# Patient Record
Sex: Female | Born: 1960 | Race: White
Health system: Southern US, Academic
[De-identification: ages and names within clinical notes are randomized; demographics above are authoritative.]

## PROBLEM LIST (undated history)

## (undated) ENCOUNTER — Emergency Department (HOSPITAL_COMMUNITY): Admission: EM

## (undated) DIAGNOSIS — J432 Centrilobular emphysema: Secondary | ICD-10-CM

## (undated) DIAGNOSIS — J45909 Unspecified asthma, uncomplicated: Secondary | ICD-10-CM

## (undated) DIAGNOSIS — E119 Type 2 diabetes mellitus without complications: Secondary | ICD-10-CM

## (undated) DIAGNOSIS — R0602 Shortness of breath: Secondary | ICD-10-CM

## (undated) DIAGNOSIS — E039 Hypothyroidism, unspecified: Secondary | ICD-10-CM

## (undated) DIAGNOSIS — D069 Carcinoma in situ of cervix, unspecified: Secondary | ICD-10-CM

## (undated) DIAGNOSIS — I10 Essential (primary) hypertension: Secondary | ICD-10-CM

## (undated) DIAGNOSIS — J449 Chronic obstructive pulmonary disease, unspecified: Secondary | ICD-10-CM

## (undated) DIAGNOSIS — G4733 Obstructive sleep apnea (adult) (pediatric): Secondary | ICD-10-CM

## (undated) DIAGNOSIS — J9611 Chronic respiratory failure with hypoxia: Secondary | ICD-10-CM

## (undated) DIAGNOSIS — IMO0001 Reserved for inherently not codable concepts without codable children: Secondary | ICD-10-CM

## (undated) DIAGNOSIS — G709 Myoneural disorder, unspecified: Secondary | ICD-10-CM

## (undated) DIAGNOSIS — Z972 Presence of dental prosthetic device (complete) (partial): Secondary | ICD-10-CM

## (undated) DIAGNOSIS — F419 Anxiety disorder, unspecified: Secondary | ICD-10-CM

## (undated) DIAGNOSIS — K219 Gastro-esophageal reflux disease without esophagitis: Secondary | ICD-10-CM

## (undated) DIAGNOSIS — G4730 Sleep apnea, unspecified: Secondary | ICD-10-CM

## (undated) HISTORY — PX: NECK SURGERY: SHX720

## (undated) HISTORY — PX: HX PELVIC LAPAROSCOPY: SHX162

## (undated) HISTORY — DX: Hypothyroidism, unspecified: E03.9

## (undated) HISTORY — DX: Carcinoma in situ of cervix, unspecified: D06.9

## (undated) HISTORY — PX: HX TONSILLECTOMY: SHX27

## (undated) HISTORY — DX: Centrilobular emphysema: J43.2

## (undated) HISTORY — DX: Essential (primary) hypertension: I10

## (undated) HISTORY — DX: Unspecified asthma, uncomplicated: J45.909

## (undated) HISTORY — PX: HX HYSTERECTOMY: SHX81

## (undated) HISTORY — DX: Chronic respiratory failure with hypoxia: J96.11

## (undated) HISTORY — DX: Type 2 diabetes mellitus without complications: E11.9

## (undated) HISTORY — DX: Type 2 diabetes mellitus without complications (CMS HCC): E11.9

## (undated) HISTORY — DX: Shortness of breath: R06.02

## (undated) HISTORY — PX: TUBOPLASTY / TUBOTUBAL ANASTOMOSIS: SUR1392

## (undated) HISTORY — DX: Obstructive sleep apnea (adult) (pediatric): G47.33

## (undated) HISTORY — PX: ULNAR NERVE REPAIR: SHX2594

## (undated) HISTORY — PX: HX TUBAL LIGATION: SHX77

## (undated) HISTORY — PX: LEG SURGERY: SHX1003

## (undated) HISTORY — PX: TUBAL LIGATION: SHX77

## (undated) HISTORY — PX: TONSILLECTOMY: SUR1361

## (undated) HISTORY — DX: Anxiety disorder, unspecified: F41.9

## (undated) HISTORY — PX: OTHER SURGICAL HISTORY: SHX169

---

## 2000-10-26 ENCOUNTER — Other Ambulatory Visit: Admission: RE | Admit: 2000-10-26 | Discharge: 2000-10-26 | Payer: Self-pay | Admitting: Family Medicine

## 2011-06-01 DIAGNOSIS — Q828 Other specified congenital malformations of skin: Secondary | ICD-10-CM | POA: Insufficient documentation

## 2011-06-08 DIAGNOSIS — N812 Incomplete uterovaginal prolapse: Secondary | ICD-10-CM | POA: Insufficient documentation

## 2011-06-11 DIAGNOSIS — R32 Unspecified urinary incontinence: Secondary | ICD-10-CM | POA: Insufficient documentation

## 2011-08-20 DIAGNOSIS — G5620 Lesion of ulnar nerve, unspecified upper limb: Secondary | ICD-10-CM | POA: Insufficient documentation

## 2013-08-13 DIAGNOSIS — Z87891 Personal history of nicotine dependence: Secondary | ICD-10-CM | POA: Insufficient documentation

## 2013-08-13 DIAGNOSIS — M19079 Primary osteoarthritis, unspecified ankle and foot: Secondary | ICD-10-CM | POA: Insufficient documentation

## 2013-08-24 ENCOUNTER — Ambulatory Visit: Payer: Self-pay | Admitting: Orthopaedic Surgery

## 2013-09-11 ENCOUNTER — Ambulatory Visit: Payer: Self-pay | Admitting: Orthopaedic Surgery

## 2013-11-26 ENCOUNTER — Ambulatory Visit: Payer: Self-pay | Admitting: Family Medicine

## 2014-01-21 DIAGNOSIS — M86679 Other chronic osteomyelitis, unspecified ankle and foot: Secondary | ICD-10-CM | POA: Insufficient documentation

## 2014-04-02 ENCOUNTER — Emergency Department: Payer: Self-pay | Admitting: Emergency Medicine

## 2014-04-02 LAB — BASIC METABOLIC PANEL
Anion Gap: 5 — ABNORMAL LOW (ref 7–16)
BUN: 14 mg/dL (ref 7–18)
CHLORIDE: 107 mmol/L (ref 98–107)
CO2: 32 mmol/L (ref 21–32)
Calcium, Total: 8.7 mg/dL (ref 8.5–10.1)
Creatinine: 0.93 mg/dL (ref 0.60–1.30)
EGFR (African American): >60
GLUCOSE: 118 mg/dL — AB (ref 65–99)
OSMOLALITY: 288 (ref 275–301)
POTASSIUM: 3.8 mmol/L (ref 3.5–5.1)
Sodium: 144 mmol/L (ref 136–145)

## 2014-04-02 LAB — PRO B NATRIURETIC PEPTIDE: B-TYPE NATIURETIC PEPTID: 67 pg/mL (ref 0–125)

## 2014-04-02 LAB — CBC
HCT: 36.9 % (ref 35.0–47.0)
HGB: 12 g/dL (ref 12.0–16.0)
MCH: 28.1 pg (ref 26.0–34.0)
MCHC: 32.6 g/dL (ref 32.0–36.0)
MCV: 87 fL (ref 80–100)
Platelet: 154 10*3/uL (ref 150–440)
RBC: 4.27 10*6/uL (ref 3.80–5.20)
RDW: 15.8 % — ABNORMAL HIGH (ref 11.5–14.5)
WBC: 8.9 10*3/uL (ref 3.6–11.0)

## 2014-04-02 LAB — TROPONIN I: Troponin-I: <0.02 ng/mL

## 2014-04-22 DIAGNOSIS — J029 Acute pharyngitis, unspecified: Secondary | ICD-10-CM | POA: Insufficient documentation

## 2014-04-22 DIAGNOSIS — R600 Localized edema: Secondary | ICD-10-CM | POA: Insufficient documentation

## 2014-04-22 DIAGNOSIS — Z8669 Personal history of other diseases of the nervous system and sense organs: Secondary | ICD-10-CM | POA: Insufficient documentation

## 2014-04-22 DIAGNOSIS — Z01818 Encounter for other preprocedural examination: Secondary | ICD-10-CM | POA: Insufficient documentation

## 2014-04-22 DIAGNOSIS — E669 Obesity, unspecified: Secondary | ICD-10-CM | POA: Insufficient documentation

## 2014-04-22 DIAGNOSIS — J4530 Mild persistent asthma, uncomplicated: Secondary | ICD-10-CM | POA: Insufficient documentation

## 2014-04-22 DIAGNOSIS — Z87891 Personal history of nicotine dependence: Secondary | ICD-10-CM | POA: Insufficient documentation

## 2014-04-23 DIAGNOSIS — Z967 Presence of other bone and tendon implants: Secondary | ICD-10-CM | POA: Insufficient documentation

## 2014-04-23 DIAGNOSIS — M86669 Other chronic osteomyelitis, unspecified tibia and fibula: Secondary | ICD-10-CM | POA: Insufficient documentation

## 2014-04-30 DIAGNOSIS — Z452 Encounter for adjustment and management of vascular access device: Secondary | ICD-10-CM | POA: Insufficient documentation

## 2014-05-23 DIAGNOSIS — R4182 Altered mental status, unspecified: Secondary | ICD-10-CM | POA: Insufficient documentation

## 2014-05-23 DIAGNOSIS — N179 Acute kidney failure, unspecified: Secondary | ICD-10-CM | POA: Insufficient documentation

## 2014-07-09 DIAGNOSIS — M25512 Pain in left shoulder: Secondary | ICD-10-CM | POA: Insufficient documentation

## 2014-08-05 DIAGNOSIS — M7541 Impingement syndrome of right shoulder: Secondary | ICD-10-CM | POA: Insufficient documentation

## 2014-08-05 DIAGNOSIS — M7542 Impingement syndrome of left shoulder: Secondary | ICD-10-CM

## 2014-08-09 ENCOUNTER — Ambulatory Visit: Payer: Self-pay

## 2014-08-16 DIAGNOSIS — G5622 Lesion of ulnar nerve, left upper limb: Secondary | ICD-10-CM | POA: Insufficient documentation

## 2014-08-19 DIAGNOSIS — M19279 Secondary osteoarthritis, unspecified ankle and foot: Secondary | ICD-10-CM | POA: Insufficient documentation

## 2014-09-30 ENCOUNTER — Ambulatory Visit: Payer: Self-pay

## 2014-11-11 DIAGNOSIS — R339 Retention of urine, unspecified: Secondary | ICD-10-CM | POA: Insufficient documentation

## 2014-11-11 DIAGNOSIS — N816 Rectocele: Secondary | ICD-10-CM | POA: Insufficient documentation

## 2014-11-11 DIAGNOSIS — N3946 Mixed incontinence: Secondary | ICD-10-CM | POA: Insufficient documentation

## 2014-11-26 ENCOUNTER — Ambulatory Visit: Payer: Medicaid Other | Attending: Specialist

## 2014-11-26 DIAGNOSIS — G4733 Obstructive sleep apnea (adult) (pediatric): Secondary | ICD-10-CM | POA: Insufficient documentation

## 2014-11-26 DIAGNOSIS — G4761 Periodic limb movement disorder: Secondary | ICD-10-CM | POA: Insufficient documentation

## 2014-11-26 DIAGNOSIS — E669 Obesity, unspecified: Secondary | ICD-10-CM | POA: Insufficient documentation

## 2014-11-26 DIAGNOSIS — R0683 Snoring: Secondary | ICD-10-CM | POA: Diagnosis present

## 2014-11-29 ENCOUNTER — Other Ambulatory Visit: Payer: Self-pay | Admitting: Family Medicine

## 2014-11-29 DIAGNOSIS — R748 Abnormal levels of other serum enzymes: Secondary | ICD-10-CM

## 2014-12-05 ENCOUNTER — Ambulatory Visit
Admission: RE | Admit: 2014-12-05 | Discharge: 2014-12-05 | Disposition: A | Discharge status: Home / Self Care | Payer: Medicaid Other | Source: Ambulatory Visit | Attending: Family Medicine | Admitting: Family Medicine

## 2014-12-05 DIAGNOSIS — K760 Fatty (change of) liver, not elsewhere classified: Secondary | ICD-10-CM | POA: Insufficient documentation

## 2014-12-05 DIAGNOSIS — R748 Abnormal levels of other serum enzymes: Secondary | ICD-10-CM | POA: Diagnosis present

## 2014-12-18 ENCOUNTER — Telehealth: Payer: Self-pay | Admitting: Family Medicine

## 2014-12-18 NOTE — Telephone Encounter (Addendum)
Pt called stating that she had an U/S done over a week ago and that she hadn't heard anything back about them. Pt was infomed that the nurse was in with a pt, but would be in touch when they had a moment. Pt stated that she really needed someone to call her back asap; she wanted to know the results before heading out to the beach.

## 2014-12-18 NOTE — Telephone Encounter (Deleted)
See below:  when they had a moment. Pt stated that she really needed someone to call her back asap; she wanted to know the results before heading out to the beach.

## 2014-12-19 NOTE — Telephone Encounter (Signed)
Left message for her to call back after lunch. U/S shoes Fatty Liver and treatment for this is diet and wt loss. Will discuss this and only this when patient calls. Anything else will need to be discussed at follow up appt.per Dr. Luan Pulling.

## 2014-12-20 NOTE — Telephone Encounter (Signed)
No answer on home phone.

## 2014-12-23 NOTE — Telephone Encounter (Signed)
Pt called for [place] results. Reported no one had called her since Jamie's initial call. Informed pt of the telephone encounter documentation of attempts to call. Reviewed [place] results. Pt has fatty liver disease. Non-life threatening. We will continue to monitor. Recommend diet and weight loss to help reduce abdominal obesity. Recommend water aerobics due to pt poor walking due to orthopedic condition.

## 2014-12-23 NOTE — Telephone Encounter (Signed)
Patient called back upset and I gave this call to Amy Krebs.

## 2014-12-30 ENCOUNTER — Ambulatory Visit: Payer: Medicaid Other | Admitting: Family Medicine

## 2015-01-14 ENCOUNTER — Other Ambulatory Visit: Payer: Self-pay | Admitting: Family Medicine

## 2015-01-14 DIAGNOSIS — M545 Low back pain, unspecified: Secondary | ICD-10-CM

## 2015-01-14 DIAGNOSIS — G8929 Other chronic pain: Secondary | ICD-10-CM

## 2015-01-14 MED ORDER — BACLOFEN 10 MG PO TABS: 10.0000 mg | ORAL_TABLET | Freq: Two times a day (BID) | ORAL | Status: DC

## 2015-01-25 ENCOUNTER — Other Ambulatory Visit: Payer: Self-pay | Admitting: Family Medicine

## 2015-02-06 ENCOUNTER — Telehealth: Payer: Self-pay | Admitting: Family Medicine

## 2015-02-06 NOTE — Telephone Encounter (Signed)
Pt needs to get an appt for colonoscopy.  Please call

## 2015-02-07 ENCOUNTER — Other Ambulatory Visit: Payer: Self-pay

## 2015-02-07 DIAGNOSIS — Z1211 Encounter for screening for malignant neoplasm of colon: Secondary | ICD-10-CM

## 2015-02-07 NOTE — Telephone Encounter (Signed)
Sent to Dr. Allen Norris as she has no preference. [facility]

## 2015-02-07 NOTE — Telephone Encounter (Signed)
Is it ok to order this or does she need to be seen? I left a message asking if there is a certain GI Doc she would like and also told her that Dr. Luan Pulling is out of town and I will ask next week if I can order this now or at next appt. [facility]

## 2015-02-07 NOTE — Telephone Encounter (Signed)
Sure. We can order a colonscopy for her. We just need to know who she wants to see.

## 2015-02-10 ENCOUNTER — Other Ambulatory Visit: Payer: Self-pay

## 2015-02-10 MED ORDER — ALBUTEROL SULFATE HFA 108 (90 BASE) MCG/ACT IN AERS: 2.0000 | INHALATION_SPRAY | Freq: Four times a day (QID) | RESPIRATORY_TRACT | Status: DC | PRN

## 2015-02-10 NOTE — Telephone Encounter (Signed)
Ok-jh 

## 2015-02-17 ENCOUNTER — Telehealth: Payer: Self-pay | Admitting: Gastroenterology

## 2015-02-17 ENCOUNTER — Other Ambulatory Visit: Payer: Self-pay

## 2015-02-17 NOTE — Telephone Encounter (Signed)
Gastroenterology Pre-Procedure Review  Request Date: 02-21-2015 Requesting Physician: Dr. Luan Pulling  PATIENT REVIEW QUESTIONS: The patient responded to the following health history questions as indicated:    1. Are you having any GI issues? no 2. Do you have a personal history of Polyps? yes ( ) 3. Do you have a family history of Colon Cancer or Polyps? yes (Polyps Mom and dad) 4. Diabetes Mellitus? no 5. Joint replacements in the past 12 months?no 6. Major health problems in the past 3 months?no 7. Any artificial heart valves, MVP, or defibrillator?no    MEDICATIONS & ALLERGIES:    Patient reports the following regarding taking any anticoagulation/antiplatelet therapy:   Plavix, Coumadin, Eliquis, Xarelto, Lovenox, Pradaxa, Brilinta, or Effient? no Aspirin? no  Patient confirms/reports the following medications:  Current Outpatient Prescriptions  Medication Sig Dispense Refill   albuterol (PROAIR HFA) 108 (90 BASE) MCG/ACT inhaler Inhale 2 puffs into the lungs every 6 (six) hours as needed for wheezing or shortness of breath. 1 Inhaler 11   baclofen (LIORESAL) 10 MG tablet Take 1 tablet (10 mg total) by mouth 2 (two) times daily. Take 1 tablet by mouth twice daily and 2 at bedtime. 120 each 0   citalopram (CELEXA) 40 MG tablet take 1 tablet by mouth once daily 30 tablet 2   gabapentin (NEURONTIN) 400 MG capsule Take by mouth.     levothyroxine (SYNTHROID, LEVOTHROID) 175 MCG tablet Take by mouth.     loratadine (CLARITIN) 10 MG tablet Take by mouth.     losartan (COZAAR) 50 MG tablet Take by mouth.     tiotropium (SPIRIVA HANDIHALER) 18 MCG inhalation capsule Place into inhaler and inhale.     triamcinolone cream (KENALOG) 0.1 % TRIAMCINOLONE ACETONIDE, 0.1% (External Cream)  1 (one) application application twice a day for 0 days  Quantity: 30;  Refills: 3   Ordered :25-Jun-2014  Theresia Majors CMA;  Started 25-Jun-2014 Active     No current facility-administered  medications for this visit.    Patient confirms/reports the following allergies:  Allergies  Allergen Reactions   Clindamycin Hives   2,4-D Dimethylamine (Amisol) Other (See Comments)    No orders of the defined types were placed in this encounter.    AUTHORIZATION INFORMATION Primary Insurance: 1D#: Group #:  Secondary Insurance: 1D#: Group #:  SCHEDULE INFORMATION: Date: 02-21-2015 Time: Location:[facility]

## 2015-02-20 NOTE — Discharge Instructions (Signed)

## 2015-02-21 ENCOUNTER — Ambulatory Visit: Payer: Medicaid Other | Admitting: Anesthesiology

## 2015-02-21 ENCOUNTER — Ambulatory Visit
Admission: RE | Admit: 2015-02-21 | Discharge: 2015-02-21 | Disposition: A | Discharge status: Home / Self Care | Payer: Medicaid Other | Source: Ambulatory Visit | Attending: Gastroenterology | Admitting: Gastroenterology

## 2015-02-21 ENCOUNTER — Other Ambulatory Visit: Payer: Self-pay | Admitting: Gastroenterology

## 2015-02-21 ENCOUNTER — Encounter
Admission: RE | Disposition: A | Discharge status: Home / Self Care | Payer: Self-pay | Source: Ambulatory Visit | Attending: Gastroenterology

## 2015-02-21 DIAGNOSIS — Z860100 Personal history of colon polyps, unspecified: Secondary | ICD-10-CM | POA: Insufficient documentation

## 2015-02-21 DIAGNOSIS — Z888 Allergy status to other drugs, medicaments and biological substances status: Secondary | ICD-10-CM | POA: Insufficient documentation

## 2015-02-21 DIAGNOSIS — G4730 Sleep apnea, unspecified: Secondary | ICD-10-CM | POA: Diagnosis not present

## 2015-02-21 DIAGNOSIS — E039 Hypothyroidism, unspecified: Secondary | ICD-10-CM | POA: Diagnosis not present

## 2015-02-21 DIAGNOSIS — K640 First degree hemorrhoids: Secondary | ICD-10-CM | POA: Insufficient documentation

## 2015-02-21 DIAGNOSIS — Z791 Long term (current) use of non-steroidal anti-inflammatories (NSAID): Secondary | ICD-10-CM | POA: Diagnosis not present

## 2015-02-21 DIAGNOSIS — Z79899 Other long term (current) drug therapy: Secondary | ICD-10-CM | POA: Insufficient documentation

## 2015-02-21 DIAGNOSIS — K219 Gastro-esophageal reflux disease without esophagitis: Secondary | ICD-10-CM | POA: Insufficient documentation

## 2015-02-21 DIAGNOSIS — K621 Rectal polyp: Secondary | ICD-10-CM | POA: Diagnosis not present

## 2015-02-21 DIAGNOSIS — Z881 Allergy status to other antibiotic agents status: Secondary | ICD-10-CM | POA: Diagnosis not present

## 2015-02-21 DIAGNOSIS — I10 Essential (primary) hypertension: Secondary | ICD-10-CM | POA: Insufficient documentation

## 2015-02-21 DIAGNOSIS — D125 Benign neoplasm of sigmoid colon: Secondary | ICD-10-CM | POA: Insufficient documentation

## 2015-02-21 DIAGNOSIS — J45909 Unspecified asthma, uncomplicated: Secondary | ICD-10-CM | POA: Diagnosis not present

## 2015-02-21 DIAGNOSIS — K635 Polyp of colon: Secondary | ICD-10-CM | POA: Diagnosis not present

## 2015-02-21 DIAGNOSIS — Z87891 Personal history of nicotine dependence: Secondary | ICD-10-CM | POA: Insufficient documentation

## 2015-02-21 HISTORY — DX: Hypothyroidism, unspecified: E03.9

## 2015-02-21 HISTORY — DX: Myoneural disorder, unspecified: G70.9

## 2015-02-21 HISTORY — PX: POLYPECTOMY: SHX5525

## 2015-02-21 HISTORY — DX: Unspecified asthma, uncomplicated: J45.909

## 2015-02-21 HISTORY — DX: Gastro-esophageal reflux disease without esophagitis: K21.9

## 2015-02-21 HISTORY — PX: COLONOSCOPY WITH PROPOFOL: SHX5780

## 2015-02-21 HISTORY — DX: Reserved for inherently not codable concepts without codable children: IMO0001

## 2015-02-21 HISTORY — DX: Essential (primary) hypertension: I10

## 2015-02-21 HISTORY — DX: Sleep apnea, unspecified: G47.30

## 2015-02-21 SURGERY — COLONOSCOPY WITH PROPOFOL
Anesthesia: Monitor Anesthesia Care | Wound class: Contaminated

## 2015-02-21 MED ORDER — PROPOFOL 10 MG/ML IV BOLUS
INTRAVENOUS | Status: DC | PRN
  Administered 2015-02-21 (×5): 20 mg via INTRAVENOUS

## 2015-02-21 MED ORDER — LIDOCAINE HCL (CARDIAC) 20 MG/ML IV SOLN
INTRAVENOUS | Status: DC | PRN
  Administered 2015-02-21: 30 mg via INTRAVENOUS

## 2015-02-21 MED ORDER — SIMETHICONE 40 MG/0.6ML PO SUSP
ORAL | Status: DC | PRN
  Administered 2015-02-21: 10:00:00

## 2015-02-21 MED ORDER — ONDANSETRON HCL 4 MG/2ML IJ SOLN: 4.0000 mg | Freq: Once | INTRAMUSCULAR | Status: DC | PRN

## 2015-02-21 MED ORDER — OXYCODONE HCL 5 MG PO TABS: 5.0000 mg | ORAL_TABLET | Freq: Once | ORAL | Status: DC | PRN

## 2015-02-21 MED ORDER — LACTATED RINGERS IV SOLN
INTRAVENOUS | Status: DC
  Administered 2015-02-21 (×2): via INTRAVENOUS

## 2015-02-21 MED ORDER — OXYCODONE HCL 5 MG/5ML PO SOLN: 5.0000 mg | Freq: Once | ORAL | Status: DC | PRN

## 2015-02-21 SURGICAL SUPPLY — 28 items
CANISTER SUCT 1200ML W/VALVE (MISCELLANEOUS) ×3 IMPLANT
FCP ESCP3.2XJMB 240X2.8X (MISCELLANEOUS)
FORCEPS BIOP RAD 4 LRG CAP 4 (CUTTING FORCEPS) IMPLANT
FORCEPS BIOP RJ4 240 W/NDL (MISCELLANEOUS)
FORCEPS ESCP3.2XJMB 240X2.8X (MISCELLANEOUS) IMPLANT
GOWN CVR UNV OPN BCK APRN NK (MISCELLANEOUS) ×4 IMPLANT
GOWN ISOL THUMB LOOP REG UNIV (MISCELLANEOUS) ×6
HEMOCLIP INSTINCT (CLIP) IMPLANT
INJECTOR VARIJECT VIN23 (MISCELLANEOUS) IMPLANT
KIT CO2 TUBING (TUBING) IMPLANT
KIT DEFENDO VALVE AND CONN (KITS) IMPLANT
KIT ENDO PROCEDURE OLY (KITS) ×3 IMPLANT
LIGATOR MULTIBAND 6SHOOTER MBL (MISCELLANEOUS) IMPLANT
MARKER SPOT ENDO TATTOO 5ML (MISCELLANEOUS) IMPLANT
PAD GROUND ADULT SPLIT (MISCELLANEOUS) IMPLANT
SNARE SHORT THROW 13M SML OVAL (MISCELLANEOUS) ×1 IMPLANT
SNARE SHORT THROW 30M LRG OVAL (MISCELLANEOUS) IMPLANT
SPOT EX ENDOSCOPIC TATTOO (MISCELLANEOUS)
SUCTION POLY TRAP 4CHAMBER (MISCELLANEOUS) IMPLANT
TRAP SUCTION POLY (MISCELLANEOUS) ×1 IMPLANT
TUBING CONN 6MMX3.1M (TUBING)
TUBING SUCTION CONN 0.25 STRL (TUBING) IMPLANT
UNDERPAD 30X60 958B10 (PK) (MISCELLANEOUS) IMPLANT
VALVE BIOPSY ENDO (VALVE) IMPLANT
VARIJECT INJECTOR VIN23 (MISCELLANEOUS)
WATER AUXILLARY (MISCELLANEOUS) IMPLANT
WATER STERILE IRR 250ML POUR (IV SOLUTION) ×3 IMPLANT
WATER STERILE IRR 500ML POUR (IV SOLUTION) IMPLANT

## 2015-02-21 NOTE — Op Note (Addendum)
[facility] Gastroenterology Patient Name: Erin Potter Procedure Date: 02/21/2015 10:15 AM MRN: [id number] Account #: [id number] Date of Birth: March 07, 1961 Admit Type: Outpatient Age: 54 Room: [facility] OR ROOM 01 Gender: Female Note Status: Supervisor Override Procedure:         Colonoscopy Indications:       High risk colon cancer surveillance: Personal history of                     colonic polyps Providers:         [name], MD Medicines:         Propofol per Anesthesia Complications:     No immediate complications. Procedure:         Pre-Anesthesia Assessment:                    - Prior to the procedure, a History and Physical was                     performed, and patient medications and allergies were                     reviewed. The patient's tolerance of previous anesthesia                     was also reviewed. The risks and benefits of the procedure                     and the sedation options and risks were discussed with the                     patient. All questions were answered, and informed consent                     was obtained. Prior Anticoagulants: The patient has taken                     no previous anticoagulant or antiplatelet agents. ASA                     Grade Assessment: II - A patient with mild systemic                     disease. After reviewing the risks and benefits, the                     patient was deemed in satisfactory condition to undergo                     the procedure.                    After obtaining informed consent, the colonoscope was                     passed under direct vision. Throughout the procedure, the                     patient's blood pressure, pulse, and oxygen saturations                     were monitored continuously. The Olympus CF H180AL                     colonoscope (S#: [id number]) was introduced through the anus  and advanced to the the cecum, identified by appendiceal                      orifice and ileocecal valve. The colonoscopy was performed                     without difficulty. The patient tolerated the procedure                     well. The quality of the bowel preparation was excellent. Findings:      The perianal and digital rectal examinations were normal.      A 7 mm polyp was found in the sigmoid colon. The polyp was sessile. The       polyp was removed with a cold snare. Resection and retrieval were       complete.      A 3 mm polyp was found in the rectum. The polyp was sessile. The polyp       was removed with a cold snare. Resection and retrieval were complete.      Non-bleeding internal hemorrhoids were found during retroflexion. The       hemorrhoids were Grade I (internal hemorrhoids that do not prolapse). Impression:        - One 7 mm polyp in the sigmoid colon. Resected and                     retrieved.                    - One 3 mm polyp in the rectum. Resected and retrieved.                    - Non-bleeding internal hemorrhoids. Recommendation:    - Await pathology results.                    - Repeat colonoscopy in 3 years for surveillance. Procedure Code(s): --- Professional ---                    [id number], Colonoscopy, flexible; with removal of tumor(s),                     polyp(s), or other lesion(s) by snare technique Diagnosis Code(s): --- Professional ---                    Z86.010, Personal history of colonic polyps                    D12.5, Benign neoplasm of sigmoid colon                    K62.1, Rectal polyp CPT copyright 2014 American Medical Association. All rights reserved. The codes documented in this report are preliminary and upon coder review may  be revised to meet current compliance requirements. [name], MD 02/21/2015 10:33:49 AM This report has been signed electronically. Number of Addenda: 0 Note Initiated On: 02/21/2015 10:15 AM Scope Withdrawal Time: 0 hours 9 minutes 6 seconds  Total  Procedure Duration: 0 hours 12 minutes 38 seconds       [facility]

## 2015-02-21 NOTE — Transfer of Care (Signed)
Immediate Anesthesia Transfer of Care Note  Patient: Erin Potter  Procedure(s) Performed: Procedure(s): COLONOSCOPY WITH PROPOFOL (N/A) POLYPECTOMY  Patient Location: PACU  Anesthesia Type: MAC  Level of Consciousness: awake, alert  and patient cooperative  Airway and Oxygen Therapy: Patient Spontanous Breathing and Patient connected to supplemental oxygen  Post-op Assessment: Post-op Vital signs reviewed, Patient's Cardiovascular Status Stable, Respiratory Function Stable, Patent Airway and No signs of Nausea or vomiting  Post-op Vital Signs: Reviewed and stable  Complications: No apparent anesthesia complications

## 2015-02-21 NOTE — Anesthesia Preprocedure Evaluation (Signed)
Anesthesia Evaluation  Patient identified by MRN, date of birth, ID band Patient awake    Reviewed: Allergy & Precautions, H&P , NPO status   Airway Mallampati: II  TM Distance: >3 FB Neck ROM: full    Dental no notable dental hx.    Pulmonary asthma , sleep apnea and Continuous Positive Airway Pressure Ventilation , former smoker,    Pulmonary exam normal       Cardiovascular hypertension, Normal cardiovascular exam    Neuro/Psych    GI/Hepatic Neg liver ROS, GERD-  Medicated,  Endo/Other  Hypothyroidism   Renal/GU      Musculoskeletal   Abdominal   Peds  Hematology negative hematology ROS (+)   Anesthesia Other Findings   Reproductive/Obstetrics                             Anesthesia Physical Anesthesia Plan  ASA: II  Anesthesia Plan: MAC   Post-op Pain Management:    Induction:   Airway Management Planned:   Additional Equipment:   Intra-op Plan:   Post-operative Plan:   Informed Consent: I have reviewed the patients History and Physical, chart, labs and discussed the procedure including the risks, benefits and alternatives for the proposed anesthesia with the patient or authorized representative who has indicated his/her understanding and acceptance.     Plan Discussed with: CRNA  Anesthesia Plan Comments:         Anesthesia Quick Evaluation

## 2015-02-21 NOTE — Anesthesia Postprocedure Evaluation (Signed)
  Anesthesia Post-op Note  Patient: Erin Potter  Procedure(s) Performed: Procedure(s): COLONOSCOPY WITH PROPOFOL (N/A) POLYPECTOMY  Anesthesia type:MAC  Patient location: PACU  Post pain: Pain level controlled  Post assessment: Post-op Vital signs reviewed, Patient's Cardiovascular Status Stable, Respiratory Function Stable, Patent Airway and No signs of Nausea or vomiting  Post vital signs: Reviewed and stable  Last Vitals:  Filed Vitals:   02/21/15 1036  BP: 142/59  Pulse: 101  Temp: 36.2 C  Resp: 20    Level of consciousness: awake, alert  and patient cooperative  Complications: No apparent anesthesia complications

## 2015-02-21 NOTE — H&P (Signed)
[facility]  [address], [place], [place] [address] Phone: [phone] Fax : [phone]  Primary Care Physician:  Dicky Doe, MD Primary Gastroenterologist:  Dr. Allen Norris  Pre-Procedure History & Physical: HPI:  Erin Potter is a 54 y.o. female is here for an colonoscopy.   Past Medical History  Diagnosis Date  . Hypertension   . Shortness of breath dyspnea   . Neuromuscular disorder     LEFT ARM WEAKNESS-ULNAR NERVE ISSUE  . Hypothyroidism   . Sleep apnea     NO CPAP-ONLY FINISHED HALF TEST  . GERD (gastroesophageal reflux disease)   . Asthma     Past Surgical History  Procedure Laterality Date  . Tonsillectomy    . Tubal ligation    . Neck surgery      EXPLORATORY  . Tubalplasty      LAPAROSCOPY  . Leg surgery Right     Prior to Admission medications   Medication Sig Start Date End Date Taking? Authorizing Provider  albuterol (PROAIR HFA) 108 (90 BASE) MCG/ACT inhaler Inhale 2 puffs into the lungs every 6 (six) hours as needed for wheezing or shortness of breath. 02/10/15  Yes Arlis Porta., MD  amitriptyline (ELAVIL) 25 MG tablet Take 12.5 mg by mouth at bedtime.   Yes Historical Provider, MD  baclofen (LIORESAL) 10 MG tablet Take 1 tablet (10 mg total) by mouth 2 (two) times daily. Take 1 tablet by mouth twice daily and 2 at bedtime. 01/14/15  Yes Amy Overton Mam, NP  benzonatate (TESSALON) 100 MG capsule Take by mouth as needed for cough.   Yes Historical Provider, MD  budesonide-formoterol (SYMBICORT) 160-4.5 MCG/ACT inhaler Inhale 2 puffs into the lungs daily.   Yes Historical Provider, MD  citalopram (CELEXA) 40 MG tablet take 1 tablet by mouth once daily 01/27/15  Yes Arlis Porta., MD  cyanocobalamin 100 MCG tablet Take 100 mcg by mouth daily.   Yes Historical Provider, MD  estrogens, conjugated, (PREMARIN) 0.625 MG tablet Take 0.625 mg by mouth 2 (two) times a week. Take daily for 21 days then do not take for 7  days.   Yes Historical Provider, MD  gabapentin (NEURONTIN) 400 MG capsule Take 400 mg by mouth 3 (three) times daily.  11/28/14  Yes Historical Provider, MD  ipratropium-albuterol (DUONEB) 0.5-2.5 (3) MG/3ML SOLN Take 3 mLs by nebulization every 2 (two) hours as needed.   Yes Historical Provider, MD  levothyroxine (SYNTHROID, LEVOTHROID) 175 MCG tablet Take 175 mcg by mouth. AM 11/28/14  Yes Historical Provider, MD  losartan (COZAAR) 50 MG tablet Take 25 mg by mouth. AM 11/28/14  Yes Historical Provider, MD  meloxicam (MOBIC) 7.5 MG tablet Take 7.5 mg by mouth daily.   Yes Historical Provider, MD  Probiotic Product (PROBIOTIC ADVANCED PO) Take by mouth.   Yes Historical Provider, MD  tiotropium (SPIRIVA HANDIHALER) 18 MCG inhalation capsule Place into inhaler and inhale. AS NEEDED 11/28/14  Yes Historical Provider, MD  triamcinolone cream (KENALOG) 0.1 % TRIAMCINOLONE ACETONIDE, 0.1% (External Cream)  1 (one) application application twice a day for 0 days  Quantity: 30;  Refills: 3   Ordered :25-Jun-2014  Theresia Majors CMA;  Started 25-Jun-2014 Active 06/25/14  Yes Historical Provider, MD    Allergies as of 02/17/2015 - never reviewed  Allergen Reaction Noted  . Clindamycin Hives 12/30/2014  . 2,4-d dimethylamine (amisol) Other (See Comments) 12/30/2014    History reviewed. No pertinent family history.  Social History  Social History  . Marital Status: Single    Spouse Name: N/A  . Number of Children: N/A  . Years of Education: N/A   Occupational History  . Not on file.   Social History Main Topics  . Smoking status: Former Smoker -- 1.50 packs/day for 35 years    Types: Cigarettes    Quit date: 02/24/2014  . Smokeless tobacco: Not on file  . Alcohol Use: No  . Drug Use: No  . Sexual Activity: Not on file   Other Topics Concern  . Not on file   Social History Narrative  . No narrative on file    Review of Systems: See HPI, otherwise negative ROS  Physical Exam: BP  134/74 mmHg  Pulse 93  Temp(Src) 98.1 F (36.7 C) (Temporal)  Resp 16  Ht 5\' 4"  (1.626 m)  Wt 212 lb (96.163 kg)  BMI 36.37 kg/m2  SpO2 96% General:   Alert,  pleasant and cooperative in NAD Head:  Normocephalic and atraumatic. Neck:  Supple; no masses or thyromegaly. Lungs:  Clear throughout to auscultation.    Heart:  Regular rate and rhythm. Abdomen:  Soft, nontender and nondistended. Normal bowel sounds, without guarding, and without rebound.   Neurologic:  Alert and  oriented x4;  grossly normal neurologically.  Impression/Plan: Erin Potter is here for an colonoscopy to be performed for history of colon polyps and family history of polyps.  Risks, benefits, limitations, and alternatives regarding  colonoscopy have been reviewed with the patient.  Questions have been answered.  All parties agreeable.   Ollen Bowl, MD  02/21/2015, 9:28 AM

## 2015-02-24 ENCOUNTER — Encounter: Payer: Self-pay | Admitting: Gastroenterology

## 2015-02-24 ENCOUNTER — Telehealth: Payer: Self-pay

## 2015-02-24 NOTE — Telephone Encounter (Signed)
Patient has called complaining that she has had yeast in mouth for several months and it will not go away. Patient is on inhalers. She is requesting Nystatin be sent to [facility] in [place]

## 2015-02-24 NOTE — Telephone Encounter (Signed)
If this has never been documented before, I would feel better about having an appt to confirm oral thrush. It could be other causes. Please let her know I would be happy to see to treat it. Thank you! AK

## 2015-02-25 NOTE — Telephone Encounter (Signed)
Please call patient and have her be seen by Amy. We have no documentation of Thrush or Yeast so she will need to be seen. [facility]

## 2015-03-03 ENCOUNTER — Encounter: Payer: Self-pay | Admitting: Gastroenterology

## 2015-03-07 ENCOUNTER — Other Ambulatory Visit: Payer: Self-pay | Admitting: Family Medicine

## 2015-03-11 ENCOUNTER — Other Ambulatory Visit: Payer: Self-pay | Admitting: Family Medicine

## 2015-03-11 ENCOUNTER — Telehealth: Payer: Self-pay

## 2015-03-11 MED ORDER — BACLOFEN 10 MG PO TABS: 10.0000 mg | ORAL_TABLET | Freq: Three times a day (TID) | ORAL | Status: DC

## 2015-03-11 NOTE — Telephone Encounter (Signed)
I will work on refill later today.-jh

## 2015-03-11 NOTE — Telephone Encounter (Signed)
Pharmacy called regarding baclofen and got denied by Amy and pt argues with pharmacy since Dr. Luan Pulling is the provider that's why it denied and refill under Dr. Luan Pulling and pharmacy needs our suggestion and I mentioned that weather it's Amy or Dr. Luan Pulling denied it's the same thing from our office please suggest ? Erin Potter

## 2015-03-13 ENCOUNTER — Telehealth: Payer: Self-pay | Admitting: Family Medicine

## 2015-03-13 NOTE — Telephone Encounter (Signed)
R/T call and ask what was wrong with her rx. Pt says her original rx was 1 tab twice daily and 2 at bedtime. According to [employer] patient is correct. Please advise.

## 2015-03-13 NOTE — Telephone Encounter (Signed)
Pt called states that med was called incorrect. Pt call back  # is  [phone] ( Baclofen)

## 2015-03-14 ENCOUNTER — Other Ambulatory Visit: Payer: Self-pay | Admitting: *Deleted

## 2015-03-14 MED ORDER — BACLOFEN 10 MG PO TABS: ORAL_TABLET | ORAL | Status: DC

## 2015-03-14 NOTE — Telephone Encounter (Signed)
Pt notified RX has been adjusted.

## 2015-03-14 NOTE — Telephone Encounter (Signed)
May change Baclofen to 10 mg., 1 twice a day and 2 at bedtime.  #120/3 refills.  Contact pharmacy with these changes-jh

## 2015-03-25 ENCOUNTER — Other Ambulatory Visit: Payer: Self-pay | Admitting: Family Medicine

## 2015-04-09 ENCOUNTER — Other Ambulatory Visit: Payer: Self-pay | Admitting: Family Medicine

## 2015-04-09 MED ORDER — MELOXICAM 15 MG PO TABS: 15.0000 mg | ORAL_TABLET | Freq: Every day | ORAL | Status: DC

## 2015-04-09 NOTE — Telephone Encounter (Signed)
Patient requested from pharmacy 04/04/15.

## 2015-04-15 ENCOUNTER — Other Ambulatory Visit: Payer: Self-pay | Admitting: Family Medicine

## 2015-04-15 MED ORDER — BUDESONIDE-FORMOTEROL FUMARATE 160-4.5 MCG/ACT IN AERO: 2.0000 | INHALATION_SPRAY | Freq: Two times a day (BID) | RESPIRATORY_TRACT | Status: DC

## 2015-04-29 ENCOUNTER — Ambulatory Visit: Payer: Medicaid Other | Admitting: Family Medicine

## 2015-05-01 ENCOUNTER — Other Ambulatory Visit: Payer: Self-pay | Admitting: Family Medicine

## 2015-05-02 MED ORDER — CITALOPRAM HYDROBROMIDE 40 MG PO TABS: 40.0000 mg | ORAL_TABLET | Freq: Every day | ORAL | Status: DC

## 2015-05-28 ENCOUNTER — Encounter: Payer: Self-pay | Admitting: Family Medicine

## 2015-05-28 ENCOUNTER — Ambulatory Visit (INDEPENDENT_AMBULATORY_CARE_PROVIDER_SITE_OTHER): Payer: Medicaid Other | Admitting: Family Medicine

## 2015-05-28 VITALS — BP 110/60 | HR 104 | Temp 99.7°F | Resp 16 | Ht 64.2 in | Wt 228.0 lb

## 2015-05-28 DIAGNOSIS — L309 Dermatitis, unspecified: Secondary | ICD-10-CM | POA: Insufficient documentation

## 2015-05-28 DIAGNOSIS — R5382 Chronic fatigue, unspecified: Secondary | ICD-10-CM | POA: Insufficient documentation

## 2015-05-28 DIAGNOSIS — J432 Centrilobular emphysema: Secondary | ICD-10-CM | POA: Insufficient documentation

## 2015-05-28 DIAGNOSIS — G629 Polyneuropathy, unspecified: Secondary | ICD-10-CM | POA: Insufficient documentation

## 2015-05-28 DIAGNOSIS — J40 Bronchitis, not specified as acute or chronic: Secondary | ICD-10-CM | POA: Diagnosis not present

## 2015-05-28 DIAGNOSIS — F32A Depression, unspecified: Secondary | ICD-10-CM

## 2015-05-28 DIAGNOSIS — F329 Major depressive disorder, single episode, unspecified: Secondary | ICD-10-CM

## 2015-05-28 DIAGNOSIS — K219 Gastro-esophageal reflux disease without esophagitis: Secondary | ICD-10-CM | POA: Diagnosis not present

## 2015-05-28 DIAGNOSIS — M5442 Lumbago with sciatica, left side: Secondary | ICD-10-CM

## 2015-05-28 DIAGNOSIS — J431 Panlobular emphysema: Secondary | ICD-10-CM

## 2015-05-28 DIAGNOSIS — E034 Atrophy of thyroid (acquired): Secondary | ICD-10-CM

## 2015-05-28 DIAGNOSIS — J449 Chronic obstructive pulmonary disease, unspecified: Secondary | ICD-10-CM

## 2015-05-28 DIAGNOSIS — M791 Myalgia, unspecified site: Secondary | ICD-10-CM

## 2015-05-28 DIAGNOSIS — R748 Abnormal levels of other serum enzymes: Secondary | ICD-10-CM

## 2015-05-28 DIAGNOSIS — E038 Other specified hypothyroidism: Secondary | ICD-10-CM | POA: Diagnosis not present

## 2015-05-28 DIAGNOSIS — L299 Pruritus, unspecified: Secondary | ICD-10-CM | POA: Diagnosis not present

## 2015-05-28 DIAGNOSIS — E039 Hypothyroidism, unspecified: Secondary | ICD-10-CM | POA: Insufficient documentation

## 2015-05-28 DIAGNOSIS — Z23 Encounter for immunization: Secondary | ICD-10-CM | POA: Diagnosis not present

## 2015-05-28 DIAGNOSIS — I10 Essential (primary) hypertension: Secondary | ICD-10-CM

## 2015-05-28 DIAGNOSIS — M869 Osteomyelitis, unspecified: Secondary | ICD-10-CM | POA: Insufficient documentation

## 2015-05-28 MED ORDER — CITALOPRAM HYDROBROMIDE 40 MG PO TABS: 40.0000 mg | ORAL_TABLET | Freq: Every day | ORAL | Status: DC

## 2015-05-28 MED ORDER — HYDROCHLOROTHIAZIDE 25 MG PO TABS: 25.0000 mg | ORAL_TABLET | Freq: Every day | ORAL | Status: DC

## 2015-05-28 NOTE — Progress Notes (Signed)
Name: Erin Potter   MRN: [id number]    DOB: 03/27/1961   Date:05/28/2015       Progress Note  Subjective  Chief Complaint  Chief Complaint  Patient presents with  . Hypertension    needs refill HCTZ  . Depression    needs refill Citalopram  . Back Pain    HPI  Here for refills of some of her meds.  To f/u HBP, depression, arthritis, chronic back pain.  C/o sores on tongue on and off.  Wants to se an oral Psychologist, sport and exercise.  She c/o RUQ abd pain.  Foul-smelling burps.  Fats not appear to worsen.  Has multiple c/o that she wants to see multiple specialists (oral surgeon, gastroenterologist, neurosurgeon for back pain , etc). No problem-specific assessment & plan notes found for this encounter.   Past Medical History  Diagnosis Date  . Hypertension   . Shortness of breath dyspnea   . Neuromuscular disorder ([place])     LEFT ARM WEAKNESS-ULNAR NERVE ISSUE  . Hypothyroidism   . Sleep apnea     NO CPAP-ONLY FINISHED HALF TEST  . GERD (gastroesophageal reflux disease)   . Asthma     Past Surgical History  Procedure Laterality Date  . Tonsillectomy    . Tubal ligation    . Neck surgery      EXPLORATORY  . Tubalplasty      LAPAROSCOPY  . Leg surgery Right   . Colonoscopy with propofol N/A 02/21/2015    Procedure: COLONOSCOPY WITH PROPOFOL;  Surgeon: [name], MD;  Location: [facility];  Service: Endoscopy;  Laterality: N/A;  . Polypectomy  02/21/2015    Procedure: POLYPECTOMY;  Surgeon: [name], MD;  Location: [facility];  Service: Endoscopy;;    History reviewed. No pertinent family history.  Social History   Social History  . Marital Status: Single    Spouse Name: N/A  . Number of Children: N/A  . Years of Education: N/A   Occupational History  . Not on file.   Social History Main Topics  . Smoking status: Former Smoker -- 1.50 packs/day for 35 years    Types: Cigarettes    Quit date: 02/24/2014  . Smokeless tobacco: Never Used  .  Alcohol Use: No  . Drug Use: No  . Sexual Activity: Not on file   Other Topics Concern  . Not on file   Social History Narrative     Current outpatient prescriptions:  .  albuterol (PROAIR HFA) 108 (90 BASE) MCG/ACT inhaler, Inhale 2 puffs into the lungs every 6 (six) hours as needed for wheezing or shortness of breath., Disp: 1 Inhaler, Rfl: 11 .  amitriptyline (ELAVIL) 25 MG tablet, Take 12.5 mg by mouth at bedtime., Disp: , Rfl:  .  baclofen (LIORESAL) 10 MG tablet, Take 2 tabs twice daily and 2 additional tabs at bedtime., Disp: 120 each, Rfl: 3 .  benzonatate (TESSALON) 100 MG capsule, Take by mouth as needed for cough., Disp: , Rfl:  .  budesonide-formoterol (SYMBICORT) 160-4.5 MCG/ACT inhaler, Inhale 2 puffs into the lungs 2 (two) times daily., Disp: 1 Inhaler, Rfl: 12 .  citalopram (CELEXA) 40 MG tablet, Take 1 tablet (40 mg total) by mouth daily. NO MORE REFILLS APPT NEEDED., Disp: 30 tablet, Rfl: 12 .  cyanocobalamin 100 MCG tablet, Take 100 mcg by mouth daily., Disp: , Rfl:  .  esomeprazole (NEXIUM) 40 MG capsule, Take 40 mg by mouth 2 (two) times daily., Disp: , Rfl:  .  estrogens, conjugated, (PREMARIN) 0.625 MG tablet, Take 0.625 mg by mouth 2 (two) times a week. Take daily for 21 days then do not take for 7 days., Disp: , Rfl:  .  gabapentin (NEURONTIN) 400 MG capsule, Take 400 mg by mouth 3 (three) times daily. , Disp: , Rfl:  .  hydrochlorothiazide (HYDRODIURIL) 25 MG tablet, Take 1 tablet (25 mg total) by mouth daily., Disp: 30 tablet, Rfl: 12 .  ipratropium-albuterol (DUONEB) 0.5-2.5 (3) MG/3ML SOLN, Take 3 mLs by nebulization every 2 (two) hours as needed., Disp: , Rfl:  .  levothyroxine (SYNTHROID, LEVOTHROID) 175 MCG tablet, Take 175 mcg by mouth. AM, Disp: , Rfl:  .  meloxicam (MOBIC) 15 MG tablet, Take 1 tablet (15 mg total) by mouth daily., Disp: 30 tablet, Rfl: 0 .  Probiotic Product (PROBIOTIC ADVANCED PO), Take by mouth., Disp: , Rfl:  .  tiotropium (SPIRIVA  HANDIHALER) 18 MCG inhalation capsule, Place into inhaler and inhale. AS NEEDED, Disp: , Rfl:  .  triamcinolone cream (KENALOG) 0.1 %, TRIAMCINOLONE ACETONIDE, 0.1% (External Cream)  1 (one) application application twice a day for 0 days  Quantity: 30;  Refills: 3   Ordered :25-Jun-2014  Theresia Majors CMA;  Started 25-Jun-2014 Active, Disp: , Rfl:   Allergies  Allergen Reactions  . Clindamycin Hives  . 2,4-D Dimethylamine (Amisol) Other (See Comments)  . Clindamycin/Lincomycin Hives  . Nyquil Multi-Symptom [Pseudoeph-Doxylamine-Dm-Apap] Hives     Review of Systems  Constitutional: Negative for fever, chills, weight loss and malaise/fatigue.  HENT: Negative for hearing loss.   Eyes: Negative for blurred vision and double vision.  Respiratory: Positive for cough (occ. dry cough), shortness of breath and wheezing. Negative for sputum production.   Cardiovascular: Positive for leg swelling. Negative for chest pain and palpitations.  Gastrointestinal: Negative for heartburn, nausea, vomiting, abdominal pain and blood in stool.  Genitourinary: Negative for dysuria, urgency and frequency.  Musculoskeletal: Negative for myalgias and joint pain.  Skin: Negative for rash.  Neurological: Negative for dizziness, tremors, weakness and headaches.  Psychiatric/Behavioral: Positive for depression. The patient is nervous/anxious and has insomnia.       Objective  Filed Vitals:   05/28/15 1442 05/28/15 1534  BP: 115/67 110/60  Pulse: 105 104  Temp: 99.7 F (37.6 C)   TempSrc: Oral   Resp: 16   Height: 5' 4.2" (1.631 m)   Weight: 228 lb (103.42 kg)     Physical Exam  Constitutional: She is oriented to person, place, and time and well-developed, well-nourished, and in no distress. No distress.  HENT:  Head: Normocephalic and atraumatic.  No lesions of mouth or tongue seen  Eyes: Conjunctivae and EOM are normal. Pupils are equal, round, and reactive to light. No scleral icterus.  Neck:  Normal range of motion. Neck supple. Carotid bruit is present (faint bilateral carotid bruits). No thyromegaly present.  Cardiovascular: Normal rate, regular rhythm and intact distal pulses.  Exam reveals no gallop and no friction rub.   Murmur heard.  Systolic murmur is present with a grade of 1/6  Throughout  Pulmonary/Chest: Effort normal and breath sounds normal. No respiratory distress. She has no wheezes. She has no rales.  Abdominal: Soft. Bowel sounds are normal. She exhibits no distension and no mass. There is no tenderness.  Musculoskeletal: She exhibits no edema.  Patient reports pain in L lower back radiating to L thigh and leg.  Lymphadenopathy:    She has no cervical adenopathy.  Neurological: She is alert and oriented  to person, place, and time.  Vitals reviewed.      No results found for this or any previous visit (from the past 2160 hour(s)).   Assessment & Plan  Problem List Items Addressed This Visit      Cardiovascular and Mediastinum   HBP (high blood pressure)   Relevant Medications   hydrochlorothiazide (HYDRODIURIL) 25 MG tablet   Other Relevant Orders   Lipid Profile   Comprehensive Metabolic Panel (CMET)     Respiratory   Bronchitis   COPD (chronic obstructive pulmonary disease) (HCC)     Digestive   GERD (gastroesophageal reflux disease)   Relevant Medications   esomeprazole (NEXIUM) 40 MG capsule   Other Relevant Orders   CBC with Differential   Comprehensive Metabolic Panel (CMET)     Endocrine   Hypothyroidism   Relevant Orders   TSH     Nervous and Auditory   Neuropathy (HCC)     Musculoskeletal and Integument   Eczema     Other   Muscle pain - Primary   Pruritus   Low back pain   Relevant Orders   Ambulatory referral to Physical Therapy   Depression   Relevant Medications   citalopram (CELEXA) 40 MG tablet   Chronic fatigue    Other Visit Diagnoses    Need for influenza vaccination        Relevant Orders    Flu  Vaccine QUAD 36+ mos PF IM (Fluarix & Fluzone Quad PF)       Meds ordered this encounter  Medications  . DISCONTD: hydrochlorothiazide (HYDRODIURIL) 25 MG tablet    Sig: Take 25 mg by mouth daily.    Refill:  0  . esomeprazole (NEXIUM) 40 MG capsule    Sig: Take 40 mg by mouth 2 (two) times daily.  . hydrochlorothiazide (HYDRODIURIL) 25 MG tablet    Sig: Take 1 tablet (25 mg total) by mouth daily.    Dispense:  30 tablet    Refill:  12  . citalopram (CELEXA) 40 MG tablet    Sig: Take 1 tablet (40 mg total) by mouth daily. NO MORE REFILLS APPT NEEDED.    Dispense:  30 tablet    Refill:  12    1. Muscle pain   2. Pruritus   3. Eczema   4. Neuropathy ([place])   5. Bronchitis   6. Essential hypertension  - Lipid Profile - Comprehensive Metabolic Panel (CMET) - hydrochlorothiazide (HYDRODIURIL) 25 MG tablet; Take 1 tablet (25 mg total) by mouth daily.  Dispense: 30 tablet; Refill: 12  7. Left-sided low back pain with left-sided sciatica  - Ambulatory referral to Physical Therapy  8. Panlobular emphysema ([place])   9. Hypothyroidism due to acquired atrophy of thyroid  - TSH  10. Gastroesophageal reflux disease without esophagitis  - CBC with Differential - Comprehensive Metabolic Panel (CMET)  11. Depression  - citalopram (CELEXA) 40 MG tablet; Take 1 tablet (40 mg total) by mouth daily. NO MORE REFILLS APPT NEEDED.  Dispense: 30 tablet; Refill: 12  12. Chronic fatigue   13. Need for influenza vaccination  - Flu Vaccine QUAD 36+ mos PF IM (Fluarix & Fluzone Quad PF)

## 2015-05-31 LAB — COMPREHENSIVE METABOLIC PANEL
A/G RATIO: 1.4 (ref 1.1–2.5)
ALT: 66 IU/L — ABNORMAL HIGH (ref 0–32)
AST: 73 IU/L — AB (ref 0–40)
Albumin: 4.1 g/dL (ref 3.5–5.5)
Alkaline Phosphatase: 98 IU/L (ref 39–117)
BUN / CREAT RATIO: 24 — AB (ref 9–23)
BUN: 18 mg/dL (ref 6–24)
Bilirubin Total: 0.4 mg/dL (ref 0.0–1.2)
CO2: 24 mmol/L (ref 18–29)
Calcium: 9.4 mg/dL (ref 8.7–10.2)
Chloride: 102 mmol/L (ref 97–106)
Creatinine, Ser: 0.75 mg/dL (ref 0.57–1.00)
GFR calc Af Amer: 105 mL/min/{1.73_m2} (ref >59)
GFR, EST NON AFRICAN AMERICAN: 91 mL/min/{1.73_m2} (ref >59)
GLOBULIN, TOTAL: 3 g/dL (ref 1.5–4.5)
Glucose: 97 mg/dL (ref 65–99)
Potassium: 4.7 mmol/L (ref 3.5–5.2)
Sodium: 144 mmol/L (ref 136–144)
Total Protein: 7.1 g/dL (ref 6.0–8.5)

## 2015-05-31 LAB — CBC WITH DIFFERENTIAL/PLATELET
BASOS: 0 %
Basophils Absolute: 0 10*3/uL (ref 0.0–0.2)
EOS (ABSOLUTE): 0.3 10*3/uL (ref 0.0–0.4)
EOS: 5 %
Hematocrit: 34.9 % (ref 34.0–46.6)
Hemoglobin: 11.3 g/dL (ref 11.1–15.9)
IMMATURE GRANULOCYTES: 0 %
Immature Grans (Abs): 0 10*3/uL (ref 0.0–0.1)
LYMPHS ABS: 2.1 10*3/uL (ref 0.7–3.1)
Lymphs: 31 %
MCH: 27 pg (ref 26.6–33.0)
MCHC: 32.4 g/dL (ref 31.5–35.7)
MCV: 83 fL (ref 79–97)
Monocytes Absolute: 0.4 10*3/uL (ref 0.1–0.9)
Monocytes: 6 %
NEUTROS PCT: 58 %
Neutrophils Absolute: 3.9 10*3/uL (ref 1.4–7.0)
Platelets: 173 10*3/uL (ref 150–379)
RBC: 4.19 x10E6/uL (ref 3.77–5.28)
RDW: 16.4 % — ABNORMAL HIGH (ref 12.3–15.4)
WBC: 6.7 10*3/uL (ref 3.4–10.8)

## 2015-05-31 LAB — LIPID PANEL
Chol/HDL Ratio: 3.9 ratio units (ref 0.0–4.4)
Cholesterol, Total: 169 mg/dL (ref 100–199)
HDL: 43 mg/dL (ref >39)
LDL CALC: 106 mg/dL — AB (ref 0–99)
Triglycerides: 102 mg/dL (ref 0–149)
VLDL Cholesterol Cal: 20 mg/dL (ref 5–40)

## 2015-05-31 LAB — TSH: TSH: 0.642 u[IU]/mL (ref 0.450–4.500)

## 2015-06-04 LAB — SPECIMEN STATUS REPORT

## 2015-06-04 LAB — HEPATITIS C ANTIBODY: Hep C Virus Ab: <0.1 s/co ratio (ref 0.0–0.9)

## 2015-06-04 NOTE — Addendum Note (Signed)
Addended by: Devona Konig on: 06/04/2015 09:04 AM   Modules accepted: Orders

## 2015-06-10 ENCOUNTER — Other Ambulatory Visit: Payer: Self-pay | Admitting: Family Medicine

## 2015-06-11 ENCOUNTER — Telehealth: Payer: Self-pay | Admitting: Family Medicine

## 2015-06-11 ENCOUNTER — Ambulatory Visit: Payer: Medicaid Other

## 2015-06-11 ENCOUNTER — Other Ambulatory Visit: Payer: Self-pay | Admitting: Family Medicine

## 2015-06-11 MED ORDER — ESOMEPRAZOLE MAGNESIUM 40 MG PO CPDR: 40.0000 mg | DELAYED_RELEASE_CAPSULE | Freq: Two times a day (BID) | ORAL | Status: DC

## 2015-06-11 NOTE — Telephone Encounter (Signed)
I have refilled.-jh 

## 2015-06-11 NOTE — Telephone Encounter (Signed)
Pt called requesting a refill on nexium 40 mg

## 2015-06-18 ENCOUNTER — Other Ambulatory Visit: Payer: Self-pay | Admitting: Family Medicine

## 2015-06-18 ENCOUNTER — Ambulatory Visit
Admission: RE | Admit: 2015-06-18 | Discharge: 2015-06-18 | Disposition: A | Discharge status: Home / Self Care | Payer: Medicaid Other | Source: Ambulatory Visit | Attending: Family Medicine | Admitting: Family Medicine

## 2015-06-18 ENCOUNTER — Ambulatory Visit: Payer: Medicaid Other | Attending: Family Medicine | Admitting: Physical Therapy

## 2015-06-18 DIAGNOSIS — R938 Abnormal findings on diagnostic imaging of other specified body structures: Secondary | ICD-10-CM | POA: Diagnosis not present

## 2015-06-18 DIAGNOSIS — R748 Abnormal levels of other serum enzymes: Secondary | ICD-10-CM

## 2015-06-18 DIAGNOSIS — R161 Splenomegaly, not elsewhere classified: Secondary | ICD-10-CM

## 2015-06-18 DIAGNOSIS — R262 Difficulty in walking, not elsewhere classified: Secondary | ICD-10-CM | POA: Insufficient documentation

## 2015-06-18 DIAGNOSIS — R531 Weakness: Secondary | ICD-10-CM

## 2015-06-18 DIAGNOSIS — M5441 Lumbago with sciatica, right side: Secondary | ICD-10-CM | POA: Diagnosis not present

## 2015-06-18 DIAGNOSIS — G8929 Other chronic pain: Secondary | ICD-10-CM | POA: Diagnosis present

## 2015-06-18 DIAGNOSIS — R1084 Generalized abdominal pain: Secondary | ICD-10-CM

## 2015-06-18 NOTE — Patient Instructions (Signed)
All exercises provided were adapted from hep2go.com. Patient was provided a written handout with pictures as described. Any additional cues were manually entered in to handout and copied in to this document.   For the pool  HIP EXTENSION - STANDING  While standing, move your leg back as shown.  Use your arms for support if needed for balance and safety.    Hip Abduction  Standing tall, lift one leg out to the side then return.   For home  PRONE ON ELBOWS - POE  Lying face down, slowly press up and prop yourself up on your elbows.    Clamshell  .Lie on your side with knees bent, as pictured. .Ensure that  head, shoulders, hips, and feet are in alignment, as if you are lying against an imaginary wall .Lift knee of top leg toward the ceiling, keeping feet together .Ensure that your hips do not roll backwards - avoid rolling back into the "wall" Goal is to hold one minute with a green or blue band.  Work up to your goal using the Five Minute rule which means you will do a total of five minutes worth of holds on each leg (e.g. ten 30 second holds; 15 20 seconds; five one minute holds and so forth).     Supine bridge  Lie down on your back and bend your knees so that your feet are flat on the table. Press your heels into the ground to lift your hips off of the table. You should stabilize through your core. Return to the starting position controlling your speed.    **Have only your heels on, your toes need to be off**

## 2015-06-18 NOTE — Therapy (Signed)
[place] MAIN [facility] SERVICES [address] [place], [place], [address] Phone: [phone]   Fax:  [phone]  Physical Therapy Evaluation  Patient Details  Name: Erin Potter MRN: [id number] Date of Birth: Oct 13, 1960 No Data Recorded  Encounter Date: 06/18/2015      PT End of Session - 06/18/15 1608    Visit Number 1   Number of Visits 1   Authorization Type Medicaid Adult - 1   PT Start Time [id number]   PT Stop Time 1500   PT Time Calculation (min) 55 min   Activity Tolerance Patient tolerated treatment well   Behavior During Therapy [facility] for tasks assessed/performed      Past Medical History  Diagnosis Date  . Hypertension   . Shortness of breath dyspnea   . Neuromuscular disorder ([place])     LEFT ARM WEAKNESS-ULNAR NERVE ISSUE  . Hypothyroidism   . Sleep apnea     NO CPAP-ONLY FINISHED HALF TEST  . GERD (gastroesophageal reflux disease)   . Asthma     Past Surgical History  Procedure Laterality Date  . Tonsillectomy    . Tubal ligation    . Neck surgery      EXPLORATORY  . Tubalplasty      LAPAROSCOPY  . Leg surgery Right   . Colonoscopy with propofol N/A 02/21/2015    Procedure: COLONOSCOPY WITH PROPOFOL;  Surgeon: [name], MD;  Location: [place];  Service: Endoscopy;  Laterality: N/A;  . Polypectomy  02/21/2015    Procedure: POLYPECTOMY;  Surgeon: [name], MD;  Location: [facility];  Service: Endoscopy;;    There were no vitals filed for this visit.  Visit Diagnosis:  Chronic bilateral low back pain with right-sided sciatica - Plan: PT plan of care cert/re-cert  Generalized weakness - Plan: PT plan of care cert/re-cert  Difficulty walking - Plan: PT plan of care cert/re-cert      Subjective Assessment - 06/18/15 1610    Subjective Patient reports she fell off a ladder 5 years ago breaking "300" bones in her foot. Since that time she reports lower back pain bilaterally at roughly  her PSIS.She reports it has gotten infinitely worse over that time to the point it is now disabling and will leave her on the floor. She describes this as a burning sensation with ambulation, which is relieved with rest/sitting.    Limitations Lifting;Standing;Walking   How long can you stand comfortably? 10 minutes   How long can you walk comfortably? Roughly 5-10 minutes, though this is highly variable.    Diagnostic tests Denies any MRI or X-ray.    Patient Stated Goals To decrease her pain levels.    Currently in Pain? No/denies            [facility] PT Assessment - 06/18/15 1706    Assessment   Medical Diagnosis --  R sided sciatica   Precautions   Precautions None   Restrictions   Weight Bearing Restrictions No   Balance Screen   Has the patient fallen in the past 6 months Yes   How many times? --  At least once   Has the patient had a decrease in activity level because of a fear of falling?  Yes   [place] residence   Cognition   Overall Cognitive Status Within Functional Limits for tasks assessed   Strength   Overall Strength Comments --  No strength deficits noted on  MMT   FABER test   findings Negative   Straight Leg Raise   Findings Negative   Criag's Test    Findings Negative   Ely's Test   Findings Negative   Hip Scouring   Findings Negative   Bed Mobility   Supine to Sit 7: Independent   Sit to Supine 7: Independent       Ambulation x 200' which began to increase symptoms mildly.  Trunk flexion/extension no increase in symptoms with either  Grade I mobilizations to lumbar spine, no change in symptoms.  Grade III mobilizations to lumbar rotation with no change in symptoms.  Supine bridging x 10 repetitions, cued initially for raising her toes as she was experiencing symptoms in her lumbar spine.  Prone on elbows - no change in symptoms x 30" with cuing to let her belly sag.                     PT  Education - 06/18/15 1610    Education provided Yes   Education Details Provided HEP for both land based and pool based therapy. Provided therapeutic neuroscience education for patient on rationale for chronic pain and how exercise can assist in managing.    Person(s) Educated Patient   Methods Explanation;Demonstration;Handout;Tactile cues   Comprehension Returned demonstration;Verbalized understanding          PT Short Term Goals - 06/18/15 1608    PT SHORT TERM GOAL #1   Title Patient will be independent with a home exercise program to increase her hip strength and decrease her lower back pain symptoms.    Time 4   Period Weeks   Status New                  Plan - 06/18/15 1712    Clinical Impression Statement Patient demonstrates mild increase in pain with increased ambulation, nother other clinical tests reproduced her pain aside from bridging initially. Once cued to bias gluteal activitiy, patient reported decrease in symptoms. Patients with chronic low back pain often activate lumbar extensors rather than hip extensors due to weakness/poor motor control and this seems a contributing factor in her symptoms. PT was unable to reproduce down the leg symptoms during this session. PT would reocmmend strengthening activities and increased ambulation to decrease her symptoms.    Pt will benefit from skilled therapeutic intervention in order to improve on the following deficits Abnormal gait;Difficulty walking;Increased muscle spasms;Pain;Decreased activity tolerance;Decreased endurance;Decreased mobility;Decreased strength   Rehab Potential Fair   Clinical Impairments Affecting Rehab Potential Catastrophizing behavior, avoidance, deconditioning.    PT Frequency 1x / week   PT Treatment/Interventions Electrical Stimulation;Therapeutic exercise;Therapeutic activities;Patient/family education;Aquatic Therapy   PT Home Exercise Plan See patient instructions    Consulted and Agree  with Plan of Care Patient         Problem List Patient Active Problem List   Diagnosis Date Noted  . Muscle pain 05/28/2015  . Pruritus 05/28/2015  . Osteomyelitis ([place]) 05/28/2015  . Eczema 05/28/2015  . Neuropathy ([place]) 05/28/2015  . Bronchitis 05/28/2015  . HBP (high blood pressure) 05/28/2015  . Low back pain 05/28/2015  . COPD (chronic obstructive pulmonary disease) ([place]) 05/28/2015  . Hypothyroidism 05/28/2015  . GERD (gastroesophageal reflux disease) 05/28/2015  . Depression 05/28/2015  . Chronic fatigue 05/28/2015  . Hx of colonic polyps   . Benign neoplasm of sigmoid colon   . Rectal polyp    Kerman Passey, PT, DPT  06/18/2015, 5:18 PM  [place] MAIN [facility] SERVICES [address] [place], [place], [address] Phone: [phone]   Fax:  [phone]  Name: Erin Potter MRN: [id number] Date of Birth: 1960/07/21

## 2015-06-19 ENCOUNTER — Telehealth: Payer: Self-pay | Admitting: *Deleted

## 2015-06-19 NOTE — Telephone Encounter (Signed)
appt 06/25/2015 outpatient imaging @ 10:15 patient to arrive 10 am. NPO 4 hours prior to procedure.

## 2015-06-20 ENCOUNTER — Other Ambulatory Visit: Payer: Self-pay | Admitting: Family Medicine

## 2015-06-20 DIAGNOSIS — R1084 Generalized abdominal pain: Secondary | ICD-10-CM

## 2015-06-20 DIAGNOSIS — R748 Abnormal levels of other serum enzymes: Secondary | ICD-10-CM

## 2015-06-20 DIAGNOSIS — R161 Splenomegaly, not elsewhere classified: Secondary | ICD-10-CM

## 2015-06-25 ENCOUNTER — Ambulatory Visit: Admission: RE | Admit: 2015-06-25 | Payer: Medicaid Other | Source: Ambulatory Visit

## 2015-06-26 ENCOUNTER — Other Ambulatory Visit: Payer: Self-pay | Admitting: Family Medicine

## 2015-06-26 ENCOUNTER — Other Ambulatory Visit: Payer: Self-pay | Admitting: *Deleted

## 2015-06-26 ENCOUNTER — Telehealth: Payer: Self-pay | Admitting: Family Medicine

## 2015-06-26 DIAGNOSIS — R1084 Generalized abdominal pain: Secondary | ICD-10-CM

## 2015-06-26 DIAGNOSIS — K8020 Calculus of gallbladder without cholecystitis without obstruction: Secondary | ICD-10-CM

## 2015-06-26 NOTE — Telephone Encounter (Signed)
Erin Potter for pre cert at [facility] said pt needs a pre cert for abd [place].  She had one on the 6th  Her call back number is [phone]

## 2015-06-26 NOTE — Telephone Encounter (Signed)
Pt calling for medication refills on HCTZ and Celexa. These were ordered last office visit. Patient notified.

## 2015-06-27 ENCOUNTER — Ambulatory Visit
Admission: RE | Admit: 2015-06-27 | Discharge: 2015-06-27 | Disposition: A | Discharge status: Home / Self Care | Payer: Medicaid Other | Source: Ambulatory Visit | Attending: Family Medicine | Admitting: Family Medicine

## 2015-06-27 DIAGNOSIS — R1084 Generalized abdominal pain: Secondary | ICD-10-CM | POA: Insufficient documentation

## 2015-06-27 DIAGNOSIS — K8020 Calculus of gallbladder without cholecystitis without obstruction: Secondary | ICD-10-CM | POA: Diagnosis not present

## 2015-06-27 NOTE — Telephone Encounter (Signed)
done

## 2015-07-02 ENCOUNTER — Telehealth: Payer: Self-pay | Admitting: Family Medicine

## 2015-07-02 NOTE — Telephone Encounter (Signed)
Pt said the place that did the first part of the sleep study faxed a request for an updated order on October 18th but they did not receive the order.  They need that before they can do the second part of the study.

## 2015-07-03 NOTE — Telephone Encounter (Signed)
Called patient to discuss sleep study order. She says it has already been taken care of. Dr. Raul Del ordered the sleep study initially. Patient has already received an appt for Hematology at [facility].

## 2015-07-11 ENCOUNTER — Telehealth: Payer: Self-pay | Admitting: Family Medicine

## 2015-07-11 NOTE — Telephone Encounter (Signed)
Pt has questions about stopping losartin.  Please call [phone]

## 2015-07-15 NOTE — Telephone Encounter (Signed)
Patient has been advised that she needs an office visit to recheck bp. She will call back later.[place]

## 2015-07-15 NOTE — Telephone Encounter (Signed)
I would not recommend taking Losartan just because of HA reduction.  She would need an office appt. For recheck of BP before I would consider starting it back if BP was elevated.  That appt. Does not have to be emergent.-jh

## 2015-07-15 NOTE — Telephone Encounter (Signed)
Patient says Dr. Luan Pulling d/c her Losartan b/c bp was running low. She says she has quit smoking and had a really bad headache for 2-3 days. She started back taking Losartan and headache went away. She would like to advised on what she should do?

## 2015-07-23 ENCOUNTER — Telehealth: Payer: Self-pay | Admitting: Family Medicine

## 2015-07-23 ENCOUNTER — Other Ambulatory Visit: Payer: Self-pay | Admitting: *Deleted

## 2015-07-23 MED ORDER — BACLOFEN 10 MG PO TABS: ORAL_TABLET | ORAL | Status: DC

## 2015-07-23 NOTE — Telephone Encounter (Signed)
Pt went to pharmacy to pick up baclofen and it wasn't enough pills to last her all month taking 2 tablets 3 times daily.  She only got 120 tablets and she needs 180.

## 2015-07-23 NOTE — Telephone Encounter (Signed)
Pt said she went to appt with hematologist and they did lab work but she was told she needed an appt with liver specialist.

## 2015-07-23 NOTE — Telephone Encounter (Signed)
According to rx patient takes 6 tabs per day. Rx has been adjusted and resubmitted.[place]

## 2015-07-24 NOTE — Telephone Encounter (Signed)
Referral will be enter to Hepatology @ [facility].

## 2015-07-25 ENCOUNTER — Ambulatory Visit: Payer: Medicaid Other | Attending: Specialist

## 2015-07-25 DIAGNOSIS — G4733 Obstructive sleep apnea (adult) (pediatric): Secondary | ICD-10-CM | POA: Diagnosis not present

## 2015-07-25 DIAGNOSIS — G4761 Periodic limb movement disorder: Secondary | ICD-10-CM | POA: Insufficient documentation

## 2015-08-05 ENCOUNTER — Other Ambulatory Visit: Payer: Self-pay | Admitting: Family Medicine

## 2015-08-05 DIAGNOSIS — R1084 Generalized abdominal pain: Secondary | ICD-10-CM

## 2015-08-05 DIAGNOSIS — R9389 Abnormal findings on diagnostic imaging of other specified body structures: Secondary | ICD-10-CM

## 2015-08-12 ENCOUNTER — Telehealth: Payer: Self-pay | Admitting: Family Medicine

## 2015-08-12 NOTE — Telephone Encounter (Signed)
Pt. Called requesting  Tolterodine--- Tartrate 1mg  called in  For her  kidneys  Pt call back # is  [phone]

## 2015-08-12 NOTE — Telephone Encounter (Signed)
She would have to be seen to consider this.  Might even be a good idea to send her to urology [facility] if she wishes) to consider this med with all of her other medical problems.-jh

## 2015-08-13 NOTE — Telephone Encounter (Signed)
Spoke with patient and offered appt for tomorrow 08/14/15 with Dr. Luan Pulling.  Patient stated she will have to call back to make appt.

## 2015-08-28 ENCOUNTER — Telehealth: Payer: Self-pay | Admitting: *Deleted

## 2015-08-28 NOTE — Telephone Encounter (Signed)
Called [facility] pharmacy spoke with Larkin Ina to d/c Losartan. Per 07/11/15 phone note this was d/c'ed.

## 2015-09-19 ENCOUNTER — Telehealth: Payer: Self-pay | Admitting: Family Medicine

## 2015-09-19 NOTE — Telephone Encounter (Signed)
R/t call to patient. She says she has taken 6 BC powder and meds for arthritis. She says her head hurts so bad she feels dizzy. I told patient Dr. Luan Pulling out of the office and she would need appt before bp medication added back. She will call back to schedule.

## 2015-09-19 NOTE — Telephone Encounter (Signed)
Pt's BP is 150/101 and she has been having migraines.  Dr. Luan Pulling had stopped her BP meds.  Please call [phone]

## 2015-09-22 NOTE — Telephone Encounter (Signed)
Agree.  Need to see her.-jh

## 2015-10-13 ENCOUNTER — Other Ambulatory Visit: Payer: Self-pay | Admitting: Family Medicine

## 2015-10-13 MED ORDER — GABAPENTIN 400 MG PO CAPS: 400.0000 mg | ORAL_CAPSULE | Freq: Three times a day (TID) | ORAL | Status: DC

## 2015-10-13 MED ORDER — ESOMEPRAZOLE MAGNESIUM 40 MG PO CPDR: 40.0000 mg | DELAYED_RELEASE_CAPSULE | Freq: Two times a day (BID) | ORAL | Status: DC

## 2015-10-22 ENCOUNTER — Other Ambulatory Visit: Payer: Self-pay | Admitting: Family Medicine

## 2015-10-23 MED ORDER — BENZONATATE 100 MG PO CAPS: 100.0000 mg | ORAL_CAPSULE | Freq: Three times a day (TID) | ORAL | Status: DC | PRN

## 2015-10-28 ENCOUNTER — Other Ambulatory Visit: Payer: Self-pay | Admitting: Family Medicine

## 2015-10-28 MED ORDER — ACCU-CHEK FASTCLIX LANCETS MISC: 1.0000 | Freq: Every day | Status: DC

## 2015-11-06 ENCOUNTER — Other Ambulatory Visit: Payer: Self-pay

## 2015-11-06 ENCOUNTER — Ambulatory Visit: Payer: Medicaid Other | Admitting: Family Medicine

## 2015-11-06 ENCOUNTER — Encounter: Payer: Self-pay | Admitting: *Deleted

## 2016-01-07 ENCOUNTER — Ambulatory Visit (INDEPENDENT_AMBULATORY_CARE_PROVIDER_SITE_OTHER): Payer: Medicaid Other | Admitting: Family Medicine

## 2016-01-07 ENCOUNTER — Encounter: Payer: Self-pay | Admitting: Family Medicine

## 2016-01-07 VITALS — BP 130/70 | HR 98 | Temp 98.0°F | Resp 16 | Ht 64.4 in | Wt 223.0 lb

## 2016-01-07 DIAGNOSIS — G629 Polyneuropathy, unspecified: Secondary | ICD-10-CM

## 2016-01-07 DIAGNOSIS — M5440 Lumbago with sciatica, unspecified side: Secondary | ICD-10-CM | POA: Diagnosis not present

## 2016-01-07 DIAGNOSIS — J431 Panlobular emphysema: Secondary | ICD-10-CM | POA: Diagnosis not present

## 2016-01-07 DIAGNOSIS — K219 Gastro-esophageal reflux disease without esophagitis: Secondary | ICD-10-CM

## 2016-01-07 DIAGNOSIS — E034 Atrophy of thyroid (acquired): Secondary | ICD-10-CM

## 2016-01-07 DIAGNOSIS — R739 Hyperglycemia, unspecified: Secondary | ICD-10-CM

## 2016-01-07 DIAGNOSIS — E038 Other specified hypothyroidism: Secondary | ICD-10-CM

## 2016-01-07 DIAGNOSIS — R5382 Chronic fatigue, unspecified: Secondary | ICD-10-CM | POA: Diagnosis not present

## 2016-01-07 DIAGNOSIS — I10 Essential (primary) hypertension: Secondary | ICD-10-CM

## 2016-01-07 DIAGNOSIS — R7309 Other abnormal glucose: Secondary | ICD-10-CM | POA: Diagnosis not present

## 2016-01-07 DIAGNOSIS — F329 Major depressive disorder, single episode, unspecified: Secondary | ICD-10-CM

## 2016-01-07 DIAGNOSIS — F32A Depression, unspecified: Secondary | ICD-10-CM

## 2016-01-07 LAB — COMPLETE METABOLIC PANEL WITH GFR
ALBUMIN: 4.1 g/dL (ref 3.6–5.1)
ALK PHOS: 101 U/L (ref 33–130)
ALT: 36 U/L — AB (ref 6–29)
AST: 45 U/L — AB (ref 10–35)
BUN: 11 mg/dL (ref 7–25)
CALCIUM: 9.9 mg/dL (ref 8.6–10.4)
CHLORIDE: 100 mmol/L (ref 98–110)
CO2: 26 mmol/L (ref 20–31)
CREATININE: 0.77 mg/dL (ref 0.50–1.05)
GFR, Est African American: >89 mL/min (ref >=60)
GFR, Est Non African American: 88 mL/min (ref >=60)
GLUCOSE: 132 mg/dL — AB (ref 65–99)
Potassium: 4.6 mmol/L (ref 3.5–5.3)
Sodium: 140 mmol/L (ref 135–146)
Total Bilirubin: 0.6 mg/dL (ref 0.2–1.2)
Total Protein: 7.5 g/dL (ref 6.1–8.1)

## 2016-01-07 LAB — LIPID PANEL
CHOLESTEROL: 159 mg/dL (ref 125–200)
HDL: 42 mg/dL — ABNORMAL LOW (ref >=46)
LDL Cholesterol: 87 mg/dL (ref <130)
Total CHOL/HDL Ratio: 3.8 Ratio (ref <=5.0)
Triglycerides: 152 mg/dL — ABNORMAL HIGH (ref <150)
VLDL: 30 mg/dL (ref <30)

## 2016-01-07 LAB — CBC WITH DIFFERENTIAL/PLATELET
Basophils Absolute: 0 cells/uL (ref 0–200)
Basophils Relative: 0 %
Eosinophils Absolute: 375 cells/uL (ref 15–500)
Eosinophils Relative: 5 %
HEMATOCRIT: 36.4 % (ref 35.0–45.0)
HEMOGLOBIN: 11.9 g/dL (ref 11.7–15.5)
LYMPHS ABS: 2100 {cells}/uL (ref 850–3900)
Lymphocytes Relative: 28 %
MCH: 26.4 pg — ABNORMAL LOW (ref 27.0–33.0)
MCHC: 32.7 g/dL (ref 32.0–36.0)
MCV: 80.7 fL (ref 80.0–100.0)
MONO ABS: 600 {cells}/uL (ref 200–950)
MPV: 11.3 fL (ref 7.5–12.5)
Monocytes Relative: 8 %
NEUTROS ABS: 4425 {cells}/uL (ref 1500–7800)
Neutrophils Relative %: 59 %
Platelets: 162 10*3/uL (ref 140–400)
RBC: 4.51 MIL/uL (ref 3.80–5.10)
RDW: 16.9 % — ABNORMAL HIGH (ref 11.0–15.0)
WBC: 7.5 10*3/uL (ref 3.8–10.8)

## 2016-01-07 LAB — TSH: TSH: 2.89 m[IU]/L

## 2016-01-07 MED ORDER — MELOXICAM 15 MG PO TABS: 15.0000 mg | ORAL_TABLET | Freq: Every day | ORAL | Status: DC

## 2016-01-07 MED ORDER — LEVOTHYROXINE SODIUM 175 MCG PO TABS: 175.0000 ug | ORAL_TABLET | Freq: Every day | ORAL | Status: DC

## 2016-01-07 MED ORDER — TIOTROPIUM BROMIDE MONOHYDRATE 18 MCG IN CAPS: 18.0000 ug | ORAL_CAPSULE | Freq: Every morning | RESPIRATORY_TRACT | Status: DC

## 2016-01-07 MED ORDER — CITALOPRAM HYDROBROMIDE 40 MG PO TABS: 40.0000 mg | ORAL_TABLET | Freq: Every day | ORAL | Status: DC

## 2016-01-07 MED ORDER — FUROSEMIDE 20 MG PO TABS: 20.0000 mg | ORAL_TABLET | Freq: Every day | ORAL | Status: DC

## 2016-01-07 MED ORDER — BUDESONIDE-FORMOTEROL FUMARATE 160-4.5 MCG/ACT IN AERO: 2.0000 | INHALATION_SPRAY | Freq: Two times a day (BID) | RESPIRATORY_TRACT | Status: DC

## 2016-01-07 MED ORDER — ALBUTEROL SULFATE HFA 108 (90 BASE) MCG/ACT IN AERS: 2.0000 | INHALATION_SPRAY | Freq: Four times a day (QID) | RESPIRATORY_TRACT | Status: DC | PRN

## 2016-01-07 MED ORDER — BACLOFEN 10 MG PO TABS: ORAL_TABLET | ORAL | Status: DC

## 2016-01-07 MED ORDER — IPRATROPIUM-ALBUTEROL 0.5-2.5 (3) MG/3ML IN SOLN: 3.0000 mL | RESPIRATORY_TRACT | Status: DC | PRN

## 2016-01-07 MED ORDER — ESOMEPRAZOLE MAGNESIUM 40 MG PO CPDR: 40.0000 mg | DELAYED_RELEASE_CAPSULE | Freq: Two times a day (BID) | ORAL | Status: DC

## 2016-01-07 MED ORDER — GABAPENTIN 400 MG PO CAPS: 400.0000 mg | ORAL_CAPSULE | Freq: Three times a day (TID) | ORAL | Status: DC

## 2016-01-07 NOTE — Progress Notes (Signed)
Name: Erin Potter   MRN: [id number]    DOB: Oct 10, 1960   Date:01/07/2016       Progress Note  Subjective  Chief Complaint  Chief Complaint  Patient presents with  . Hypertension  . Back Pain    HPI Here for f/u of her multiple medical problems.  She has had elevated blood sugars to >300 on and off for past 2-3 months.  Shed is not on any med for DM.  She is taking her BP meds.  She has stopped smoking x almost 2 years.   She has back pain.  She also c/o leg swelling/  No problem-specific assessment & plan notes found for this encounter.   Past Medical History  Diagnosis Date  . Hypertension   . Shortness of breath dyspnea   . Neuromuscular disorder ([place])     LEFT ARM WEAKNESS-ULNAR NERVE ISSUE  . Hypothyroidism   . Sleep apnea     NO CPAP-ONLY FINISHED HALF TEST  . GERD (gastroesophageal reflux disease)   . Asthma     Past Surgical History  Procedure Laterality Date  . Tonsillectomy    . Tubal ligation    . Neck surgery      EXPLORATORY  . Tubalplasty      LAPAROSCOPY  . Leg surgery Right   . Colonoscopy with propofol N/A 02/21/2015    Procedure: COLONOSCOPY WITH PROPOFOL;  Surgeon: [name], MD;  Location: [place];  Service: Endoscopy;  Laterality: N/A;  . Polypectomy  02/21/2015    Procedure: POLYPECTOMY;  Surgeon: [name], MD;  Location: [place];  Service: Endoscopy;;    History reviewed. No pertinent family history.  Social History   Social History  . Marital Status: Single    Spouse Name: N/A  . Number of Children: N/A  . Years of Education: N/A   Occupational History  . Not on file.   Social History Main Topics  . Smoking status: Former Smoker -- 1.50 packs/day for 35 years    Types: Cigarettes    Quit date: 02/24/2014  . Smokeless tobacco: Never Used  . Alcohol Use: No  . Drug Use: No  . Sexual Activity: Not on file   Other Topics Concern  . Not on file   Social History Narrative     Current  outpatient prescriptions:  .  ACCU-CHEK FASTCLIX LANCETS MISC, 1 each by Does not apply route daily., Disp: 100 each, Rfl: 0 .  albuterol (PROAIR HFA) 108 (90 Base) MCG/ACT inhaler, Inhale 2 puffs into the lungs every 6 (six) hours as needed for wheezing or shortness of breath., Disp: 1 Inhaler, Rfl: 11 .  amitriptyline (ELAVIL) 25 MG tablet, Take 12.5 mg by mouth at bedtime., Disp: , Rfl:  .  baclofen (LIORESAL) 10 MG tablet, Take 1 tabs twice daily and 2 additional tabs at bedtime., Disp: 180 each, Rfl: 6 .  budesonide-formoterol (SYMBICORT) 160-4.5 MCG/ACT inhaler, Inhale 2 puffs into the lungs 2 (two) times daily., Disp: 1 Inhaler, Rfl: 12 .  citalopram (CELEXA) 40 MG tablet, Take 1 tablet (40 mg total) by mouth daily. NO MORE REFILLS APPT NEEDED., Disp: 30 tablet, Rfl: 12 .  cyanocobalamin 100 MCG tablet, Take 100 mcg by mouth daily., Disp: , Rfl:  .  esomeprazole (NEXIUM) 40 MG capsule, Take 1 capsule (40 mg total) by mouth 2 (two) times daily. Patient needs appointment in office to continue to get this medication, Disp: 60 capsule, Rfl: 12 .  estrogens, conjugated, (PREMARIN)  0.625 MG tablet, Take 0.625 mg by mouth 2 (two) times a week. Take daily for 21 days then do not take for 7 days., Disp: , Rfl:  .  gabapentin (NEURONTIN) 400 MG capsule, Take 1 capsule (400 mg total) by mouth 3 (three) times daily. Patient needs appointment at office to continue to get this medication., Disp: 90 capsule, Rfl: 12 .  ipratropium-albuterol (DUONEB) 0.5-2.5 (3) MG/3ML SOLN, Take 3 mLs by nebulization every 2 (two) hours as needed., Disp: 360 mL, Rfl: 12 .  levothyroxine (SYNTHROID, LEVOTHROID) 175 MCG tablet, Take 1 tablet (175 mcg total) by mouth daily., Disp: 30 tablet, Rfl: 12 .  meloxicam (MOBIC) 15 MG tablet, Take 1 tablet (15 mg total) by mouth daily., Disp: 30 tablet, Rfl: 6 .  Multiple Vitamin (MULTIVITAMIN) tablet, Take 1 tablet by mouth daily., Disp: , Rfl:  .  Probiotic Product (PRO-BIOTIC BLEND  PO), Take 1 capsule by mouth daily., Disp: , Rfl:  .  Probiotic Product (PROBIOTIC ADVANCED PO), Take by mouth., Disp: , Rfl:  .  tiotropium (SPIRIVA HANDIHALER) 18 MCG inhalation capsule, Place 1 capsule (18 mcg total) into inhaler and inhale every morning. AS NEEDED, Disp: 30 capsule, Rfl: 12 .  triamcinolone cream (KENALOG) 0.1 %, TRIAMCINOLONE ACETONIDE, 0.1% (External Cream)  1 (one) application application twice a day for 0 days  Quantity: 30;  Refills: 3   Ordered :25-Jun-2014  Theresia Majors CMA;  Started 25-Jun-2014 Active, Disp: , Rfl:  .  furosemide (LASIX) 20 MG tablet, Take 1 tablet (20 mg total) by mouth daily., Disp: 30 tablet, Rfl: 6  Allergies  Allergen Reactions  . Clindamycin Hives  . 2,4-D Dimethylamine (Amisol) Other (See Comments)  . Clindamycin/Lincomycin Hives  . Nyquil Multi-Symptom [Pseudoeph-Doxylamine-Dm-Apap] Hives     Review of Systems  Constitutional: Negative for fever, chills, weight loss and malaise/fatigue.  HENT: Negative for hearing loss.   Eyes: Negative for blurred vision and double vision.  Respiratory: Negative for cough, shortness of breath and wheezing.   Cardiovascular: Positive for leg swelling. Negative for chest pain and palpitations.  Gastrointestinal: Negative for heartburn, abdominal pain and blood in stool.  Genitourinary: Negative for dysuria, urgency and frequency.  Musculoskeletal: Positive for back pain.  Skin: Negative for rash.  Neurological: Negative for dizziness, tremors, weakness and headaches.      Objective  Filed Vitals:   01/07/16 1040 01/07/16 1121  BP: 138/95 130/70  Pulse: 98   Temp: 98 F (36.7 C)   TempSrc: Oral   Resp: 16   Height: 5' 4.4" (1.636 m)   Weight: 223 lb (101.152 kg)     Physical Exam  Constitutional: She is oriented to person, place, and time and well-developed, well-nourished, and in no distress. No distress.  HENT:  Head: Normocephalic and atraumatic.  Eyes: Conjunctivae and EOM are  normal. Pupils are equal, round, and reactive to light. No scleral icterus.  Neck: Normal range of motion. Neck supple. Carotid bruit is present (faint bilateral bruits). No thyromegaly present.  Cardiovascular: Normal rate, regular rhythm and normal heart sounds.  Exam reveals no gallop and no friction rub.   No murmur heard. Pulmonary/Chest: Effort normal and breath sounds normal. No respiratory distress. She has no wheezes. She has no rales.  Abdominal: Soft. She exhibits no distension and no mass. There is tenderness (Tender over upper L abd and to L ant lower rib cage.  Superficial pain).  Musculoskeletal: She exhibits edema (Traace bilateral pedal edema).  Lymphadenopathy:    She  has no cervical adenopathy.  Neurological: She is alert and oriented to person, place, and time.  Vitals reviewed.      No results found for this or any previous visit (from the past 2160 hour(s)).   Assessment & Plan  Problem List Items Addressed This Visit      Cardiovascular and Mediastinum   HBP (high blood pressure) - Primary   Relevant Medications   furosemide (LASIX) 20 MG tablet   Other Relevant Orders   COMPLETE METABOLIC PANEL WITH GFR   Lipid Profile     Respiratory   COPD (chronic obstructive pulmonary disease) (HCC)   Relevant Medications   albuterol (PROAIR HFA) 108 (90 Base) MCG/ACT inhaler   budesonide-formoterol (SYMBICORT) 160-4.5 MCG/ACT inhaler   ipratropium-albuterol (DUONEB) 0.5-2.5 (3) MG/3ML SOLN   tiotropium (SPIRIVA HANDIHALER) 18 MCG inhalation capsule     Digestive   GERD (gastroesophageal reflux disease)   Relevant Medications   Probiotic Product (PRO-BIOTIC BLEND PO)   esomeprazole (NEXIUM) 40 MG capsule   Other Relevant Orders   CBC with Differential     Endocrine   Hypothyroidism   Relevant Medications   levothyroxine (SYNTHROID, LEVOTHROID) 175 MCG tablet   Other Relevant Orders   TSH     Nervous and Auditory   Neuropathy (HCC)     Other   Low  back pain   Relevant Medications   baclofen (LIORESAL) 10 MG tablet   gabapentin (NEURONTIN) 400 MG capsule   meloxicam (MOBIC) 15 MG tablet   Depression   Relevant Medications   citalopram (CELEXA) 40 MG tablet   Chronic fatigue    Other Visit Diagnoses    Elevated blood sugar        Relevant Orders    HgB A1c       Meds ordered this encounter  Medications  . Multiple Vitamin (MULTIVITAMIN) tablet    Sig: Take 1 tablet by mouth daily.  . Probiotic Product (PRO-BIOTIC BLEND PO)    Sig: Take 1 capsule by mouth daily.  Marland Kitchen albuterol (PROAIR HFA) 108 (90 Base) MCG/ACT inhaler    Sig: Inhale 2 puffs into the lungs every 6 (six) hours as needed for wheezing or shortness of breath.    Dispense:  1 Inhaler    Refill:  11  . baclofen (LIORESAL) 10 MG tablet    Sig: Take 1 tabs twice daily and 2 additional tabs at bedtime.    Dispense:  180 each    Refill:  6  . budesonide-formoterol (SYMBICORT) 160-4.5 MCG/ACT inhaler    Sig: Inhale 2 puffs into the lungs 2 (two) times daily.    Dispense:  1 Inhaler    Refill:  12  . citalopram (CELEXA) 40 MG tablet    Sig: Take 1 tablet (40 mg total) by mouth daily. NO MORE REFILLS APPT NEEDED.    Dispense:  30 tablet    Refill:  12  . esomeprazole (NEXIUM) 40 MG capsule    Sig: Take 1 capsule (40 mg total) by mouth 2 (two) times daily. Patient needs appointment in office to continue to get this medication    Dispense:  60 capsule    Refill:  12  . gabapentin (NEURONTIN) 400 MG capsule    Sig: Take 1 capsule (400 mg total) by mouth 3 (three) times daily. Patient needs appointment at office to continue to get this medication.    Dispense:  90 capsule    Refill:  12  . ipratropium-albuterol (DUONEB) 0.5-2.5 (3) MG/3ML  SOLN    Sig: Take 3 mLs by nebulization every 2 (two) hours as needed.    Dispense:  360 mL    Refill:  12  . levothyroxine (SYNTHROID, LEVOTHROID) 175 MCG tablet    Sig: Take 1 tablet (175 mcg total) by mouth daily.     Dispense:  30 tablet    Refill:  12  . meloxicam (MOBIC) 15 MG tablet    Sig: Take 1 tablet (15 mg total) by mouth daily.    Dispense:  30 tablet    Refill:  6  . tiotropium (SPIRIVA HANDIHALER) 18 MCG inhalation capsule    Sig: Place 1 capsule (18 mcg total) into inhaler and inhale every morning. AS NEEDED    Dispense:  30 capsule    Refill:  12  . furosemide (LASIX) 20 MG tablet    Sig: Take 1 tablet (20 mg total) by mouth daily.    Dispense:  30 tablet    Refill:  6   1. Essential hypertension  - COMPLETE METABOLIC PANEL WITH GFR - Lipid Profile - furosemide (LASIX) 20 MG tablet; Take 1 tablet (20 mg total) by mouth daily.  Dispense: 30 tablet; Refill: 6  2. Panlobular emphysema (HCC)  - albuterol (PROAIR HFA) 108 (90 Base) MCG/ACT inhaler; Inhale 2 puffs into the lungs every 6 (six) hours as needed for wheezing or shortness of breath.  Dispense: 1 Inhaler; Refill: 11 - budesonide-formoterol (SYMBICORT) 160-4.5 MCG/ACT inhaler; Inhale 2 puffs into the lungs 2 (two) times daily.  Dispense: 1 Inhaler; Refill: 12 - ipratropium-albuterol (DUONEB) 0.5-2.5 (3) MG/3ML SOLN; Take 3 mLs by nebulization every 2 (two) hours as needed.  Dispense: 360 mL; Refill: 12 - tiotropium (SPIRIVA HANDIHALER) 18 MCG inhalation capsule; Place 1 capsule (18 mcg total) into inhaler and inhale every morning. AS NEEDED  Dispense: 30 capsule; Refill: 12  3. Gastroesophageal reflux disease without esophagitis  - CBC with Differential - esomeprazole (NEXIUM) 40 MG capsule; Take 1 capsule (40 mg total) by mouth 2 (two) times daily. Patient needs appointment in office to continue to get this medication  Dispense: 60 capsule; Refill: 12  4. Hypothyroidism due to acquired atrophy of thyroid  - TSH - levothyroxine (SYNTHROID, LEVOTHROID) 175 MCG tablet; Take 1 tablet (175 mcg total) by mouth daily.  Dispense: 30 tablet; Refill: 12  5. Neuropathy ([place])   6. Chronic fatigue   7. Depression  - citalopram  (CELEXA) 40 MG tablet; Take 1 tablet (40 mg total) by mouth daily. NO MORE REFILLS APPT NEEDED.  Dispense: 30 tablet; Refill: 12  8. Midline low back pain with sciatica, sciatica laterality unspecified  - baclofen (LIORESAL) 10 MG tablet; Take 1 tabs twice daily and 2 additional tabs at bedtime.  Dispense: 180 each; Refill: 6 - gabapentin (NEURONTIN) 400 MG capsule; Take 1 capsule (400 mg total) by mouth 3 (three) times daily. Patient needs appointment at office to continue to get this medication.  Dispense: 90 capsule; Refill: 12 - meloxicam (MOBIC) 15 MG tablet; Take 1 tablet (15 mg total) by mouth daily.  Dispense: 30 tablet; Refill: 6  9. Elevated blood sugar  - HgB A1c

## 2016-01-08 LAB — HEMOGLOBIN A1C
HEMOGLOBIN A1C: 7.4 % — AB (ref <5.7)
MEAN PLASMA GLUCOSE: 166 mg/dL

## 2016-01-08 MED ORDER — METFORMIN HCL 500 MG PO TABS: 500.0000 mg | ORAL_TABLET | Freq: Two times a day (BID) | ORAL | Status: DC

## 2016-01-08 NOTE — Addendum Note (Signed)
Addended by: Devona Konig on: 01/08/2016 10:43 AM   Modules accepted: Orders

## 2016-01-15 ENCOUNTER — Other Ambulatory Visit: Payer: Self-pay | Admitting: Family Medicine

## 2016-01-15 DIAGNOSIS — K219 Gastro-esophageal reflux disease without esophagitis: Secondary | ICD-10-CM

## 2016-01-15 NOTE — Telephone Encounter (Signed)
Called [facility] pharmacy to verify refills request for Nexium, Levothyroxine and Gabapentin received.[place]

## 2016-01-19 ENCOUNTER — Telehealth: Payer: Self-pay | Admitting: Family Medicine

## 2016-01-19 NOTE — Telephone Encounter (Signed)
Pt. Called states that the lasix  20mg  that she is taking she think it's causing her  Legs to swell. Pt call back # is [phone]

## 2016-01-19 NOTE — Telephone Encounter (Signed)
Lasix cannot cause leg swelling.  I will be glad to see her about this.  She will have to make an appointment.  Not urgent.-jh

## 2016-01-20 NOTE — Telephone Encounter (Signed)
Take 2 Lasix  Tabs every other day, alternating with 1 tablet every other day.-jh

## 2016-01-20 NOTE — Telephone Encounter (Signed)
Patient aware.Erin Potter 

## 2016-01-20 NOTE — Telephone Encounter (Signed)
Patient says she did not report Lasix causing her legs to swell. She says 20 mg is not decreasing the amount of swelling in her legs. Please advise.

## 2016-02-09 ENCOUNTER — Encounter: Payer: Self-pay | Admitting: Family Medicine

## 2016-02-09 ENCOUNTER — Ambulatory Visit (INDEPENDENT_AMBULATORY_CARE_PROVIDER_SITE_OTHER): Payer: Medicaid Other | Admitting: Family Medicine

## 2016-02-09 VITALS — BP 147/72 | HR 85 | Temp 98.8°F | Resp 16 | Ht 64.4 in | Wt 219.0 lb

## 2016-02-09 DIAGNOSIS — G5690 Unspecified mononeuropathy of unspecified upper limb: Secondary | ICD-10-CM | POA: Insufficient documentation

## 2016-02-09 DIAGNOSIS — Z9189 Other specified personal risk factors, not elsewhere classified: Secondary | ICD-10-CM | POA: Insufficient documentation

## 2016-02-09 DIAGNOSIS — R0600 Dyspnea, unspecified: Secondary | ICD-10-CM | POA: Insufficient documentation

## 2016-02-09 DIAGNOSIS — G2581 Restless legs syndrome: Secondary | ICD-10-CM | POA: Insufficient documentation

## 2016-02-09 DIAGNOSIS — L509 Urticaria, unspecified: Secondary | ICD-10-CM | POA: Insufficient documentation

## 2016-02-09 DIAGNOSIS — R0602 Shortness of breath: Secondary | ICD-10-CM | POA: Insufficient documentation

## 2016-02-09 DIAGNOSIS — L239 Allergic contact dermatitis, unspecified cause: Secondary | ICD-10-CM | POA: Insufficient documentation

## 2016-02-09 DIAGNOSIS — K219 Gastro-esophageal reflux disease without esophagitis: Secondary | ICD-10-CM | POA: Insufficient documentation

## 2016-02-09 DIAGNOSIS — J302 Other seasonal allergic rhinitis: Secondary | ICD-10-CM | POA: Insufficient documentation

## 2016-02-09 DIAGNOSIS — L600 Ingrowing nail: Secondary | ICD-10-CM | POA: Insufficient documentation

## 2016-02-09 DIAGNOSIS — R252 Cramp and spasm: Secondary | ICD-10-CM | POA: Insufficient documentation

## 2016-02-09 DIAGNOSIS — F17200 Nicotine dependence, unspecified, uncomplicated: Secondary | ICD-10-CM | POA: Insufficient documentation

## 2016-02-09 DIAGNOSIS — R748 Abnormal levels of other serum enzymes: Secondary | ICD-10-CM | POA: Insufficient documentation

## 2016-02-09 DIAGNOSIS — R350 Frequency of micturition: Secondary | ICD-10-CM | POA: Insufficient documentation

## 2016-02-09 DIAGNOSIS — R599 Enlarged lymph nodes, unspecified: Secondary | ICD-10-CM | POA: Insufficient documentation

## 2016-02-09 DIAGNOSIS — I10 Essential (primary) hypertension: Secondary | ICD-10-CM | POA: Diagnosis not present

## 2016-02-09 DIAGNOSIS — E1100 Type 2 diabetes mellitus with hyperosmolarity without nonketotic hyperglycemic-hyperosmolar coma (NKHHC): Secondary | ICD-10-CM

## 2016-02-09 DIAGNOSIS — R609 Edema, unspecified: Secondary | ICD-10-CM | POA: Diagnosis not present

## 2016-02-09 DIAGNOSIS — R591 Generalized enlarged lymph nodes: Secondary | ICD-10-CM | POA: Insufficient documentation

## 2016-02-09 DIAGNOSIS — E1142 Type 2 diabetes mellitus with diabetic polyneuropathy: Secondary | ICD-10-CM | POA: Insufficient documentation

## 2016-02-09 DIAGNOSIS — K1379 Other lesions of oral mucosa: Secondary | ICD-10-CM | POA: Insufficient documentation

## 2016-02-09 DIAGNOSIS — L918 Other hypertrophic disorders of the skin: Secondary | ICD-10-CM | POA: Insufficient documentation

## 2016-02-09 DIAGNOSIS — J4541 Moderate persistent asthma with (acute) exacerbation: Secondary | ICD-10-CM | POA: Insufficient documentation

## 2016-02-09 DIAGNOSIS — Z720 Tobacco use: Secondary | ICD-10-CM | POA: Insufficient documentation

## 2016-02-09 LAB — BASIC METABOLIC PANEL WITH GFR
BUN: 17 mg/dL (ref 7–25)
CO2: 27 mmol/L (ref 20–31)
Calcium: 9.5 mg/dL (ref 8.6–10.4)
Chloride: 105 mmol/L (ref 98–110)
Creat: 0.71 mg/dL (ref 0.50–1.05)
GLUCOSE: 91 mg/dL (ref 65–99)
POTASSIUM: 4.6 mmol/L (ref 3.5–5.3)
SODIUM: 142 mmol/L (ref 135–146)

## 2016-02-09 MED ORDER — FUROSEMIDE 20 MG PO TABS: ORAL_TABLET | ORAL | 6 refills | Status: DC

## 2016-02-09 NOTE — Progress Notes (Signed)
Name: Erin Potter   MRN: [id number]    DOB: 05/22/1961   Date:02/09/2016       Progress Note  Subjective  Chief Complaint  Chief Complaint  Patient presents with  . Hypertension  . Diabetes  . Edema    HPI Here for f/u of DM, pedal edema and HBP.  Her BSs range 99-200.  She only eats 1 meal a day.  Her pedal edema is improved with higher dose of Lasix (40 mg qod, alternating with 20 mg qod.).  No problem-specific Assessment & Plan notes found for this encounter.   Past Medical History:  Diagnosis Date  . Asthma   . GERD (gastroesophageal reflux disease)   . Hypertension   . Hypothyroidism   . Neuromuscular disorder ([place])    LEFT ARM WEAKNESS-ULNAR NERVE ISSUE  . Shortness of breath dyspnea   . Sleep apnea    NO CPAP-ONLY FINISHED HALF TEST    Past Surgical History:  Procedure Laterality Date  . COLONOSCOPY WITH PROPOFOL N/A 02/21/2015   Procedure: COLONOSCOPY WITH PROPOFOL;  Surgeon: [name], MD;  Location: [facility];  Service: Endoscopy;  Laterality: N/A;  . LEG SURGERY Right   . NECK SURGERY     EXPLORATORY  . POLYPECTOMY  02/21/2015   Procedure: POLYPECTOMY;  Surgeon: [name], MD;  Location: [facility];  Service: Endoscopy;;  . TONSILLECTOMY    . TUBAL LIGATION    . TUBALPLASTY     LAPAROSCOPY    History reviewed. No pertinent family history.  Social History   Social History  . Marital status: Single    Spouse name: N/A  . Number of children: N/A  . Years of education: N/A   Occupational History  . Not on file.   Social History Main Topics  . Smoking status: Former Smoker    Packs/day: 1.50    Years: 35.00    Types: Cigarettes    Quit date: 02/24/2014  . Smokeless tobacco: Never Used  . Alcohol use No  . Drug use: No  . Sexual activity: Not on file   Other Topics Concern  . Not on file   Social History Narrative  . No narrative on file     Current Outpatient Prescriptions:  .  ACCU-CHEK FASTCLIX  LANCETS MISC, 1 each by Does not apply route daily., Disp: 100 each, Rfl: 0 .  albuterol (PROAIR HFA) 108 (90 Base) MCG/ACT inhaler, Inhale 2 puffs into the lungs every 6 (six) hours as needed for wheezing or shortness of breath., Disp: 1 Inhaler, Rfl: 11 .  amitriptyline (ELAVIL) 25 MG tablet, Take 12.5 mg by mouth at bedtime., Disp: , Rfl:  .  baclofen (LIORESAL) 10 MG tablet, Take 1 tabs twice daily and 2 additional tabs at bedtime., Disp: 180 each, Rfl: 6 .  budesonide-formoterol (SYMBICORT) 160-4.5 MCG/ACT inhaler, Inhale 2 puffs into the lungs 2 (two) times daily., Disp: 1 Inhaler, Rfl: 12 .  citalopram (CELEXA) 40 MG tablet, Take 1 tablet (40 mg total) by mouth daily. NO MORE REFILLS APPT NEEDED., Disp: 30 tablet, Rfl: 12 .  conjugated estrogens (PREMARIN) vaginal cream, Place 0.625 mg vaginally once a week., Disp: , Rfl:  .  cyanocobalamin 100 MCG tablet, Take 100 mcg by mouth daily., Disp: , Rfl:  .  diclofenac sodium (VOLTAREN) 1 % GEL, Apply 1 application topically as needed., Disp: , Rfl:  .  esomeprazole (NEXIUM) 40 MG capsule, Take 1 capsule (40 mg total) by mouth 2 (two) times  daily. Patient needs appointment in office to continue to get this medication, Disp: 60 capsule, Rfl: 12 .  fluoruracil (CARAC) 0.5 % cream, Apply 0.5 mg topically daily as needed., Disp: , Rfl: 0 .  furosemide (LASIX) 20 MG tablet, Alternate taking 1, 20 mg Lasix with 2, 20 mg tabs, every other day, Disp: 45 tablet, Rfl: 6 .  gabapentin (NEURONTIN) 400 MG capsule, Take 1 capsule (400 mg total) by mouth 3 (three) times daily. Patient needs appointment at office to continue to get this medication. (Patient taking differently: Take 400 mg by mouth 3 (three) times daily. ), Disp: 90 capsule, Rfl: 12 .  ipratropium-albuterol (DUONEB) 0.5-2.5 (3) MG/3ML SOLN, Take 3 mLs by nebulization every 2 (two) hours as needed., Disp: 360 mL, Rfl: 12 .  levothyroxine (SYNTHROID, LEVOTHROID) 175 MCG tablet, Take 1 tablet (175 mcg  total) by mouth daily., Disp: 30 tablet, Rfl: 12 .  meloxicam (MOBIC) 15 MG tablet, Take 1 tablet (15 mg total) by mouth daily., Disp: 30 tablet, Rfl: 6 .  metFORMIN (GLUCOPHAGE) 500 MG tablet, Take 1 tablet (500 mg total) by mouth 2 (two) times daily with a meal., Disp: 180 tablet, Rfl: 3 .  Multiple Vitamin (MULTIVITAMIN) tablet, Take 1 tablet by mouth daily., Disp: , Rfl:  .  Probiotic Product (PRO-BIOTIC BLEND PO), Take 1 capsule by mouth daily., Disp: , Rfl:  .  tiotropium (SPIRIVA HANDIHALER) 18 MCG inhalation capsule, Place 1 capsule (18 mcg total) into inhaler and inhale every morning. AS NEEDED, Disp: 30 capsule, Rfl: 12 .  fluticasone (FLONASE) 50 MCG/ACT nasal spray, Place 2 sprays into both nostrils 3 times/day as needed-between meals & bedtime. , Disp: , Rfl:   Allergies  Allergen Reactions  . Clindamycin Hives  . 2,4-D Dimethylamine (Amisol) Other (See Comments)  . Clindamycin/Lincomycin Hives  . Nyquil Multi-Symptom [Pseudoeph-Doxylamine-Dm-Apap] Hives     Review of Systems  Constitutional: Negative for chills, fever, malaise/fatigue and weight loss.  HENT: Negative for hearing loss.   Eyes: Negative for blurred vision and double vision.  Respiratory: Negative for cough, shortness of breath and wheezing.   Cardiovascular: Negative for chest pain, palpitations and leg swelling.  Gastrointestinal: Negative for abdominal pain, blood in stool and heartburn.  Genitourinary: Negative for dysuria, frequency and urgency.  Musculoskeletal: Negative for back pain and myalgias.  Skin: Negative for rash.  Neurological: Negative for dizziness, tremors, weakness and headaches.      Objective  Vitals:   02/09/16 1010  BP: (!) 147/72  Pulse: 85  Resp: 16  Temp: 98.8 F (37.1 C)  TempSrc: Oral  Weight: 219 lb (99.3 kg)  Height: 5' 4.4" (1.636 m)    Physical Exam  Constitutional: She is oriented to person, place, and time and well-developed, well-nourished, and in no  distress. No distress.  HENT:  Head: Normocephalic and atraumatic.  Eyes: Conjunctivae and EOM are normal. Pupils are equal, round, and reactive to light. No scleral icterus.  Neck: Normal range of motion. Neck supple. Carotid bruit is not present. No thyromegaly present.  Cardiovascular: Normal rate, regular rhythm and normal heart sounds.  Exam reveals no gallop and no friction rub.   No murmur heard. Pulmonary/Chest: Effort normal and breath sounds normal. No respiratory distress. She has no wheezes. She has no rales.  Abdominal: Soft. Bowel sounds are normal. She exhibits no distension and no mass. There is no tenderness.  Musculoskeletal: She exhibits edema (trace bilateral pedal edema.).  Lymphadenopathy:    She has no cervical  adenopathy.  Neurological: She is alert and oriented to person, place, and time.  Vitals reviewed.      Recent Results (from the past 2160 hour(s))  CBC with Differential     Status: Abnormal   Collection Time: 01/07/16 11:59 AM  Result Value Ref Range   WBC 7.5 3.8 - 10.8 K/uL   RBC 4.51 3.80 - 5.10 MIL/uL   Hemoglobin 11.9 11.7 - 15.5 g/dL   HCT 36.4 35.0 - 45.0 %   MCV 80.7 80.0 - 100.0 fL   MCH 26.4 (L) 27.0 - 33.0 pg   MCHC 32.7 32.0 - 36.0 g/dL   RDW 16.9 (H) 11.0 - 15.0 %   Platelets 162 140 - 400 K/uL   MPV 11.3 7.5 - 12.5 fL   Neutro Abs 4,425 1,500 - 7,800 cells/uL   Lymphs Abs 2,100 850 - 3,900 cells/uL   Monocytes Absolute 600 200 - 950 cells/uL   Eosinophils Absolute 375 15 - 500 cells/uL   Basophils Absolute 0 0 - 200 cells/uL   Neutrophils Relative % 59 %   Lymphocytes Relative 28 %   Monocytes Relative 8 %   Eosinophils Relative 5 %   Basophils Relative 0 %   Smear Review Criteria for review not met     Comment: ** Please note change in unit of measure and reference range(s). **  COMPLETE METABOLIC PANEL WITH GFR     Status: Abnormal   Collection Time: 01/07/16 11:59 AM  Result Value Ref Range   Sodium 140 135 - 146 mmol/L    Potassium 4.6 3.5 - 5.3 mmol/L   Chloride 100 98 - 110 mmol/L   CO2 26 20 - 31 mmol/L   Glucose, Bld 132 (H) 65 - 99 mg/dL   BUN 11 7 - 25 mg/dL   Creat 0.77 0.50 - 1.05 mg/dL    Comment:   For patients > or = 55 years of age: The upper reference limit for Creatinine is approximately 13% higher for people identified as African-American.      Total Bilirubin 0.6 0.2 - 1.2 mg/dL   Alkaline Phosphatase 101 33 - 130 U/L   AST 45 (H) 10 - 35 U/L   ALT 36 (H) 6 - 29 U/L   Total Protein 7.5 6.1 - 8.1 g/dL   Albumin 4.1 3.6 - 5.1 g/dL   Calcium 9.9 8.6 - 10.4 mg/dL   GFR, Est African American >89 >=60 mL/min   GFR, Est Non African American 88 >=60 mL/min  Lipid Profile     Status: Abnormal   Collection Time: 01/07/16 11:59 AM  Result Value Ref Range   Cholesterol 159 125 - 200 mg/dL   Triglycerides 152 (H) <150 mg/dL   HDL 42 (L) >=46 mg/dL   Total CHOL/HDL Ratio 3.8 <=5.0 Ratio   VLDL 30 <30 mg/dL   LDL Cholesterol 87 <130 mg/dL    Comment:   Total Cholesterol/HDL Ratio:CHD Risk                        Coronary Heart Disease Risk Table                                        Men       Women          1/2 Average Risk  3.4        3.3              Average Risk              5.0        4.4           2X Average Risk              9.6        7.1           3X Average Risk             23.4       11.0 Use the calculated Patient Ratio above and the CHD Risk table  to determine the patient's CHD Risk.   HgB A1c     Status: Abnormal   Collection Time: 01/07/16 11:59 AM  Result Value Ref Range   Hgb A1c MFr Bld 7.4 (H) <5.7 %    Comment:   For someone without known diabetes, a hemoglobin A1c value of 6.5% or greater indicates that they may have diabetes and this should be confirmed with a follow-up test.   For someone with known diabetes, a value <7% indicates that their diabetes is well controlled and a value greater than or equal to 7% indicates suboptimal control. A1c  targets should be individualized based on duration of diabetes, age, comorbid conditions, and other considerations.   Currently, no consensus exists for use of hemoglobin A1c for diagnosis of diabetes for children.      Mean Plasma Glucose 166 mg/dL  TSH     Status: None   Collection Time: 01/07/16 11:59 AM  Result Value Ref Range   TSH 2.89 mIU/L    Comment:   Reference Range   > or = 20 Years  0.40-4.50   Pregnancy Range First trimester  0.26-2.66 Second trimester 0.55-2.73 Third trimester  0.43-2.91        Assessment & Plan  Problem List Items Addressed This Visit      Cardiovascular and Mediastinum   HBP (high blood pressure)   Relevant Medications   furosemide (LASIX) 20 MG tablet     Endocrine   Diabetes mellitus, type 2 (HCC) - Primary     Other   Edema   Relevant Medications   furosemide (LASIX) 20 MG tablet   Other Relevant Orders   BASIC METABOLIC PANEL WITH GFR    Other Visit Diagnoses   None.     Meds ordered this encounter  Medications  . diclofenac sodium (VOLTAREN) 1 % GEL    Sig: Apply 1 application topically as needed.  . fluticasone (FLONASE) 50 MCG/ACT nasal spray    Sig: Place 2 sprays into both nostrils 3 times/day as needed-between meals & bedtime.   . conjugated estrogens (PREMARIN) vaginal cream    Sig: Place 0.625 mg vaginally once a week.  Marland Kitchen DISCONTD: hydrochlorothiazide (HYDRODIURIL) 25 MG tablet    Sig: Take 25 mg by mouth daily.    Refill:  1  . fluoruracil (CARAC) 0.5 % cream    Sig: Apply 0.5 mg topically daily as needed.    Refill:  0  . furosemide (LASIX) 20 MG tablet    Sig: Alternate taking 1, 20 mg Lasix with 2, 20 mg tabs, every other day    Dispense:  45 tablet    Refill:  6   1. Essential hypertension Cont meds. 2. Type 2 diabetes mellitus with hyperosmolarity without coma, without long-term  current use of insulin (HCC) Cont Metformin 3. Edema, unspecified type  - furosemide (LASIX) 20 MG tablet;  Alternate taking 1, 20 mg Lasix with 2, 20 mg tabs, every other day  Dispense: 45 tablet; Refill: 6 - BASIC METABOLIC PANEL WITH GFR

## 2016-02-25 ENCOUNTER — Telehealth: Payer: Self-pay | Admitting: Family Medicine

## 2016-02-25 NOTE — Telephone Encounter (Signed)
Please put an order in for screening mammo.

## 2016-02-26 ENCOUNTER — Other Ambulatory Visit: Payer: Self-pay | Admitting: Family Medicine

## 2016-02-26 DIAGNOSIS — Z1239 Encounter for other screening for malignant neoplasm of breast: Secondary | ICD-10-CM

## 2016-02-26 NOTE — Telephone Encounter (Signed)
Patient aware that she may call and schedule mammogram.

## 2016-02-26 NOTE — Telephone Encounter (Signed)
OK to order screening mammogram.-jh

## 2016-04-08 ENCOUNTER — Ambulatory Visit: Payer: Medicaid Other

## 2016-04-13 ENCOUNTER — Other Ambulatory Visit: Payer: Self-pay | Admitting: Family Medicine

## 2016-04-27 LAB — HM DIABETES EYE EXAM

## 2016-04-28 ENCOUNTER — Ambulatory Visit
Admission: RE | Admit: 2016-04-28 | Discharge: 2016-04-28 | Disposition: A | Discharge status: Home / Self Care | Payer: Medicaid Other | Source: Ambulatory Visit | Attending: Family Medicine | Admitting: Family Medicine

## 2016-04-28 DIAGNOSIS — Z1231 Encounter for screening mammogram for malignant neoplasm of breast: Secondary | ICD-10-CM | POA: Diagnosis not present

## 2016-04-28 DIAGNOSIS — Z1239 Encounter for other screening for malignant neoplasm of breast: Secondary | ICD-10-CM

## 2016-04-28 DIAGNOSIS — R928 Other abnormal and inconclusive findings on diagnostic imaging of breast: Secondary | ICD-10-CM | POA: Insufficient documentation

## 2016-05-13 ENCOUNTER — Ambulatory Visit: Payer: Medicaid Other | Admitting: Family Medicine

## 2016-05-17 ENCOUNTER — Ambulatory Visit
Admission: RE | Admit: 2016-05-17 | Discharge: 2016-05-17 | Disposition: A | Discharge status: Home / Self Care | Payer: Self-pay | Source: Ambulatory Visit | Attending: *Deleted | Admitting: *Deleted

## 2016-05-17 ENCOUNTER — Other Ambulatory Visit: Payer: Self-pay | Admitting: *Deleted

## 2016-05-17 DIAGNOSIS — Z9289 Personal history of other medical treatment: Secondary | ICD-10-CM

## 2016-05-21 ENCOUNTER — Other Ambulatory Visit: Payer: Self-pay | Admitting: Family Medicine

## 2016-05-21 DIAGNOSIS — N6310 Unspecified lump in the right breast, unspecified quadrant: Secondary | ICD-10-CM

## 2016-05-21 DIAGNOSIS — N6320 Unspecified lump in the left breast, unspecified quadrant: Secondary | ICD-10-CM

## 2016-05-26 ENCOUNTER — Other Ambulatory Visit: Payer: Medicaid Other

## 2016-05-26 ENCOUNTER — Ambulatory Visit: Payer: Medicaid Other

## 2016-05-27 ENCOUNTER — Ambulatory Visit: Payer: Medicaid Other | Admitting: Family Medicine

## 2016-06-14 ENCOUNTER — Ambulatory Visit
Admission: RE | Admit: 2016-06-14 | Discharge: 2016-06-14 | Disposition: A | Discharge status: Home / Self Care | Payer: Medicaid Other | Source: Ambulatory Visit | Attending: Family Medicine | Admitting: Family Medicine

## 2016-06-14 DIAGNOSIS — N6342 Unspecified lump in left breast, subareolar: Secondary | ICD-10-CM | POA: Insufficient documentation

## 2016-06-14 DIAGNOSIS — N6311 Unspecified lump in the right breast, upper outer quadrant: Secondary | ICD-10-CM | POA: Insufficient documentation

## 2016-06-14 DIAGNOSIS — N6320 Unspecified lump in the left breast, unspecified quadrant: Secondary | ICD-10-CM

## 2016-06-14 DIAGNOSIS — N6310 Unspecified lump in the right breast, unspecified quadrant: Secondary | ICD-10-CM

## 2016-06-15 ENCOUNTER — Ambulatory Visit (INDEPENDENT_AMBULATORY_CARE_PROVIDER_SITE_OTHER): Payer: Medicaid Other | Admitting: Family Medicine

## 2016-06-15 ENCOUNTER — Encounter: Payer: Self-pay | Admitting: Family Medicine

## 2016-06-15 VITALS — BP 160/80 | HR 87 | Temp 97.4°F | Resp 16 | Ht 64.4 in | Wt 228.0 lb

## 2016-06-15 DIAGNOSIS — E1100 Type 2 diabetes mellitus with hyperosmolarity without nonketotic hyperglycemic-hyperosmolar coma (NKHHC): Secondary | ICD-10-CM

## 2016-06-15 DIAGNOSIS — E669 Obesity, unspecified: Secondary | ICD-10-CM

## 2016-06-15 DIAGNOSIS — G8929 Other chronic pain: Secondary | ICD-10-CM

## 2016-06-15 DIAGNOSIS — F418 Other specified anxiety disorders: Secondary | ICD-10-CM | POA: Diagnosis not present

## 2016-06-15 DIAGNOSIS — I10 Essential (primary) hypertension: Secondary | ICD-10-CM | POA: Diagnosis not present

## 2016-06-15 DIAGNOSIS — M5440 Lumbago with sciatica, unspecified side: Secondary | ICD-10-CM

## 2016-06-15 DIAGNOSIS — E034 Atrophy of thyroid (acquired): Secondary | ICD-10-CM | POA: Diagnosis not present

## 2016-06-15 DIAGNOSIS — J431 Panlobular emphysema: Secondary | ICD-10-CM

## 2016-06-15 MED ORDER — LOSARTAN POTASSIUM 25 MG PO TABS: 25.0000 mg | ORAL_TABLET | Freq: Every day | ORAL | 12 refills | Status: DC

## 2016-06-15 MED ORDER — AMITRIPTYLINE HCL 25 MG PO TABS: 12.5000 mg | ORAL_TABLET | Freq: Every day | ORAL | 6 refills | Status: DC

## 2016-06-15 NOTE — Patient Instructions (Signed)
Recommend that she contact Dr. Bethanne Ginger office for appt re: her edema and SOB.  Will refer to Ortho of her choice for her chronic back pain when she gets [place] the name of provider.

## 2016-06-15 NOTE — Progress Notes (Signed)
Name: Erin Potter   MRN: [id number]    DOB: 1960-09-18   Date:06/15/2016       Progress Note  Subjective  Chief Complaint  Chief Complaint  Patient presents with  . Diabetes  . Hypertension    HPI Here for f/u of HBP, DM, CHF.  She says that she does not know why she is gaining weight.  C/o SOB and pedal edema.  She wants to see someone about chronic back pain.  No sciatica.  But she wants to see someone about a second opinion.  She didn't like opinion of last evalujator.  No problem-specific Assessment & Plan notes found for this encounter.   Past Medical History:  Diagnosis Date  . Asthma   . GERD (gastroesophageal reflux disease)   . Hypertension   . Hypothyroidism   . Neuromuscular disorder ([place])    LEFT ARM WEAKNESS-ULNAR NERVE ISSUE  . Shortness of breath dyspnea   . Sleep apnea    NO CPAP-ONLY FINISHED HALF TEST    Past Surgical History:  Procedure Laterality Date  . COLONOSCOPY WITH PROPOFOL N/A 02/21/2015   Procedure: COLONOSCOPY WITH PROPOFOL;  Surgeon: [name], MD;  Location: [facility];  Service: Endoscopy;  Laterality: N/A;  . LEG SURGERY Right   . NECK SURGERY     EXPLORATORY  . POLYPECTOMY  02/21/2015   Procedure: POLYPECTOMY;  Surgeon: [name], MD;  Location: [facility];  Service: Endoscopy;;  . TONSILLECTOMY    . TUBAL LIGATION    . TUBALPLASTY     LAPAROSCOPY    Family History  Problem Relation Age of Onset  . Breast cancer Neg Hx     Social History   Social History  . Marital status: Single    Spouse name: N/A  . Number of children: N/A  . Years of education: N/A   Occupational History  . Not on file.   Social History Main Topics  . Smoking status: Former Smoker    Packs/day: 1.50    Years: 35.00    Types: Cigarettes    Quit date: 02/24/2014  . Smokeless tobacco: Never Used  . Alcohol use No  . Drug use: No  . Sexual activity: Not on file   Other Topics Concern  . Not on file   Social  History Narrative  . No narrative on file     Current Outpatient Prescriptions:  .  ACCU-CHEK FASTCLIX LANCETS MISC, use to TEST once daily, Disp: 100 each, Rfl: 12 .  ACCU-CHEK SMARTVIEW test strip, TEST 4 TIMES DAILY BEFORE MEALS AND BEFORE BEDTIME. NEEDS FOLLOW UP APPOINTMENT, Disp: 100 each, Rfl: 12 .  albuterol (PROAIR HFA) 108 (90 Base) MCG/ACT inhaler, Inhale 2 puffs into the lungs every 6 (six) hours as needed for wheezing or shortness of breath., Disp: 1 Inhaler, Rfl: 11 .  amitriptyline (ELAVIL) 25 MG tablet, Take 0.5 tablets (12.5 mg total) by mouth at bedtime., Disp: 15 tablet, Rfl: 6 .  baclofen (LIORESAL) 10 MG tablet, Take 1 tabs twice daily and 2 additional tabs at bedtime., Disp: 180 each, Rfl: 6 .  budesonide-formoterol (SYMBICORT) 160-4.5 MCG/ACT inhaler, Inhale 2 puffs into the lungs 2 (two) times daily., Disp: 1 Inhaler, Rfl: 12 .  citalopram (CELEXA) 40 MG tablet, Take 1 tablet (40 mg total) by mouth daily. NO MORE REFILLS APPT NEEDED., Disp: 30 tablet, Rfl: 12 .  cyanocobalamin 100 MCG tablet, Take 100 mcg by mouth daily., Disp: , Rfl:  .  diclofenac sodium (  VOLTAREN) 1 % GEL, Apply 1 application topically as needed., Disp: , Rfl:  .  esomeprazole (NEXIUM) 40 MG capsule, Take 1 capsule (40 mg total) by mouth 2 (two) times daily. Patient needs appointment in office to continue to get this medication, Disp: 60 capsule, Rfl: 12 .  fluoruracil (CARAC) 0.5 % cream, Apply 0.5 mg topically daily as needed., Disp: , Rfl: 0 .  furosemide (LASIX) 20 MG tablet, Alternate taking 1, 20 mg Lasix with 2, 20 mg tabs, every other day, Disp: 45 tablet, Rfl: 6 .  gabapentin (NEURONTIN) 400 MG capsule, Take 1 capsule (400 mg total) by mouth 3 (three) times daily. Patient needs appointment at office to continue to get this medication. (Patient taking differently: Take 400 mg by mouth 3 (three) times daily. ), Disp: 90 capsule, Rfl: 12 .  ipratropium-albuterol (DUONEB) 0.5-2.5 (3) MG/3ML SOLN,  Take 3 mLs by nebulization every 2 (two) hours as needed., Disp: 360 mL, Rfl: 12 .  levothyroxine (SYNTHROID, LEVOTHROID) 175 MCG tablet, Take 1 tablet (175 mcg total) by mouth daily., Disp: 30 tablet, Rfl: 12 .  meloxicam (MOBIC) 15 MG tablet, Take 1 tablet (15 mg total) by mouth daily., Disp: 30 tablet, Rfl: 6 .  metFORMIN (GLUCOPHAGE) 500 MG tablet, Take 1 tablet (500 mg total) by mouth 2 (two) times daily with a meal., Disp: 180 tablet, Rfl: 3 .  Multiple Vitamin (MULTIVITAMIN) tablet, Take 1 tablet by mouth daily., Disp: , Rfl:  .  Probiotic Product (PRO-BIOTIC BLEND PO), Take 1 capsule by mouth daily., Disp: , Rfl:  .  tiotropium (SPIRIVA HANDIHALER) 18 MCG inhalation capsule, Place 1 capsule (18 mcg total) into inhaler and inhale every morning. AS NEEDED, Disp: 30 capsule, Rfl: 12 .  losartan (COZAAR) 25 MG tablet, Take 1 tablet (25 mg total) by mouth daily., Disp: 30 tablet, Rfl: 12  Allergies  Allergen Reactions  . Clindamycin Hives  . 2,4-D Dimethylamine (Amisol) Other (See Comments)  . Clindamycin/Lincomycin Hives  . Nyquil Multi-Symptom [Pseudoeph-Doxylamine-Dm-Apap] Hives     Review of Systems  Constitutional: Positive for malaise/fatigue. Negative for chills, fever and weight loss.  HENT: Negative for hearing loss and tinnitus.   Eyes: Negative for blurred vision and double vision.  Respiratory: Positive for shortness of breath (with exercise.). Negative for cough and wheezing.   Cardiovascular: Positive for leg swelling. Negative for palpitations.  Gastrointestinal: Negative for abdominal pain, blood in stool and nausea.  Genitourinary: Negative for dysuria, frequency and urgency.  Musculoskeletal: Positive for back pain.  Skin: Negative for rash.  Neurological: Negative for dizziness, tingling, tremors, weakness and headaches.  Psychiatric/Behavioral: The patient has insomnia.       Objective  Vitals:   06/15/16 1035 06/15/16 1121  BP: (!) 162/81 (!) 160/80   Pulse: 87   Resp: 16   Temp: 97.4 F (36.3 C)   TempSrc: Oral   Weight: 228 lb (103.4 kg)   Height: 5' 4.4" (1.636 m)     Physical Exam  Constitutional: She is oriented to person, place, and time and well-developed, well-nourished, and in no distress. No distress.  HENT:  Head: Normocephalic and atraumatic.  Eyes: Conjunctivae and EOM are normal. Pupils are equal, round, and reactive to light. No scleral icterus.  Neck: Normal range of motion. Neck supple. Carotid bruit is not present. No thyromegaly present.  Cardiovascular: Normal rate, regular rhythm and normal heart sounds.  Exam reveals no gallop and no friction rub.   No murmur heard. Pulmonary/Chest: Effort normal and  breath sounds normal. No respiratory distress. She has no wheezes. She has no rales.  Abdominal: Soft. Bowel sounds are normal. She exhibits no distension, no abdominal bruit and no mass. There is no tenderness.  obese  Musculoskeletal: She exhibits edema (trace bilateral pedal edema.).  Pain in L lower back with ROM of back.  No palpable abnormality.  Lymphadenopathy:    She has no cervical adenopathy.  Neurological: She is alert and oriented to person, place, and time.  Vitals reviewed.      Recent Results (from the past 2160 hour(s))  HM DIABETES EYE EXAM     Status: None   Collection Time: 04/27/16 10:30 AM  Result Value Ref Range   HM Diabetic Eye Exam No Retinopathy No Retinopathy     Assessment & Plan  Problem List Items Addressed This Visit      Cardiovascular and Mediastinum   HBP (high blood pressure) - Primary   Relevant Medications   losartan (COZAAR) 25 MG tablet   Other Relevant Orders   COMPLETE METABOLIC PANEL WITH GFR     Respiratory   COPD (chronic obstructive pulmonary disease) (HCC)     Endocrine   Hypothyroidism   Relevant Orders   TSH   Diabetes mellitus, type 2 (HCC)   Relevant Medications   losartan (COZAAR) 25 MG tablet   Other Relevant Orders   Lipid Profile    HgB A1c     Other   Low back pain   Relevant Orders   CBC with Differential   Depression with anxiety   Relevant Medications   amitriptyline (ELAVIL) 25 MG tablet   Obesity (BMI 35.0-39.9 without comorbidity)      Meds ordered this encounter  Medications  . losartan (COZAAR) 25 MG tablet    Sig: Take 1 tablet (25 mg total) by mouth daily.    Dispense:  30 tablet    Refill:  12  . amitriptyline (ELAVIL) 25 MG tablet    Sig: Take 0.5 tablets (12.5 mg total) by mouth at bedtime.    Dispense:  15 tablet    Refill:  6   1. Essential hypertension Add Losartan 25 mg/d  2. Panlobular emphysema ([place])   3. Hypothyroidism due to acquired atrophy of thyroid   4. Type 2 diabetes mellitus with hyperosmolarity without coma, without long-term current use of insulin (HCC)   5. Obesity (BMI 35.0-39.9 without comorbidity)   6. Depression with anxiety Refill Amlodipine 25 mg., 1/2 tablet at bedtime for sleep.  7. Chronic midline low back pain with sciatica, sciatica laterality unspecified  Continue all current meds at current doses

## 2016-06-16 ENCOUNTER — Other Ambulatory Visit: Payer: Self-pay | Admitting: Family Medicine

## 2016-06-16 ENCOUNTER — Other Ambulatory Visit: Payer: Medicaid Other

## 2016-06-16 DIAGNOSIS — J40 Bronchitis, not specified as acute or chronic: Secondary | ICD-10-CM | POA: Insufficient documentation

## 2016-06-16 DIAGNOSIS — R069 Unspecified abnormalities of breathing: Secondary | ICD-10-CM | POA: Insufficient documentation

## 2016-06-16 DIAGNOSIS — R609 Edema, unspecified: Secondary | ICD-10-CM | POA: Insufficient documentation

## 2016-06-16 DIAGNOSIS — L299 Pruritus, unspecified: Secondary | ICD-10-CM | POA: Insufficient documentation

## 2016-06-16 DIAGNOSIS — K621 Rectal polyp: Secondary | ICD-10-CM | POA: Insufficient documentation

## 2016-06-16 DIAGNOSIS — M129 Arthropathy, unspecified: Secondary | ICD-10-CM | POA: Insufficient documentation

## 2016-06-16 DIAGNOSIS — Z86010 Personal history of colonic polyps: Secondary | ICD-10-CM | POA: Insufficient documentation

## 2016-06-16 DIAGNOSIS — L909 Atrophic disorder of skin, unspecified: Secondary | ICD-10-CM | POA: Insufficient documentation

## 2016-06-16 DIAGNOSIS — R059 Cough, unspecified: Secondary | ICD-10-CM | POA: Insufficient documentation

## 2016-06-16 DIAGNOSIS — M6080 Other myositis, unspecified site: Secondary | ICD-10-CM | POA: Insufficient documentation

## 2016-06-16 DIAGNOSIS — L919 Hypertrophic disorder of the skin, unspecified: Secondary | ICD-10-CM | POA: Insufficient documentation

## 2016-06-16 DIAGNOSIS — F411 Generalized anxiety disorder: Secondary | ICD-10-CM | POA: Insufficient documentation

## 2016-06-16 DIAGNOSIS — J019 Acute sinusitis, unspecified: Secondary | ICD-10-CM | POA: Insufficient documentation

## 2016-06-16 DIAGNOSIS — M6089 Other myositis, multiple sites: Secondary | ICD-10-CM | POA: Insufficient documentation

## 2016-06-16 DIAGNOSIS — G8929 Other chronic pain: Secondary | ICD-10-CM

## 2016-06-16 DIAGNOSIS — M549 Dorsalgia, unspecified: Principal | ICD-10-CM

## 2016-06-17 ENCOUNTER — Other Ambulatory Visit: Payer: Medicaid Other

## 2016-06-17 ENCOUNTER — Telehealth: Payer: Self-pay | Admitting: Family Medicine

## 2016-06-17 ENCOUNTER — Telehealth: Payer: Self-pay | Admitting: *Deleted

## 2016-06-17 LAB — CBC WITH DIFFERENTIAL/PLATELET
Basophils Absolute: 0 cells/uL (ref 0–200)
Basophils Relative: 0 %
EOS PCT: 5 %
Eosinophils Absolute: 360 cells/uL (ref 15–500)
HCT: 34 % — ABNORMAL LOW (ref 35.0–45.0)
HEMOGLOBIN: 10.6 g/dL — AB (ref 11.7–15.5)
LYMPHS ABS: 1944 {cells}/uL (ref 850–3900)
Lymphocytes Relative: 27 %
MCH: 24.7 pg — ABNORMAL LOW (ref 27.0–33.0)
MCHC: 31.2 g/dL — AB (ref 32.0–36.0)
MCV: 79.1 fL — ABNORMAL LOW (ref 80.0–100.0)
MPV: 11.2 fL (ref 7.5–12.5)
Monocytes Absolute: 576 cells/uL (ref 200–950)
Monocytes Relative: 8 %
NEUTROS ABS: 4320 {cells}/uL (ref 1500–7800)
NEUTROS PCT: 60 %
PLATELETS: 160 10*3/uL (ref 140–400)
RBC: 4.3 MIL/uL (ref 3.80–5.10)
RDW: 17.3 % — ABNORMAL HIGH (ref 11.0–15.0)
WBC: 7.2 10*3/uL (ref 3.8–10.8)

## 2016-06-17 LAB — TSH: TSH: 2.58 m[IU]/L

## 2016-06-17 LAB — COMPLETE METABOLIC PANEL WITHOUT GFR
ALT: 34 U/L — ABNORMAL HIGH (ref 6–29)
AST: 48 U/L — ABNORMAL HIGH (ref 10–35)
Albumin: 4.2 g/dL (ref 3.6–5.1)
Alkaline Phosphatase: 101 U/L (ref 33–130)
BUN: 18 mg/dL (ref 7–25)
CO2: 26 mmol/L (ref 20–31)
Calcium: 9.2 mg/dL (ref 8.6–10.4)
Chloride: 106 mmol/L (ref 98–110)
Creat: 0.79 mg/dL (ref 0.50–1.05)
GFR, Est African American: >89 mL/min
GFR, Est Non African American: 85 mL/min
Glucose, Bld: 122 mg/dL — ABNORMAL HIGH (ref 65–99)
Potassium: 4.5 mmol/L (ref 3.5–5.3)
Sodium: 144 mmol/L (ref 135–146)
Total Bilirubin: 0.6 mg/dL (ref 0.2–1.2)
Total Protein: 7.6 g/dL (ref 6.1–8.1)

## 2016-06-17 LAB — LIPID PANEL
CHOLESTEROL: 163 mg/dL (ref <200)
HDL: 43 mg/dL — ABNORMAL LOW (ref >50)
LDL Cholesterol: 94 mg/dL (ref <100)
TRIGLYCERIDES: 129 mg/dL (ref <150)
Total CHOL/HDL Ratio: 3.8 Ratio (ref <5.0)
VLDL: 26 mg/dL (ref <30)

## 2016-06-17 NOTE — Telephone Encounter (Signed)
  Dr. Ubaldo Glassing is doing cardio work-up stress test and he adjusted Lasix to bid. He did a chest xray.   [facility] in [place], [place] Not accepting new patient outside [place] area. Patient will call back with new ortho info.

## 2016-06-17 NOTE — Telephone Encounter (Signed)
Left detailed message for Erin Potter. Dr.Hawkins saw patient on 12/5 and recommended she f/u with her cardiologist re: sob and edema.

## 2016-06-17 NOTE — Telephone Encounter (Signed)
Erin Potter, at [place] with Dr. Ubaldo Glassing, asked if Dr. Luan Pulling was aware of pt's edema and shortness of breath.  Pt had appt with Dr. Ubaldo Glassing yesterday.  Her call back number is [phone]

## 2016-06-18 LAB — HEMOGLOBIN A1C
Hgb A1c MFr Bld: 6.3 % — ABNORMAL HIGH (ref <5.7)
Mean Plasma Glucose: 134 mg/dL

## 2016-06-21 ENCOUNTER — Other Ambulatory Visit: Payer: Self-pay | Admitting: *Deleted

## 2016-06-21 ENCOUNTER — Encounter: Payer: Self-pay | Admitting: *Deleted

## 2016-06-21 ENCOUNTER — Other Ambulatory Visit: Payer: Self-pay | Admitting: Family Medicine

## 2016-06-21 DIAGNOSIS — M549 Dorsalgia, unspecified: Principal | ICD-10-CM

## 2016-06-21 DIAGNOSIS — G8929 Other chronic pain: Secondary | ICD-10-CM

## 2016-06-21 DIAGNOSIS — E611 Iron deficiency: Secondary | ICD-10-CM

## 2016-06-21 NOTE — Telephone Encounter (Signed)
Ok-jh 

## 2016-06-29 DIAGNOSIS — R0789 Other chest pain: Secondary | ICD-10-CM | POA: Insufficient documentation

## 2016-07-15 ENCOUNTER — Other Ambulatory Visit: Payer: Self-pay | Admitting: Family Medicine

## 2016-07-19 ENCOUNTER — Telehealth: Payer: Self-pay | Admitting: *Deleted

## 2016-07-19 ENCOUNTER — Other Ambulatory Visit: Payer: Self-pay | Admitting: Family Medicine

## 2016-07-19 DIAGNOSIS — M5440 Lumbago with sciatica, unspecified side: Secondary | ICD-10-CM

## 2016-07-19 NOTE — Telephone Encounter (Signed)
Patient wants to know if you can send in something for the Flu. Her mother was diagnosed with the flu last week. She started feeling bad on Thursday night sore throat, fever, chills, bodyache.

## 2016-07-19 NOTE — Telephone Encounter (Signed)
Patient aware.[place] 

## 2016-07-19 NOTE — Telephone Encounter (Signed)
She probably should be checked at Urgent Care to be sure that she doesn't have Strep.  All theswe sx could be flu or strep or Mono, or other viral illness.-jh

## 2016-08-16 ENCOUNTER — Ambulatory Visit: Payer: Medicaid Other | Admitting: Family Medicine

## 2016-08-24 ENCOUNTER — Other Ambulatory Visit: Payer: Self-pay | Admitting: Family Medicine

## 2016-08-24 DIAGNOSIS — R05 Cough: Secondary | ICD-10-CM

## 2016-08-24 DIAGNOSIS — R059 Cough, unspecified: Secondary | ICD-10-CM

## 2016-08-24 MED ORDER — BENZONATATE 100 MG PO CAPS: 100.0000 mg | ORAL_CAPSULE | Freq: Two times a day (BID) | ORAL | 0 refills | Status: DC | PRN

## 2016-09-20 ENCOUNTER — Ambulatory Visit: Payer: Medicaid Other | Admitting: Family Medicine

## 2016-10-01 ENCOUNTER — Encounter: Payer: Self-pay | Admitting: Family Medicine

## 2016-10-01 ENCOUNTER — Ambulatory Visit (INDEPENDENT_AMBULATORY_CARE_PROVIDER_SITE_OTHER): Payer: Medicaid Other | Admitting: Family Medicine

## 2016-10-01 VITALS — BP 128/76 | HR 80 | Temp 98.7°F | Resp 16 | Ht 64.4 in | Wt 225.6 lb

## 2016-10-01 DIAGNOSIS — Z8349 Family history of other endocrine, nutritional and metabolic diseases: Secondary | ICD-10-CM | POA: Insufficient documentation

## 2016-10-01 DIAGNOSIS — R21 Rash and other nonspecific skin eruption: Secondary | ICD-10-CM | POA: Insufficient documentation

## 2016-10-01 DIAGNOSIS — R7989 Other specified abnormal findings of blood chemistry: Secondary | ICD-10-CM | POA: Insufficient documentation

## 2016-10-01 DIAGNOSIS — F411 Generalized anxiety disorder: Secondary | ICD-10-CM | POA: Insufficient documentation

## 2016-10-01 DIAGNOSIS — F331 Major depressive disorder, recurrent, moderate: Secondary | ICD-10-CM | POA: Insufficient documentation

## 2016-10-01 DIAGNOSIS — R5383 Other fatigue: Secondary | ICD-10-CM | POA: Insufficient documentation

## 2016-10-01 DIAGNOSIS — R945 Abnormal results of liver function studies: Secondary | ICD-10-CM

## 2016-10-01 DIAGNOSIS — N951 Menopausal and female climacteric states: Secondary | ICD-10-CM

## 2016-10-01 DIAGNOSIS — F3341 Major depressive disorder, recurrent, in partial remission: Secondary | ICD-10-CM | POA: Insufficient documentation

## 2016-10-01 DIAGNOSIS — L219 Seborrheic dermatitis, unspecified: Secondary | ICD-10-CM | POA: Diagnosis not present

## 2016-10-01 DIAGNOSIS — I10 Essential (primary) hypertension: Secondary | ICD-10-CM

## 2016-10-01 DIAGNOSIS — E1100 Type 2 diabetes mellitus with hyperosmolarity without nonketotic hyperglycemic-hyperosmolar coma (NKHHC): Secondary | ICD-10-CM

## 2016-10-01 DIAGNOSIS — F339 Major depressive disorder, recurrent, unspecified: Secondary | ICD-10-CM

## 2016-10-01 MED ORDER — VENLAFAXINE HCL ER 37.5 MG PO CP24: ORAL_CAPSULE | ORAL | 2 refills | Status: DC

## 2016-10-01 NOTE — Patient Instructions (Signed)
Thank you for coming in to clinic today.  1.   Labs to [facility]  Taper down Celexa, cut in half 20mg  for 1 week, then start new med Venlafaxine 37.5mg  capsule daily with food for up to 2 weeks, if not helping or not enough then can increase to 2 pills for 75mg  daily, let me know if need new rx or refill  2. BP improved on re-check, keep checking at home  3. Go ahead and try to schedule Dermatology apt - if need new referral then let us know  4. GYN will call you for apt  [facility] [address], [place], [place] [address] Hours: [place]: [phone]  ------------- OTHER OPTION if needed [facility]   Address: [address] [place], [place], [place], [place] [address] Hours: 8AM-5PM Phone: [phone]  -----------------------------  Please schedule a follow-up appointment with Dr. Parks Ranger in 4-6 weeks for follow-up Anxiety/Depression GAD7/PHQ9  If you have any other questions or concerns, please feel free to call the clinic or send a message through [place]. You may also schedule an earlier appointment if necessary.  Erin Putnam, DO [place]

## 2016-10-01 NOTE — Progress Notes (Signed)
Subjective:    Patient ID: Erin Potter, female    DOB: [birth year], 56 y.o.   MRN: [id number]  Erin Potter is a 56 y.o. female presenting on 10/01/2016 for Hypertension (obtw pt had fall this past sunday head hurts )   HPI   CHRONIC HTN, Well controlled Reports checks BP at home 130-140/70-80s. States said her BP is usually elevated here in office. Current Meds - Lasix 39m (alternating every other day 1 to 2 pills) per Cardiology. Also was on Losartan int he past by prior PCP, but she did not do well on this and she self discontinued it. Lifestyle - less activity now, attributes to Right ankle prior fracture Denies CP, dyspnea, HA, edema, dizziness / lightheadedness  CHRONIC DM, Type 2: Reports no concerns, wants serum A1c, not POC CBGs: Not checking CBG regularly Meds: Metformin 5081mtwice daily with food Reports good compliance. Tolerating well w/o side-effects No longer on ARB - previously on Losartan Lifestyle: Recent poor lifestyle, not adhering to diet / exercise Denies hypoglycemia, polyuria, visual changes, numbness or tingling.  Elevated LFTs / OBESITY BMI >38 - Prior history of mild abnormal elevated LFTs AST/ALT for past 2-3 years on chart review, thought to be related to fatty liver, states she is taking Milk Thistle for this. - No history of alcohol intake - Admits significant weight gain following accident >1 year ago fell off roof while putting in metal roof, had significant injuries, was bed ridden for months, then limited ambulation, prior R ankle surgical repair still problems  Postmenopausal Syndrome / Vitamin B12, history of deficiency - Expresses concern today for chronic post-menopausal symptoms with hot flashes, significant worsening depression and anxiety, has some panic attacks overnight with waking up. States all started after late 40s with hormonal changes and then worsening over past 1-2 years. In past had been on HRT. Now requesting  re-start Prempro, not followed by GYN currently - Additionally, reports concerns with possible low Vitamin B12, does not recall last time lab was checked, she has family members with low B12, and requests that this be checked today. - Her sister is an RNTherapist, sportsand gives her medical advice often - Thyroid studies have been normal in past - Admits hair loss and thinning hair - Denies any postmenopausal bleeding  Major Depression, Chronic / Anxiety - Reports chronic history of mood disorder with depression and anxiety, onset with menopausal hormonal changes in late 40s, significant worsening over past 2-3 years now. She has been on variety of medications in past including Paxil, now Celexa, does not think it is working, not having side effects. - Admits frequent crying spells, spontaneous without specific trigger - Difficult situation at home with stressors, husband is dx with manic depressive, schizobipolar, on Cymbalta, she is asking about this med. She does have positive mood about her cats. - Currently staying with friend in [place] Gabapentin is helping neuropathy and Amitriptyline seems to be helping sleep  Facial Rash / Scalp Peeling - She is interested to return to Dermatology for this problem, [place]  [facility] 10/01/2016 06/15/2016 02/09/2016  Decreased Interest 3 0 2  Down, Depressed, Hopeless 3 0 2  PHQ - 2 Score 6 0 4  Altered sleeping 3 - 3  Tired, decreased energy 3 - 3  Change in appetite 2 - 3  Feeling bad or failure about yourself  1 - 0  Trouble concentrating 0 - 0  Moving slowly or fidgety/restless 0 - 0  Suicidal thoughts 0 - 0  PHQ-9 Score 15 - 13  Difficult doing work/chores - - Somewhat difficult   GAD 7 : Generalized Anxiety Score 10/01/2016  Nervous, Anxious, on Edge 3  Control/stop worrying 3  Worry too much - different things 2  Trouble relaxing 0  Restless 0  Easily  annoyed or irritable 3  Afraid - awful might happen 2  Total GAD 7 Score 13  Anxiety Difficulty Not difficult at all     Social History  Substance Use Topics  . Smoking status: Former Smoker    Packs/day: 1.50    Years: 35.00    Types: Cigarettes    Quit date: 02/24/2014  . Smokeless tobacco: Never Used  . Alcohol use No    Review of Systems Per HPI unless specifically indicated above     Objective:    BP 128/76 (BP Location: Left Arm, Cuff Size: Normal)   Pulse 80   Temp 98.7 F (37.1 C) (Oral)   Resp 16   Ht 5' 4.4" (1.636 m)   Wt 225 lb 9.6 oz (102.3 kg)   BMI 38.24 kg/m   Wt Readings from Last 3 Encounters:  10/01/16 225 lb 9.6 oz (102.3 kg)  06/15/16 228 lb (103.4 kg)  02/09/16 219 lb (99.3 kg)    Physical Exam  Constitutional: She is oriented to person, place, and time. She appears well-developed and well-nourished. No distress.  HENT:  Mouth/Throat: Oropharynx is clear and moist.  Eyes: Conjunctivae are normal.  Cardiovascular: Normal rate, regular rhythm, normal heart sounds and intact distal pulses.   Pulmonary/Chest: Effort normal.  Neurological: She is alert and oriented to person, place, and time.  Skin: Skin is warm and dry. Rash (facial erythematous rash consistent with contact dermatitis vs prior healing spots, without focal lesions, some evidence of scratching at skin) noted. She is not diaphoretic.  Scalp with some areas of thickened flaking skin, consistent with seborrheic dermatitis  Psychiatric: Her behavior is normal.  Well groomed, good eye contact, normal speech, thoughts are normal but often gets self worked up with changing to anxious thoughts and had one crying spell during visit  Nursing note and vitals reviewed.   I have personally reviewed the following lab results from 10/01/16.  Results for orders placed or performed in visit on 10/01/16  Hemoglobin A1c  Result Value Ref Range   Hgb A1c MFr Bld 7.5 (H) 4.8 - 5.6 %   Est. average  glucose Bld gHb Est-mCnc 169 mg/dL  Vitamin B12  Result Value Ref Range   Vitamin B-12 >2000 (H) 232 - 1245 pg/mL  Comprehensive metabolic panel  Result Value Ref Range   Glucose 86 65 - 99 mg/dL   BUN 10 6 - 24 mg/dL   Creatinine, Ser 0.72 0.57 - 1.00 mg/dL   GFR calc non Af Amer 95 >59 mL/min/1.73   GFR calc Af Amer 109 >59 mL/min/1.73   BUN/Creatinine Ratio 14 9 - 23   Sodium 142 134 - 144 mmol/L   Potassium 4.3 3.5 - 5.2 mmol/L   Chloride 101 96 - 106 mmol/L   CO2 26 18 - 29 mmol/L   Calcium 9.3 8.7 - 10.2 mg/dL   Total Protein 7.4 6.0 - 8.5 g/dL   Albumin 4.3 3.5 - 5.5 g/dL   Globulin, Total 3.1 1.5 - 4.5 g/dL   Albumin/Globulin Ratio 1.4 1.2 - 2.2   Bilirubin Total  0.4 0.0 - 1.2 mg/dL   Alkaline Phosphatase 121 (H) 39 - 117 IU/L   AST 64 (H) 0 - 40 IU/L   ALT 51 (H) 0 - 32 IU/L      Assessment & Plan:   Problem List Items Addressed This Visit    Seborrheic dermatitis of scalp    Clinically consistent on exam, also with reddish generalized appearance to face some evidence of scratching - Return to Dermatology, follow-up as needed      Postmenopausal syndrome    Suspected significant etiology for her mood disorder and vasomotor symptoms with hot flashes contributing to anxiety, gradual onset late 87s now worsening over past 2-3 years - Previously on HRT, and requesting Prempro today - Discussion that I do not routinely rx HRT in postmenopausal females, given her complex other medical conditions, especially with poorly controlled anxiety/depression, worsening DM control, do not plan to pursue this - I will place referral to GYN and she can discuss this or other options further with them      Relevant Medications   venlafaxine XR (EFFEXOR XR) 37.5 MG 24 hr capsule   Other Relevant Orders   Ambulatory referral to Obstetrics / Gynecology   Major depression, recurrent ([place])    Consistent with worsening MDD, labile emotions with crying spells, physically with decreased  energy among other symptoms, likely multifactorial with anxiety, sedentary lifestyle now -GAD7: 13, not difficult / PHQ9: 15 - Failed: Paxil, Celexa - No prior dx / Psych / counseling  Plan: 1. Discussion on new diagnosis anxiety, management, complications 2. Switch Celexa to Venlafaxine - goal for better effect for depressive and anxiety symptoms, additional benefit of reduced postmenopausal vasomotor hot flash symptoms 3. Taper Celexa half of 77m tab for dose 266mdaily for 1 week, then switich to Venlafaxine 37.68m59maily for 2 weeks, if tolerated well 2-4 weeks can increase to 2 capsules for dose 768m76mily - counseled on same side effects as prior SSRI 4. Advised recommend therapy / counseling in future 5. Follow-up 4-6 weeks anxiety, med adjust, GAD7/PHQ9      Relevant Medications   venlafaxine XR (EFFEXOR XR) 37.5 MG 24 hr capsule   GAD (generalized anxiety disorder)    Consistent with persistent GAD now with gradual worsening, physically with decreased energy among other symptoms, likely multifactorial with depression, inactivity -GAD7: 13, not difficult / PHQ9: 15 - Failed: Paxil, Celexa - No prior dx / Psych / counseling  Plan: 1. Discussion on new diagnosis anxiety, management, complications 2. Switch Celexa to Venlafaxine - goal for better effect for depressive and anxiety symptoms, additional benefit of reduced postmenopausal vasomotor hot flash symptoms 3. Taper Celexa half of 40mg30m for dose 20mg 368my for 1 week, then switich to Venlafaxine 37.68mg da53m for 2 weeks, if tolerated well 2-4 weeks can increase to 2 capsules for dose 768mg da24m- counseled on same side effects as prior SSRI 4. Advised recommend therapy / counseling in future 5. Follow-up 4-6 weeks anxiety, med adjust, GAD7/PHQ9      Relevant Medications   venlafaxine XR (EFFEXOR XR) 37.5 MG 24 hr capsule   Family history of vitamin B12 deficiency    Elevated B12, unlikely cause of decreased energy and  other symptoms related to this. - However she could have some skin changes if toxic levels of B12      Relevant Orders   Vitamin B12 (Completed)   Facial rash    May be related to elevated B12 >2000 Follow-up with  Dermatology      Essential hypertension    Initial elevated BP, improved on re-check Remains off Losartan Only on Lasix now alternating dosing Follow outside BPs      Elevated LFTs    Persistent elevated LFTs mildly AST/ALT over past 2-3 years, attributed to fatty liver disease with obesity, given no history of alcohol or other clear etiology - Check CMET today, reviewed results still elevated, also alk phos mild elevated. Consider next step with Abdominal US RUQ, will discuss in future, otherwise encourage improved lifestyle diet / exercise      Relevant Orders   Comprehensive metabolic panel (Completed)   Diabetes mellitus, type 2 (HCC) - Primary    Elevated A1c 6 to 7.5, attributed to poor lifestyle habits, limited exercise now with R ankle - Will notify patient of result and recommend may increase Metformin gradually up to 1031m BID as tolerated, consider other options in future - Counseling on lifestyle, improve DM diet - Follow-up 3 months A1c      Relevant Orders   Hemoglobin A1c (Completed)   Comprehensive metabolic panel (Completed)   Decreased energy    Likely multifactorial with depression/anxiety, weight, deconditioning due to prior injury, also history of IDA - Check Vitamin B12 today by request, recently had TSH normal - Update* result B12 is >2000, not deficient      Relevant Orders   Vitamin B12 (Completed)      Meds ordered this encounter  Medications  .       .           . venlafaxine XR (EFFEXOR XR) 37.5 MG 24 hr capsule    Sig: Once off Celexa, start taking 1 capsule daily with food for up to 2 weeks, if needed can increase to 2 capsules at one time for dose 769m   Dispense:  30 capsule    Refill:  2      Follow up  plan: Return in about 4 weeks (around 10/29/2016) for Anxiety, Depression GAD/PHQ.  AlNobie PutnamDOMc[facility]hattaned[facility] 10/02/2016, 11:09 AM

## 2016-10-02 LAB — COMPREHENSIVE METABOLIC PANEL
ALT: 51 IU/L — ABNORMAL HIGH (ref 0–32)
AST: 64 IU/L — ABNORMAL HIGH (ref 0–40)
Albumin/Globulin Ratio: 1.4 (ref 1.2–2.2)
Albumin: 4.3 g/dL (ref 3.5–5.5)
Alkaline Phosphatase: 121 IU/L — ABNORMAL HIGH (ref 39–117)
BUN / CREAT RATIO: 14 (ref 9–23)
BUN: 10 mg/dL (ref 6–24)
Bilirubin Total: 0.4 mg/dL (ref 0.0–1.2)
CALCIUM: 9.3 mg/dL (ref 8.7–10.2)
CO2: 26 mmol/L (ref 18–29)
CREATININE: 0.72 mg/dL (ref 0.57–1.00)
Chloride: 101 mmol/L (ref 96–106)
GFR calc non Af Amer: 95 mL/min/{1.73_m2} (ref >59)
GFR, EST AFRICAN AMERICAN: 109 mL/min/{1.73_m2} (ref >59)
GLUCOSE: 86 mg/dL (ref 65–99)
Globulin, Total: 3.1 g/dL (ref 1.5–4.5)
Potassium: 4.3 mmol/L (ref 3.5–5.2)
Sodium: 142 mmol/L (ref 134–144)
TOTAL PROTEIN: 7.4 g/dL (ref 6.0–8.5)

## 2016-10-02 LAB — HEMOGLOBIN A1C
Est. average glucose Bld gHb Est-mCnc: 169 mg/dL
Hgb A1c MFr Bld: 7.5 % — ABNORMAL HIGH (ref 4.8–5.6)

## 2016-10-02 LAB — VITAMIN B12: Vitamin B-12: >2000 pg/mL — ABNORMAL HIGH (ref 232–1245)

## 2016-10-02 NOTE — Assessment & Plan Note (Signed)
Consistent with persistent GAD now with gradual worsening, physically with decreased energy among other symptoms, likely multifactorial with depression, inactivity -GAD7: 13, not difficult / PHQ9: 15 - Failed: Paxil, Celexa - No prior dx / Psych / counseling  Plan: 1. Discussion on new diagnosis anxiety, management, complications 2. Switch Celexa to Venlafaxine - goal for better effect for depressive and anxiety symptoms, additional benefit of reduced postmenopausal vasomotor hot flash symptoms 3. Taper Celexa half of 40mg  tab for dose 20mg  daily for 1 week, then switich to Venlafaxine 37.5mg  daily for 2 weeks, if tolerated well 2-4 weeks can increase to 2 capsules for dose 75mg  daily - counseled on same side effects as prior SSRI 4. Advised recommend therapy / counseling in future 5. Follow-up 4-6 weeks anxiety, med adjust, GAD7/PHQ9

## 2016-10-02 NOTE — Assessment & Plan Note (Addendum)
Elevated B12, unlikely cause of decreased energy and other symptoms related to this. - However she could have some skin changes if toxic levels of B12

## 2016-10-02 NOTE — Assessment & Plan Note (Signed)
Suspected significant etiology for her mood disorder and vasomotor symptoms with hot flashes contributing to anxiety, gradual onset late 32s now worsening over past 2-3 years - Previously on HRT, and requesting Prempro today - Discussion that I do not routinely rx HRT in postmenopausal females, given her complex other medical conditions, especially with poorly controlled anxiety/depression, worsening DM control, do not plan to pursue this - I will place referral to GYN and she can discuss this or other options further with them

## 2016-10-02 NOTE — Assessment & Plan Note (Signed)
Elevated A1c 6 to 7.5, attributed to poor lifestyle habits, limited exercise now with R ankle - Will notify patient of result and recommend may increase Metformin gradually up to 1000mg  BID as tolerated, consider other options in future - Counseling on lifestyle, improve DM diet - Follow-up 3 months A1c

## 2016-10-02 NOTE — Assessment & Plan Note (Signed)
Consistent with worsening MDD, labile emotions with crying spells, physically with decreased energy among other symptoms, likely multifactorial with anxiety, sedentary lifestyle now -GAD7: 13, not difficult / PHQ9: 15 - Failed: Paxil, Celexa - No prior dx / Psych / counseling  Plan: 1. Discussion on new diagnosis anxiety, management, complications 2. Switch Celexa to Venlafaxine - goal for better effect for depressive and anxiety symptoms, additional benefit of reduced postmenopausal vasomotor hot flash symptoms 3. Taper Celexa half of 40mg  tab for dose 20mg  daily for 1 week, then switich to Venlafaxine 37.5mg  daily for 2 weeks, if tolerated well 2-4 weeks can increase to 2 capsules for dose 75mg  daily - counseled on same side effects as prior SSRI 4. Advised recommend therapy / counseling in future 5. Follow-up 4-6 weeks anxiety, med adjust, GAD7/PHQ9

## 2016-10-02 NOTE — Assessment & Plan Note (Signed)
Likely multifactorial with depression/anxiety, weight, deconditioning due to prior injury, also history of IDA - Check Vitamin B12 today by request, recently had TSH normal - Update* result B12 is >2000, not deficient

## 2016-10-02 NOTE — Assessment & Plan Note (Signed)
May be related to elevated B12 >2000 Follow-up with Dermatology

## 2016-10-02 NOTE — Assessment & Plan Note (Signed)
Initial elevated BP, improved on re-check Remains off Losartan Only on Lasix now alternating dosing Follow outside BPs

## 2016-10-02 NOTE — Assessment & Plan Note (Signed)
Persistent elevated LFTs mildly AST/ALT over past 2-3 years, attributed to fatty liver disease with obesity, given no history of alcohol or other clear etiology - Check CMET today, reviewed results still elevated, also alk phos mild elevated. Consider next step with Abdominal US RUQ, will discuss in future, otherwise encourage improved lifestyle diet / exercise

## 2016-10-02 NOTE — Assessment & Plan Note (Signed)
Clinically consistent on exam, also with reddish generalized appearance to face some evidence of scratching - Return to Dermatology, follow-up as needed

## 2016-10-05 ENCOUNTER — Other Ambulatory Visit: Payer: Self-pay | Admitting: Family Medicine

## 2016-10-05 DIAGNOSIS — R238 Other skin changes: Secondary | ICD-10-CM

## 2016-10-05 DIAGNOSIS — L709 Acne, unspecified: Secondary | ICD-10-CM

## 2016-10-05 NOTE — Progress Notes (Signed)
amb  

## 2016-10-07 ENCOUNTER — Telehealth: Payer: Self-pay | Admitting: *Deleted

## 2016-10-07 ENCOUNTER — Other Ambulatory Visit: Payer: Self-pay | Admitting: Family Medicine

## 2016-10-07 DIAGNOSIS — R945 Abnormal results of liver function studies: Principal | ICD-10-CM

## 2016-10-07 DIAGNOSIS — E669 Obesity, unspecified: Secondary | ICD-10-CM

## 2016-10-07 DIAGNOSIS — R748 Abnormal levels of other serum enzymes: Secondary | ICD-10-CM

## 2016-10-07 DIAGNOSIS — K760 Fatty (change of) liver, not elsewhere classified: Secondary | ICD-10-CM

## 2016-10-07 DIAGNOSIS — R7989 Other specified abnormal findings of blood chemistry: Secondary | ICD-10-CM

## 2016-10-07 NOTE — Telephone Encounter (Signed)
See result note regarding recent elevated LFTs, with chronic history of LFT abnormality.  Signed order for Abdominal US. Once scheduled patient to be notified, will follow-up as planned afterwards to discuss results.  Erin Potter, [facility] 10/07/2016, 2:10 PM

## 2016-10-07 NOTE — Telephone Encounter (Signed)
Patient would like to proceed with liver ultrasound now.

## 2016-10-11 ENCOUNTER — Telehealth: Payer: Self-pay | Admitting: *Deleted

## 2016-10-11 DIAGNOSIS — M150 Primary generalized (osteo)arthritis: Secondary | ICD-10-CM

## 2016-10-11 DIAGNOSIS — M79605 Pain in left leg: Principal | ICD-10-CM

## 2016-10-11 DIAGNOSIS — G8929 Other chronic pain: Secondary | ICD-10-CM

## 2016-10-11 DIAGNOSIS — M159 Polyosteoarthritis, unspecified: Secondary | ICD-10-CM

## 2016-10-11 NOTE — Telephone Encounter (Signed)
Patient would like a referral to see Dr. Gardiner Fanti at [facility] for left leg pain. f- [phone]

## 2016-10-12 ENCOUNTER — Other Ambulatory Visit: Payer: Self-pay | Admitting: Family Medicine

## 2016-10-12 ENCOUNTER — Telehealth: Payer: Self-pay | Admitting: *Deleted

## 2016-10-12 DIAGNOSIS — M159 Polyosteoarthritis, unspecified: Secondary | ICD-10-CM | POA: Insufficient documentation

## 2016-10-12 DIAGNOSIS — M79605 Pain in left leg: Principal | ICD-10-CM

## 2016-10-12 DIAGNOSIS — G8929 Other chronic pain: Secondary | ICD-10-CM | POA: Insufficient documentation

## 2016-10-12 DIAGNOSIS — R748 Abnormal levels of other serum enzymes: Secondary | ICD-10-CM

## 2016-10-12 NOTE — Addendum Note (Signed)
Addended by: [name] on: 10/12/2016 11:57 AM   Modules accepted: Orders

## 2016-10-12 NOTE — Telephone Encounter (Signed)
U/S scheduled 4-/9/18 [facility] 9:15 NPO after midnight. Referral has been submitted to [facility].

## 2016-10-12 NOTE — Progress Notes (Signed)
Order had to be changed Medicaid has denied u/s complete.

## 2016-10-12 NOTE — Telephone Encounter (Signed)
Patient would like to know if you could change Metformin to something else. She is worried about her kidneys.

## 2016-10-12 NOTE — Telephone Encounter (Signed)
Updated US imaging to US Abdomen Limited RUQ, [id number] by request of radiology.  Already scheduled for 10/18/16.  Nobie Putnam, [facility] 10/12/2016, 11:57 AM

## 2016-10-12 NOTE — Telephone Encounter (Signed)
No changes at this time. Last visit 10/01/16 we discussed her diabetes, with now worsening control A1c increased >7 and I had actually advised her to gradually increase Metformin from 500mg  BID up to 1000mg  BID if tolerated. Her kidney function has been normal. She does not need to be worried about kidneys from metformin, more importantly to control her blood sugar to prevent kidney complication from diabetes.  In future based on progress of A1c we can discuss alternative options, but there are limited appropriate changes for her diabetes at this time.  Nobie Putnam, [facility] 10/12/2016, 6:22 PM

## 2016-10-12 NOTE — Telephone Encounter (Signed)
Last saw patient recently, did not focus on this complaint, but she does have chronic osteoarthritis and back pain, with lower extremity edema.  Referral to follow-up with already established [facility] Dr Gardiner Fanti for chronic Left lower extremity pain, known osteoarthritis multiple sites, in setting of chronic low back pain.   Nobie Putnam, [facility] 10/12/2016, 12:13 PM

## 2016-10-13 NOTE — Telephone Encounter (Signed)
Patient advised.

## 2016-10-18 ENCOUNTER — Ambulatory Visit: Payer: Medicaid Other

## 2016-10-18 ENCOUNTER — Ambulatory Visit
Admission: RE | Admit: 2016-10-18 | Discharge: 2016-10-18 | Disposition: A | Discharge status: Home / Self Care | Payer: Medicaid Other | Source: Ambulatory Visit | Attending: Family Medicine | Admitting: Family Medicine

## 2016-10-18 DIAGNOSIS — E669 Obesity, unspecified: Secondary | ICD-10-CM | POA: Diagnosis not present

## 2016-10-18 DIAGNOSIS — R7989 Other specified abnormal findings of blood chemistry: Secondary | ICD-10-CM | POA: Insufficient documentation

## 2016-10-18 DIAGNOSIS — K8020 Calculus of gallbladder without cholecystitis without obstruction: Secondary | ICD-10-CM | POA: Diagnosis not present

## 2016-10-18 DIAGNOSIS — R945 Abnormal results of liver function studies: Secondary | ICD-10-CM

## 2016-10-19 ENCOUNTER — Ambulatory Visit (INDEPENDENT_AMBULATORY_CARE_PROVIDER_SITE_OTHER): Payer: Medicaid Other | Admitting: Obstetrics and Gynecology

## 2016-10-19 ENCOUNTER — Encounter: Payer: Self-pay | Admitting: Obstetrics and Gynecology

## 2016-10-19 VITALS — BP 138/74 | HR 105 | Ht 62.0 in | Wt 215.4 lb

## 2016-10-19 DIAGNOSIS — Z0000 Encounter for general adult medical examination without abnormal findings: Secondary | ICD-10-CM

## 2016-10-19 NOTE — Addendum Note (Signed)
Addended by: Raliegh Ip on: 10/19/2016 12:13 PM   Modules accepted: Orders

## 2016-10-19 NOTE — Progress Notes (Signed)
HPI:      [name] is a 56 y.o. [phone] who LMP was No LMP recorded. Patient is postmenopausal.  Subjective:   She presents today for her annual examination.  She is on multiple medications to her physician's office in [place]. She recently had a mammogram with follow-up compressions done and is recommended for another one 6 months later. She says that they will contact her. She states that she is considering hormones and would like to discuss this because she feels like she is having some hair loss and that estrogen will fix this. She also feels like she is having more anxiety than she has before and is questioning other estrogen will fix this as well.    Hx: The following portions of the patient's history were reviewed and updated as appropriate:              She  has a past medical history of Anxiety; Asthma; GERD (gastroesophageal reflux disease); Hypertension; Hypothyroidism; Neuromuscular disorder ([place]); Shortness of breath dyspnea; and Sleep apnea. She  does not have any pertinent problems on file. She  has a past surgical history that includes Tonsillectomy; Tubal ligation; Neck surgery; TUBALPLASTY; Leg Surgery (Right); Colonoscopy with propofol (N/A, 02/21/2015); and polypectomy (02/21/2015). Her family history is not on file. She  reports that she quit smoking about 2 years ago. Her smoking use included Cigarettes. She has a 52.50 pack-year smoking history. She has never used smokeless tobacco. She reports that she does not drink alcohol or use drugs. Current Outpatient Prescriptions on File Prior to Visit  Medication Sig Dispense Refill  . ACCU-CHEK FASTCLIX LANCETS MISC TEST once daily 100 each 12  . ACCU-CHEK SMARTVIEW test strip TEST 4 TIMES DAILY BEFORE MEALS AND BEFORE BEDTIME. NEEDS FOLLOW UP APPOINTMENT 100 each 12  . albuterol (PROAIR HFA) 108 (90 Base) MCG/ACT inhaler Inhale 2 puffs into the lungs every 6 (six) hours as needed for wheezing or shortness of  breath. 1 Inhaler 11  . amitriptyline (ELAVIL) 25 MG tablet Take 0.5 tablets (12.5 mg total) by mouth at bedtime. 15 tablet 6  . baclofen (LIORESAL) 10 MG tablet Take 1 tabs twice daily and 2 additional tabs at bedtime. 180 each 6  . benzonatate (TESSALON) 100 MG capsule Take 1 capsule (100 mg total) by mouth 2 (two) times daily as needed for cough. 30 capsule 0  . budesonide-formoterol (SYMBICORT) 160-4.5 MCG/ACT inhaler Inhale 2 puffs into the lungs 2 (two) times daily. 1 Inhaler 12  . chlorhexidine (PERIDEX) 0.12 % solution 1 mL by Mouth Rinse route daily.  0  . diclofenac sodium (VOLTAREN) 1 % GEL Apply 1 application topically as needed.    Marland Kitchen esomeprazole (NEXIUM) 40 MG capsule Take 1 capsule (40 mg total) by mouth 2 (two) times daily. Patient needs appointment in office to continue to get this medication 60 capsule 12  . ferrous sulfate 325 (65 FE) MG tablet Take 1 tablet by mouth daily.    . fluoruracil (CARAC) 0.5 % cream Apply 0.5 mg topically daily as needed.  0  . furosemide (LASIX) 20 MG tablet Alternate taking 1, 20 mg Lasix with 2, 20 mg tabs, every other day 45 tablet 6  . gabapentin (NEURONTIN) 400 MG capsule Take 1 capsule (400 mg total) by mouth 3 (three) times daily. Patient needs appointment at office to continue to get this medication. (Patient taking differently: Take 400 mg by mouth 3 (three) times daily. ) 90 capsule 12  . ipratropium-albuterol (  DUONEB) 0.5-2.5 (3) MG/3ML SOLN Take 3 mLs by nebulization every 2 (two) hours as needed. 360 mL 12  . levothyroxine (SYNTHROID, LEVOTHROID) 175 MCG tablet Take 1 tablet (175 mcg total) by mouth daily. 30 tablet 12  . meloxicam (MOBIC) 15 MG tablet take 1 tablet by mouth once daily 30 tablet 6  . metFORMIN (GLUCOPHAGE) 500 MG tablet Take 1 tablet (500 mg total) by mouth 2 (two) times daily with a meal. 180 tablet 3  . Milk Thistle 500 MG CAPS Take 500 mg by mouth daily.    . Multiple Vitamin (MULTIVITAMIN) tablet Take 1 tablet by mouth  daily.    . Probiotic Product (PRO-BIOTIC BLEND PO) Take 1 capsule by mouth daily.    Marland Kitchen tiotropium (SPIRIVA HANDIHALER) 18 MCG inhalation capsule Place 1 capsule (18 mcg total) into inhaler and inhale every morning. AS NEEDED 30 capsule 12  . venlafaxine XR (EFFEXOR XR) 37.5 MG 24 hr capsule Once off Celexa, start taking 1 capsule daily with food for up to 2 weeks, if needed can increase to 2 capsules at one time for dose 75mg  30 capsule 2  . cyanocobalamin 100 MCG tablet Take 100 mcg by mouth daily.     No current facility-administered medications on file prior to visit.          Review of Systems:  Review of Systems  Constitutional: Denied constitutional symptoms, night sweats, recent illness, fatigue, fever, insomnia and weight loss.  Eyes: Denied eye symptoms, eye pain, photophobia, vision change and visual disturbance.  Ears/Nose/Throat/Neck: Denied ear, nose, throat or neck symptoms, hearing loss, nasal discharge, sinus congestion and sore throat.  Cardiovascular: Denied cardiovascular symptoms, arrhythmia, chest pain/pressure, edema, exercise intolerance, orthopnea and palpitations.  Respiratory: Denied pulmonary symptoms, asthma, pleuritic pain, productive sputum, cough, dyspnea and wheezing.  Gastrointestinal: Denied, gastro-esophageal reflux, melena, nausea and vomiting.  Genitourinary: Patient states she has been diagnosed with cystocele and rectocele.  Musculoskeletal: Denied musculoskeletal symptoms, stiffness, swelling, muscle weakness and myalgia.  Dermatologic: Denied dermatology symptoms, rash and scar.  Neurologic: Denied neurology symptoms, dizziness, headache, neck pain and syncope.  Psychiatric: Denied psychiatric symptoms, anxiety and depression.  Endocrine: Denied endocrine symptoms including hot flashes and night sweats.   Meds:   Current Outpatient Prescriptions on File Prior to Visit  Medication Sig Dispense Refill  . ACCU-CHEK FASTCLIX LANCETS MISC TEST once  daily 100 each 12  . ACCU-CHEK SMARTVIEW test strip TEST 4 TIMES DAILY BEFORE MEALS AND BEFORE BEDTIME. NEEDS FOLLOW UP APPOINTMENT 100 each 12  . albuterol (PROAIR HFA) 108 (90 Base) MCG/ACT inhaler Inhale 2 puffs into the lungs every 6 (six) hours as needed for wheezing or shortness of breath. 1 Inhaler 11  . amitriptyline (ELAVIL) 25 MG tablet Take 0.5 tablets (12.5 mg total) by mouth at bedtime. 15 tablet 6  . baclofen (LIORESAL) 10 MG tablet Take 1 tabs twice daily and 2 additional tabs at bedtime. 180 each 6  . benzonatate (TESSALON) 100 MG capsule Take 1 capsule (100 mg total) by mouth 2 (two) times daily as needed for cough. 30 capsule 0  . budesonide-formoterol (SYMBICORT) 160-4.5 MCG/ACT inhaler Inhale 2 puffs into the lungs 2 (two) times daily. 1 Inhaler 12  . chlorhexidine (PERIDEX) 0.12 % solution 1 mL by Mouth Rinse route daily.  0  . diclofenac sodium (VOLTAREN) 1 % GEL Apply 1 application topically as needed.    Marland Kitchen esomeprazole (NEXIUM) 40 MG capsule Take 1 capsule (40 mg total) by mouth 2 (two) times  daily. Patient needs appointment in office to continue to get this medication 60 capsule 12  . ferrous sulfate 325 (65 FE) MG tablet Take 1 tablet by mouth daily.    . fluoruracil (CARAC) 0.5 % cream Apply 0.5 mg topically daily as needed.  0  . furosemide (LASIX) 20 MG tablet Alternate taking 1, 20 mg Lasix with 2, 20 mg tabs, every other day 45 tablet 6  . gabapentin (NEURONTIN) 400 MG capsule Take 1 capsule (400 mg total) by mouth 3 (three) times daily. Patient needs appointment at office to continue to get this medication. (Patient taking differently: Take 400 mg by mouth 3 (three) times daily. ) 90 capsule 12  . ipratropium-albuterol (DUONEB) 0.5-2.5 (3) MG/3ML SOLN Take 3 mLs by nebulization every 2 (two) hours as needed. 360 mL 12  . levothyroxine (SYNTHROID, LEVOTHROID) 175 MCG tablet Take 1 tablet (175 mcg total) by mouth daily. 30 tablet 12  . meloxicam (MOBIC) 15 MG tablet take  1 tablet by mouth once daily 30 tablet 6  . metFORMIN (GLUCOPHAGE) 500 MG tablet Take 1 tablet (500 mg total) by mouth 2 (two) times daily with a meal. 180 tablet 3  . Milk Thistle 500 MG CAPS Take 500 mg by mouth daily.    . Multiple Vitamin (MULTIVITAMIN) tablet Take 1 tablet by mouth daily.    . Probiotic Product (PRO-BIOTIC BLEND PO) Take 1 capsule by mouth daily.    Marland Kitchen tiotropium (SPIRIVA HANDIHALER) 18 MCG inhalation capsule Place 1 capsule (18 mcg total) into inhaler and inhale every morning. AS NEEDED 30 capsule 12  . venlafaxine XR (EFFEXOR XR) 37.5 MG 24 hr capsule Once off Celexa, start taking 1 capsule daily with food for up to 2 weeks, if needed can increase to 2 capsules at one time for dose 75mg  30 capsule 2  . cyanocobalamin 100 MCG tablet Take 100 mcg by mouth daily.     No current facility-administered medications on file prior to visit.     Objective:     Vitals:   10/19/16 1117  BP: 138/74  Pulse: (!) 105              Physical examination General NAD, Conversant  HEENT Atraumatic; Op clear with mmm.  Normo-cephalic. Pupils reactive. Anicteric sclerae  Thyroid/Neck Smooth without nodularity or enlargement. Normal ROM.  Neck Supple.  Skin No rashes, lesions or ulceration. Normal palpated skin turgor. No nodularity.  Breasts: No masses or discharge.  Symmetric.  No axillary adenopathy.  Lungs: Clear to auscultation.No rales or wheezes. Normal Respiratory effort, no retractions.  Heart: NSR.  No murmurs or rubs appreciated. No periferal edema  Abdomen: Soft.  Non-tender.  No masses.  No HSM. No hernia  Extremities: Moves all appropriately.  Normal ROM for age. No lymphadenopathy.  Neuro: Oriented to PPT.  Normal mood. Normal affect.     Pelvic:   Vulva: Normal appearance.  No lesions.  Vagina: No lesions or abnormalities noted.  Support: Second-degree cystocele second degree rectocele   Urethra No masses tenderness or scarring.  Meatus Normal size without lesions  or prolapse.  Cervix: Normal appearance.  No lesions.  Anus: Normal exam.  No lesions.  Perineum: Normal exam.  No lesions.        Bimanual   Uterus: Normal size.  Non-tender.  Mobile.  AV.  Adnexae: No masses.  Non-tender to palpation.  Cul-de-sac: Negative for abnormality.  Exam limited by patient body habitus    Assessment:  [id number] Patient Active Problem List   Diagnosis Date Noted  . Osteoarthritis of multiple joints 10/12/2016  . Chronic pain of left lower extremity 10/12/2016  . Major depression, recurrent ([place]) 10/01/2016  . GAD (generalized anxiety disorder) 10/01/2016  . Postmenopausal syndrome 10/01/2016  . Family history of vitamin B12 deficiency 10/01/2016  . Decreased energy 10/01/2016  . Facial rash 10/01/2016  . Seborrheic dermatitis of scalp 10/01/2016  . Elevated LFTs 10/01/2016  . Atypical chest pain 06/29/2016  . 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 Pruritic disorder 06/16/2016  . Diabetes mellitus, type 2 ([place]) 02/09/2016  . Allergic contact dermatitis 02/09/2016  . Paroxysmal dyspnea 02/09/2016  . RLS (restless legs syndrome) 02/09/2016  . Allergic rhinitis, seasonal 02/09/2016  . Eczema 05/28/2015  . Neuropathy ([place]) 05/28/2015  . Essential hypertension 05/28/2015  . COPD (chronic obstructive pulmonary disease) ([place]) 05/28/2015  . Hypothyroidism 05/28/2015  . GERD (gastroesophageal reflux disease) 05/28/2015  . Chronic fatigue 05/28/2015  . Hx of colonic polyps   . Benign neoplasm of sigmoid colon   . Incomplete emptying of bladder 11/11/2014  . Mixed incontinence urge and stress 11/11/2014  . Rectocele 11/11/2014  . Secondary localized osteoarthrosis of ankle and foot 08/19/2014  . Ulnar neuropathy of left upper extremity 08/16/2014  . Impingement syndrome of both shoulders 08/05/2014  . Fixation hardware in lower extremity 04/23/2014  . Bilateral edema of lower extremity 04/22/2014  . Mild persistent asthma 04/22/2014  . Obesity (BMI 35.0-39.9  without comorbidity) 04/22/2014  . Former smoker 08/13/2013  . Cubital tunnel syndrome 08/20/2011  . Incontinence of urine 06/11/2011  . Prolapsed, uterovaginal, incomplete 06/08/2011     1. Encounter for annual physical exam   2. Morbid obesity ([place])        Plan:            1.  Basic Screening Recommendations The basic screening recommendations for asymptomatic women were discussed with the patient during her visit.  The age-appropriate recommendations were discussed with her and the rational for the tests reviewed.  When I am informed by the patient that another primary care physician has previously obtained the age-appropriate tests and they are up-to-date, only outstanding tests are ordered and referrals given as necessary.  Abnormal results of tests will be discussed with her when all of her results are completed. She has asked that only her Pap smear be performed and that she will get her mammogram and other lab work through her other physicians office. Pap performed  2.  We have discussed hormone replacement therapy in detail and I don't believe the problem of hair loss and anxiety will be fixed by the use of HRT. Additionally, as she describes being in menopause since the age of 22 I do not believe that the benefits outweigh the risks considering her physical condition, age, and other medications. I have explained this to her in detail.       F/U  Return in about 1 year (around 10/19/2017) for Annual Physical.  Finis Bud, M.D. 10/19/2016 12:04 PM

## 2016-10-21 LAB — PAP IG AND HPV HIGH-RISK
HPV, high-risk: NEGATIVE
PAP Smear Comment: 0

## 2016-10-22 ENCOUNTER — Other Ambulatory Visit: Payer: Self-pay | Admitting: Family Medicine

## 2016-10-22 ENCOUNTER — Telehealth: Payer: Self-pay

## 2016-10-22 DIAGNOSIS — E119 Type 2 diabetes mellitus without complications: Secondary | ICD-10-CM

## 2016-10-22 MED ORDER — ACCU-CHEK NANO SMARTVIEW W/DEVICE KIT: 1.0000 | PACK | 0 refills | Status: DC

## 2016-10-22 NOTE — Telephone Encounter (Signed)
Message sent through mychart- normal test results per provider

## 2016-10-22 NOTE — Telephone Encounter (Signed)
-----   Message from Harlin Heys, MD sent at 10/22/2016 10:23 AM EDT ----- Negative Pap and HPV

## 2016-10-29 ENCOUNTER — Telehealth: Payer: Self-pay

## 2016-10-29 DIAGNOSIS — R928 Other abnormal and inconclusive findings on diagnostic imaging of breast: Secondary | ICD-10-CM

## 2016-10-29 NOTE — Telephone Encounter (Deleted)
Previous entry was made in error.

## 2016-10-29 NOTE — Telephone Encounter (Deleted)
This request has been declined. She was last prescribed only 3 tablets daily. If orthopedics approved a dose increase, they are the ones who need to prescribe that amount and send a Rx to the pharmacy.

## 2016-11-01 NOTE — Telephone Encounter (Signed)
Patient advised as below.  

## 2016-11-01 NOTE — Addendum Note (Signed)
Addended by: [name] on: 11/01/2016 01:05 PM   Modules accepted: Orders

## 2016-11-01 NOTE — Telephone Encounter (Addendum)
Last Mammogram 06/2016 had abnormality, then had diagnostic mammogram and ultrasound bilaterally, which showed - IMPRESSION: Probably benign bilateral breast findings. Per Radiology - RECOMMENDATION: Bilateral diagnostic mammogram and possible ultrasound in 6 months (approx 12/2016).  I have placed orders for patient to get next repeat diagnostic mammogram bilateral and bilateral breast ultrasound now due in about 2 months.  Please notify patient of this timeline. She may schedule these images for June 2018 or approximately 2 months from now.  Nobie Putnam, [facility] 11/01/2016, 1:02 PM

## 2016-11-17 ENCOUNTER — Other Ambulatory Visit: Payer: Self-pay

## 2016-11-17 MED ORDER — BACLOFEN 10 MG PO TABS: ORAL_TABLET | ORAL | 6 refills | Status: DC

## 2016-11-17 NOTE — Telephone Encounter (Signed)
Last ov 10/06/16  Last filled 01/07/16 Pharmacy requesting refill

## 2016-11-18 ENCOUNTER — Telehealth: Payer: Self-pay | Admitting: Family Medicine

## 2016-11-18 NOTE — Telephone Encounter (Signed)
Am currently out of office this afternoon for admin time, will not be able to call patients until back tomorrow. Will try to contact her tomorrow 5/11, if unable to then perhaps [facility] staff may be able to clarify which medication.  Nobie Putnam, [facility] 11/18/2016, 6:33 PM

## 2016-11-18 NOTE — Telephone Encounter (Signed)
Pt was advised to schedule an appt but she asked for a call back from you instead.  She said it was about increasing a medication that was discussed at a prior appt.  Her call back number is [phone]

## 2016-11-19 ENCOUNTER — Encounter: Payer: Self-pay | Admitting: Family Medicine

## 2016-11-19 NOTE — Telephone Encounter (Signed)
Attempted to call patient today 11/19/16 at 0830, did not answer, I left voice message for her to call back, and we can try to call again later.  Nobie Putnam, [facility] 11/19/2016, 8:30 AM

## 2016-11-19 NOTE — Telephone Encounter (Signed)
Called patient, approx 5:30pm, spoke with Erin Potter. Reviewed the following information and advised her that I would send it to her in MyChart message so she could reference the addresses/contact information later. Questions answered. ------------------------------ Copy of mychart message sent today 11/19/16.  Go ahead and increase Venlafaxine as discussed on phone today. Take 2 of the 37.5mg  capsules at ONE time in morning, for dose of 75mg  daily, try this for 4 weeks or so to see if effective. Let me know, I can send new rx for dose 75mg  capsules if this works, or we can increase further as needed. Would probably need to see you back to discuss that in office.   Regarding therapist, here are your options locally. All of these options are SELF referral. You should be able to contact them or go in person and provide your insurance information and schedule an appointment, they will tell you more about cost and coverage.   If none of these options work for you, then we can consider a referral to [facility]) they will likely need a referral from [place] for therapist.   (these first two have Psychiatry and Psychology / therapist available)   Self Referral  [facility]) [place]  [address] [place], [place] [address]  Phone: [phone]   [facility], available walk-in 9am-4pm M-F  [address], [place] [address]  Hours: [place] (M-F, walk in available)  [phone]   -----------------------------------  (These last 2 options are independent, ONLY psychologist therapy)   1. [facility] [place]  [place]   Address: [place], [place], [place] [address]  Hours: Open today  9AM-7PM  Phone: [phone]   2. [place] [place]  Address: [address] [place], [place], [place] [address]  Phone: [phone]

## 2016-12-14 ENCOUNTER — Telehealth: Payer: Self-pay

## 2016-12-14 DIAGNOSIS — F411 Generalized anxiety disorder: Secondary | ICD-10-CM

## 2016-12-14 DIAGNOSIS — F331 Major depressive disorder, recurrent, moderate: Secondary | ICD-10-CM

## 2016-12-14 MED ORDER — CITALOPRAM HYDROBROMIDE 40 MG PO TABS: ORAL_TABLET | ORAL | 0 refills | Status: DC

## 2016-12-14 NOTE — Telephone Encounter (Signed)
Last apt with me was on 10/01/16, for variety of issues, in addition to anxiety and mood, she was asked to return in 4 weeks due to med change specifically to focus on her anxiety med, however she did not follow-up, now 2-3 months later. She has communicated instead via MyChart and Telephone requesting med changes, several dose changes have been advised.  Now she is not tolerating Venlafaxine, and switched. I'm assuming she has resumed the Celexa.  I have advised her that I am recommending she establish with therapist and likely Psychiatrist, given her resources on this, and am waiting to hear back.  Please notify her that she needs to schedule office visit if has concerns with her anxiety/mood medication, and would like to discuss any changes.  I am concerned that she is non-adherent to recommendations and has changed doses on her own without following up in the interval.  Nobie Putnam, DO [facility] 12/14/2016, 1:02 PM

## 2016-12-14 NOTE — Addendum Note (Signed)
Addended by: [name] on: 12/14/2016 06:28 PM   Modules accepted: Orders

## 2016-12-14 NOTE — Telephone Encounter (Signed)
The pt insist on you sending a refill in to her pharmacy for Celexa, because she can't come in right now and her head is spinning. I informed her that she is well overdue for a f/u appt and of your recommendations that she establish care w/ therapist/Psychiatrist. She states she got to much going on right now and she doesn't want to talk about that right now. She demands that you call her, because you always send in a script for her.

## 2016-12-14 NOTE — Telephone Encounter (Signed)
Patient called stating that she has stopped her venafaxine XR due to itching and makes her skin crawl.  She reported that she is going to start back on her old medication.  Please advise

## 2016-12-14 NOTE — Telephone Encounter (Signed)
(  For my own documentation) Reviewed chart again, she was previously on Celexa 40mg  daily and tolerated it well but was not sure if it was helping in the past, which is why we switched on in 09/2016 to Venlafaxine (to also help her hot flashes more).  Given her current side effects on the Venlafaxine we can switch her back to Celexa by her request. However, as discussed with her non-adherence to follow-up appointments, I will only send a 1 month supply of the Celexa 40mg  tablets, and she will need to follow-up for any further refills on this medication. --------------------------------------------------------------------------------------------------------------------------- I was not able to call patient today 12/14/16 due to late time after clinic patients. Could you please notify her of the following information?  1. If she is already off the Venlafaxine completely for more than 3+ days then she does not need to taper off of this any further. However, if she is still taking it once daily she should take it every OTHER day for 1 week then STOP.  2. I have sent new rx Celexa 40mg  tabs to [facility] in [place]. This is a 1 month supply only and I will only plan on refilling this medication if she follows up in office as requested within 4-6 weeks  3. She should start with HALF tablet for dose Celexa 20mg  daily for 1 week, and then if tolerated well, she may increase back up to Celexa 40mg  WHOLE tablet daily.  4. Schedule follow-up to focus ONLY on Anxiety/Mood PHQ/GAD in 4 to 6 weeks to see how she is adjusting back on the Celexa and discuss any other med adjustment. We can discuss Psychiatry and therapy referrals again at that point.  Nobie Putnam, [facility] 12/14/2016, 6:26 PM

## 2016-12-15 NOTE — Telephone Encounter (Signed)
Attempted to contact the pt, no answer, LMOM.

## 2016-12-15 NOTE — Telephone Encounter (Signed)
I spoke w/ pt and she informed me that she stopped the Effexor Monday and picked up the script for the Celexa 40 MG on yesterday and took the whole tablet. I informed her that you wanted her to start off on 20 MG for a week and increase to 40 MG. I also informed the pt that you would like to see her 4 to 6 weeks. The pt didn't have time to schedule an appt and she stated she already taking Celexa before. I stress to the pt that you will not refill her script w/o her following up.

## 2016-12-16 ENCOUNTER — Ambulatory Visit: Payer: Medicaid Other

## 2016-12-16 ENCOUNTER — Other Ambulatory Visit: Payer: Medicaid Other

## 2016-12-21 ENCOUNTER — Other Ambulatory Visit: Payer: Self-pay | Admitting: Family Medicine

## 2016-12-21 DIAGNOSIS — N951 Menopausal and female climacteric states: Secondary | ICD-10-CM

## 2016-12-21 DIAGNOSIS — F411 Generalized anxiety disorder: Secondary | ICD-10-CM

## 2016-12-21 DIAGNOSIS — F331 Major depressive disorder, recurrent, moderate: Secondary | ICD-10-CM

## 2016-12-30 ENCOUNTER — Telehealth: Payer: Self-pay | Admitting: Obstetrics and Gynecology

## 2016-12-30 NOTE — Telephone Encounter (Signed)
Patient would like call with pap results

## 2017-01-07 ENCOUNTER — Ambulatory Visit: Payer: Medicaid Other | Admitting: Family Medicine

## 2017-01-13 ENCOUNTER — Other Ambulatory Visit: Payer: Self-pay | Admitting: Family Medicine

## 2017-01-13 DIAGNOSIS — F411 Generalized anxiety disorder: Secondary | ICD-10-CM

## 2017-01-13 DIAGNOSIS — F331 Major depressive disorder, recurrent, moderate: Secondary | ICD-10-CM

## 2017-01-14 ENCOUNTER — Ambulatory Visit (INDEPENDENT_AMBULATORY_CARE_PROVIDER_SITE_OTHER): Payer: Medicaid Other | Admitting: Family Medicine

## 2017-01-14 ENCOUNTER — Ambulatory Visit: Payer: Medicaid Other | Admitting: Family Medicine

## 2017-01-14 ENCOUNTER — Encounter: Payer: Self-pay | Admitting: Family Medicine

## 2017-01-14 VITALS — BP 138/84 | HR 87 | Temp 99.3°F | Resp 16 | Ht 62.0 in | Wt 228.2 lb

## 2017-01-14 DIAGNOSIS — E1165 Type 2 diabetes mellitus with hyperglycemia: Secondary | ICD-10-CM | POA: Diagnosis not present

## 2017-01-14 DIAGNOSIS — E1142 Type 2 diabetes mellitus with diabetic polyneuropathy: Secondary | ICD-10-CM

## 2017-01-14 DIAGNOSIS — E034 Atrophy of thyroid (acquired): Secondary | ICD-10-CM

## 2017-01-14 DIAGNOSIS — K219 Gastro-esophageal reflux disease without esophagitis: Secondary | ICD-10-CM

## 2017-01-14 DIAGNOSIS — IMO0002 Reserved for concepts with insufficient information to code with codable children: Secondary | ICD-10-CM

## 2017-01-14 DIAGNOSIS — E1140 Type 2 diabetes mellitus with diabetic neuropathy, unspecified: Secondary | ICD-10-CM

## 2017-01-14 DIAGNOSIS — G629 Polyneuropathy, unspecified: Secondary | ICD-10-CM

## 2017-01-14 DIAGNOSIS — R600 Localized edema: Secondary | ICD-10-CM

## 2017-01-14 DIAGNOSIS — F331 Major depressive disorder, recurrent, moderate: Secondary | ICD-10-CM

## 2017-01-14 DIAGNOSIS — F411 Generalized anxiety disorder: Secondary | ICD-10-CM | POA: Diagnosis not present

## 2017-01-14 LAB — POCT GLYCOSYLATED HEMOGLOBIN (HGB A1C): Hemoglobin A1C: 6.7

## 2017-01-14 MED ORDER — METFORMIN HCL 500 MG PO TABS: 500.0000 mg | ORAL_TABLET | Freq: Two times a day (BID) | ORAL | 5 refills | Status: DC

## 2017-01-14 MED ORDER — LEVOTHYROXINE SODIUM 175 MCG PO TABS: 175.0000 ug | ORAL_TABLET | Freq: Every day | ORAL | 3 refills | Status: DC

## 2017-01-14 MED ORDER — CITALOPRAM HYDROBROMIDE 40 MG PO TABS: 40.0000 mg | ORAL_TABLET | Freq: Every day | ORAL | 1 refills | Status: DC

## 2017-01-14 MED ORDER — GABAPENTIN 400 MG PO CAPS: 400.0000 mg | ORAL_CAPSULE | Freq: Three times a day (TID) | ORAL | 3 refills | Status: DC

## 2017-01-14 MED ORDER — ESOMEPRAZOLE MAGNESIUM 40 MG PO CPDR: 40.0000 mg | DELAYED_RELEASE_CAPSULE | Freq: Every day | ORAL | 3 refills | Status: DC

## 2017-01-14 NOTE — Patient Instructions (Addendum)
Thank you for coming to the clinic today.  1.  A1c today, will notify you with results.  Refilled Metformin for now  Trulicity (Dulaglutide) - once weekly - this is very good one, usually one of my top choices as well, two doses, 0.75 (likely we would start) and 1.5 max dose. We can use coupon card here too  Victoza (Liraglutide) - once DAILY - 3 dose changes 0.6, 1.2 and 1.8, side effects nausea, upset stomach higher on this one but it is still very effective medicine ---------------  Referral to Vascular surgery, Dr Leotis Pain - if you don't hear back within 2 weeks call them to check status, they can discuss chronic leg swelling in lower extremity, and discuss imaging.   [facility] [place], [place] [address]  Main: [phone]   2. Refilled Citalopram 40mg  daily - given 3 month supply +1 refill - need to see you back within 6 months  3. Refilled Gabapentin  Refilled Nexium - reduce dose down to 40mg  once daily - instead of twice   For neck in future we can consider Physical Therapy - you have order, you can call them for apt and BRING PAPER  PHYSICAL THERAPY (PT)  [facility] [address] [place], [place] [address] Phone: [phone]  [address] [place], [place] [address] Phone: [phone]  Please schedule a Follow-up Appointment to: Return in about 3 months (around 04/16/2017) for diabetes.  If you have any other questions or concerns, please feel free to call the clinic or send a message through [place]. You may also schedule an earlier appointment if necessary.  Additionally, you may be receiving a survey about your experience at our clinic within a few days to 1 week by e-mail or mail. We value your feedback.  Nobie Putnam, DO [facility]

## 2017-01-14 NOTE — Assessment & Plan Note (Signed)
Controlled hypothyroid on levothyroxine Last TSH normal in 06/2016 Refilled Levothyroxine 115mcg daily Check future TSH in 06/2017

## 2017-01-14 NOTE — Assessment & Plan Note (Signed)
Stable chronic bilateral LE edema R>L due to prior ortho injury, concern possible lymphedema with prior surgery. - Complicated by obesity, limited exercise  Plan: 1. Referral to [facility] further evaluation, patient concern about imaging for LE, no evidence of DVT clinically, reassurance given. Suspect may benefit from more aggressive compression therapy consider alternative management instead of Furosemide - counseling on risk to kidneys with long-term use

## 2017-01-14 NOTE — Assessment & Plan Note (Signed)
Well-controlled DM with A1c 6.7 (improved from 7s) Complications - peripheral neuropathy  Plan:  1. Continue current therapy - Metformin 500mg  BID - consider XR vs titrate down dose in future, vs switch to GLP1 - interested also for wt loss, medicaid likely trulicity vs victoza preferred, will notify office or at next visit consider rx new med 2. Encourage improved lifestyle - low carb, low sugar diet, reduce portion size, continue improving regular exercise 3. Consider future ASA, Statin, ACEi 5. DM Foot exam done today. Advised to schedule DM ophtho exam, send record 6. Follow-up 3 months dedicated DM follow-up A1c

## 2017-01-14 NOTE — Assessment & Plan Note (Signed)
Stable chronic problem, bilateral lower ext, suspected secondary to DM and also prior MSK injury fracture also chronic edema - Refilled Gabapentin, baclofen - continue to take regularly since improved. May continue Amitriptyline

## 2017-01-14 NOTE — Assessment & Plan Note (Signed)
Stable to improved See MDD

## 2017-01-14 NOTE — Progress Notes (Signed)
Subjective:    Patient ID: Erin Potter, female    DOB: [birth year], 56 y.o.   MRN: [id number]  Erin Potter is a 56 y.o. female presenting on 01/14/2017 for Depression  HPI   Major Depression, Chronic / Anxiety - Last visit with me 10/01/16, for same problem among other issues, treated with switch from Celexa 40mg  daily to Effexor 37.5mg  daily with instructions to taper up to x 2 pills for 75mg  daily, see prior notes for background information. - Interval update with frequent interactions with office via MyChart messaging and phone calls regarding her mental health medication and dosing, often requested dose changes and refills despite not following up as advised within initial 4-6 weeks, also self tapered and changed doses. She resumed therapy with prior Celexa in interval, now has been on 40mg  daily for several weeks now - Today patient reports overall mood feels better back on Celexa. Could not tolerate Effexor, states she may have been "allergic" worse on 75mg  daily, unable to describe, said it was not helping hot flashes. Had seen GYN in interval as well, was not offered HRT. - She has not established with psychology or therapist since last visit - Additionally she called pharmacist during visit to confirm her dosing, despite EHR dosing said to be at 40mg  daily  Chronic Neuropathy, Lower Extremity Pain / Chronic LE edema / Chronic Neck Pain - Reports chronic problem, prior history RLE injury, ankle fracture, prior surgery, has chronic swelling and pain, neuropathy thought related to DM or other injury. - Worried about taking gabapentin and baclofen today, due to "safety" and concerns with gabapentin heard from friends and in media - taking Gabapentin 400mg  TID - Taking Baclofen 1 in AM and 2 PM - Also Meloxicam 15mg  PRN. Acetaminophen 650mg  1-3 times PRN, BC powder  PRN headache - She reports additional complaint that she has chronic neck stiffness and pain, previously  followed by chiropractor, but now no longer goes back. 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 Takes Nexium 40mg  BID  - Denies any abdominal pain, nausea, dysphagia, dark stools  PMH Hypothyroidism - request refill levothyroxine, DM2 due for A1c, concern about Metformin dosing.  Depression screen [facility] 2/9 01/14/2017 10/01/2016 06/15/2016  Decreased Interest 1 3 0  Down, Depressed, Hopeless 1 3 0  PHQ - 2 Score 2 6 0  Altered sleeping 2 3 -  Tired, decreased energy 2 3 -  Change in appetite 0 2 -  Feeling bad or failure about yourself  0 1 -  Trouble concentrating 0 0 -  Moving slowly or fidgety/restless 0 0 -  Suicidal thoughts 0 0 -  PHQ-9 Score 6 15 -  Difficult doing work/chores - - -   GAD 7 : Generalized Anxiety Score 10/01/2016  Nervous, Anxious, on Edge 3  Control/stop worrying 3  Worry too much - different things 2  Trouble relaxing 0  Restless 0  Easily annoyed or irritable 3  Afraid - awful might happen 2  Total GAD 7 Score 13  Anxiety Difficulty Not difficult at all    Social History  Substance Use Topics  . Smoking status: Former Smoker    Packs/day: 1.50    Years: 35.00    Types:  Cigarettes    Quit date: 02/24/2014  . Smokeless tobacco: Never Used  . Alcohol use No    Review of Systems Per HPI unless specifically indicated above     Objective:    BP 138/84 (BP Location: Left Arm, Cuff Size: Normal)   Pulse 87   Temp 99.3 F (37.4 C) (Oral)   Resp 16   Ht 5\' 2"  (1.575 m)   Wt 228 lb 3.2 oz (103.5 kg)   BMI 41.74 kg/m   Wt Readings from Last 3 Encounters:  01/14/17 228 lb 3.2 oz (103.5 kg)  10/19/16 215 lb 6 oz (97.7 kg)  10/01/16 225 lb 9.6 oz (102.3 kg)    Physical Exam  Constitutional: She is oriented to person,  place, and time. She appears well-developed and well-nourished. No distress.  Well-appearing, comfortable, cooperative  HENT:  Head: Normocephalic and atraumatic.  Mouth/Throat: Oropharynx is clear and moist.  Eyes: Conjunctivae are normal. Right eye exhibits no discharge. Left eye exhibits no discharge.  Neck: Normal range of motion. Neck supple. No thyromegaly present.  Neck Inspection: normal appearance Palpation: mild tenderness paraspinal muscles posteriorly ROM: limited rotation, mostly intact flex/ext Strength: distal upper ext intact Neurovascular: distal upper ext intact  Cardiovascular: Normal rate, regular rhythm, normal heart sounds and intact distal pulses.   No murmur heard. Pulmonary/Chest: Effort normal and breath sounds normal. No respiratory distress. She has no wheezes. She has no rales.  Musculoskeletal: Normal range of motion. She exhibits edema (Mild trace non pitting edema bilateral lower extremities, R>L).  Right lower extremity - Bulky R ankle joint from prior injury, stable with some stiffness. Non-tender. No erythema  Lymphadenopathy:    She has no cervical adenopathy.  Neurological: She is alert and oriented to person, place, and time.  Skin: Skin is warm and dry. No rash noted. She is not diaphoretic. No erythema.  Psychiatric: She has a normal mood and affect. Her behavior is normal.  Well groomed, good eye contact, normal speech and thoughts. Improved mood, without any crying spells or labile mood. Congruent affect. Appears only mildly anxious.  Nursing note and vitals reviewed.    Diabetic Foot Exam - Simple   Simple Foot Form Diabetic Foot exam was performed with the following findings:  Yes 01/14/2017  2:18 PM  Visual Inspection No deformities, no ulcerations, no other skin breakdown bilaterally:  Yes Sensation Testing Intact to touch and monofilament testing bilaterally:  Yes Pulse Check Posterior Tibialis and Dorsalis pulse intact bilaterally:   Yes Comments R ankle with chronic swelling and bony deformity     Results for orders placed or performed in visit on 01/14/17  POCT glycosylated hemoglobin (Hb A1C)  Result Value Ref Range   Hemoglobin A1C 6.7       Assessment & Plan:   Problem List Items Addressed This Visit    Type 2 diabetes, controlled, with peripheral neuropathy ([place])    Well-controlled DM with A1c 6.7 (improved from 7s) Complications - peripheral neuropathy  Plan:  1. Continue current therapy - Metformin 500mg  BID - consider XR vs titrate down dose in future, vs switch to GLP1 - interested also for wt loss, medicaid likely trulicity vs victoza preferred, will notify office or at next visit consider rx new med 2. Encourage improved lifestyle - low carb, low sugar diet, reduce portion size, continue improving regular exercise 3. Consider future ASA, Statin, ACEi 5. DM Foot exam done today. Advised to schedule DM ophtho exam, send record 6. Follow-up 3 months  dedicated DM follow-up A1c      Relevant Medications   citalopram (CELEXA) 40 MG tablet   gabapentin (NEURONTIN) 400 MG capsule   metFORMIN (GLUCOPHAGE) 500 MG tablet   Neuropathy    Stable chronic problem, bilateral lower ext, suspected secondary to DM and also prior MSK injury fracture also chronic edema - Refilled Gabapentin, baclofen - continue to take regularly since improved. May continue Amitriptyline      Relevant Medications   gabapentin (NEURONTIN) 400 MG capsule   Other Relevant Orders   Ambulatory referral to Vascular Surgery   Major depression, recurrent ([place]) - Primary    Improved currently stable MDD, likely multifactorial with anxiety, sedentary lifestyle PHQ9: 6 (from 15) / GAD not scored. - Failed: Paxil, Effexor (intolerance) - No prior Psych / counseling, declined  Plan: 1. Discussion on complexity of mental health management with home self titration and switching of meds - without appropriate office follow-up as advised  instead of 4-6 weeks, she returned in >3 months 2. Discontinue Venlafaxine / Refilled Celexa 40mg  daily #90 +1 refill total 6 mo supply, needs to follow-up as advised for refills 3. Advised recommend therapy / counseling in future - again declined 4. Follow-up 3 months med adjust, GAD7/PHQ9      Relevant Medications   citalopram (CELEXA) 40 MG tablet   Hypothyroidism    Controlled hypothyroid on levothyroxine Last TSH normal in 06/2016 Refilled Levothyroxine 158mcg daily Check future TSH in 06/2017      Relevant Medications   levothyroxine (SYNTHROID, LEVOTHROID) 175 MCG tablet   GERD (gastroesophageal reflux disease)    Stable, controlled on high dose PPI Nexium 40mg  BID, uncertain why on high dose, has not tried to reduce Concern with NSAID use Refilled Nexium at 40mg  daily, instead of BID Follow-up PRN      Relevant Medications   esomeprazole (NEXIUM) 40 MG capsule   GAD (generalized anxiety disorder)    Stable to improved See MDD      Relevant Medications   citalopram (CELEXA) 40 MG tablet   Bilateral edema of lower extremity    Stable chronic bilateral LE edema R>L due to prior ortho injury, concern possible lymphedema with prior surgery. - Complicated by obesity, limited exercise  Plan: 1. Referral to [facility] further evaluation, patient concern about imaging for LE, no evidence of DVT clinically, reassurance given. Suspect may benefit from more aggressive compression therapy consider alternative management instead of Furosemide - counseling on risk to kidneys with long-term use      Relevant Orders   Ambulatory referral to Vascular Surgery      Meds ordered this encounter  Medications  . ketoconazole (NIZORAL) 2 % shampoo    Refill:  0  . Calcium Carbonate-Vit D-Min (CALCIUM 600+D PLUS MINERALS PO)    Sig: Take by mouth.  . citalopram (CELEXA) 40 MG tablet    Sig: Take 1 tablet (40 mg total) by mouth daily.    Dispense:  90 tablet     Refill:  1  . esomeprazole (NEXIUM) 40 MG capsule    Sig: Take 1 capsule (40 mg total) by mouth daily before breakfast.    Dispense:  90 capsule    Refill:  3  . gabapentin (NEURONTIN) 400 MG capsule    Sig: Take 1 capsule (400 mg total) by mouth 3 (three) times daily.    Dispense:  270 capsule    Refill:  3  . metFORMIN (GLUCOPHAGE) 500 MG tablet  Sig: Take 1 tablet (500 mg total) by mouth 2 (two) times daily with a meal.    Dispense:  60 tablet    Refill:  5  . levothyroxine (SYNTHROID, LEVOTHROID) 175 MCG tablet    Sig: Take 1 tablet (175 mcg total) by mouth daily.    Dispense:  90 tablet    Refill:  3    Follow up plan: Return in about 3 months (around 04/16/2017) for diabetes.  Nobie Putnam, [facility] 01/14/2017, 10:25 PM

## 2017-01-14 NOTE — Assessment & Plan Note (Signed)
Improved currently stable MDD, likely multifactorial with anxiety, sedentary lifestyle PHQ9: 6 (from 15) / GAD not scored. - Failed: Paxil, Effexor (intolerance) - No prior Psych / counseling, declined  Plan: 1. Discussion on complexity of mental health management with home self titration and switching of meds - without appropriate office follow-up as advised instead of 4-6 weeks, she returned in >3 months 2. Discontinue Venlafaxine / Refilled Celexa 40mg  daily #90 +1 refill total 6 mo supply, needs to follow-up as advised for refills 3. Advised recommend therapy / counseling in future - again declined 4. Follow-up 3 months med adjust, GAD7/PHQ9

## 2017-01-14 NOTE — Assessment & Plan Note (Signed)
Stable, controlled on high dose PPI Nexium 40mg  BID, uncertain why on high dose, has not tried to reduce Concern with NSAID use Refilled Nexium at 40mg  daily, instead of BID Follow-up PRN

## 2017-01-18 ENCOUNTER — Other Ambulatory Visit: Payer: Self-pay

## 2017-01-18 DIAGNOSIS — F418 Other specified anxiety disorders: Secondary | ICD-10-CM

## 2017-01-18 DIAGNOSIS — J431 Panlobular emphysema: Secondary | ICD-10-CM

## 2017-01-18 MED ORDER — BUDESONIDE-FORMOTEROL FUMARATE 160-4.5 MCG/ACT IN AERO: 2.0000 | INHALATION_SPRAY | Freq: Two times a day (BID) | RESPIRATORY_TRACT | 12 refills | Status: DC

## 2017-01-18 MED ORDER — AMITRIPTYLINE HCL 25 MG PO TABS: 12.5000 mg | ORAL_TABLET | Freq: Every day | ORAL | 6 refills | Status: DC

## 2017-01-18 NOTE — Telephone Encounter (Signed)
Refills are appropriate from last office visit discussion. Sent refills to [facility] in [place] by patient request from recent visit, not [facility] in [place], [facility] 01/18/2017, 3:26 PM

## 2017-01-18 NOTE — Telephone Encounter (Signed)
We received refill request for these medications. Please advise

## 2017-01-19 ENCOUNTER — Other Ambulatory Visit: Payer: Self-pay | Admitting: Family Medicine

## 2017-01-19 DIAGNOSIS — J431 Panlobular emphysema: Secondary | ICD-10-CM

## 2017-01-20 ENCOUNTER — Other Ambulatory Visit: Payer: Self-pay | Admitting: Family Medicine

## 2017-01-20 DIAGNOSIS — E1142 Type 2 diabetes mellitus with diabetic polyneuropathy: Secondary | ICD-10-CM

## 2017-01-20 MED ORDER — DULAGLUTIDE 0.75 MG/0.5ML ~~LOC~~ SOAJ: 0.7500 mg | SUBCUTANEOUS | 3 refills | Status: DC

## 2017-01-20 NOTE — Telephone Encounter (Signed)
Agree with plan. We discussed at last visit. Sent new rx Trulicity 0.75mg  weekly injection #4 pens +3 refills sent to [facility] in [place].  Follow-up as planned.  Nobie Putnam, [facility] 01/20/2017, 4:51 PM

## 2017-01-20 NOTE — Telephone Encounter (Signed)
Pt. Called requesting  trulicity to replace her metformin

## 2017-01-21 ENCOUNTER — Other Ambulatory Visit: Payer: Medicaid Other

## 2017-01-24 ENCOUNTER — Other Ambulatory Visit: Payer: Self-pay | Admitting: Family Medicine

## 2017-01-24 DIAGNOSIS — M542 Cervicalgia: Secondary | ICD-10-CM | POA: Insufficient documentation

## 2017-01-24 DIAGNOSIS — G8929 Other chronic pain: Secondary | ICD-10-CM | POA: Insufficient documentation

## 2017-01-24 DIAGNOSIS — Z720 Tobacco use: Secondary | ICD-10-CM

## 2017-01-24 MED ORDER — EXENATIDE ER 2 MG ~~LOC~~ PEN: 2.0000 mg | PEN_INJECTOR | SUBCUTANEOUS | 3 refills | Status: DC

## 2017-01-24 NOTE — Telephone Encounter (Signed)
Received notice that rx for Trulicity not covered by Medicaid. Reviewed current medicaid PDL, and listed preferred options are Bydureon (old pen) and Tanezum. Will switch to the Bydureon (original pen) for once weekly dosing and not need to change dose, continue at stable 2mg  injection once weekly.  New rx sent to pharmacy.  Nobie Putnam, [facility] 01/24/2017, 11:51 AM

## 2017-01-24 NOTE — Addendum Note (Signed)
Addended by: [name] on: 01/24/2017 11:53 AM   Modules accepted: Orders

## 2017-01-25 ENCOUNTER — Other Ambulatory Visit: Payer: Self-pay

## 2017-01-25 DIAGNOSIS — M542 Cervicalgia: Principal | ICD-10-CM

## 2017-01-25 DIAGNOSIS — G8929 Other chronic pain: Secondary | ICD-10-CM

## 2017-01-25 DIAGNOSIS — Z720 Tobacco use: Secondary | ICD-10-CM

## 2017-01-25 NOTE — Telephone Encounter (Signed)
Pt advised that metformin is only option since Tanezum will be D/C soon and other is not covered by her insurance and we can send referral for neurosurgeon for her chronic neck pain.

## 2017-01-25 NOTE — Progress Notes (Signed)
Pt advised to take metformin since Tanzeum is D/C by [facility] and pt requested neurosurgeon referral will place the order.

## 2017-01-27 ENCOUNTER — Other Ambulatory Visit: Payer: Self-pay | Admitting: Specialist

## 2017-01-27 DIAGNOSIS — R942 Abnormal results of pulmonary function studies: Secondary | ICD-10-CM

## 2017-01-27 DIAGNOSIS — R0602 Shortness of breath: Secondary | ICD-10-CM

## 2017-01-27 DIAGNOSIS — J439 Emphysema, unspecified: Secondary | ICD-10-CM

## 2017-01-28 ENCOUNTER — Ambulatory Visit
Admission: RE | Admit: 2017-01-28 | Discharge: 2017-01-28 | Disposition: A | Discharge status: Home / Self Care | Payer: Medicaid Other | Source: Ambulatory Visit | Attending: Family Medicine | Admitting: Family Medicine

## 2017-01-28 DIAGNOSIS — N6324 Unspecified lump in the left breast, lower inner quadrant: Secondary | ICD-10-CM | POA: Insufficient documentation

## 2017-01-28 DIAGNOSIS — N641 Fat necrosis of breast: Secondary | ICD-10-CM | POA: Diagnosis not present

## 2017-01-28 DIAGNOSIS — R928 Other abnormal and inconclusive findings on diagnostic imaging of breast: Secondary | ICD-10-CM

## 2017-01-31 ENCOUNTER — Other Ambulatory Visit: Payer: Self-pay | Admitting: Family Medicine

## 2017-01-31 DIAGNOSIS — N6320 Unspecified lump in the left breast, unspecified quadrant: Secondary | ICD-10-CM

## 2017-01-31 DIAGNOSIS — R928 Other abnormal and inconclusive findings on diagnostic imaging of breast: Secondary | ICD-10-CM

## 2017-02-01 ENCOUNTER — Other Ambulatory Visit: Payer: Self-pay | Admitting: Cardiology

## 2017-02-02 ENCOUNTER — Encounter
Admission: RE | Disposition: A | Discharge status: Home / Self Care | Payer: Self-pay | Source: Ambulatory Visit | Attending: Cardiology

## 2017-02-02 SURGERY — LEFT HEART CATH AND CORONARY ANGIOGRAPHY
Anesthesia: Moderate Sedation

## 2017-02-07 ENCOUNTER — Ambulatory Visit
Admission: RE | Admit: 2017-02-07 | Discharge: 2017-02-07 | Disposition: A | Discharge status: Home / Self Care | Payer: Medicaid Other | Source: Ambulatory Visit | Attending: Specialist | Admitting: Specialist

## 2017-02-07 ENCOUNTER — Encounter: Payer: Self-pay | Admitting: *Deleted

## 2017-02-07 ENCOUNTER — Ambulatory Visit
Admission: RE | Admit: 2017-02-07 | Discharge: 2017-02-07 | Disposition: A | Discharge status: Home / Self Care | Payer: Medicaid Other | Source: Ambulatory Visit | Attending: Cardiology | Admitting: Cardiology

## 2017-02-07 ENCOUNTER — Encounter
Admission: RE | Disposition: A | Discharge status: Home / Self Care | Payer: Self-pay | Source: Ambulatory Visit | Attending: Cardiology

## 2017-02-07 DIAGNOSIS — Z7951 Long term (current) use of inhaled steroids: Secondary | ICD-10-CM | POA: Diagnosis not present

## 2017-02-07 DIAGNOSIS — F419 Anxiety disorder, unspecified: Secondary | ICD-10-CM | POA: Diagnosis not present

## 2017-02-07 DIAGNOSIS — Z7982 Long term (current) use of aspirin: Secondary | ICD-10-CM | POA: Diagnosis not present

## 2017-02-07 DIAGNOSIS — Z7984 Long term (current) use of oral hypoglycemic drugs: Secondary | ICD-10-CM | POA: Diagnosis not present

## 2017-02-07 DIAGNOSIS — M1990 Unspecified osteoarthritis, unspecified site: Secondary | ICD-10-CM | POA: Diagnosis not present

## 2017-02-07 DIAGNOSIS — E039 Hypothyroidism, unspecified: Secondary | ICD-10-CM | POA: Diagnosis not present

## 2017-02-07 DIAGNOSIS — R0602 Shortness of breath: Secondary | ICD-10-CM

## 2017-02-07 DIAGNOSIS — K219 Gastro-esophageal reflux disease without esophagitis: Secondary | ICD-10-CM | POA: Diagnosis not present

## 2017-02-07 DIAGNOSIS — R942 Abnormal results of pulmonary function studies: Secondary | ICD-10-CM

## 2017-02-07 DIAGNOSIS — I10 Essential (primary) hypertension: Secondary | ICD-10-CM | POA: Insufficient documentation

## 2017-02-07 DIAGNOSIS — R079 Chest pain, unspecified: Secondary | ICD-10-CM | POA: Insufficient documentation

## 2017-02-07 DIAGNOSIS — J449 Chronic obstructive pulmonary disease, unspecified: Secondary | ICD-10-CM | POA: Insufficient documentation

## 2017-02-07 DIAGNOSIS — J439 Emphysema, unspecified: Secondary | ICD-10-CM

## 2017-02-07 DIAGNOSIS — Z87891 Personal history of nicotine dependence: Secondary | ICD-10-CM | POA: Diagnosis not present

## 2017-02-07 DIAGNOSIS — E119 Type 2 diabetes mellitus without complications: Secondary | ICD-10-CM | POA: Insufficient documentation

## 2017-02-07 HISTORY — PX: LEFT HEART CATH AND CORONARY ANGIOGRAPHY: CATH118249

## 2017-02-07 SURGERY — LEFT HEART CATH AND CORONARY ANGIOGRAPHY
Anesthesia: Moderate Sedation

## 2017-02-07 MED ORDER — MIDAZOLAM HCL 2 MG/2ML IJ SOLN
INTRAMUSCULAR | Status: AC
  Filled 2017-02-07: qty 2

## 2017-02-07 MED ORDER — SODIUM CHLORIDE 0.9% FLUSH
3.0000 mL | INTRAVENOUS | Status: DC | PRN
Start: 2017-02-07 — End: 2017-02-07

## 2017-02-07 MED ORDER — SODIUM CHLORIDE 0.9% FLUSH: 3.0000 mL | Freq: Two times a day (BID) | INTRAVENOUS | Status: DC

## 2017-02-07 MED ORDER — MIDAZOLAM HCL 2 MG/2ML IJ SOLN
INTRAMUSCULAR | Status: DC | PRN
  Administered 2017-02-07: 1 mg via INTRAVENOUS
  Administered 2017-02-07: 0.5 mg via INTRAVENOUS

## 2017-02-07 MED ORDER — IOPAMIDOL (ISOVUE-300) INJECTION 61%
INTRAVENOUS | Status: DC | PRN
  Administered 2017-02-07: 90 mL via INTRA_ARTERIAL

## 2017-02-07 MED ORDER — HEPARIN (PORCINE) IN NACL 2-0.9 UNIT/ML-% IJ SOLN
INTRAMUSCULAR | Status: AC
  Filled 2017-02-07: qty 500

## 2017-02-07 MED ORDER — SODIUM CHLORIDE 0.9 % WEIGHT BASED INFUSION: 1.0000 mL/kg/h | INTRAVENOUS | Status: DC

## 2017-02-07 MED ORDER — SODIUM CHLORIDE 0.9 % IV SOLN: 250.0000 mL | INTRAVENOUS | Status: DC | PRN

## 2017-02-07 MED ORDER — SODIUM CHLORIDE 0.9% FLUSH
3.0000 mL | Freq: Two times a day (BID) | INTRAVENOUS | Status: DC
  Administered 2017-02-07: 3 mL via INTRAVENOUS

## 2017-02-07 MED ORDER — SODIUM CHLORIDE 0.9 % WEIGHT BASED INFUSION
3.0000 mL/kg/h | INTRAVENOUS | Status: DC
  Administered 2017-02-07: 3 mL/kg/h via INTRAVENOUS

## 2017-02-07 MED ORDER — SODIUM CHLORIDE 0.9% FLUSH: 3.0000 mL | INTRAVENOUS | Status: DC | PRN

## 2017-02-07 MED ORDER — ONDANSETRON HCL 4 MG/2ML IJ SOLN: 4.0000 mg | Freq: Four times a day (QID) | INTRAMUSCULAR | Status: DC | PRN

## 2017-02-07 MED ORDER — FENTANYL CITRATE (PF) 100 MCG/2ML IJ SOLN
INTRAMUSCULAR | Status: DC | PRN
  Administered 2017-02-07: 50 ug via INTRAVENOUS
  Administered 2017-02-07: 25 ug via INTRAVENOUS

## 2017-02-07 MED ORDER — SODIUM CHLORIDE 0.9 % IV SOLN
250.0000 mL | INTRAVENOUS | Status: DC | PRN
Start: 2017-02-07 — End: 2017-02-07

## 2017-02-07 MED ORDER — FENTANYL CITRATE (PF) 100 MCG/2ML IJ SOLN
INTRAMUSCULAR | Status: AC
  Filled 2017-02-07: qty 2

## 2017-02-07 MED ORDER — ACETAMINOPHEN 325 MG PO TABS: 650.0000 mg | ORAL_TABLET | ORAL | Status: DC | PRN

## 2017-02-07 MED ORDER — ASPIRIN 81 MG PO CHEW: 81.0000 mg | CHEWABLE_TABLET | ORAL | Status: DC

## 2017-02-07 SURGICAL SUPPLY — 10 items
CATH 5FR JR4 DIAGNOSTIC (CATHETERS) ×1 IMPLANT
CATH INFINITI 5FR ANG PIGTAIL (CATHETERS) ×1 IMPLANT
CATH INFINITI 5FR JL4 (CATHETERS) ×1 IMPLANT
DEVICE CLOSURE MYNXGRIP 5F (Vascular Products) ×1 IMPLANT
GUIDEWIRE 3MM J TIP .035 145 (WIRE) ×1 IMPLANT
KIT MANI 3VAL PERCEP (MISCELLANEOUS) ×2 IMPLANT
NDL PERC 18GX7CM (NEEDLE) IMPLANT
NEEDLE PERC 18GX7CM (NEEDLE) ×2 IMPLANT
PACK CARDIAC CATH (CUSTOM PROCEDURE TRAY) ×2 IMPLANT
SHEATH AVANTI 5FR X 11CM (SHEATH) ×1 IMPLANT

## 2017-02-07 NOTE — Discharge Instructions (Signed)
Moderate Conscious Sedation, Adult °Sedation is the use of medicines to promote relaxation and relieve discomfort and anxiety. Moderate conscious sedation is a type of sedation. Under moderate conscious sedation, you are less alert than normal, but you are still able to respond to instructions, touch, or both. °Moderate conscious sedation is used during short medical and dental procedures. It is milder than deep sedation, which is a type of sedation under which you cannot be easily woken up. It is also milder than general anesthesia, which is the use of medicines to make you unconscious. Moderate conscious sedation allows you to return to your regular activities sooner. °Tell a health care provider about: °· Any allergies you have. °· All medicines you are taking, including vitamins, herbs, eye drops, creams, and over-the-counter medicines. °· Use of steroids (by mouth or creams). °· Any problems you or family members have had with sedatives and anesthetic medicines. °· Any blood disorders you have. °· Any surgeries you have had. °· Any medical conditions you have, such as sleep apnea. °· Whether you are pregnant or may be pregnant. °· Any use of cigarettes, alcohol, marijuana, or street drugs. °What are the risks? °Generally, this is a safe procedure. However, problems may occur, including: °· Getting too much medicine (oversedation). °· Nausea. °· Allergic reaction to medicines. °· Trouble breathing. If this happens, a breathing tube may be used to help with breathing. It will be removed when you are awake and breathing on your own. °· Heart trouble. °· Lung trouble. ° °What happens before the procedure? °Staying hydrated °Follow instructions from your health care provider about hydration, which may include: °· Up to 2 hours before the procedure - you may continue to drink clear liquids, such as water, clear fruit juice, black coffee, and plain tea. ° °Eating and drinking restrictions °Follow instructions from  your health care provider about eating and drinking, which may include: °· 8 hours before the procedure - stop eating heavy meals or foods such as meat, fried foods, or fatty foods. °· 6 hours before the procedure - stop eating light meals or foods, such as toast or cereal. °· 6 hours before the procedure - stop drinking milk or drinks that contain milk. °· 2 hours before the procedure - stop drinking clear liquids. ° °Medicine ° °Ask your health care provider about: °· Changing or stopping your regular medicines. This is especially important if you are taking diabetes medicines or blood thinners. °· Taking medicines such as aspirin and ibuprofen. These medicines can thin your blood. Do not take these medicines before your procedure if your health care provider instructs you not to. ° °Tests and exams °· You will have a physical exam. °· You may have blood tests done to show: °? How well your kidneys and liver are working. °? How well your blood can clot. °General instructions °· Plan to have someone take you home from the hospital or clinic. °· If you will be going home right after the procedure, plan to have someone with you for 24 hours. °What happens during the procedure? °· An IV tube will be inserted into one of your veins. °· Medicine to help you relax (sedative) will be given through the IV tube. °· The medical or dental procedure will be performed. °What happens after the procedure? °· Your blood pressure, heart rate, breathing rate, and blood oxygen level will be monitored often until the medicines you were given have worn off. °· Do not drive for 24 hours. °  This information is not intended to replace advice given to you by your health care provider. Make sure you discuss any questions you have with your health care provider. °Document Released: 03/23/2001 Document Revised: 12/02/2015 Document Reviewed: 10/18/2015 °Elsevier Interactive Patient Education © 2018 [employer] °Angiogram, Care After °This  sheet gives you information about how to care for yourself after your procedure. Your health care provider may also give you more specific instructions. If you have problems or questions, contact your health care provider. °What can I expect after the procedure? °After the procedure, it is common to have bruising and tenderness at the catheter insertion area. °Follow these instructions at home: °Insertion site care °· Follow instructions from your health care provider about how to take care of your insertion site. Make sure you: °? Wash your hands with soap and water before you change your bandage (dressing). If soap and water are not available, use hand sanitizer. °? Change your dressing as told by your health care provider. °? Leave stitches (sutures), skin glue, or adhesive strips in place. These skin closures may need to stay in place for 2 weeks or longer. If adhesive strip edges start to loosen and curl up, you may trim the loose edges. Do not remove adhesive strips completely unless your health care provider tells you to do that. °· Do not take baths, swim, or use a hot tub until your health care provider approves. °· You may shower 24-48 hours after the procedure or as told by your health care provider. °? Gently wash the site with plain soap and water. °? Pat the area dry with a clean towel. °? Do not rub the site. This may cause bleeding. °· Do not apply powder or lotion to the site. Keep the site clean and dry. °· Check your insertion site every day for signs of infection. Check for: °? Redness, swelling, or pain. °? Fluid or blood. °? Warmth. °? Pus or a bad smell. °Activity °· Rest as told by your health care provider, usually for 1-2 days. °· Do not lift anything that is heavier than 10 lbs. (4.5 kg) or as told by your health care provider. °· Do not drive for 24 hours if you were given a medicine to help you relax (sedative). °· Do not drive or use heavy machinery while taking prescription pain  medicine. °General instructions °· Return to your normal activities as told by your health care provider, usually in about a week. Ask your health care provider what activities are safe for you. °· If the catheter site starts bleeding, lie flat and put pressure on the site. If the bleeding does not stop, get help right away. This is a medical emergency. °· Drink enough fluid to keep your urine clear or pale yellow. This helps flush the contrast dye from your body. °· Take over-the-counter and prescription medicines only as told by your health care provider. °· Keep all follow-up visits as told by your health care provider. This is important. °Contact a health care provider if: °· You have a fever or chills. °· You have redness, swelling, or pain around your insertion site. °· You have fluid or blood coming from your insertion site. °· The insertion site feels warm to the touch. °· You have pus or a bad smell coming from your insertion site. °· You have bruising around the insertion site. °· You notice blood collecting in the tissue around the catheter site (hematoma). The hematoma may be painful to   the touch. °Get help right away if: °· You have severe pain at the catheter insertion area. °· The catheter insertion area swells very fast. °· The catheter insertion area is bleeding, and the bleeding does not stop when you hold steady pressure on the area. °· The area near or just beyond the catheter insertion site becomes pale, cool, tingly, or numb. °These symptoms may represent a serious problem that is an emergency. Do not wait to see if the symptoms will go away. Get medical help right away. Call your local emergency services (911 in the [place] Do not drive yourself to the hospital. °Summary °· After the procedure, it is common to have bruising and tenderness at the catheter insertion area. °· After the procedure, it is important to rest and drink plenty of fluids. °· Do not take baths, swim, or use a hot tub until  your health care provider says it is okay to do so. You may shower 24-48 hours after the procedure or as told by your health care provider. °· If the catheter site starts bleeding, lie flat and put pressure on the site. If the bleeding does not stop, get help right away. This is a medical emergency. °This information is not intended to replace advice given to you by your health care provider. Make sure you discuss any questions you have with your health care provider. °Document Released: 01/14/2005 Document Revised: 06/02/2016 Document Reviewed: 06/02/2016 °Elsevier Interactive Patient Education © 2017 Elsevier Inc. ° °

## 2017-02-07 NOTE — OR Nursing (Signed)
Pt instructions gone over with her mom and her. Right groin site is clean without bleeding or hematoma. Pt is discharged to home in wheel chair.

## 2017-02-08 ENCOUNTER — Ambulatory Visit (INDEPENDENT_AMBULATORY_CARE_PROVIDER_SITE_OTHER): Payer: Medicaid Other | Admitting: Vascular Surgery

## 2017-02-08 ENCOUNTER — Telehealth (INDEPENDENT_AMBULATORY_CARE_PROVIDER_SITE_OTHER): Payer: Self-pay | Admitting: Vascular Surgery

## 2017-02-08 ENCOUNTER — Encounter: Payer: Self-pay | Admitting: Cardiology

## 2017-02-08 VITALS — BP 152/74 | HR 94 | Resp 16 | Ht 62.0 in | Wt 225.0 lb

## 2017-02-08 DIAGNOSIS — M7989 Other specified soft tissue disorders: Secondary | ICD-10-CM | POA: Insufficient documentation

## 2017-02-08 DIAGNOSIS — I10 Essential (primary) hypertension: Secondary | ICD-10-CM

## 2017-02-08 DIAGNOSIS — E1142 Type 2 diabetes mellitus with diabetic polyneuropathy: Secondary | ICD-10-CM | POA: Diagnosis not present

## 2017-02-08 NOTE — Assessment & Plan Note (Signed)
blood glucose control important in reducing the progression of atherosclerotic disease. Also, involved in wound healing. On appropriate medications. Certainly lower extremity neuropathy is exacerbating the situation and causing worsening pain as well.

## 2017-02-08 NOTE — Progress Notes (Signed)
Patient ID: Erin Potter, female   DOB: [birth year], 56 y.o.   MRN: [id number]  Chief Complaint  Patient presents with  . New Evaluation    edema    HPI Erin Potter is a 56 y.o. female.  I am asked to see the patient by Dr. Parks Ranger for evaluation of swelling.  The patient reports Steady progression of her lower extremity swelling over several months. This has been, a debilitating problem with severe pain on a daily basis. She reports no antecedent history of DVT or superficial thrombophlebitis to her knowledge. Her right leg is had multiple surgeries for fractures and is the worse of the 2 legs. She denies ulceration with infection. She has tried compression stockings without significant improvement.   Past Medical History:  Diagnosis Date  . Anxiety   . Asthma   . GERD (gastroesophageal reflux disease)   . Hypertension   . Hypothyroidism   . Neuromuscular disorder ([place])    LEFT ARM WEAKNESS-ULNAR NERVE ISSUE  . Shortness of breath dyspnea   . Sleep apnea    NO CPAP-ONLY FINISHED HALF TEST    Past Surgical History:  Procedure Laterality Date  . COLONOSCOPY WITH PROPOFOL N/A 02/21/2015   Procedure: COLONOSCOPY WITH PROPOFOL;  Surgeon: [name], MD;  Location: [facility];  Service: Endoscopy;  Laterality: N/A;  . LEFT HEART CATH AND CORONARY ANGIOGRAPHY N/A 02/07/2017   Procedure: Left Heart Cath and Coronary Angiography;  Surgeon: Isaias Cowman, MD;  Location: [facility];  Service: Cardiovascular;  Laterality: N/A;  . LEG SURGERY Right   . NECK SURGERY     EXPLORATORY  . POLYPECTOMY  02/21/2015   Procedure: POLYPECTOMY;  Surgeon: [name], MD;  Location: [facility];  Service: Endoscopy;;  . TONSILLECTOMY    . TUBAL LIGATION    . TUBALPLASTY     LAPAROSCOPY    Family History  Problem Relation Age of Onset  . Hypertension Mother   . Thyroid disease Mother   . Heart disease Father   . Diabetes Father   .  Stroke Father   . Heart attack Father   . Hypertension Maternal Grandmother   . Hypertension Maternal Grandfather   . Heart attack Maternal Grandfather   . Cancer Paternal Grandmother   . Alzheimer's disease Paternal Grandmother   . Breast cancer Neg Hx     Social History Social History  Substance Use Topics  . Smoking status: Former Smoker    Packs/day: 1.50    Years: 35.00    Types: Cigarettes    Quit date: 02/24/2014  . Smokeless tobacco: Never Used  . Alcohol use No  No IVDU  Allergies  Allergen Reactions  . Clindamycin Hives  . 2,4-D Dimethylamine (Amisol) Other (See Comments)    Unknown to pt  . Nyquil Multi-Symptom [Pseudoeph-Doxylamine-Dm-Apap] Hives    Current Outpatient Prescriptions  Medication Sig Dispense Refill  . ACCU-CHEK FASTCLIX LANCETS MISC TEST once daily 100 each 12  . ACCU-CHEK SMARTVIEW test strip TEST 4 TIMES DAILY BEFORE MEALS AND BEFORE BEDTIME. NEEDS FOLLOW UP APPOINTMENT 100 each 12  . albuterol (PROAIR HFA) 108 (90 Base) MCG/ACT inhaler Inhale 2 puffs into the lungs every 6 (six) hours as needed for wheezing or shortness of breath. 1 Inhaler 11  . amitriptyline (ELAVIL) 25 MG tablet Take 0.5 tablets (12.5 mg total) by mouth at bedtime. 15 tablet 6  . aspirin EC 81 MG tablet Take 81 mg by mouth at bedtime.    Marland Kitchen  Aspirin-Salicylamide-Caffeine (BC HEADACHE PO) Take 2 packets by mouth daily as needed (pain).    . baclofen (LIORESAL) 10 MG tablet Take 1 tabs twice daily and 2 additional tabs at bedtime. 180 each 6  . Blood Glucose Monitoring Suppl (ACCU-CHEK NANO SMARTVIEW) w/Device KIT 1 each by Does not apply route as directed. 1 kit 0  . budesonide-formoterol (SYMBICORT) 160-4.5 MCG/ACT inhaler Inhale 2 puffs into the lungs 2 (two) times daily. 1 Inhaler 12  . Calcium Carbonate-Vit D-Min (CALCIUM 600+D PLUS MINERALS PO) Take 1 tablet by mouth daily.     . chlorhexidine (PERIDEX) 0.12 % solution 5 mLs by Mouth Rinse route daily as needed (mouth  sores).   0  . citalopram (CELEXA) 40 MG tablet Take 1 tablet (40 mg total) by mouth daily. 90 tablet 1  . esomeprazole (NEXIUM) 40 MG capsule Take 1 capsule (40 mg total) by mouth daily before breakfast. 90 capsule 3  . Exenatide ER 2 MG PEN Inject 2 mg into the skin once a week. 4 each 3  . ferrous sulfate 325 (65 FE) MG tablet Take 325 mg by mouth daily.     . fluocinonide (LIDEX) 0.05 % external solution Apply 1 application topically daily as needed for irritation.  0  . fluoruracil (CARAC) 0.5 % cream Apply 0.5 mg topically daily as needed (skin spots).   0  . furosemide (LASIX) 20 MG tablet Alternate taking 1, 20 mg Lasix with 2, 20 mg tabs, every other day 45 tablet 6  . gabapentin (NEURONTIN) 400 MG capsule Take 1 capsule (400 mg total) by mouth 3 (three) times daily. (Patient taking differently: Take 400 mg by mouth 2 (two) times daily. May take an additional 400 mg as needed for pain) 270 capsule 3  . ipratropium-albuterol (DUONEB) 0.5-2.5 (3) MG/3ML SOLN Take 3 mLs by nebulization every 2 (two) hours as needed. (Patient taking differently: Take 3 mLs by nebulization every 2 (two) hours as needed (shortness of breath). ) 360 mL 12  . ketoconazole (NIZORAL) 2 % shampoo Apply 1 application topically 3 (three) times a week.   0  . levothyroxine (SYNTHROID, LEVOTHROID) 175 MCG tablet Take 1 tablet (175 mcg total) by mouth daily. (Patient taking differently: Take 175 mcg by mouth daily before breakfast. ) 90 tablet 3  . meloxicam (MOBIC) 15 MG tablet take 1 tablet by mouth once daily 30 tablet 6  . metFORMIN (GLUCOPHAGE) 500 MG tablet Take 1 tablet (500 mg total) by mouth 2 (two) times daily with a meal. 60 tablet 5  . Milk Thistle 500 MG CAPS Take 500 mg by mouth 2 (two) times daily.     . Multiple Vitamin (MULTIVITAMIN) tablet Take 1 tablet by mouth daily.    . Probiotic Product (PRO-BIOTIC BLEND PO) Take 1 capsule by mouth daily.    Marland Kitchen SPIRIVA HANDIHALER 18 MCG inhalation capsule inhale the  contents of one capsule in the handihaler once daily IN THE MORNING 30 capsule 12  . aspirin-acetaminophen-caffeine (EXCEDRIN MIGRAINE) 250-250-65 MG tablet Take 2 tablets by mouth every 8 (eight) hours as needed for headache.     No current facility-administered medications for this visit.       REVIEW OF SYSTEMS (Negative unless checked)  Constitutional: '[]' Weight loss  '[]' Fever  '[]' Chills Cardiac: '[]' Chest pain   '[]' Chest pressure   '[]' Palpitations   '[]' Shortness of breath when laying flat   '[]' Shortness of breath at rest   '[]' Shortness of breath with exertion. Vascular:  '[x]' Pain in legs with  walking   '[x]' Pain in legs at rest   '[x]' Pain in legs when laying flat   '[]' Claudication   '[]' Pain in feet when walking  '[]' Pain in feet at rest  '[]' Pain in feet when laying flat   '[]' History of DVT   '[]' Phlebitis   '[x]' Swelling in legs   '[]' Varicose veins   '[]' Non-healing ulcers Pulmonary:   '[]' Uses home oxygen   '[]' Productive cough   '[]' Hemoptysis   '[]' Wheeze  '[]' COPD   '[]' Asthma Neurologic:  '[]' Dizziness  '[]' Blackouts   '[]' Seizures   '[]' History of stroke   '[]' History of TIA  '[]' Aphasia   '[]' Temporary blindness   '[]' Dysphagia   '[]' Weakness or numbness in arms   '[]' Weakness or numbness in legs Musculoskeletal:  '[x]' Arthritis   '[x]' Joint swelling   '[]' Joint pain   '[]' Low back pain Hematologic:  '[]' Easy bruising  '[]' Easy bleeding   '[]' Hypercoagulable state   '[]' Anemic  '[]' Hepatitis Gastrointestinal:  '[]' Blood in stool   '[]' Vomiting blood  '[]' Gastroesophageal reflux/heartburn   '[]' Abdominal pain Genitourinary:  '[]' Chronic kidney disease   '[]' Difficult urination  '[]' Frequent urination  '[]' Burning with urination   '[]' Hematuria Skin:  '[]' Rashes   '[]' Ulcers   '[]' Wounds Psychological:  '[]' History of anxiety   '[]'  History of major depression.    Physical Exam BP (!) 152/74   Pulse 94   Resp 16   Ht '5\' 2"'  (1.575 m)   Wt 225 lb (102.1 kg)   BMI 41.15 kg/m  Gen:  WD/WN, NAD Head: [place]/AT, No temporalis wasting.  Ear/Nose/Throat: Hearing grossly intact, nares w/o  erythema or drainage, oropharynx w/o Erythema/Exudate Eyes: Conjunctiva clear, sclera non-icteric  Neck: trachea midline.  No JVD.  Pulmonary:  Good air movement, respirations not labored, no use of accessory muscles Cardiac: RRR, normal S1, S2 Vascular:  Vessel Right Left  Radial Palpable Palpable                          PT Trace Palpable Trace Palpable  DP 1+ Palpable 1+ Palpable   Gastrointestinal: soft, non-tender/non-distended.   Musculoskeletal: M/S 5/5 throughout.  Extremities without ischemic changes.  No deformity or atrophy. 2+ RLE edema, 1+ LLE edema. Stasis changes present bilaterally Neurologic: Sensation grossly intact in extremities.  Symmetrical.  Speech is fluent. Motor exam as listed above. Psychiatric: Judgment intact, Mood & affect appropriate for pt's clinical situation. Dermatologic: No rashes or ulcers noted.  No cellulitis or open wounds.  Radiology Ct Chest Wo Contrast  Result Date: 02/07/2017 CLINICAL DATA:  Shortness of breath. Status post cardiac catheterization today. Ex-smoker. EXAM: CT CHEST WITHOUT CONTRAST TECHNIQUE: Multidetector CT imaging of the chest was performed following the standard protocol without IV contrast. COMPARISON:  Chest radiographs dated 09/30/2014 FINDINGS: Cardiovascular: Small amount of atheromatous arterial calcification, including minimal aortic calcification. Normal sized heart. No pericardial effusion. Mediastinum/Nodes: No enlarged mediastinal or axillary lymph nodes. Thyroid gland, trachea, and esophagus demonstrate no significant findings. Lungs/Pleura: Mild bilateral bullous changes. No lung nodules or airspace consolidation. Minimal linear atelectasis or scarring at the lung bases. Upper Abdomen: Mildly lobulated liver contours with a prominent lateral segment left lobe and caudate lobe. The spleen is also prominent. Neither the liver nor the spleen are included in their entirety. Musculoskeletal: Mild thoracic spine  degenerative changes. IMPRESSION: 1. No acute abnormality. 2. 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 3. 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 This could be better determined with an abdomen and pelvis CT with contrast. 4. Minimal aortic atherosclerosis. Aortic Atherosclerosis (  ICD10-I70.0) and Emphysema (ICD10-J43.9). Electronically Signed   By: Claudie Revering M.D.   On: 02/07/2017 23:07   US Breast Ltd Uni Left Inc Axilla  Result Date: 01/28/2017 CLINICAL DATA:  56 year old female presenting for follow-up of bilateral probably benign masses. EXAM: 2D DIGITAL DIAGNOSTIC BILATERAL MAMMOGRAM WITH CAD AND ADJUNCT TOMO BILATERAL BREAST ULTRASOUND COMPARISON:  Previous exam(s). ACR Breast Density Category b: There are scattered areas of fibroglandular density. FINDINGS: The mass in the upper-outer quadrant of the right breast previously seen on in October of 2017 is no longer seen mammographically. The small mass in the inferior left breast appears mammographically stable. No new suspicious calcifications, masses or areas of distortion are seen in the bilateral breasts. Mammographic images were processed with CAD. Ultrasound of the right breast at 10 o'clock, 6 cm from the nipple demonstrates resolution of the mass previously seen at this location, consistent with resolved fat necrosis. Ultrasound of the left breast at 6 o'clock, 1 cm from the nipple demonstrates a stable oval hypoechoic mass measuring 4 x 2 x 3 mm, previously measuring 5 x 2 x 3 mm. IMPRESSION: 1. Resolution of the mass in the right breast compatible with resolved fat necrosis. 2.  Stable probably benign left breast mass at 6 o'clock. RECOMMENDATION: Though the mass in the left breast at 6 o'clock does have a probably benign appearance, the patient is concerned, and strongly prefers pursuing a biopsy rather than continued follow-up, which is a reasonable request. We will schedule this at her convenience. I have  discussed the findings and recommendations with the patient. Results were also provided in writing at the conclusion of the visit. If applicable, a reminder letter will be sent to the patient regarding the next appointment. BI-RADS CATEGORY  3: Probably benign. Electronically Signed   By: Ammie Ferrier M.D.   On: 01/28/2017 15:47   US Breast Ltd Uni Right Inc Axilla  Result Date: 01/28/2017 CLINICAL DATA:  56 year old female presenting for follow-up of bilateral probably benign masses. EXAM: 2D DIGITAL DIAGNOSTIC BILATERAL MAMMOGRAM WITH CAD AND ADJUNCT TOMO BILATERAL BREAST ULTRASOUND COMPARISON:  Previous exam(s). ACR Breast Density Category b: There are scattered areas of fibroglandular density. FINDINGS: The mass in the upper-outer quadrant of the right breast previously seen on in October of 2017 is no longer seen mammographically. The small mass in the inferior left breast appears mammographically stable. No new suspicious calcifications, masses or areas of distortion are seen in the bilateral breasts. Mammographic images were processed with CAD. Ultrasound of the right breast at 10 o'clock, 6 cm from the nipple demonstrates resolution of the mass previously seen at this location, consistent with resolved fat necrosis. Ultrasound of the left breast at 6 o'clock, 1 cm from the nipple demonstrates a stable oval hypoechoic mass measuring 4 x 2 x 3 mm, previously measuring 5 x 2 x 3 mm. IMPRESSION: 1. Resolution of the mass in the right breast compatible with resolved fat necrosis. 2.  Stable probably benign left breast mass at 6 o'clock. RECOMMENDATION: Though the mass in the left breast at 6 o'clock does have a probably benign appearance, the patient is concerned, and strongly prefers pursuing a biopsy rather than continued follow-up, which is a reasonable request. We will schedule this at her convenience. I have discussed the findings and recommendations with the patient. Results were also provided in  writing at the conclusion of the visit. If applicable, a reminder letter will be sent to the patient regarding the next appointment. BI-RADS CATEGORY  3: Probably benign. Electronically Signed   By: Ammie Ferrier M.D.   On: 01/28/2017 15:47   Mm Diag Breast Tomo Bilateral  Result Date: 01/28/2017 CLINICAL DATA:  56 year old female presenting for follow-up of bilateral probably benign masses. EXAM: 2D DIGITAL DIAGNOSTIC BILATERAL MAMMOGRAM WITH CAD AND ADJUNCT TOMO BILATERAL BREAST ULTRASOUND COMPARISON:  Previous exam(s). ACR Breast Density Category b: There are scattered areas of fibroglandular density. FINDINGS: The mass in the upper-outer quadrant of the right breast previously seen on in October of 2017 is no longer seen mammographically. The small mass in the inferior left breast appears mammographically stable. No new suspicious calcifications, masses or areas of distortion are seen in the bilateral breasts. Mammographic images were processed with CAD. Ultrasound of the right breast at 10 o'clock, 6 cm from the nipple demonstrates resolution of the mass previously seen at this location, consistent with resolved fat necrosis. Ultrasound of the left breast at 6 o'clock, 1 cm from the nipple demonstrates a stable oval hypoechoic mass measuring 4 x 2 x 3 mm, previously measuring 5 x 2 x 3 mm. IMPRESSION: 1. Resolution of the mass in the right breast compatible with resolved fat necrosis. 2.  Stable probably benign left breast mass at 6 o'clock. RECOMMENDATION: Though the mass in the left breast at 6 o'clock does have a probably benign appearance, the patient is concerned, and strongly prefers pursuing a biopsy rather than continued follow-up, which is a reasonable request. We will schedule this at her convenience. I have discussed the findings and recommendations with the patient. Results were also provided in writing at the conclusion of the visit. If applicable, a reminder letter will be sent to the  patient regarding the next appointment. BI-RADS CATEGORY  3: Probably benign. Electronically Signed   By: Ammie Ferrier M.D.   On: 01/28/2017 15:47    Labs Recent Results (from the past 2160 hour(s))  POCT glycosylated hemoglobin (Hb A1C)     Status: Normal   Collection Time: 01/14/17  3:08 PM  Result Value Ref Range   Hemoglobin A1C 6.7     Assessment/Plan:  Essential hypertension blood pressure control important in reducing the progression of atherosclerotic disease. On appropriate oral medications.   Type 2 diabetes, controlled, with peripheral neuropathy (HCC) blood glucose control important in reducing the progression of atherosclerotic disease. Also, involved in wound healing. On appropriate medications. Certainly lower extremity neuropathy is exacerbating the situation and causing worsening pain as well.  Swelling of limb I have had a long discussion with the patient regarding swelling and why it  causes symptoms.  Patient will begin wearing graduated compression stockings class 1 (20-30 mmHg) on a daily basis. The patient already has these, and I have recommended she wear them more frequently. The patient will  beginning wearing the stockings first thing in the morning and removing them in the evening. The patient is instructed specifically not to sleep in the stockings.   In addition, behavioral modification will be initiated.  This will include frequent elevation, use of over the counter pain medications and exercise such as walking.  I have reviewed systemic causes for chronic edema such as liver, kidney and cardiac etiologies.  The patient denies problems with these organ systems.    Consideration for a lymph pump will also be made based upon the effectiveness of conservative therapy.  This would help to improve the edema control and prevent sequela such as ulcers and infections. Given her multiple previous surgeries and issues with her  right leg, I think lymphedema is a  likely diagnosis.  Patient should undergo duplex ultrasound of the venous system to ensure that DVT or reflux is not present.  The patient will follow-up with me after the ultrasound.        Leotis Pain 02/08/2017, 5:00 PM   This note was created with Dragon medical transcription system.  Any errors from dictation are unintentional.

## 2017-02-08 NOTE — Assessment & Plan Note (Signed)
blood pressure control important in reducing the progression of atherosclerotic disease. On appropriate oral medications.  

## 2017-02-08 NOTE — Telephone Encounter (Signed)
Patient called to say she was confused about her appt time. States she received a reminder call that said 4 not 3. I advised her of the correct appt time which is for  3pm. She inquired about rescheduling. I advised her of the no show fee. She asked about who she would talk to about that. I advised her my administrator is not in today. She states she will try to make it.

## 2017-02-08 NOTE — Patient Instructions (Signed)

## 2017-02-08 NOTE — Assessment & Plan Note (Signed)
I have had a long discussion with the patient regarding swelling and why it  causes symptoms.  Patient will begin wearing graduated compression stockings class 1 (20-30 mmHg) on a daily basis. The patient already has these, and I have recommended she wear them more frequently. The patient will  beginning wearing the stockings first thing in the morning and removing them in the evening. The patient is instructed specifically not to sleep in the stockings.   In addition, behavioral modification will be initiated.  This will include frequent elevation, use of over the counter pain medications and exercise such as walking.  I have reviewed systemic causes for chronic edema such as liver, kidney and cardiac etiologies.  The patient denies problems with these organ systems.    Consideration for a lymph pump will also be made based upon the effectiveness of conservative therapy.  This would help to improve the edema control and prevent sequela such as ulcers and infections. Given her multiple previous surgeries and issues with her right leg, I think lymphedema is a likely diagnosis.  Patient should undergo duplex ultrasound of the venous system to ensure that DVT or reflux is not present.  The patient will follow-up with me after the ultrasound.

## 2017-02-09 ENCOUNTER — Ambulatory Visit
Admission: RE | Admit: 2017-02-09 | Discharge: 2017-02-09 | Disposition: A | Discharge status: Home / Self Care | Payer: Medicaid Other | Source: Ambulatory Visit | Attending: Family Medicine | Admitting: Family Medicine

## 2017-02-09 DIAGNOSIS — N6082 Other benign mammary dysplasias of left breast: Secondary | ICD-10-CM | POA: Insufficient documentation

## 2017-02-09 DIAGNOSIS — R928 Other abnormal and inconclusive findings on diagnostic imaging of breast: Secondary | ICD-10-CM

## 2017-02-09 DIAGNOSIS — N6320 Unspecified lump in the left breast, unspecified quadrant: Secondary | ICD-10-CM

## 2017-02-09 DIAGNOSIS — N62 Hypertrophy of breast: Secondary | ICD-10-CM | POA: Insufficient documentation

## 2017-02-09 HISTORY — PX: BREAST BIOPSY: SHX20

## 2017-02-10 LAB — SURGICAL PATHOLOGY

## 2017-02-14 ENCOUNTER — Telehealth: Payer: Self-pay | Admitting: Family Medicine

## 2017-02-14 NOTE — Telephone Encounter (Signed)
Pt asked if she was to follow up with colonoscopy in 2 or 3 years for polyps.  She said it has been 2 years.  Her call back number is [phone]

## 2017-02-14 NOTE — Telephone Encounter (Signed)
The pt was notified that she's not due for another colonoscopy until next year.

## 2017-02-17 ENCOUNTER — Other Ambulatory Visit: Payer: Self-pay

## 2017-02-17 DIAGNOSIS — K219 Gastro-esophageal reflux disease without esophagitis: Secondary | ICD-10-CM

## 2017-02-17 DIAGNOSIS — J431 Panlobular emphysema: Secondary | ICD-10-CM

## 2017-02-17 MED ORDER — ALBUTEROL SULFATE HFA 108 (90 BASE) MCG/ACT IN AERS: 2.0000 | INHALATION_SPRAY | Freq: Four times a day (QID) | RESPIRATORY_TRACT | 11 refills | Status: DC | PRN

## 2017-02-17 MED ORDER — ESOMEPRAZOLE MAGNESIUM 40 MG PO CPDR: 40.0000 mg | DELAYED_RELEASE_CAPSULE | Freq: Every day | ORAL | 3 refills | Status: DC

## 2017-02-24 ENCOUNTER — Other Ambulatory Visit: Payer: Self-pay | Admitting: Family Medicine

## 2017-02-24 DIAGNOSIS — F331 Major depressive disorder, recurrent, moderate: Secondary | ICD-10-CM

## 2017-02-24 DIAGNOSIS — F411 Generalized anxiety disorder: Secondary | ICD-10-CM

## 2017-03-04 ENCOUNTER — Encounter (INDEPENDENT_AMBULATORY_CARE_PROVIDER_SITE_OTHER): Payer: Self-pay | Admitting: Vascular Surgery

## 2017-03-04 ENCOUNTER — Ambulatory Visit (INDEPENDENT_AMBULATORY_CARE_PROVIDER_SITE_OTHER): Payer: Medicaid Other

## 2017-03-04 ENCOUNTER — Ambulatory Visit (INDEPENDENT_AMBULATORY_CARE_PROVIDER_SITE_OTHER): Payer: Medicaid Other | Admitting: Vascular Surgery

## 2017-03-04 ENCOUNTER — Other Ambulatory Visit: Payer: Self-pay | Admitting: Family Medicine

## 2017-03-04 VITALS — BP 149/81 | HR 95 | Resp 16 | Wt 224.8 lb

## 2017-03-04 DIAGNOSIS — I83892 Varicose veins of left lower extremities with other complications: Secondary | ICD-10-CM | POA: Insufficient documentation

## 2017-03-04 DIAGNOSIS — I10 Essential (primary) hypertension: Secondary | ICD-10-CM | POA: Diagnosis not present

## 2017-03-04 DIAGNOSIS — R600 Localized edema: Secondary | ICD-10-CM | POA: Diagnosis not present

## 2017-03-04 DIAGNOSIS — L309 Dermatitis, unspecified: Secondary | ICD-10-CM

## 2017-03-04 DIAGNOSIS — E1142 Type 2 diabetes mellitus with diabetic polyneuropathy: Secondary | ICD-10-CM | POA: Diagnosis not present

## 2017-03-04 DIAGNOSIS — M7989 Other specified soft tissue disorders: Secondary | ICD-10-CM | POA: Diagnosis not present

## 2017-03-04 NOTE — Patient Instructions (Signed)
Endovenous Ablation, Care After Refer to this sheet in the next few weeks. These instructions provide you with information about caring for yourself after your procedure. Your health care provider may also give you more specific instructions. Your treatment has been planned according to current medical practices, but problems sometimes occur. Call your health care provider if you have any problems or questions after your procedure. What can I expect after the procedure? After the procedure, it is common to have:  Bruising.  Tenderness.  Follow these instructions at home:  Incision care  Follow instructions from your health care provider about how to take care of your incision. Make sure you: ? Wash your hands with soap and water before you change your bandage (dressing). If soap and water are not available, use hand sanitizer. ? Change your dressing as told by your health care provider. ? Follow instructions from your health care provider about when you should remove your dressing.  Check your incision area every day for signs of infection. Watch for: ? Redness, swelling, or pain. ? Fluid, blood, or pus.  Keep the bandage (dressing) dry until your health care provider says it can be removed. Activity   Walk regularly as told by your health care provider. This helps with healing and helps to prevent blood clots from forming.  Avoid strenuous exercise as told by your health care provider. General instructions  Take over-the-counter and prescription medicines only as told by your health care provider.  Do not take long car trips or travel by air until your health care provider has approved.  Wear compression stockings as told by your health care provider. These stockings help to prevent blood clots and reduce swelling in your legs.  Keep all follow-up visits as told by your health care provider. This is important. Contact a health care provider if:  You have a fever.  You have  redness, swelling, or pain at the site of your incision.  You have fluid, blood, or pus coming from your incision. Get help right away if:  You notice red streaks coming from the incision.  You develop nausea or vomiting.  You have trouble breathing.  You develop chest pain. This information is not intended to replace advice given to you by your health care provider. Make sure you discuss any questions you have with your health care provider. Document Released: 06/17/2011 Document Revised: 12/04/2015 Document Reviewed: 10/01/2014 Elsevier Interactive Patient Education  [employer].

## 2017-03-04 NOTE — Assessment & Plan Note (Signed)
Her venous duplex today revealed no evidence of deep venous thrombosis or superficial thrombophlebitis in either lower extremity. The right leg did not have any venous reflux. The left great saphenous vein demonstrated reflux. As such, the patient would likely benefit from left great saphenous vein laser ablation. The right leg will need to be treated with compression, elevation, and potentially the addition of a lymphedema pump. Risks and benefits of laser ablation were discussed and she is agreeable to proceed.
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
## 2017-03-16 ENCOUNTER — Other Ambulatory Visit: Payer: Self-pay | Admitting: Student

## 2017-03-16 DIAGNOSIS — M542 Cervicalgia: Secondary | ICD-10-CM

## 2017-03-16 DIAGNOSIS — M79601 Pain in right arm: Secondary | ICD-10-CM

## 2017-03-23 ENCOUNTER — Other Ambulatory Visit: Payer: Self-pay

## 2017-03-23 MED ORDER — MELOXICAM 15 MG PO TABS: 15.0000 mg | ORAL_TABLET | Freq: Every day | ORAL | 0 refills | Status: DC

## 2017-03-25 ENCOUNTER — Ambulatory Visit
Admission: RE | Admit: 2017-03-25 | Discharge: 2017-03-25 | Disposition: A | Discharge status: Home / Self Care | Payer: Medicaid Other | Source: Ambulatory Visit | Attending: Student | Admitting: Student

## 2017-04-05 ENCOUNTER — Ambulatory Visit (INDEPENDENT_AMBULATORY_CARE_PROVIDER_SITE_OTHER): Payer: Medicaid Other | Admitting: Vascular Surgery

## 2017-04-05 ENCOUNTER — Encounter (INDEPENDENT_AMBULATORY_CARE_PROVIDER_SITE_OTHER): Payer: Medicaid Other

## 2017-04-08 ENCOUNTER — Telehealth (INDEPENDENT_AMBULATORY_CARE_PROVIDER_SITE_OTHER): Payer: Self-pay

## 2017-04-08 NOTE — Telephone Encounter (Signed)
Patient called asking how long she should use her lymphedema pump until she get the laser procedure in November. I spoke with KS and she said the patient can use the until the day of the laser procedure and the day of she can ask when she should restart using

## 2017-04-14 ENCOUNTER — Ambulatory Visit
Admission: RE | Admit: 2017-04-14 | Discharge: 2017-04-14 | Disposition: A | Discharge status: Home / Self Care | Payer: Medicaid Other | Source: Ambulatory Visit | Attending: Student | Admitting: Student

## 2017-04-14 DIAGNOSIS — M542 Cervicalgia: Secondary | ICD-10-CM | POA: Diagnosis not present

## 2017-04-14 DIAGNOSIS — M79601 Pain in right arm: Secondary | ICD-10-CM | POA: Diagnosis present

## 2017-04-14 DIAGNOSIS — M7989 Other specified soft tissue disorders: Secondary | ICD-10-CM | POA: Diagnosis not present

## 2017-04-14 DIAGNOSIS — M2548 Effusion, other site: Secondary | ICD-10-CM | POA: Diagnosis not present

## 2017-04-25 ENCOUNTER — Other Ambulatory Visit: Payer: Self-pay | Admitting: Family Medicine

## 2017-05-05 ENCOUNTER — Ambulatory Visit: Payer: Medicaid Other | Admitting: Family Medicine

## 2017-05-10 ENCOUNTER — Other Ambulatory Visit: Payer: Self-pay

## 2017-05-10 MED ORDER — GLUCOSE BLOOD VI STRP: ORAL_STRIP | 12 refills | Status: DC

## 2017-05-16 ENCOUNTER — Ambulatory Visit: Payer: Medicaid Other | Admitting: Family Medicine

## 2017-05-17 ENCOUNTER — Other Ambulatory Visit (INDEPENDENT_AMBULATORY_CARE_PROVIDER_SITE_OTHER): Payer: Self-pay | Admitting: Vascular Surgery

## 2017-05-24 ENCOUNTER — Encounter (INDEPENDENT_AMBULATORY_CARE_PROVIDER_SITE_OTHER): Payer: Medicaid Other

## 2017-05-25 ENCOUNTER — Other Ambulatory Visit: Payer: Self-pay

## 2017-05-25 DIAGNOSIS — F331 Major depressive disorder, recurrent, moderate: Secondary | ICD-10-CM

## 2017-05-25 DIAGNOSIS — F411 Generalized anxiety disorder: Secondary | ICD-10-CM

## 2017-05-25 MED ORDER — MELOXICAM 15 MG PO TABS: 15.0000 mg | ORAL_TABLET | Freq: Every day | ORAL | 0 refills | Status: DC

## 2017-05-25 MED ORDER — CITALOPRAM HYDROBROMIDE 40 MG PO TABS: 40.0000 mg | ORAL_TABLET | Freq: Every day | ORAL | 2 refills | Status: DC

## 2017-05-31 ENCOUNTER — Telehealth (INDEPENDENT_AMBULATORY_CARE_PROVIDER_SITE_OTHER): Payer: Self-pay
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
## 2017-06-22 ENCOUNTER — Other Ambulatory Visit: Payer: Self-pay

## 2017-06-22 MED ORDER — MELOXICAM 15 MG PO TABS: 15.0000 mg | ORAL_TABLET | Freq: Every day | ORAL | 5 refills | Status: DC

## 2017-06-23 DIAGNOSIS — N3946 Mixed incontinence: Secondary | ICD-10-CM | POA: Diagnosis not present

## 2017-06-23 DIAGNOSIS — L718 Other rosacea: Secondary | ICD-10-CM | POA: Diagnosis not present

## 2017-06-23 DIAGNOSIS — N393 Stress incontinence (female) (male): Secondary | ICD-10-CM | POA: Diagnosis not present

## 2017-06-23 DIAGNOSIS — L821 Other seborrheic keratosis: Secondary | ICD-10-CM | POA: Diagnosis not present

## 2017-06-23 DIAGNOSIS — B372 Candidiasis of skin and nail: Secondary | ICD-10-CM | POA: Diagnosis not present

## 2017-06-23 DIAGNOSIS — L02222 Furuncle of back [any part, except buttock]: Secondary | ICD-10-CM | POA: Diagnosis not present

## 2017-06-23 DIAGNOSIS — L918 Other hypertrophic disorders of the skin: Secondary | ICD-10-CM | POA: Diagnosis not present

## 2017-06-24 ENCOUNTER — Telehealth: Payer: Self-pay

## 2017-06-24 ENCOUNTER — Other Ambulatory Visit: Payer: Self-pay

## 2017-06-24 DIAGNOSIS — E119 Type 2 diabetes mellitus without complications: Secondary | ICD-10-CM

## 2017-06-24 MED ORDER — ACCU-CHEK NANO SMARTVIEW W/DEVICE KIT: 1.0000 | PACK | 0 refills | Status: DC
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
## 2017-06-24 NOTE — Telephone Encounter (Signed)
Rx send

## 2017-06-29 ENCOUNTER — Other Ambulatory Visit: Payer: Self-pay

## 2017-06-29 MED ORDER — GLUCOSE BLOOD VI STRP: ORAL_STRIP | 12 refills | Status: DC

## 2017-07-07 ENCOUNTER — Telehealth: Payer: Self-pay | Admitting: Family Medicine

## 2017-07-07 DIAGNOSIS — N6320 Unspecified lump in the left breast, unspecified quadrant: Secondary | ICD-10-CM

## 2017-07-07 NOTE — Telephone Encounter (Signed)
The pt needs a diagnostic mammogram placed.

## 2017-07-07 NOTE — Telephone Encounter (Signed)
Future order for Diagnostic Mammogram placed as she has had prior Left breast mass and biopsy in 02/2017.  Since patient is not in office, please confirm with [facility] that they have received order and will schedule this diagnostic mammogram.  Nobie Putnam, DO [facility] 07/07/2017, 5:57 PM

## 2017-07-07 NOTE — Telephone Encounter (Signed)
Pt needs an order for mammogram put in system.  Her call back number is [phone]

## 2017-07-08 NOTE — Telephone Encounter (Signed)
DC'd prior order for bilateral diagnostic. Changed to unilateral Left diagnostic and added L ultrasound.  Nobie Putnam, DO [facility] 07/08/2017, 12:25 PM

## 2017-07-08 NOTE — Telephone Encounter (Signed)
I contacted [facility] and was informed that we need to change the order. The pt needs a ([id number]) UNI Left 3D Mammogram & ([id number]) US of the Breast.

## 2017-07-08 NOTE — Addendum Note (Signed)
Addended by: [name] on: 07/08/2017 12:25 PM   Modules accepted: Orders

## 2017-07-18 ENCOUNTER — Other Ambulatory Visit: Payer: Self-pay

## 2017-07-18 DIAGNOSIS — E1165 Type 2 diabetes mellitus with hyperglycemia: Principal | ICD-10-CM

## 2017-07-18 DIAGNOSIS — E1140 Type 2 diabetes mellitus with diabetic neuropathy, unspecified: Secondary | ICD-10-CM

## 2017-07-18 DIAGNOSIS — IMO0002 Reserved for concepts with insufficient information to code with codable children: Secondary | ICD-10-CM

## 2017-07-18 MED ORDER — METFORMIN HCL 500 MG PO TABS: 500.0000 mg | ORAL_TABLET | Freq: Two times a day (BID) | ORAL | 5 refills | Status: DC

## 2017-07-22 ENCOUNTER — Other Ambulatory Visit (INDEPENDENT_AMBULATORY_CARE_PROVIDER_SITE_OTHER): Payer: Self-pay | Admitting: Vascular Surgery

## 2017-07-25 ENCOUNTER — Encounter (INDEPENDENT_AMBULATORY_CARE_PROVIDER_SITE_OTHER): Payer: Medicaid Other

## 2017-07-25 ENCOUNTER — Encounter (INDEPENDENT_AMBULATORY_CARE_PROVIDER_SITE_OTHER): Payer: Self-pay

## 2017-07-27 ENCOUNTER — Other Ambulatory Visit: Payer: Self-pay | Admitting: Family Medicine

## 2017-07-27 DIAGNOSIS — E1142 Type 2 diabetes mellitus with diabetic polyneuropathy: Secondary | ICD-10-CM

## 2017-07-28 DIAGNOSIS — L570 Actinic keratosis: Secondary | ICD-10-CM | POA: Diagnosis not present

## 2017-07-28 DIAGNOSIS — D239 Other benign neoplasm of skin, unspecified: Secondary | ICD-10-CM | POA: Diagnosis not present

## 2017-07-28 DIAGNOSIS — L720 Epidermal cyst: Secondary | ICD-10-CM | POA: Diagnosis not present

## 2017-07-28 DIAGNOSIS — L918 Other hypertrophic disorders of the skin: Secondary | ICD-10-CM | POA: Diagnosis not present

## 2017-07-29 ENCOUNTER — Other Ambulatory Visit: Payer: Self-pay | Admitting: Obstetrics and Gynecology

## 2017-07-29 ENCOUNTER — Encounter: Payer: Self-pay | Admitting: Obstetrics and Gynecology

## 2017-07-29 ENCOUNTER — Ambulatory Visit: Payer: Medicaid Other | Admitting: Obstetrics and Gynecology

## 2017-07-29 VITALS — BP 165/67 | HR 105 | Ht 63.5 in | Wt 225.4 lb

## 2017-07-29 DIAGNOSIS — N950 Postmenopausal bleeding: Secondary | ICD-10-CM | POA: Diagnosis not present

## 2017-07-29 DIAGNOSIS — N952 Postmenopausal atrophic vaginitis: Secondary | ICD-10-CM | POA: Diagnosis not present

## 2017-07-29 DIAGNOSIS — N3001 Acute cystitis with hematuria: Secondary | ICD-10-CM

## 2017-07-29 LAB — POCT URINALYSIS DIPSTICK
Bilirubin, UA: NEGATIVE
GLUCOSE UA: 2000
Ketones, UA: NEGATIVE
LEUKOCYTES UA: NEGATIVE
Nitrite, UA: POSITIVE
SPEC GRAV UA: 1.02 (ref 1.010–1.025)
Urobilinogen, UA: 0.2 E.U./dL
pH, UA: 5 (ref 5.0–8.0)

## 2017-07-29 MED ORDER — NITROFURANTOIN MONOHYD MACRO 100 MG PO CAPS: 100.0000 mg | ORAL_CAPSULE | Freq: Two times a day (BID) | ORAL | 1 refills | Status: DC

## 2017-07-29 NOTE — Progress Notes (Signed)
HPI:      [name] is a 57 y.o. [phone] who LMP was No LMP recorded. Patient is postmenopausal.  Subjective:   She presents today stating that she has had 2 days of bleeding.  She reports she has vaginal spotting.  She also describes pelvic pressure symptoms in the midline and difficulty voiding or voiding only small amounts.  States that she has never had a bladder infection. Patient also complains of erythema in the creases of her legs and under her belly with itching.  She has discussed this with a dermatologist and is currently being treated for a yeast infection. She is a diabetic and states that her sugars are not currently well controlled.    Hx: The following portions of the patient's history were reviewed and updated as appropriate:             She  has a past medical history of Anxiety, Asthma, GERD (gastroesophageal reflux disease), Hypertension, Hypothyroidism, Neuromuscular disorder ([place]), Shortness of breath dyspnea, and Sleep apnea. She does not have any pertinent problems on file. She  has a past surgical history that includes Tonsillectomy; Tubal ligation; Neck surgery; TUBALPLASTY; Leg Surgery (Right); Colonoscopy with propofol (N/A, 02/21/2015); polypectomy (02/21/2015); LEFT HEART CATH AND CORONARY ANGIOGRAPHY (N/A, 02/07/2017); and Breast biopsy (Left, 02/09/2017). Her family history includes Alzheimer's disease in her paternal grandmother; Cancer in her paternal grandmother; Diabetes in her father; Heart attack in her father and maternal grandfather; Heart disease in her father; Hypertension in her maternal grandfather, maternal grandmother, and mother; Stroke in her father; Thyroid disease in her mother. She  reports that she quit smoking about 3 years ago. Her smoking use included cigarettes. She has a 52.50 pack-year smoking history. she has never used smokeless tobacco. She reports that she does not drink alcohol or use drugs. She is allergic to clindamycin;  2,4-d dimethylamine (amisol); and nyquil multi-symptom [pseudoeph-doxylamine-dm-apap].       Review of Systems:  Review of Systems  Constitutional: Denied constitutional symptoms, night sweats, recent illness, fatigue, fever, insomnia and weight loss.  Eyes: Denied eye symptoms, eye pain, photophobia, vision change and visual disturbance.  Ears/Nose/Throat/Neck: Denied ear, nose, throat or neck symptoms, hearing loss, nasal discharge, sinus congestion and sore throat.  Cardiovascular: Denied cardiovascular symptoms, arrhythmia, chest pain/pressure, edema, exercise intolerance, orthopnea and palpitations.  Respiratory: Denied pulmonary symptoms, asthma, pleuritic pain, productive sputum, cough, dyspnea and wheezing.  Gastrointestinal: Denied, gastro-esophageal reflux, melena, nausea and vomiting.  Genitourinary: See HPI for additional information.  Musculoskeletal: Denied musculoskeletal symptoms, stiffness, swelling, muscle weakness and myalgia.  Dermatologic: Denied dermatology symptoms, rash and scar.  Neurologic: Denied neurology symptoms, dizziness, headache, neck pain and syncope.  Psychiatric: Denied psychiatric symptoms, anxiety and depression.  Endocrine: Denied endocrine symptoms including hot flashes and night sweats.   Meds:   Current Outpatient Medications on File Prior to Visit  Medication Sig Dispense Refill  . ACCU-CHEK FASTCLIX LANCETS MISC TEST once daily 100 each 12  . albuterol (PROAIR HFA) 108 (90 Base) MCG/ACT inhaler Inhale 2 puffs into the lungs every 6 (six) hours as needed for wheezing or shortness of breath. 1 Inhaler 11  . amitriptyline (ELAVIL) 25 MG tablet Take 0.5 tablets (12.5 mg total) by mouth at bedtime. 15 tablet 6  . aspirin EC 81 MG tablet Take 81 mg by mouth at bedtime.    . Aspirin-Salicylamide-Caffeine (BC HEADACHE PO) Take 2 packets by mouth daily as needed (pain).    . baclofen (LIORESAL) 10 MG  tablet Take 1 tabs twice daily and 2 additional tabs  at bedtime. 180 each 6  . benzonatate (TESSALON) 100 MG capsule Take by mouth as needed for cough.    . Blood Glucose Monitoring Suppl (ACCU-CHEK NANO SMARTVIEW) w/Device KIT 1 each by Does not apply route as directed. 1 kit 0  . budesonide-formoterol (SYMBICORT) 160-4.5 MCG/ACT inhaler Inhale 2 puffs into the lungs 2 (two) times daily. 1 Inhaler 12  . Calcium Carbonate-Vit D-Min (CALCIUM 600+D PLUS MINERALS PO) Take 1 tablet by mouth daily.     . chlorhexidine (PERIDEX) 0.12 % solution 5 mLs by Mouth Rinse route daily as needed (mouth sores).   0  . Cholecalciferol (VITAMIN D3) 1000 units CAPS Take by mouth daily.    . citalopram (CELEXA) 40 MG tablet Take 1 tablet (40 mg total) daily by mouth. 30 tablet 2  . esomeprazole (NEXIUM) 40 MG capsule Take 1 capsule (40 mg total) by mouth daily before breakfast. 90 capsule 3  . ferrous sulfate 325 (65 FE) MG tablet Take 325 mg by mouth daily.     . fluocinonide (LIDEX) 0.05 % external solution Apply 1 application topically daily as needed for irritation.  0  . fluoruracil (CARAC) 0.5 % cream Apply 0.5 mg topically daily as needed (skin spots).   0  . furosemide (LASIX) 20 MG tablet Alternate taking 1, 20 mg Lasix with 2, 20 mg tabs, every other day 45 tablet 6  . gabapentin (NEURONTIN) 400 MG capsule Take 1 capsule (400 mg total) by mouth 3 (three) times daily. (Patient taking differently: Take 400 mg by mouth 2 (two) times daily. May take an additional 400 mg as needed for pain) 270 capsule 3  . glucose blood (ACCU-CHEK SMARTVIEW) test strip TEST 4 TIMES DAILY BEFORE MEALS AND BEFORE BEDTIME. NEEDS FOLLOW UP APPOINTMENT 100 each 12  . glucose blood test strip Use as instructed 100 each 12  . ipratropium-albuterol (DUONEB) 0.5-2.5 (3) MG/3ML SOLN Take 3 mLs by nebulization every 2 (two) hours as needed. (Patient taking differently: Take 3 mLs by nebulization every 2 (two) hours as needed (shortness of breath). ) 360 mL 12  . ketoconazole (NIZORAL) 2 %  shampoo Apply 1 application topically 3 (three) times a week.   0  . levothyroxine (SYNTHROID, LEVOTHROID) 175 MCG tablet Take 1 tablet (175 mcg total) by mouth daily. (Patient taking differently: Take 175 mcg by mouth daily before breakfast. ) 90 tablet 3  . meloxicam (MOBIC) 15 MG tablet Take 1 tablet (15 mg total) by mouth daily. 30 tablet 5  . metFORMIN (GLUCOPHAGE) 500 MG tablet Take 1 tablet (500 mg total) by mouth 2 (two) times daily with a meal. 60 tablet 5  . Milk Thistle 500 MG CAPS Take 500 mg by mouth 2 (two) times daily.     . Multiple Vitamin (MULTIVITAMIN) tablet Take 1 tablet by mouth daily.    . Probiotic Product (PRO-BIOTIC BLEND PO) Take 1 capsule by mouth daily.    Marland Kitchen SPIRIVA HANDIHALER 18 MCG inhalation capsule inhale the contents of one capsule in the handihaler once daily IN THE MORNING 30 capsule 12   No current facility-administered medications on file prior to visit.     Objective:     Vitals:   07/29/17 0855  BP: (!) 165/67  Pulse: (!) 105              Physical examination   Pelvic:   Vulva: Normal appearance.  No lesions.  Vagina: No  lesions or abnormalities noted.  Atrophic vaginitis  Support: Normal pelvic support.  Cystocele and rectocele noted  Urethra No masses tenderness or scarring.  Meatus Normal size without lesions or prolapse.  Cervix: Normal appearance.  No lesions.  Anus: Normal exam.  No lesions.  Perineum: Normal exam.  No lesions.        Bimanual   Uterus: Normal size.  Non-tender.  Mobile.  AV.  Adnexae: No masses.  Non-tender to palpation.  Cul-de-sac: Negative for abnormality.   UA performed -likely consistent with UTI  - hematuria noted  Assessment:    [id number] Patient Active Problem List   Diagnosis Date Noted  . Varicose veins of leg with swelling, left 03/04/2017  . Swelling of limb 02/08/2017  . Chronic neck pain 01/24/2017  . Osteoarthritis of multiple joints 10/12/2016  . Chronic pain of left lower extremity 10/12/2016   . Major depression, recurrent ([place]) 10/01/2016  . GAD (generalized anxiety disorder) 10/01/2016  . Postmenopausal syndrome 10/01/2016  . Family history of vitamin B12 deficiency 10/01/2016  . Decreased energy 10/01/2016  . Facial rash 10/01/2016  . Seborrheic dermatitis of scalp 10/01/2016  . Elevated LFTs 10/01/2016  . Atypical chest pain 06/29/2016  . 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 Pruritic disorder 06/16/2016  . 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 Allergic contact dermatitis 02/09/2016  . Paroxysmal dyspnea 02/09/2016  . RLS (restless legs syndrome) 02/09/2016  . Allergic rhinitis, seasonal 02/09/2016  . Tobacco abuse 02/09/2016  . Eczema 05/28/2015  . Neuropathy 05/28/2015  . Essential hypertension 05/28/2015  . COPD (chronic obstructive pulmonary disease) ([place]) 05/28/2015  . Hypothyroidism 05/28/2015  . GERD (gastroesophageal reflux disease) 05/28/2015  . Chronic fatigue 05/28/2015  . Hx of colonic polyps   . Benign neoplasm of sigmoid colon   . Incomplete emptying of bladder 11/11/2014  . Mixed incontinence urge and stress 11/11/2014  . Rectocele 11/11/2014  . Secondary localized osteoarthrosis of ankle and foot 08/19/2014  . Ulnar neuropathy of left upper extremity 08/16/2014  . Impingement syndrome of both shoulders 08/05/2014  . Fixation hardware in lower extremity 04/23/2014  . Bilateral edema of lower extremity 04/22/2014  . Mild persistent asthma 04/22/2014  . Obesity (BMI 35.0-39.9 without comorbidity) 04/22/2014  . Former smoker 08/13/2013  . Cubital tunnel syndrome 08/20/2011  . Incontinence of urine 06/11/2011  . Prolapsed, uterovaginal, incomplete 06/08/2011     1. Postmenopausal bleeding   2. Vaginal atrophy   3. Acute cystitis with hematuria     Likely her cystitis is causing her pressure symptoms and possibly even her vaginal spotting.  She also has vaginal atrophy which may be the source of her spotting.  Doubt  endometrial issue. Patient correctly diagnosed with cutaneous monilia and is being treated appropriately by her dermatologist. Strongly emphasized diabetic control.   Plan:            1.  We will treat with Macrobid for UTI  2.  Ultrasound for endometrial thickness  3.  Continue treatment as prescribed for cutaneous monilia  4.  Diabetic control of sugars emphasized Orders No orders of the defined types were placed in this encounter.    Meds ordered this encounter  Medications  . nitrofurantoin, macrocrystal-monohydrate, (MACROBID) 100 MG capsule    Sig: Take 1 capsule (100 mg total) by mouth 2 (two) times daily.    Dispense:  14 capsule    Refill:  1      F/U  Return in about 2 weeks (  around 08/12/2017).  Finis Bud, M.D. 07/29/2017 10:03 AM

## 2017-07-29 NOTE — Addendum Note (Signed)
Addended by: Raliegh Ip on: 07/29/2017 10:32 AM   Modules accepted: Orders

## 2017-08-01 LAB — URINE CULTURE

## 2017-08-05 ENCOUNTER — Ambulatory Visit (INDEPENDENT_AMBULATORY_CARE_PROVIDER_SITE_OTHER): Payer: Medicare Other

## 2017-08-05 DIAGNOSIS — N950 Postmenopausal bleeding: Secondary | ICD-10-CM

## 2017-08-12 ENCOUNTER — Encounter: Payer: Self-pay | Admitting: Obstetrics and Gynecology

## 2017-08-12 ENCOUNTER — Ambulatory Visit (INDEPENDENT_AMBULATORY_CARE_PROVIDER_SITE_OTHER): Payer: Medicare Other | Admitting: Obstetrics and Gynecology

## 2017-08-12 VITALS — BP 149/77 | HR 83 | Ht 63.5 in | Wt 224.1 lb

## 2017-08-12 DIAGNOSIS — N950 Postmenopausal bleeding: Secondary | ICD-10-CM | POA: Diagnosis not present

## 2017-08-12 NOTE — Progress Notes (Signed)
HPI:      [name] is a 57 y.o. [phone] who LMP was No LMP recorded. Patient is postmenopausal.  Subjective:   She presents today for follow-up of her ultrasound.  She was experiencing postmenopausal bleeding.  She was subsequently found to have a urinary tract infection and has been taking Macrobid.  She reports all of her symptoms have resolved and she has had no further bleeding.    Hx: The following portions of the patient's history were reviewed and updated as appropriate:             She  has a past medical history of Anxiety, Asthma, GERD (gastroesophageal reflux disease), Hypertension, Hypothyroidism, Neuromuscular disorder ([place]), Shortness of breath dyspnea, and Sleep apnea. She does not have any pertinent problems on file. She  has a past surgical history that includes Tonsillectomy; Tubal ligation; Neck surgery; TUBALPLASTY; Leg Surgery (Right); Colonoscopy with propofol (N/A, 02/21/2015); polypectomy (02/21/2015); LEFT HEART CATH AND CORONARY ANGIOGRAPHY (N/A, 02/07/2017); and Breast biopsy (Left, 02/09/2017). Her family history includes Alzheimer's disease in her paternal grandmother; Cancer in her paternal grandmother; Diabetes in her father; Heart attack in her father and maternal grandfather; Heart disease in her father; Hypertension in her maternal grandfather, maternal grandmother, and mother; Stroke in her father; Thyroid disease in her mother. She  reports that she quit smoking about 3 years ago. Her smoking use included cigarettes. She has a 52.50 pack-year smoking history. she has never used smokeless tobacco. She reports that she does not drink alcohol or use drugs. She is allergic to clindamycin; 2,4-d dimethylamine (amisol); and nyquil multi-symptom [pseudoeph-doxylamine-dm-apap].       Review of Systems:  Review of Systems  Constitutional: Denied constitutional symptoms, night sweats, recent illness, fatigue, fever, insomnia and weight loss.  Eyes: Denied  eye symptoms, eye pain, photophobia, vision change and visual disturbance.  Ears/Nose/Throat/Neck: Denied ear, nose, throat or neck symptoms, hearing loss, nasal discharge, sinus congestion and sore throat.  Cardiovascular: Denied cardiovascular symptoms, arrhythmia, chest pain/pressure, edema, exercise intolerance, orthopnea and palpitations.  Respiratory: Denied pulmonary symptoms, asthma, pleuritic pain, productive sputum, cough, dyspnea and wheezing.  Gastrointestinal: Denied, gastro-esophageal reflux, melena, nausea and vomiting.  Genitourinary: Denied genitourinary symptoms including symptomatic vaginal discharge, pelvic relaxation issues, and urinary complaints.  Musculoskeletal: Denied musculoskeletal symptoms, stiffness, swelling, muscle weakness and myalgia.  Dermatologic: Denied dermatology symptoms, rash and scar.  Neurologic: Denied neurology symptoms, dizziness, headache, neck pain and syncope.  Psychiatric: Denied psychiatric symptoms, anxiety and depression.  Endocrine: Denied endocrine symptoms including hot flashes and night sweats.   Meds:   Current Outpatient Medications on File Prior to Visit  Medication Sig Dispense Refill  . ACCU-CHEK FASTCLIX LANCETS MISC TEST once daily 100 each 12  . albuterol (PROAIR HFA) 108 (90 Base) MCG/ACT inhaler Inhale 2 puffs into the lungs every 6 (six) hours as needed for wheezing or shortness of breath. 1 Inhaler 11  . amitriptyline (ELAVIL) 25 MG tablet Take 0.5 tablets (12.5 mg total) by mouth at bedtime. 15 tablet 6  . aspirin EC 81 MG tablet Take 81 mg by mouth at bedtime.    . Aspirin-Salicylamide-Caffeine (BC HEADACHE PO) Take 2 packets by mouth daily as needed (pain).    . baclofen (LIORESAL) 10 MG tablet Take 1 tabs twice daily and 2 additional tabs at bedtime. 180 each 6  . benzonatate (TESSALON) 100 MG capsule Take by mouth as needed for cough.    . Black Pepper-Turmeric (TURMERIC COMPLEX/BLACK PEPPER) 3-500 MG CAPS  Take by mouth.     . Blood Glucose Monitoring Suppl (ACCU-CHEK NANO SMARTVIEW) w/Device KIT 1 each by Does not apply route as directed. 1 kit 0  . budesonide-formoterol (SYMBICORT) 160-4.5 MCG/ACT inhaler Inhale 2 puffs into the lungs 2 (two) times daily. 1 Inhaler 12  . Calcium Carbonate-Vit D-Min (CALCIUM 600+D PLUS MINERALS PO) Take 1 tablet by mouth daily.     . chlorhexidine (PERIDEX) 0.12 % solution 5 mLs by Mouth Rinse route daily as needed (mouth sores).   0  . citalopram (CELEXA) 40 MG tablet Take 1 tablet (40 mg total) daily by mouth. 30 tablet 2  . esomeprazole (NEXIUM) 40 MG capsule Take 1 capsule (40 mg total) by mouth daily before breakfast. 90 capsule 3  . ferrous sulfate 325 (65 FE) MG tablet Take 325 mg by mouth daily.     . fluocinonide (LIDEX) 0.05 % external solution Apply 1 application topically daily as needed for irritation.  0  . fluoruracil (CARAC) 0.5 % cream Apply 0.5 mg topically daily as needed (skin spots).   0  . furosemide (LASIX) 20 MG tablet Alternate taking 1, 20 mg Lasix with 2, 20 mg tabs, every other day 45 tablet 6  . gabapentin (NEURONTIN) 400 MG capsule Take 1 capsule (400 mg total) by mouth 3 (three) times daily. (Patient taking differently: Take 400 mg by mouth 2 (two) times daily. May take an additional 400 mg as needed for pain) 270 capsule 3  . glucose blood (ACCU-CHEK SMARTVIEW) test strip TEST 4 TIMES DAILY BEFORE MEALS AND BEFORE BEDTIME. NEEDS FOLLOW UP APPOINTMENT 100 each 12  . glucose blood test strip Use as instructed 100 each 12  . ipratropium-albuterol (DUONEB) 0.5-2.5 (3) MG/3ML SOLN Take 3 mLs by nebulization every 2 (two) hours as needed. (Patient taking differently: Take 3 mLs by nebulization every 2 (two) hours as needed (shortness of breath). ) 360 mL 12  . ketoconazole (NIZORAL) 2 % shampoo Apply 1 application topically 3 (three) times a week.   0  . levothyroxine (SYNTHROID, LEVOTHROID) 175 MCG tablet Take 1 tablet (175 mcg total) by mouth daily.  (Patient taking differently: Take 175 mcg by mouth daily before breakfast. ) 90 tablet 3  . meloxicam (MOBIC) 15 MG tablet Take 1 tablet (15 mg total) by mouth daily. 30 tablet 5  . metFORMIN (GLUCOPHAGE) 500 MG tablet Take 1 tablet (500 mg total) by mouth 2 (two) times daily with a meal. 60 tablet 5  . Milk Thistle 500 MG CAPS Take 500 mg by mouth 2 (two) times daily.     . Multiple Vitamin (MULTIVITAMIN) tablet Take 1 tablet by mouth daily.    . Probiotic Product (PRO-BIOTIC BLEND PO) Take 1 capsule by mouth daily.    Marland Kitchen SPIRIVA HANDIHALER 18 MCG inhalation capsule inhale the contents of one capsule in the handihaler once daily IN THE MORNING 30 capsule 12   No current facility-administered medications on file prior to visit.     Objective:     Vitals:   08/12/17 0936  BP: (!) 149/77  Pulse: 83              Ultrasound results reviewed directly with the patient endometrial thickness discussed.  Assessment:    [id number] Patient Active Problem List   Diagnosis Date Noted  . Varicose veins of leg with swelling, left 03/04/2017  . Swelling of limb 02/08/2017  . Chronic neck pain 01/24/2017  . Osteoarthritis of multiple joints 10/12/2016  . Chronic  pain of left lower extremity 10/12/2016  . Major depression, recurrent ([place]) 10/01/2016  . GAD (generalized anxiety disorder) 10/01/2016  . Postmenopausal syndrome 10/01/2016  . Family history of vitamin B12 deficiency 10/01/2016  . Decreased energy 10/01/2016  . Facial rash 10/01/2016  . Seborrheic dermatitis of scalp 10/01/2016  . Elevated LFTs 10/01/2016  . Atypical chest pain 06/29/2016  . 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 Pruritic disorder 06/16/2016  . 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 Allergic contact dermatitis 02/09/2016  . Paroxysmal dyspnea 02/09/2016  . RLS (restless legs syndrome) 02/09/2016  . Allergic rhinitis, seasonal 02/09/2016  . Tobacco abuse 02/09/2016  . Eczema 05/28/2015   . Neuropathy 05/28/2015  . Essential hypertension 05/28/2015  . COPD (chronic obstructive pulmonary disease) ([place]) 05/28/2015  . Hypothyroidism 05/28/2015  . GERD (gastroesophageal reflux disease) 05/28/2015  . Chronic fatigue 05/28/2015  . Hx of colonic polyps   . Benign neoplasm of sigmoid colon   . Incomplete emptying of bladder 11/11/2014  . Mixed incontinence urge and stress 11/11/2014  . Rectocele 11/11/2014  . Secondary localized osteoarthrosis of ankle and foot 08/19/2014  . Ulnar neuropathy of left upper extremity 08/16/2014  . Impingement syndrome of both shoulders 08/05/2014  . Fixation hardware in lower extremity 04/23/2014  . Bilateral edema of lower extremity 04/22/2014  . Mild persistent asthma 04/22/2014  . Obesity (BMI 35.0-39.9 without comorbidity) 04/22/2014  . Former smoker 08/13/2013  . Cubital tunnel syndrome 08/20/2011  . Incontinence of urine 06/11/2011  . Prolapsed, uterovaginal, incomplete 06/08/2011     1. Postmenopausal bleeding     Resolved -likely from bladder/UTI.   Plan:            1.  Discussed in detail with patient.  Patient is reassured. Orders No orders of the defined types were placed in this encounter.   No orders of the defined types were placed in this encounter.     F/U  Return for Annual Physical. I spent 12 minutes with this patient of which greater than 50% was spent discussing postmenopausal bleeding, UTI, ultrasound findings.  Finis Bud, M.D. 08/12/2017 10:06 AM

## 2017-08-16 ENCOUNTER — Ambulatory Visit
Admission: RE | Admit: 2017-08-16 | Discharge: 2017-08-16 | Disposition: A | Discharge status: Home / Self Care | Payer: Medicare Other | Source: Ambulatory Visit | Attending: Family Medicine | Admitting: Family Medicine

## 2017-08-16 DIAGNOSIS — R928 Other abnormal and inconclusive findings on diagnostic imaging of breast: Secondary | ICD-10-CM | POA: Insufficient documentation

## 2017-08-16 DIAGNOSIS — N6320 Unspecified lump in the left breast, unspecified quadrant: Secondary | ICD-10-CM

## 2017-08-16 DIAGNOSIS — Z9889 Other specified postprocedural states: Secondary | ICD-10-CM | POA: Diagnosis not present

## 2017-08-19 ENCOUNTER — Other Ambulatory Visit: Payer: Self-pay

## 2017-08-19 DIAGNOSIS — F411 Generalized anxiety disorder: Secondary | ICD-10-CM

## 2017-08-19 DIAGNOSIS — F331 Major depressive disorder, recurrent, moderate: Secondary | ICD-10-CM

## 2017-08-19 MED ORDER — CITALOPRAM HYDROBROMIDE 40 MG PO TABS: 40.0000 mg | ORAL_TABLET | Freq: Every day | ORAL | 2 refills | Status: DC

## 2017-08-22 DIAGNOSIS — J449 Chronic obstructive pulmonary disease, unspecified: Secondary | ICD-10-CM | POA: Diagnosis not present

## 2017-08-22 DIAGNOSIS — G479 Sleep disorder, unspecified: Secondary | ICD-10-CM | POA: Diagnosis not present

## 2017-08-22 DIAGNOSIS — E669 Obesity, unspecified: Secondary | ICD-10-CM | POA: Diagnosis not present

## 2017-08-22 DIAGNOSIS — Z6841 Body Mass Index (BMI) 40.0 and over, adult: Secondary | ICD-10-CM | POA: Diagnosis not present

## 2017-09-05 ENCOUNTER — Ambulatory Visit: Payer: Medicare Other | Admitting: Family Medicine

## 2017-09-06 ENCOUNTER — Ambulatory Visit (INDEPENDENT_AMBULATORY_CARE_PROVIDER_SITE_OTHER): Payer: Medicare Other | Admitting: Family Medicine

## 2017-09-06 ENCOUNTER — Encounter: Payer: Self-pay | Admitting: Family Medicine

## 2017-09-06 VITALS — BP 140/80 | HR 78 | Temp 98.6°F | Resp 16 | Ht 63.5 in

## 2017-09-06 DIAGNOSIS — E559 Vitamin D deficiency, unspecified: Secondary | ICD-10-CM | POA: Diagnosis not present

## 2017-09-06 DIAGNOSIS — F331 Major depressive disorder, recurrent, moderate: Secondary | ICD-10-CM | POA: Diagnosis not present

## 2017-09-06 DIAGNOSIS — I10 Essential (primary) hypertension: Secondary | ICD-10-CM

## 2017-09-06 DIAGNOSIS — Z8349 Family history of other endocrine, nutritional and metabolic diseases: Secondary | ICD-10-CM

## 2017-09-06 DIAGNOSIS — E669 Obesity, unspecified: Secondary | ICD-10-CM

## 2017-09-06 DIAGNOSIS — R748 Abnormal levels of other serum enzymes: Secondary | ICD-10-CM

## 2017-09-06 DIAGNOSIS — R5382 Chronic fatigue, unspecified: Secondary | ICD-10-CM

## 2017-09-06 DIAGNOSIS — R7989 Other specified abnormal findings of blood chemistry: Secondary | ICD-10-CM

## 2017-09-06 DIAGNOSIS — E785 Hyperlipidemia, unspecified: Secondary | ICD-10-CM

## 2017-09-06 DIAGNOSIS — E1142 Type 2 diabetes mellitus with diabetic polyneuropathy: Secondary | ICD-10-CM | POA: Diagnosis not present

## 2017-09-06 DIAGNOSIS — E034 Atrophy of thyroid (acquired): Secondary | ICD-10-CM

## 2017-09-06 DIAGNOSIS — R945 Abnormal results of liver function studies: Secondary | ICD-10-CM | POA: Diagnosis not present
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
## 2017-09-06 NOTE — Patient Instructions (Addendum)
Thank you for coming to the office today.   1.  Call [employer] to check with benefits to see which Glucometer testing kit is covered and let us know which one to prescribe  2.  Notify us with your plan with which pharmacy to send local meds to here in [place] - we can send 90 day supply here, and refills for you to pick up or have shipped to you in future  I cannot send long-term prescriptions out of state, only short term 30 day if needed  We can send Diflucan to pharmacy to use as needed if for future yeast infection  3. DUE for FASTING BLOOD WORK (no food or drink after midnight before the lab appointment, only water or coffee without cream/sugar on the morning of)   SCHEDULE "Lab Only" visit in the morning at the clinic for lab draw within 1 week  - Make sure Lab Only appointment is at about 1 week before your next appointment, so that results will be available  For Lab Results, once available within 2-3 days of blood draw, you can can log in to MyChart online to view your results and a brief explanation. Also, we can discuss results at next follow-up visit.   Please schedule a Follow-up Appointment to: Return if symptoms worsen or fail to improve.    If you have any other questions or concerns, please feel free to call the office or send a message through [place]. You may also schedule an earlier appointment if necessary.  Additionally, you may be receiving a survey about your experience at our office within a few days to 1 week by e-mail or mail. We value your feedback.  Nobie Putnam, DO [place]

## 2017-09-12 ENCOUNTER — Other Ambulatory Visit: Payer: Self-pay | Admitting: Family Medicine

## 2017-09-12 DIAGNOSIS — E1142 Type 2 diabetes mellitus with diabetic polyneuropathy: Secondary | ICD-10-CM

## 2017-09-12 DIAGNOSIS — E785 Hyperlipidemia, unspecified: Secondary | ICD-10-CM

## 2017-09-12 DIAGNOSIS — F331 Major depressive disorder, recurrent, moderate: Secondary | ICD-10-CM | POA: Diagnosis not present

## 2017-09-12 DIAGNOSIS — R7989 Other specified abnormal findings of blood chemistry: Secondary | ICD-10-CM

## 2017-09-12 DIAGNOSIS — I10 Essential (primary) hypertension: Secondary | ICD-10-CM | POA: Diagnosis not present

## 2017-09-12 DIAGNOSIS — R5382 Chronic fatigue, unspecified: Secondary | ICD-10-CM

## 2017-09-12 DIAGNOSIS — E669 Obesity, unspecified: Secondary | ICD-10-CM

## 2017-09-12 DIAGNOSIS — E034 Atrophy of thyroid (acquired): Secondary | ICD-10-CM | POA: Diagnosis not present

## 2017-09-12 DIAGNOSIS — R945 Abnormal results of liver function studies: Secondary | ICD-10-CM

## 2017-09-12 DIAGNOSIS — E559 Vitamin D deficiency, unspecified: Secondary | ICD-10-CM

## 2017-09-12 DIAGNOSIS — Z8349 Family history of other endocrine, nutritional and metabolic diseases: Secondary | ICD-10-CM

## 2017-09-12 DIAGNOSIS — R748 Abnormal levels of other serum enzymes: Secondary | ICD-10-CM | POA: Diagnosis not present

## 2017-09-13 ENCOUNTER — Other Ambulatory Visit: Payer: Self-pay | Admitting: Family Medicine

## 2017-09-13 DIAGNOSIS — F331 Major depressive disorder, recurrent, moderate: Secondary | ICD-10-CM

## 2017-09-13 DIAGNOSIS — F418 Other specified anxiety disorders: Secondary | ICD-10-CM

## 2017-09-13 DIAGNOSIS — F411 Generalized anxiety disorder: Secondary | ICD-10-CM

## 2017-09-13 DIAGNOSIS — E1165 Type 2 diabetes mellitus with hyperglycemia: Secondary | ICD-10-CM

## 2017-09-13 DIAGNOSIS — E1140 Type 2 diabetes mellitus with diabetic neuropathy, unspecified: Secondary | ICD-10-CM

## 2017-09-13 DIAGNOSIS — E034 Atrophy of thyroid (acquired): Secondary | ICD-10-CM

## 2017-09-13 DIAGNOSIS — J431 Panlobular emphysema: Secondary | ICD-10-CM

## 2017-09-13 DIAGNOSIS — M79605 Pain in left leg: Principal | ICD-10-CM

## 2017-09-13 DIAGNOSIS — K219 Gastro-esophageal reflux disease without esophagitis: Secondary | ICD-10-CM

## 2017-09-13 DIAGNOSIS — G629 Polyneuropathy, unspecified: Secondary | ICD-10-CM

## 2017-09-13 DIAGNOSIS — G8929 Other chronic pain: Secondary | ICD-10-CM

## 2017-09-13 DIAGNOSIS — IMO0002 Reserved for concepts with insufficient information to code with codable children: Secondary | ICD-10-CM

## 2017-09-13 LAB — COMPLETE METABOLIC PANEL WITH GFR
AG RATIO: 1.4 (calc) (ref 1.0–2.5)
ALT: 35 U/L — AB (ref 6–29)
AST: 37 U/L — AB (ref 10–35)
Albumin: 4.2 g/dL (ref 3.6–5.1)
Alkaline phosphatase (APISO): 101 U/L (ref 33–130)
BILIRUBIN TOTAL: 0.7 mg/dL (ref 0.2–1.2)
BUN: 12 mg/dL (ref 7–25)
CALCIUM: 9.6 mg/dL (ref 8.6–10.4)
CHLORIDE: 106 mmol/L (ref 98–110)
CO2: 29 mmol/L (ref 20–32)
Creat: 0.72 mg/dL (ref 0.50–1.05)
GFR, EST AFRICAN AMERICAN: 108 mL/min/{1.73_m2} (ref >=60)
GFR, EST NON AFRICAN AMERICAN: 94 mL/min/{1.73_m2} (ref >=60)
GLOBULIN: 3.1 g/dL (ref 1.9–3.7)
Glucose, Bld: 150 mg/dL — ABNORMAL HIGH (ref 65–99)
POTASSIUM: 4.7 mmol/L (ref 3.5–5.3)
SODIUM: 143 mmol/L (ref 135–146)
Total Protein: 7.3 g/dL (ref 6.1–8.1)

## 2017-09-13 LAB — HEMOGLOBIN A1C
Hgb A1c MFr Bld: 7.4 % of total Hgb — ABNORMAL HIGH (ref <5.7)
Mean Plasma Glucose: 166 (calc)
eAG (mmol/L): 9.2 (calc)

## 2017-09-13 LAB — LIPID PANEL
CHOL/HDL RATIO: 3.8 (calc) (ref <5.0)
CHOLESTEROL: 184 mg/dL (ref <200)
HDL: 48 mg/dL — ABNORMAL LOW (ref >50)
LDL Cholesterol (Calc): 106 mg/dL (calc) — ABNORMAL HIGH
NON-HDL CHOLESTEROL (CALC): 136 mg/dL — AB (ref <130)
Triglycerides: 189 mg/dL — ABNORMAL HIGH (ref <150)

## 2017-09-13 LAB — CBC WITH DIFFERENTIAL/PLATELET
BASOS PCT: 0.7 %
Basophils Absolute: 53 cells/uL (ref 0–200)
EOS ABS: 450 {cells}/uL (ref 15–500)
EOS PCT: 6 %
HCT: 38.6 % (ref 35.0–45.0)
HEMOGLOBIN: 13 g/dL (ref 11.7–15.5)
Lymphs Abs: 1800 cells/uL (ref 850–3900)
MCH: 28.3 pg (ref 27.0–33.0)
MCHC: 33.7 g/dL (ref 32.0–36.0)
MCV: 84.1 fL (ref 80.0–100.0)
MONOS PCT: 6.2 %
MPV: 12.3 fL (ref 7.5–12.5)
NEUTROS ABS: 4733 {cells}/uL (ref 1500–7800)
Neutrophils Relative %: 63.1 %
Platelets: 141 10*3/uL (ref 140–400)
RBC: 4.59 10*6/uL (ref 3.80–5.10)
RDW: 14.6 % (ref 11.0–15.0)
Total Lymphocyte: 24 %
WBC mixed population: 465 cells/uL (ref 200–950)
WBC: 7.5 10*3/uL (ref 3.8–10.8)

## 2017-09-13 LAB — VITAMIN B12: VITAMIN B 12: 1788 pg/mL — AB (ref 200–1100)

## 2017-09-13 LAB — VITAMIN D 25 HYDROXY (VIT D DEFICIENCY, FRACTURES): VIT D 25 HYDROXY: 38 ng/mL (ref 30–100)

## 2017-09-13 LAB — T4, FREE: FREE T4: 1.3 ng/dL (ref 0.8–1.8)

## 2017-09-13 LAB — TSH: TSH: 2.37 mIU/L (ref 0.40–4.50)

## 2017-09-13 NOTE — Telephone Encounter (Signed)
Please call patient to request local [place] pharmacy to send her medicines, I will send 90 day supply with 1 refill for now.  We do not have local pharmacy on chart. She has a Product/process development scientist in [place]. But as I have discussed with her I do not plan to send rx out of state to [facility].  Nobie Putnam, [facility] 09/13/2017, 5:51 PM

## 2017-09-14 MED ORDER — AMITRIPTYLINE HCL 25 MG PO TABS: 12.5000 mg | ORAL_TABLET | Freq: Every day | ORAL | 1 refills | Status: DC

## 2017-09-14 MED ORDER — BACLOFEN 10 MG PO TABS: ORAL_TABLET | ORAL | 1 refills | Status: DC

## 2017-09-14 MED ORDER — ALBUTEROL SULFATE HFA 108 (90 BASE) MCG/ACT IN AERS: 2.0000 | INHALATION_SPRAY | Freq: Four times a day (QID) | RESPIRATORY_TRACT | 5 refills | Status: DC | PRN

## 2017-09-14 MED ORDER — GABAPENTIN 400 MG PO CAPS: 400.0000 mg | ORAL_CAPSULE | Freq: Three times a day (TID) | ORAL | 1 refills | Status: DC

## 2017-09-14 MED ORDER — MELOXICAM 15 MG PO TABS: 15.0000 mg | ORAL_TABLET | Freq: Every day | ORAL | 1 refills | Status: DC

## 2017-09-14 MED ORDER — METFORMIN HCL 500 MG PO TABS: 500.0000 mg | ORAL_TABLET | Freq: Two times a day (BID) | ORAL | 1 refills | Status: DC

## 2017-09-14 MED ORDER — CITALOPRAM HYDROBROMIDE 40 MG PO TABS: 40.0000 mg | ORAL_TABLET | Freq: Every day | ORAL | 1 refills | Status: DC

## 2017-09-14 MED ORDER — TIOTROPIUM BROMIDE MONOHYDRATE 18 MCG IN CAPS: ORAL_CAPSULE | RESPIRATORY_TRACT | 1 refills | Status: DC

## 2017-09-14 MED ORDER — ESOMEPRAZOLE MAGNESIUM 40 MG PO CPDR: 40.0000 mg | DELAYED_RELEASE_CAPSULE | Freq: Every day | ORAL | 1 refills | Status: DC

## 2017-09-14 MED ORDER — LEVOTHYROXINE SODIUM 175 MCG PO TABS: 175.0000 ug | ORAL_TABLET | Freq: Every day | ORAL | 1 refills | Status: DC

## 2017-09-14 MED ORDER — BUDESONIDE-FORMOTEROL FUMARATE 160-4.5 MCG/ACT IN AERO: 2.0000 | INHALATION_SPRAY | Freq: Two times a day (BID) | RESPIRATORY_TRACT | 1 refills | Status: DC

## 2017-09-14 NOTE — Telephone Encounter (Signed)
Patient requests rx sent to [facility]. All rx sent today 09/14/17.  She spoke with [facility] staff regarding her medications and all questions were answered.  Nobie Putnam, DO [facility] 09/14/2017, 4:59 PM

## 2017-09-15 MED ORDER — OMEPRAZOLE 40 MG PO CPDR
40.0000 mg | DELAYED_RELEASE_CAPSULE | Freq: Every day | ORAL | 1 refills | Status: DC
Start: 2017-09-15 — End: 2018-01-18

## 2017-09-15 NOTE — Addendum Note (Signed)
Addended by: [name] on: 09/15/2017 12:28 PM   Modules accepted: Orders

## 2017-11-22 ENCOUNTER — Encounter: Payer: Self-pay | Admitting: Family Medicine

## 2017-11-22 ENCOUNTER — Ambulatory Visit (INDEPENDENT_AMBULATORY_CARE_PROVIDER_SITE_OTHER): Payer: Medicare Other | Admitting: Family Medicine

## 2017-11-22 VITALS — BP 140/80 | HR 86 | Temp 98.6°F | Resp 16 | Ht 63.5 in | Wt 219.0 lb

## 2017-11-22 DIAGNOSIS — G5603 Carpal tunnel syndrome, bilateral upper limbs: Secondary | ICD-10-CM | POA: Insufficient documentation

## 2017-11-22 DIAGNOSIS — H65492 Other chronic nonsuppurative otitis media, left ear: Secondary | ICD-10-CM | POA: Diagnosis not present

## 2017-11-22 DIAGNOSIS — M159 Polyosteoarthritis, unspecified: Secondary | ICD-10-CM

## 2017-11-22 DIAGNOSIS — M79641 Pain in right hand: Secondary | ICD-10-CM

## 2017-11-22 DIAGNOSIS — M79642 Pain in left hand: Secondary | ICD-10-CM | POA: Diagnosis not present

## 2017-11-22 DIAGNOSIS — E1142 Type 2 diabetes mellitus with diabetic polyneuropathy: Secondary | ICD-10-CM

## 2017-11-22 DIAGNOSIS — M150 Primary generalized (osteo)arthritis: Secondary | ICD-10-CM

## 2017-11-22 DIAGNOSIS — G629 Polyneuropathy, unspecified: Secondary | ICD-10-CM | POA: Diagnosis not present

## 2017-11-22 DIAGNOSIS — M5441 Lumbago with sciatica, right side: Secondary | ICD-10-CM

## 2017-11-22 DIAGNOSIS — G8929 Other chronic pain: Secondary | ICD-10-CM | POA: Diagnosis not present

## 2017-11-22 DIAGNOSIS — M1990 Unspecified osteoarthritis, unspecified site: Secondary | ICD-10-CM

## 2017-11-22 DIAGNOSIS — M542 Cervicalgia: Secondary | ICD-10-CM

## 2017-11-22 LAB — POCT UA - MICROALBUMIN: MICROALBUMIN (UR) POC: 50 mg/L

## 2017-11-22 MED ORDER — PREDNISONE 10 MG PO TABS: ORAL_TABLET | ORAL | 0 refills | Status: DC

## 2017-11-22 NOTE — Patient Instructions (Addendum)
Thank you for coming to the office today.  My recommendation is to check labs first and if abnormal we will refer you to Rheumatology [facility])   [facility] [place], [place] [address] Hours: M-Th 8-5pm / F 8-12noon Phone: [phone]  Will order Rheumatology screening labs - Rheumatoid Factor - Anti-CCP - ESR (Sed Rate) - CRP  If abnormal will refer  ------------------  Wait until labs resulted then if normal can return to Marin Olp PA at [facility] for re-evaluation of Neck / C spine and Carpal Tunnel / Wrists  For both the left ear fluid or effusion, and sciatica right low back and hand/neck pain - likely nerve compression, will prescribe Prednisone taper over 6 days  Your provider would like to you have your annual eye exam. Please contact your current eye doctor or here are some good options for you to contact.   [facility]   Address: [address] [place], [place] [address] Phone: [phone]  Website: [email or web address] [address], [place], [place] [address] Phone: [phone] [email or web address]  [facility]  Address: [place], [place], [place] [address] Phone: [phone]   [facility] [address] [place], [place] [address] Phone: [phone]  [facility] Address: [place], [place], [place] [address]  Phone: [phone]   DUE for [place] (no food or drink after midnight before the lab appointment, only water or coffee without cream/sugar on the morning of)  SCHEDULE "Lab Only" visit in the morning at the clinic for lab draw in 1-2 days  For Lab Results, once available within 2-3 days of blood draw, you can can log in to MyChart online to view your results and a brief explanation. Also, we can discuss results at next follow-up visit.   Please schedule a Follow-up Appointment to: Return  in about 6 weeks (around 01/03/2018) for DM A1c, follow-up Neck/Hand Pain [facility] update).  If you have any other questions or concerns, please feel free to call the office or send a message through [place]. You may also schedule an earlier appointment if necessary.  Additionally, you may be receiving a survey about your experience at our office within a few days to 1 week by e-mail or mail. We value your feedback.  Nobie Putnam, DO [place]

## 2017-11-22 NOTE — Progress Notes (Signed)
Subjective:    Patient ID: Erin Potter, female    DOB: 1961-04-23, 57 y.o.   MRN: [id number]  Erin Potter is a 57 y.o. female presenting on 11/22/2017 for Muscle Pain; Ear Fullness; and Carpal Tunnel   HPI  Left Ear, Pressure / Effusion Reports for past 1.5 to 2 months has had problem with worsening left ear fullness, feels like she can "hear herself in her head" with pressure and feels like "fluid" behind ear, she was advised by sister to take Flonase, she tried this already 2 sprays each side of nose for >1 month by her report without any relief. - Not tried other medication. She is not followed by ENT currently Admits some sinus congestion previously but improved - Denies fever or chills, or ear pain or headache  Chronic Pain Syndrome Multiple Joints / Bilateral Hands and Upper Ext / Chronic Neck Pain / H/o Carpal Tunnel - Last visit with me 08/2017 and in past, for same problem, treated with continued current therapy on Gabapentin, NSAID, wrist splints, topical diclofenac gel, and f/u with Ortho/Neurosurgery, see prior notes for background information. - Interval update with now seems persistent worsening, affecting her function worse with neck pain radiating into arms and difficulty lifting R arm at times, shoulder also with bilateral hands/thumb and wrist causing pain and tingling - Today patient reports she is asking for referral to Rheumatologist due to bilateral hand pain, she is concerned about rheumatoid arthritis, she has no prior diagnosis or significant fam history, she has had prior labs done in 2015 with ESR 26 and CRP 16.5 elevated. No other labs available in EHR at this time. - She was followed by [facility] Marin Olp PA (at office of Dr Era Bumpers) has had prior dx of Cervicalgia and multiple level DJD C-spine, has been evaluated with MRI in 04/2017 and recommended ESI series, patient has declined and did not follow back up since the injection  would "not fix the problem" she is frustrated and hopeful of a more long term fix. Her prior injection in thumb did not last long only few weeks, this was done by Orthopedics ([facility] - Dr Gevena Barre) they also gave her the wrist splints, now worn down with less effective - Still taking medicines limited relief - She is requesting 2nd opinion or further testing  Additional complaints - Acute R sided low back pain with radiating pain / sciatica down RLE into gluteal and thigh region, worse with standing up from seated position, for several weeks, no clear trigger, but has known arthritis. Not improved by prior medications.  CHRONIC DM, Type 2: - Previously elevated A1c in 7 range last 09/2017, due for urine microalbumin, will give specimen today, not on ACEi / ARB Denies hypoglycemia, polyuria, visual changes, numbness or tingling.   Depression screen [facility] 2/9 01/14/2017 10/01/2016 06/15/2016  Decreased Interest 1 3 0  Down, Depressed, Hopeless 1 3 0  PHQ - 2 Score 2 6 0  Altered sleeping 2 3 -  Tired, decreased energy 2 3 -  Change in appetite 0 2 -  Feeling bad or failure about yourself  0 1 -  Trouble concentrating 0 0 -  Moving slowly or fidgety/restless 0 0 -  Suicidal thoughts 0 0 -  PHQ-9 Score 6 15 -  Difficult doing work/chores - - -    Social History   Tobacco Use  . Smoking status: Former Smoker    Packs/day: 1.50  Years: 35.00    Pack years: 52.50    Types: Cigarettes    Last attempt to quit: 02/24/2014    Years since quitting: 3.7  . Smokeless tobacco: Never Used  Substance Use Topics  . Alcohol use: No    Alcohol/week: 0.0 oz  . Drug use: No    Review of Systems Per HPI unless specifically indicated above     Objective:    BP 140/80   Pulse 86   Temp 98.6 F (37 C) (Oral)   Resp 16   Ht 5' 3.5" (1.613 m)   Wt 219 lb (99.3 kg)   SpO2 97%   BMI 38.19 kg/m   Wt Readings from Last 3 Encounters:  11/22/17 219 lb (99.3 kg)  08/12/17 224 lb 1.6  oz (101.7 kg)  07/29/17 225 lb 7 oz (102.3 kg)    Physical Exam  Constitutional: She is oriented to person, place, and time. She appears well-developed and well-nourished. No distress.  Currently well appearing, comfortable, cooperative, obese  HENT:  Head: Normocephalic and atraumatic.  Mouth/Throat: Oropharynx is clear and moist.  Frontal / maxillary sinuses non-tender. Nares patent without purulence or edema. Left TM with opaque effusion and some slight erythema, without bulging. Oropharynx clear without erythema, exudates, edema or asymmetry.  Eyes: Conjunctivae are normal. Right eye exhibits no discharge. Left eye exhibits no discharge.  Neck:  Limited rotation of neck due to pain and some muscle spasm hypertonicity.  Cardiovascular: Normal rate, regular rhythm, normal heart sounds and intact distal pulses.  No murmur heard. Pulmonary/Chest: Effort normal and breath sounds normal. No respiratory distress. She has no wheezes. She has no rales.  Musculoskeletal: Normal range of motion. She exhibits no edema.  Bilateral Thumbs / [place] Does not have splint on today Minimal swelling over bilateral L>R thenar eminence, bilateral limited range of motion due to pain and stiffness. Other fingers with some slight bulkiness DIP joints, full active ROM No focal tenderness on exam, no erythema or other deformity hand wrist Grip strength is intact - poor effort on exam d/t pain Distal sensation intact  Right Low Back Large body habitus Non reproducible pain on exam but has region of pain low back/gluteal upper thigh worse on position change provoked by standing up from chair. Not reproduced radicular symptoms on leg extension SLR  Lymphadenopathy:    She has no cervical adenopathy.  Neurological: She is alert and oriented to person, place, and time.  Distal sensation intact to light touch.  Skin: Skin is warm and dry. No rash noted. She is not diaphoretic. No erythema.  Psychiatric: She has a  normal mood and affect. Her behavior is normal.  Well groomed, good eye contact, normal speech and thoughts. Improved mood not labile today. Congruent affect.  Nursing note and vitals reviewed.  Results for orders placed or performed in visit on 11/22/17  POCT UA - Microalbumin  Result Value Ref Range   Microalbumin Ur, POC 50 mg/L      Assessment & Plan:   Problem List Items Addressed This Visit    Carpal tunnel syndrome, bilateral   Relevant Medications   predniSONE (DELTASONE) 10 MG tablet   Chronic neck pain   Relevant Medications   predniSONE (DELTASONE) 10 MG tablet   Neuropathy   Relevant Medications   predniSONE (DELTASONE) 10 MG tablet   Osteoarthritis of multiple joints - Primary   Relevant Medications   predniSONE (DELTASONE) 10 MG tablet   Other Relevant Orders   Sed  Rate (ESR)   C-reactive protein   Cyclic citrul peptide antibody, IgG   Rheumatoid Factor   Type 2 diabetes, controlled, with peripheral neuropathy (HCC)   Relevant Orders   POCT UA - Microalbumin (Completed)    Other Visit Diagnoses    Chronic middle ear effusion, left       Relevant Medications   predniSONE (DELTASONE) 10 MG tablet   Acute right-sided low back pain with right-sided sciatica       Relevant Medications   predniSONE (DELTASONE) 10 MG tablet   Bilateral hand pain       Relevant Orders   Sed Rate (ESR)   C-reactive protein   Cyclic citrul peptide antibody, IgG   Rheumatoid Factor   Arthritis       Relevant Medications   predniSONE (DELTASONE) 10 MG tablet   Other Relevant Orders   Sed Rate (ESR)   C-reactive protein   Cyclic citrul peptide antibody, IgG   Rheumatoid Factor      Chronic progressive multiple joint pain bilateral hands, thumbs, wrist R forearm, R shoulder and neck, also with acute on chronic RLBP w/ sciatica. Known OA/DJD multiple joints, prior MRI of neck confirm multi level DJD / cervicalgia with some foraminal stenosis, also prior document of carpal  tunnel syndrome for hands. Exam most consistent with OA of hands, not entirely consistent with RA based on exam features - Given complexity of her chronic pain and multiple areas also prior work-up / specialists involved, reviewed my recommendation to continue f/u with previous providers for next plan - as she stopped f/u with them and did not pursue their recommendations - I did agree to check labs to rule out possible RA since she has not had recent screening labs, given generalized pain, it is possible to have underlying RA inflammatory component - Otherwise likely combination of OA, carpal tunnel and cervicalgia  Plan Check labs return tomorrow to office for lab only - ESR, CRP, anti-CCP, RF - if any positive or abnormal results will refer to [facility] as requested for 2nd opinion - If labs normal or regardless, advised her to f/u back with [facility] Marin Olp PA for re-discuss options for upper extremity neck and arm pain - suspect she is candidate for ESI as previously offered, may help her hands as well if related, otherwise [place] [facility] may be able to provider options for carpal tunnel and hand pain - Today rx Prednisone taper of 19m down to 123mover 6 days burst/taper - for carpal tunnel/hand pain, LBP-sciatica and also L ear effusion - Limited other medical options from me as her PCP - we can review possibility of dx Fibromyalgia in future if other testing and evaluations ultimately are unsuccessful. But she does appear to have confirm bony degeneration instead as likely cause. - Follow-up as planned 6 weeks for DM A1c visit and review progress of her chronic pain   Additional L ear effusion without obvious sign of AOM or bacterial infection, seems persistent for >1-2 months, failed nasal steroid. Trial on prednisone, if unsuccessful may refer to ENT if need.  Meds ordered this encounter  Medications  . predniSONE (DELTASONE) 10 MG tablet    Sig: Take 6 tabs with breakfast  Day 1, 5 tabs Day 2, 4 tabs Day 3, 3 tabs Day 4, 2 tabs Day 5, 1 tab Day 6.    Dispense:  21 tablet    Refill:  0    Follow up plan: Return in about  6 weeks (around 01/03/2018) for DM A1c, follow-up Neck/Hand Pain [facility] update).  Future labs ordered for lab only 11/23/17 tomorrow for rheumatology screening labs  Nobie Putnam, [facility] 11/22/2017, 5:14 PM

## 2017-11-23 ENCOUNTER — Other Ambulatory Visit: Payer: Medicare Other

## 2017-11-25 DIAGNOSIS — M79641 Pain in right hand: Secondary | ICD-10-CM | POA: Diagnosis not present

## 2017-11-25 DIAGNOSIS — M1990 Unspecified osteoarthritis, unspecified site: Secondary | ICD-10-CM | POA: Diagnosis not present

## 2017-11-25 DIAGNOSIS — M150 Primary generalized (osteo)arthritis: Secondary | ICD-10-CM | POA: Diagnosis not present

## 2017-11-25 DIAGNOSIS — M79642 Pain in left hand: Secondary | ICD-10-CM | POA: Diagnosis not present

## 2017-11-28 LAB — RHEUMATOID FACTOR

## 2017-11-28 LAB — C-REACTIVE PROTEIN: CRP: 26.4 mg/L — AB (ref <8.0)

## 2017-11-28 LAB — CYCLIC CITRUL PEPTIDE ANTIBODY, IGG: Cyclic Citrullin Peptide Ab: <16 UNITS

## 2017-11-28 LAB — SEDIMENTATION RATE: SED RATE: 22 mm/h (ref 0–30)

## 2017-12-09 DIAGNOSIS — M79645 Pain in left finger(s): Secondary | ICD-10-CM | POA: Diagnosis not present

## 2017-12-09 DIAGNOSIS — M79644 Pain in right finger(s): Secondary | ICD-10-CM | POA: Diagnosis not present

## 2017-12-14 ENCOUNTER — Telehealth: Payer: Self-pay | Admitting: Family Medicine

## 2017-12-14 NOTE — Telephone Encounter (Signed)
She was last seen 11/22/17 given 6 day prednisone taper for joint pain and also ear effusion and pressure.  Her labs were reviewed and we decided not to refer her to Rheumatology at this time.  She was advised to follow-up with her Spine Specialist - Marin Olp PA at [facility] for re-evaluation of Neck / C spine and Carpal Tunnel / Wrists if the treatment did not help her.  If her complaint is that ear pressure did not improve, we can consider referral to ENT if she prefers.  Erin Potter, [facility] 12/14/2017, 1:03 PM

## 2017-12-14 NOTE — Telephone Encounter (Signed)
Pt. Called states that prednisone  Did not work for her.  Pt call back # is  [phone]

## 2017-12-15 ENCOUNTER — Other Ambulatory Visit: Payer: Self-pay | Admitting: Family Medicine

## 2017-12-15 DIAGNOSIS — M545 Low back pain, unspecified: Secondary | ICD-10-CM

## 2017-12-15 DIAGNOSIS — M542 Cervicalgia: Principal | ICD-10-CM

## 2017-12-15 DIAGNOSIS — M79605 Pain in left leg: Secondary | ICD-10-CM

## 2017-12-15 DIAGNOSIS — G8929 Other chronic pain: Secondary | ICD-10-CM

## 2017-12-15 MED ORDER — BACLOFEN 10 MG PO TABS: ORAL_TABLET | ORAL | 1 refills | Status: DC

## 2017-12-15 NOTE — Addendum Note (Signed)
Addended by: [name] on: 12/15/2017 01:47 PM   Modules accepted: Orders

## 2017-12-15 NOTE — Telephone Encounter (Signed)
Patient advised.

## 2017-12-19 ENCOUNTER — Other Ambulatory Visit: Payer: Self-pay | Admitting: Family Medicine

## 2017-12-19 DIAGNOSIS — F411 Generalized anxiety disorder: Secondary | ICD-10-CM

## 2017-12-19 DIAGNOSIS — F331 Major depressive disorder, recurrent, moderate: Secondary | ICD-10-CM

## 2018-01-18 ENCOUNTER — Telehealth: Payer: Self-pay | Admitting: Family Medicine

## 2018-01-18 DIAGNOSIS — K219 Gastro-esophageal reflux disease without esophagitis: Secondary | ICD-10-CM

## 2018-01-18 MED ORDER — OMEPRAZOLE 40 MG PO CPDR: 40.0000 mg | DELAYED_RELEASE_CAPSULE | Freq: Every day | ORAL | 1 refills | Status: DC

## 2018-01-18 NOTE — Telephone Encounter (Signed)
Pt called requesting refill on Prilosec  20 BID called into  [facility]. Pt have requested that if any questions email her

## 2018-01-23 ENCOUNTER — Emergency Department (HOSPITAL_COMMUNITY): Payer: Self-pay | Admitting: EMERGENCY MEDICINE

## 2018-01-23 DIAGNOSIS — L03011 Cellulitis of right finger: Secondary | ICD-10-CM | POA: Diagnosis not present

## 2018-01-23 DIAGNOSIS — S61250A Open bite of right index finger without damage to nail, initial encounter: Secondary | ICD-10-CM | POA: Diagnosis not present

## 2018-02-02 ENCOUNTER — Telehealth: Payer: Self-pay

## 2018-02-02 ENCOUNTER — Other Ambulatory Visit: Payer: Self-pay | Admitting: Family Medicine

## 2018-02-02 DIAGNOSIS — E1142 Type 2 diabetes mellitus with diabetic polyneuropathy: Secondary | ICD-10-CM

## 2018-02-02 DIAGNOSIS — K219 Gastro-esophageal reflux disease without esophagitis: Secondary | ICD-10-CM

## 2018-02-02 DIAGNOSIS — E034 Atrophy of thyroid (acquired): Secondary | ICD-10-CM

## 2018-02-02 MED ORDER — GLUCOSE BLOOD VI STRP: ORAL_STRIP | 12 refills | Status: DC

## 2018-02-02 MED ORDER — ACCU-CHEK SOFT TOUCH LANCETS MISC: 12 refills | Status: DC

## 2018-02-02 MED ORDER — OMEPRAZOLE 20 MG PO CPDR: 20.0000 mg | DELAYED_RELEASE_CAPSULE | Freq: Two times a day (BID) | ORAL | 1 refills | Status: DC

## 2018-02-02 NOTE — Telephone Encounter (Signed)
Medications were refilled.  Patient may call Dr Dorothey Baseman office to request appointment for colonoscopy. She should not need a new referral for this, if they say that she does need referral then we can place one.  Nobie Putnam, [facility] 02/02/2018, 2:52 PM

## 2018-02-02 NOTE — Telephone Encounter (Signed)
Patient called needing the following:  Omeprazole 20mg  BID Lancets Test Strips Test 2 a day. [facility]   Also, who did her colonoscopy?  She would like the information to call them to make a follow up appt.    Just FYI,  I gave patient info in her mammogram and she will call to make her appointment for mammogram.

## 2018-02-02 NOTE — Telephone Encounter (Signed)
Dr. [name] had done colonoscopy on 02/21/2015 patient is requesting another one and Rx send for 20 mg BID.

## 2018-02-14 ENCOUNTER — Encounter (INDEPENDENT_AMBULATORY_CARE_PROVIDER_SITE_OTHER): Payer: Self-pay

## 2018-02-23 ENCOUNTER — Other Ambulatory Visit: Payer: Self-pay | Admitting: Family Medicine

## 2018-02-23 DIAGNOSIS — Z1231 Encounter for screening mammogram for malignant neoplasm of breast: Secondary | ICD-10-CM

## 2018-02-27 ENCOUNTER — Other Ambulatory Visit: Payer: Self-pay | Admitting: Family Medicine

## 2018-02-27 DIAGNOSIS — G629 Polyneuropathy, unspecified: Secondary | ICD-10-CM

## 2018-02-28 ENCOUNTER — Telehealth: Payer: Self-pay | Admitting: Gastroenterology

## 2018-02-28 NOTE — Telephone Encounter (Signed)
Pt   Is calling to schedule colonoscopy  she states  Does not need a referrral CB [phone] she would like a call ASAP

## 2018-03-01 ENCOUNTER — Other Ambulatory Visit: Payer: Self-pay

## 2018-03-01 DIAGNOSIS — Z86010 Personal history of colonic polyps: Secondary | ICD-10-CM

## 2018-03-01 NOTE — Telephone Encounter (Signed)
Pt scheduled for a follow up colonoscopy with Dr. Allen Norris at [facility] on 04/04/18.

## 2018-03-07 ENCOUNTER — Encounter: Payer: Self-pay | Admitting: Family Medicine

## 2018-03-07 ENCOUNTER — Ambulatory Visit (INDEPENDENT_AMBULATORY_CARE_PROVIDER_SITE_OTHER): Payer: Medicare Other | Admitting: Family Medicine

## 2018-03-07 VITALS — BP 158/63 | HR 77 | Temp 98.4°F | Resp 16 | Ht 63.5 in | Wt 212.0 lb

## 2018-03-07 DIAGNOSIS — G4733 Obstructive sleep apnea (adult) (pediatric): Secondary | ICD-10-CM | POA: Insufficient documentation

## 2018-03-07 DIAGNOSIS — J439 Emphysema, unspecified: Secondary | ICD-10-CM | POA: Diagnosis not present

## 2018-03-07 DIAGNOSIS — L84 Corns and callosities: Secondary | ICD-10-CM | POA: Diagnosis not present

## 2018-03-07 DIAGNOSIS — E034 Atrophy of thyroid (acquired): Secondary | ICD-10-CM | POA: Diagnosis not present

## 2018-03-07 DIAGNOSIS — I10 Essential (primary) hypertension: Secondary | ICD-10-CM

## 2018-03-07 DIAGNOSIS — E1142 Type 2 diabetes mellitus with diabetic polyneuropathy: Secondary | ICD-10-CM | POA: Diagnosis not present

## 2018-03-07 NOTE — Progress Notes (Signed)
Subjective:    Patient ID: Erin Potter, female    DOB: [birth year], 57 y.o.   MRN: [id number]  Roselind Klus is a 57 y.o. female presenting on 03/07/2018 for Diabetes   HPI   Excessive Sleepiness / History of OSA / Fatigue / Tired Reports chronic problem with excessive sleepiness, describes daytime sleepiness, she had prior sleep study PSG showed OSA in past, she was on CPAP but has been off of this treatment. - She admits to concerns with post menopausal symptoms affecting her, requesting to go on hormone therapy, she would like to discuss with GYN Dr Amalia Hailey WS  Left ear pressure, fluid / Eustachian Tube Dysfunction Reports prior issue in past for while now, does not seem to improve has persistent sinus pressure and headache and feels like she "cant hear herself"  - trial on nose sprays and prednisone in past with limited relief She request contact info for [facility] to schedule.  CHRONIC DM, Type 2: Reports that she is here for A1c check, was asked to come in and she denied POC A1c requested lab draw No immediate concerns regarding sugar. CBGs: does not have cbg report Meds: Metformin 554m BID Reports good compliance. Tolerating well w/o side-effects Currently not on ACEi / ARB - last checked 11/2017 Admits Bilateral Foot Deformity / Callus both feet lateral aspect has some issue with discomfort and thickening skin, she request to go to podiatry Denies hypoglycemia, polyuria, visual changes, numbness or tingling.  Morbid Obesity Weight loss 12 lbs in >6 months Improved diet, recently   COPD - stable without flare up  Hypothyroidism - on levothyroxine 177m daily  HTN - elevated blood pressure readings, not checking regularly at home. Declines change in medication  Health Maintenance:  Due for pneumonia vaccine - pneumovax-23 with history of DM, < age [age]Declines now thinks she had before will check records  Due Flu vaccine, will return when in  stock.  [facility] 03/07/2018 01/14/2017 10/01/2016  Decreased Interest '3 1 3  ' Down, Depressed, Hopeless '3 1 3  ' PHQ - 2 Score '6 2 6  ' Altered sleeping '3 2 3  ' Tired, decreased energy '3 2 3  ' Change in appetite 0 0 2  Feeling bad or failure about yourself  0 0 1  Trouble concentrating 0 0 0  Moving slowly or fidgety/restless 0 0 0  Suicidal thoughts 0 0 0  PHQ-9 Score '12 6 15  ' Difficult doing work/chores Somewhat difficult - -    Social History   Tobacco Use  . Smoking status: Former Smoker    Packs/day: 1.50    Years: 35.00    Pack years: 52.50    Types: Cigarettes    Last attempt to quit: 02/24/2014    Years since quitting: 4.0  . Smokeless tobacco: Former [employer] Topics  . Alcohol use: No    Alcohol/week: 0.0 standard drinks  . Drug use: No    Review of Systems Per HPI unless specifically indicated above     Objective:    BP (!) 158/63   Pulse 77   Temp 98.4 F (36.9 C) (Oral)   Resp 16   Ht 5' 3.5" (1.613 m)   Wt 212 lb (96.2 kg)   SpO2 99%   BMI 36.97 kg/m   Wt Readings from Last 3 Encounters:  03/07/18 212 lb (96.2 kg)  11/22/17 219 lb (99.3 kg)  08/12/17 224 lb 1.6 oz (101.7 kg)  Physical Exam  Constitutional: She is oriented to person, place, and time. She appears well-developed and well-nourished. No distress.  Well-appearing, comfortable, cooperative  HENT:  Head: Normocephalic and atraumatic.  Mouth/Throat: Oropharynx is clear and moist.  Eyes: Conjunctivae are normal. Right eye exhibits no discharge. Left eye exhibits no discharge.  Neck: Normal range of motion. Neck supple. No thyromegaly present.  Cardiovascular: Normal rate, regular rhythm, normal heart sounds and intact distal pulses.  No murmur heard. Pulmonary/Chest: Effort normal and breath sounds normal. No respiratory distress. She has no wheezes. She has no rales.  Musculoskeletal: Normal range of motion. She exhibits no edema.  Left wrist guard in place    Lymphadenopathy:    She has no cervical adenopathy.  Neurological: She is alert and oriented to person, place, and time.  Skin: Skin is warm and dry. No rash noted. She is not diaphoretic. No erythema.  Psychiatric: She has a normal mood and affect. Her behavior is normal.  Well groomed, good eye contact, normal speech and thoughts. Tearful at times discussing her mental health history and life stressors.  Nursing note and vitals reviewed.    Diabetic Foot Exam - Simple   Simple Foot Form Diabetic Foot exam was performed with the following findings:  Yes 03/07/2018  9:45 AM  Visual Inspection See comments:  Yes Sensation Testing Intact to touch and monofilament testing bilaterally:  Yes Pulse Check Posterior Tibialis and Dorsalis pulse intact bilaterally:  Yes Comments Some mild deformity bilateral feet lateral aspect of both with some thickened dry skin callus formation     Results for orders placed or performed in visit on 11/23/17  Rheumatoid Factor  Result Value Ref Range   Rhuematoid fact SerPl-aCnc <13 <24 IU/mL  Cyclic citrul peptide antibody, IgG  Result Value Ref Range   Cyclic Citrullin Peptide Ab <16 UNITS  C-reactive protein  Result Value Ref Range   CRP 26.4 (H) <8.0 mg/L  Sed Rate (ESR)  Result Value Ref Range   Sed Rate 22 0 - 30 mm/h      Assessment & Plan:   Problem List Items Addressed This Visit    COPD (chronic obstructive pulmonary disease) (HCC) Stable, without active flare Follow-up PRN    Essential hypertension Elevated BP, improve on re-check Recommend adjusting medication in future - monitor readings, needs to check BP outside office Check labs    Relevant Orders   CBC with Differential/Platelet   BASIC METABOLIC PANEL WITH GFR   Hypothyroidism Due for labs, TSH Free T4 Continue current levothyroxine f/u based on labs    Relevant Orders   TSH   T4, free   Morbid obesity (HCC) Weight loss in past 6 months Encourage continue  improved lifestyle    Relevant Orders   BASIC METABOLIC PANEL WITH GFR   OSA (obstructive sleep apnea) Likely etiology for persistent daytime sleepiness, known OSA but uncontrolled Related to weight, affecting BP most likely Encourage her to resume CPAP therapy    Relevant Orders   CBC with Differential/Platelet   Type 2 diabetes, controlled, with peripheral neuropathy (HCC) - Primary Overdue for DM follow-up Check A1c lab today, declined POC A1c Continue Metformin Discuss additional therapy options in near future - offer GLP1 Due DM foot today Needs to schedule Ophtho exam overdue Referral to Podiatry [facility] Follow-up    Relevant Orders   Hemoglobin A1c      #Eustachian Tube Dysfunction Advised to schedule w/ ENT if needs new referral may contact [facility] to  schedule  #GYN / Postmenopausal May follow-up with established GYN Dr Amalia Hailey to discuss options with hormone replacement therapy Advised her that I do not routinely prescribe this treatment  No orders of the defined types were placed in this encounter.  Orders Placed This Encounter  Procedures  . Hemoglobin A1c  . CBC with Differential/Platelet  . BASIC METABOLIC PANEL WITH GFR  . TSH  . T4, free  . Ambulatory referral to Podiatry    Referral Priority:   Routine    Referral Type:   Consultation    Referral Reason:   Specialty Services Required    Requested Specialty:   Podiatry    Number of Visits Requested:   1   Follow up plan: Return in about 6 months (around 09/07/2018) for DM A1c, OSA, Sleepiness, Neuropathy.  Nobie Putnam, [facility] 03/07/2018, 6:20 PM

## 2018-03-07 NOTE — Patient Instructions (Addendum)
Thank you for coming to the office today.  I would recommend getting back on track with CPAP machine to see if can help reduce sleepiness.  Lab tests today to check A1c sugar, blood counts for anemia and thyroid.  If interested to pursue hormone therapy with estrogen please call back to GYN and discuss with Dr Amalia Hailey at [facility] referral to ENT for Left ear eustachian tube dysfunction and fullness - may need further treatment  [facility] [address]  [place], [place] [address] Ph: [phone] --------------------------------------------  Referral sent  [place] Address: [address] [place], [place] [address] Hours: Open 8AM-5PM Phone: [phone]  ----------------------------------------------------------  Your provider would like to you have your annual eye exam. Please contact your current eye doctor or here are some good options for you to contact.   [facility]   Address: [address] [place], [place] [address] Phone: [phone]  Website: [email or web address] [address] [place], [place] [address] Phone: [phone] [email or web address]  [facility]  Address: [place], [place], [place] [address] Phone: [phone]   [facility] [address] [place], [place] [address] Phone: [phone]  [facility] Address: [place], [place], [place] [address]  Phone: [phone]   -------------------- You saw the following providers at [place], [place], MD  [place], [place] [address]  [phone]  [phone] ([address]    Marin Olp [place], [place], [place] [address]  [phone]    Please schedule a Follow-up Appointment to: Return in about 6 months (around 09/07/2018) for DM A1c, OSA, Sleepiness, Neuropathy.  If you  have any other questions or concerns, please feel free to call the office or send a message through [place]. You may also schedule an earlier appointment if necessary.  Additionally, you may be receiving a survey about your experience at our office within a few days to 1 week by e-mail or mail. We value your feedback.  Nobie Putnam, DO [place]

## 2018-03-08 LAB — BASIC METABOLIC PANEL WITH GFR
BUN: 14 mg/dL (ref 7–25)
CO2: 25 mmol/L (ref 20–32)
CREATININE: 0.65 mg/dL (ref 0.50–1.05)
Calcium: 9.4 mg/dL (ref 8.6–10.4)
Chloride: 106 mmol/L (ref 98–110)
GFR, EST AFRICAN AMERICAN: 114 mL/min/{1.73_m2} (ref >=60)
GFR, EST NON AFRICAN AMERICAN: 99 mL/min/{1.73_m2} (ref >=60)
Glucose, Bld: 123 mg/dL — ABNORMAL HIGH (ref 65–99)
Potassium: 4.8 mmol/L (ref 3.5–5.3)
Sodium: 141 mmol/L (ref 135–146)

## 2018-03-08 LAB — CBC WITH DIFFERENTIAL/PLATELET
BASOS ABS: 32 {cells}/uL (ref 0–200)
BASOS PCT: 0.5 %
Eosinophils Absolute: 416 cells/uL (ref 15–500)
Eosinophils Relative: 6.5 %
HCT: 37 % (ref 35.0–45.0)
HEMOGLOBIN: 12.2 g/dL (ref 11.7–15.5)
LYMPHS ABS: 1613 {cells}/uL (ref 850–3900)
MCH: 27.4 pg (ref 27.0–33.0)
MCHC: 33 g/dL (ref 32.0–36.0)
MCV: 83.1 fL (ref 80.0–100.0)
MPV: 12.1 fL (ref 7.5–12.5)
Monocytes Relative: 7.5 %
NEUTROS PCT: 60.3 %
Neutro Abs: 3859 cells/uL (ref 1500–7800)
PLATELETS: 126 10*3/uL — AB (ref 140–400)
RBC: 4.45 10*6/uL (ref 3.80–5.10)
RDW: 14.9 % (ref 11.0–15.0)
Total Lymphocyte: 25.2 %
WBC: 6.4 10*3/uL (ref 3.8–10.8)
WBCMIX: 480 {cells}/uL (ref 200–950)

## 2018-03-08 LAB — TSH: TSH: 0.19 mIU/L — ABNORMAL LOW (ref 0.40–4.50)

## 2018-03-08 LAB — HEMOGLOBIN A1C
HEMOGLOBIN A1C: 6.5 %{Hb} — AB (ref <5.7)
Mean Plasma Glucose: 140 (calc)
eAG (mmol/L): 7.7 (calc)

## 2018-03-08 LAB — T4, FREE: FREE T4: 1.2 ng/dL (ref 0.8–1.8)

## 2018-03-28 ENCOUNTER — Other Ambulatory Visit: Payer: Self-pay | Admitting: Nurse Practitioner

## 2018-03-28 DIAGNOSIS — G629 Polyneuropathy, unspecified: Secondary | ICD-10-CM

## 2018-03-31 ENCOUNTER — Other Ambulatory Visit: Payer: Self-pay

## 2018-03-31 MED ORDER — ESOMEPRAZOLE MAGNESIUM 20 MG PO PACK: 20.0000 mg | PACK | Freq: Two times a day (BID) | ORAL | 1 refills | Status: DC

## 2018-04-03 ENCOUNTER — Ambulatory Visit: Payer: Medicaid Other | Admitting: Podiatry

## 2018-04-03 ENCOUNTER — Telehealth: Payer: Self-pay | Admitting: Gastroenterology

## 2018-04-03 NOTE — Telephone Encounter (Signed)
Patient has been contacted to advise that there is a cancellation fee that applies to any procedures not canceled within 48 hours prior to scheduled procedure date.  She was very upset about the fee, and stated that she had things lined up to have her colonoscopy but she did not think it was a good idea for her to have her colonoscopy since she is having problems with her asthma right now.  I discussed this matter with our [place] our office manager and she said to inform patient since she is sick we will not charge her the cancellation fee.  Patient is appreciative and she will call back to reschedule her colonoscopy with Dr. Allen Norris when she is feeling better.  Thanks [employer]

## 2018-04-03 NOTE — Telephone Encounter (Signed)
Sharyn Lull, will you please call pt and make sure she is aware of the late cancellation fee? Thank you!

## 2018-04-03 NOTE — Telephone Encounter (Signed)
Pt has procedure tomorrow with Dr. Allen Norris she is sick and wants to cancel she will call us back when she is better to r/s

## 2018-04-04 ENCOUNTER — Encounter: Admission: RE | Payer: Self-pay | Source: Ambulatory Visit

## 2018-04-04 ENCOUNTER — Ambulatory Visit: Admission: RE | Admit: 2018-04-04 | Payer: Medicare Other | Source: Ambulatory Visit | Admitting: Gastroenterology

## 2018-04-04 SURGERY — COLONOSCOPY WITH PROPOFOL
Anesthesia: General

## 2018-04-05 ENCOUNTER — Telehealth: Payer: Self-pay

## 2018-04-05 ENCOUNTER — Telehealth: Payer: Self-pay | Admitting: Family Medicine

## 2018-04-05 ENCOUNTER — Ambulatory Visit
Admission: RE | Admit: 2018-04-05 | Discharge: 2018-04-05 | Disposition: A | Discharge status: Home / Self Care | Payer: Medicare Other | Source: Ambulatory Visit | Attending: Family Medicine | Admitting: Family Medicine

## 2018-04-05 ENCOUNTER — Inpatient Hospital Stay (HOSPITAL_COMMUNITY): Admit: 2018-04-05 | Discharge: 2018-04-05 | Disposition: A | Discharge status: Home / Self Care | Payer: Self-pay

## 2018-04-05 DIAGNOSIS — K219 Gastro-esophageal reflux disease without esophagitis: Secondary | ICD-10-CM

## 2018-04-05 DIAGNOSIS — Z1231 Encounter for screening mammogram for malignant neoplasm of breast: Secondary | ICD-10-CM | POA: Diagnosis not present

## 2018-04-05 MED ORDER — ESOMEPRAZOLE MAGNESIUM 20 MG PO CPDR: 20.0000 mg | DELAYED_RELEASE_CAPSULE | Freq: Every day | ORAL | 0 refills | Status: DC

## 2018-04-05 MED ORDER — ESOMEPRAZOLE MAGNESIUM 20 MG PO CPDR: 20.0000 mg | DELAYED_RELEASE_CAPSULE | Freq: Two times a day (BID) | ORAL | 0 refills | Status: DC

## 2018-04-05 NOTE — Telephone Encounter (Signed)
Attempted to contact the pt, no answer. VM states the person you are trying to reach cannot take your call right now. LMOM to return my call.

## 2018-04-05 NOTE — Telephone Encounter (Signed)
The called back stating the Nexium prescription was sent over incorrectly. It should be twice daily.

## 2018-04-05 NOTE — Telephone Encounter (Signed)
Pt needs a refill on nexium.  It was filled but it was the wrong thing.  It was not the tablets.  Please call [phone]

## 2018-04-05 NOTE — Telephone Encounter (Signed)
Completed for capsules

## 2018-04-14 ENCOUNTER — Telehealth: Payer: Self-pay | Admitting: Family Medicine

## 2018-04-14 DIAGNOSIS — G629 Polyneuropathy, unspecified: Secondary | ICD-10-CM

## 2018-04-14 MED ORDER — GABAPENTIN 400 MG PO CAPS: 400.0000 mg | ORAL_CAPSULE | Freq: Three times a day (TID) | ORAL | 1 refills | Status: DC

## 2018-04-14 NOTE — Telephone Encounter (Signed)
Pt have requested that you call her at  [phone]

## 2018-04-14 NOTE — Telephone Encounter (Signed)
Refilled gabapentin 90 day supply  Nobie Putnam, DO [facility] 04/14/2018, 5:27 PM

## 2018-04-29 ENCOUNTER — Other Ambulatory Visit: Payer: Self-pay | Admitting: Nurse Practitioner

## 2018-04-29 DIAGNOSIS — K219 Gastro-esophageal reflux disease without esophagitis: Secondary | ICD-10-CM

## 2018-05-02 ENCOUNTER — Other Ambulatory Visit: Payer: Self-pay

## 2018-05-02 DIAGNOSIS — Z86010 Personal history of colonic polyps: Secondary | ICD-10-CM

## 2018-05-02 MED ORDER — NA SULFATE-K SULFATE-MG SULF 17.5-3.13-1.6 GM/177ML PO SOLN: 1.0000 | Freq: Once | ORAL | 0 refills | Status: AC

## 2018-05-03 ENCOUNTER — Encounter: Payer: Self-pay | Admitting: *Deleted

## 2018-05-03 ENCOUNTER — Other Ambulatory Visit: Payer: Self-pay

## 2018-05-04 ENCOUNTER — Telehealth: Payer: Self-pay | Admitting: Family Medicine

## 2018-05-04 ENCOUNTER — Other Ambulatory Visit: Payer: Self-pay | Admitting: Family Medicine

## 2018-05-04 DIAGNOSIS — J431 Panlobular emphysema: Secondary | ICD-10-CM

## 2018-05-04 NOTE — Telephone Encounter (Signed)
PT would like for you to call her on her cell

## 2018-05-05 ENCOUNTER — Ambulatory Visit (INDEPENDENT_AMBULATORY_CARE_PROVIDER_SITE_OTHER): Payer: Medicare Other | Admitting: Obstetrics and Gynecology

## 2018-05-05 ENCOUNTER — Encounter: Payer: Self-pay | Admitting: Obstetrics and Gynecology

## 2018-05-05 VITALS — BP 150/83 | HR 87 | Ht 63.5 in | Wt 214.0 lb

## 2018-05-05 DIAGNOSIS — Z01419 Encounter for gynecological examination (general) (routine) without abnormal findings: Secondary | ICD-10-CM

## 2018-05-05 DIAGNOSIS — Z0000 Encounter for general adult medical examination without abnormal findings: Secondary | ICD-10-CM

## 2018-05-05 MED ORDER — NYSTATIN 100000 UNIT/GM EX POWD: Freq: Every day | CUTANEOUS | Status: AC | PRN

## 2018-05-05 NOTE — Telephone Encounter (Signed)
Patient called requesting urogynecologist name send my chart message.
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
## 2018-05-05 NOTE — Progress Notes (Signed)
Pt here today for annual exam.  Last pap 10/19/16, negative. Pt requests flu vaccine.

## 2018-05-05 NOTE — Addendum Note (Signed)
Addended by: Finis Bud on: 05/05/2018 11:05 AM   Modules accepted: Orders

## 2018-05-06 ENCOUNTER — Other Ambulatory Visit: Payer: Self-pay | Admitting: Nurse Practitioner

## 2018-05-06 DIAGNOSIS — K219 Gastro-esophageal reflux disease without esophagitis: Secondary | ICD-10-CM

## 2018-05-10 ENCOUNTER — Telehealth: Payer: Self-pay

## 2018-05-10 NOTE — Telephone Encounter (Signed)
Patient states she contacted [facility] to reschedule her colonoscopy from tomorrow 10/31 to Monday 11/04.  She rescheduled with [place].  She states she has a stomach bug.  Thanks [employer]

## 2018-05-10 NOTE — Discharge Instructions (Signed)
General Anesthesia, Adult, Care After °These instructions provide you with information about caring for yourself after your procedure. Your health care provider may also give you more specific instructions. Your treatment has been planned according to current medical practices, but problems sometimes occur. Call your health care provider if you have any problems or questions after your procedure. °What can I expect after the procedure? °After the procedure, it is common to have: °· Vomiting. °· A sore throat. °· Mental slowness. ° °It is common to feel: °· Nauseous. °· Cold or shivery. °· Sleepy. °· Tired. °· Sore or achy, even in parts of your body where you did not have surgery. ° °Follow these instructions at home: °For at least 24 hours after the procedure: °· Do not: °? Participate in activities where you could fall or become injured. °? Drive. °? Use heavy machinery. °? Drink alcohol. °? Take sleeping pills or medicines that cause drowsiness. °? Make important decisions or sign legal documents. °? Take care of children on your own. °· Rest. °Eating and drinking °· If you vomit, drink water, juice, or soup when you can drink without vomiting. °· Drink enough fluid to keep your urine clear or pale yellow. °· Make sure you have little or no nausea before eating solid foods. °· Follow the diet recommended by your health care provider. °General instructions °· Have a responsible adult stay with you until you are awake and alert. °· Return to your normal activities as told by your health care provider. Ask your health care provider what activities are safe for you. °· Take over-the-counter and prescription medicines only as told by your health care provider. °· If you smoke, do not smoke without supervision. °· Keep all follow-up visits as told by your health care provider. This is important. °Contact a health care provider if: °· You continue to have nausea or vomiting at home, and medicines are not helpful. °· You  cannot drink fluids or start eating again. °· You cannot urinate after 8-12 hours. °· You develop a skin rash. °· You have fever. °· You have increasing redness at the site of your procedure. °Get help right away if: °· You have difficulty breathing. °· You have chest pain. °· You have unexpected bleeding. °· You feel that you are having a life-threatening or urgent problem. °This information is not intended to replace advice given to you by your health care provider. Make sure you discuss any questions you have with your health care provider. °Document Released: 10/04/2000 Document Revised: 12/01/2015 Document Reviewed: 06/12/2015 °Elsevier Interactive Patient Education © 2018 Elsevier Inc. ° °

## 2018-05-15 ENCOUNTER — Ambulatory Visit
Admission: RE | Disposition: A | Discharge status: Home / Self Care | Payer: Self-pay | Source: Ambulatory Visit | Attending: Gastroenterology

## 2018-05-15 ENCOUNTER — Ambulatory Visit: Payer: Medicare Other | Admitting: Anesthesiology

## 2018-05-15 ENCOUNTER — Other Ambulatory Visit: Payer: Self-pay

## 2018-05-15 ENCOUNTER — Ambulatory Visit
Admission: RE | Admit: 2018-05-15 | Discharge: 2018-05-15 | Disposition: A | Discharge status: Home / Self Care | Payer: Medicare Other | Source: Ambulatory Visit | Attending: Gastroenterology | Admitting: Gastroenterology

## 2018-05-15 ENCOUNTER — Telehealth: Payer: Self-pay | Admitting: Obstetrics and Gynecology

## 2018-05-15 DIAGNOSIS — Z87891 Personal history of nicotine dependence: Secondary | ICD-10-CM | POA: Insufficient documentation

## 2018-05-15 DIAGNOSIS — Z1211 Encounter for screening for malignant neoplasm of colon: Secondary | ICD-10-CM | POA: Diagnosis not present

## 2018-05-15 DIAGNOSIS — D125 Benign neoplasm of sigmoid colon: Secondary | ICD-10-CM

## 2018-05-15 DIAGNOSIS — Z7982 Long term (current) use of aspirin: Secondary | ICD-10-CM | POA: Diagnosis not present

## 2018-05-15 DIAGNOSIS — K641 Second degree hemorrhoids: Secondary | ICD-10-CM | POA: Insufficient documentation

## 2018-05-15 DIAGNOSIS — Z7984 Long term (current) use of oral hypoglycemic drugs: Secondary | ICD-10-CM | POA: Diagnosis not present

## 2018-05-15 DIAGNOSIS — K635 Polyp of colon: Secondary | ICD-10-CM

## 2018-05-15 DIAGNOSIS — D122 Benign neoplasm of ascending colon: Secondary | ICD-10-CM | POA: Diagnosis not present

## 2018-05-15 DIAGNOSIS — Z79899 Other long term (current) drug therapy: Secondary | ICD-10-CM | POA: Diagnosis not present

## 2018-05-15 DIAGNOSIS — Z860100 Personal history of colon polyps, unspecified: Secondary | ICD-10-CM

## 2018-05-15 DIAGNOSIS — E039 Hypothyroidism, unspecified: Secondary | ICD-10-CM | POA: Insufficient documentation

## 2018-05-15 DIAGNOSIS — J45909 Unspecified asthma, uncomplicated: Secondary | ICD-10-CM | POA: Diagnosis not present

## 2018-05-15 DIAGNOSIS — K219 Gastro-esophageal reflux disease without esophagitis: Secondary | ICD-10-CM | POA: Insufficient documentation

## 2018-05-15 DIAGNOSIS — E119 Type 2 diabetes mellitus without complications: Secondary | ICD-10-CM | POA: Diagnosis not present

## 2018-05-15 DIAGNOSIS — F419 Anxiety disorder, unspecified: Secondary | ICD-10-CM | POA: Insufficient documentation

## 2018-05-15 DIAGNOSIS — G4730 Sleep apnea, unspecified: Secondary | ICD-10-CM | POA: Insufficient documentation

## 2018-05-15 DIAGNOSIS — I10 Essential (primary) hypertension: Secondary | ICD-10-CM | POA: Diagnosis not present

## 2018-05-15 HISTORY — DX: Type 2 diabetes mellitus without complications: E11.9

## 2018-05-15 HISTORY — DX: Presence of dental prosthetic device (complete) (partial): Z97.2

## 2018-05-15 HISTORY — PX: POLYPECTOMY: SHX5525

## 2018-05-15 HISTORY — PX: COLONOSCOPY WITH PROPOFOL: SHX5780

## 2018-05-15 LAB — GLUCOSE, CAPILLARY: Glucose-Capillary: 115 mg/dL — ABNORMAL HIGH (ref 70–99)

## 2018-05-15 SURGERY — COLONOSCOPY WITH PROPOFOL
Anesthesia: General | Site: Rectum

## 2018-05-15 MED ORDER — NYSTATIN 100000 UNIT/GM EX POWD: Freq: Two times a day (BID) | CUTANEOUS | 0 refills | Status: AC

## 2018-05-15 MED ORDER — ONDANSETRON HCL 4 MG/2ML IJ SOLN: 4.0000 mg | Freq: Once | INTRAMUSCULAR | Status: DC | PRN

## 2018-05-15 MED ORDER — PROPOFOL 10 MG/ML IV BOLUS
INTRAVENOUS | Status: DC | PRN
  Administered 2018-05-15 (×2): 20 mg via INTRAVENOUS
  Administered 2018-05-15: 120 mg via INTRAVENOUS
  Administered 2018-05-15 (×2): 20 mg via INTRAVENOUS
  Administered 2018-05-15 (×2): 30 mg via INTRAVENOUS
  Administered 2018-05-15 (×2): 20 mg via INTRAVENOUS

## 2018-05-15 MED ORDER — LIDOCAINE HCL (CARDIAC) PF 100 MG/5ML IV SOSY
PREFILLED_SYRINGE | INTRAVENOUS | Status: DC | PRN
  Administered 2018-05-15: 20 mg via INTRAVENOUS

## 2018-05-15 MED ORDER — LACTATED RINGERS IV SOLN
INTRAVENOUS | Status: DC | PRN
  Administered 2018-05-15: 10:00:00 via INTRAVENOUS

## 2018-05-15 MED ORDER — STERILE WATER FOR IRRIGATION IR SOLN
Status: DC | PRN
  Administered 2018-05-15: 10:00:00

## 2018-05-15 MED ORDER — LACTATED RINGERS IV SOLN: INTRAVENOUS | Status: DC

## 2018-05-15 SURGICAL SUPPLY — 8 items
CANISTER SUCT 1200ML W/VALVE (MISCELLANEOUS) ×2 IMPLANT
FORCEPS BIOP RAD 4 LRG CAP 4 (CUTTING FORCEPS) ×1 IMPLANT
GOWN CVR UNV OPN BCK APRN NK (MISCELLANEOUS) ×2 IMPLANT
GOWN ISOL THUMB LOOP REG UNIV (MISCELLANEOUS) ×4
KIT ENDO PROCEDURE OLY (KITS) ×2 IMPLANT
SNARE SHORT THROW 13M SML OVAL (MISCELLANEOUS) ×1 IMPLANT
TRAP ETRAP POLY (MISCELLANEOUS) ×1 IMPLANT
WATER STERILE IRR 250ML POUR (IV SOLUTION) ×2 IMPLANT

## 2018-05-15 NOTE — H&P (Signed)
Erin Lame, MD [facility] [address] [place], [place] [address] Phone:[phone] Fax : [phone]  Primary Care Physician:  Erin Hauser, DO Primary Gastroenterologist:  Dr. Allen Potter  Pre-Procedure History & Physical: HPI:  Erin Potter is a 57 y.o. female is here for an colonoscopy.   Past Medical History:  Diagnosis Date  . Anxiety   . Asthma   . Diabetes mellitus, type 2 ([place])   . GERD (gastroesophageal reflux disease)   . Hypertension   . Hypothyroidism   . Neuromuscular disorder ([place])    LEFT ARM WEAKNESS-ULNAR NERVE ISSUE  . Shortness of breath dyspnea   . Sleep apnea    NO CPAP-ONLY FINISHED HALF TEST  . Wears dentures    full upper, partial lower    Past Surgical History:  Procedure Laterality Date  . BREAST BIOPSY Left 02/09/2017   neg  . COLONOSCOPY WITH PROPOFOL N/A 02/21/2015   Procedure: COLONOSCOPY WITH PROPOFOL;  Surgeon: Erin Lame, MD;  Location: [facility];  Service: Endoscopy;  Laterality: N/A;  . LEFT HEART CATH AND CORONARY ANGIOGRAPHY N/A 02/07/2017   Procedure: Left Heart Cath and Coronary Angiography;  Surgeon: Isaias Cowman, MD;  Location: [facility];  Service: Cardiovascular;  Laterality: N/A;  . LEG SURGERY Right   . NECK SURGERY     EXPLORATORY  . POLYPECTOMY  02/21/2015   Procedure: POLYPECTOMY;  Surgeon: Erin Lame, MD;  Location: [facility];  Service: Endoscopy;;  . TONSILLECTOMY    . TUBAL LIGATION    . TUBALPLASTY     LAPAROSCOPY    Prior to Admission medications   Medication Sig Start Date End Date Taking? Authorizing Provider  albuterol (PROAIR HFA) 108 (90 Base) MCG/ACT inhaler Inhale 2 puffs into the lungs every 6 (six) hours as needed for wheezing or shortness of breath. 09/14/17  Yes Karamalegos, Devonne Doughty, DO  amitriptyline (ELAVIL) 25 MG tablet Take 0.5 tablets (12.5 mg total) by mouth at bedtime. 09/14/17  Yes Karamalegos, Devonne Doughty, DO  aspirin EC 81 MG  tablet Take 81 mg by mouth at bedtime.   Yes [provider]  Aspirin-Salicylamide-Caffeine (BC HEADACHE PO) Take 2 packets by mouth daily as needed (pain).   Yes [provider]  baclofen (LIORESAL) 10 MG tablet Take 1 tablet in morning, 1 tablet in afternoon, and 2 tablets in evening 12/15/17  Yes Karamalegos, Devonne Doughty, DO  Black Pepper-Turmeric (TURMERIC COMPLEX/BLACK PEPPER) 3-500 MG CAPS Take by mouth.   Yes [provider]  Calcium Carbonate-Vit D-Min (CALCIUM 600+D PLUS MINERALS PO) Take 1 tablet by mouth daily.    Yes [provider]  chlorhexidine (PERIDEX) 0.12 % solution 5 mLs by Mouth Rinse route daily as needed (mouth sores).  09/30/16  Yes [provider]  citalopram (CELEXA) 40 MG tablet TAKE 1 TABLET BY MOUTH EVERY DAY 12/19/17  Yes Erin Hauser, DO  Cranberry 500 MG CAPS  11/17/17  Yes [provider]  DIFFERIN 0.1 % cream  08/24/17  Yes [provider]  esomeprazole (NEXIUM) 20 MG capsule Take 1 capsule (20 mg total) by mouth 2 (two) times daily before a meal. 05/08/18  Yes Karamalegos, Devonne Doughty, DO  fluoruracil (CARAC) 0.5 % cream Apply 0.5 mg topically daily as needed (skin spots).  12/05/15  Yes [provider]  furosemide (LASIX) 20 MG tablet Alternate taking 1, 20 mg Lasix with 2, 20 mg tabs, every other day 02/09/16  Yes Arlis Porta.,  MD  gabapentin (NEURONTIN) 400 MG capsule Take 1 capsule (400 mg total) by mouth 3 (three) times daily. 04/14/18  Yes Karamalegos, Devonne Doughty, DO  glucose blood (ACCU-CHEK SMARTVIEW) test strip Check blood sugar up to 3 x daily 02/02/18  Yes Karamalegos, Devonne Doughty, DO  ipratropium-albuterol (DUONEB) 0.5-2.5 (3) MG/3ML SOLN Take 3 mLs by nebulization every 2 (two) hours as needed. Patient taking differently: Take 3 mLs by nebulization every 2 (two) hours as needed (shortness of breath).  01/07/16  Yes Arlis Porta., MD  ketoconazole (NIZORAL) 2 %  shampoo Apply 1 application topically 3 (three) times a week.  12/21/16  Yes [provider]  Lancets (ACCU-CHEK SOFT TOUCH) lancets Check blood sugar up to 3 x daily 02/02/18  Yes Karamalegos, Devonne Doughty, DO  levothyroxine (SYNTHROID, LEVOTHROID) 175 MCG tablet TAKE 1 TABLET (175 MCG TOTAL) BY MOUTH DAILY BEFORE BREAKFAST. 02/02/18  Yes Karamalegos, Devonne Doughty, DO  metFORMIN (GLUCOPHAGE) 500 MG tablet Take 1 tablet (500 mg total) by mouth 2 (two) times daily with a meal. 09/14/17  Yes Karamalegos, Alexander J, DO  Milk Thistle 500 MG CAPS Take 500 mg by mouth 2 (two) times daily.    Yes [provider]  Multiple Vitamin (MULTIVITAMIN) tablet Take 1 tablet by mouth daily.   Yes [provider]  Probiotic Product (PRO-BIOTIC BLEND PO) Take 1 capsule by mouth daily.   Yes [provider]  SYMBICORT 160-4.5 MCG/ACT inhaler TAKE 2 PUFFS BY MOUTH TWICE A DAY 05/04/18  Yes Karamalegos, Devonne Doughty, DO  tiotropium (SPIRIVA HANDIHALER) 18 MCG inhalation capsule inhale the contents of one capsule in the handihaler once daily IN THE MORNING 09/14/17  Yes Karamalegos, Devonne Doughty, DO  ferrous sulfate 325 (65 FE) MG tablet Take 325 mg by mouth daily.     [provider]  fluocinonide (LIDEX) 0.05 % external solution Apply 1 application topically daily as needed for irritation. 01/17/17   [provider]  meloxicam (MOBIC) 15 MG tablet Take 1 tablet (15 mg total) by mouth daily. Patient not taking: Reported on 05/03/2018 09/14/17   Erin Hauser, DO    Allergies as of 05/02/2018 - Review Complete 03/07/2018  Allergen Reaction Noted  . Clindamycin Hives 12/30/2014  . Pseudoeph-doxylamine-dm-apap Hives 11/11/2014  . 2,4-d dimethylamine (amisol) Other (See Comments) 12/30/2014  . Clindamycin hcl  05/28/2011    Family History  Problem Relation Age of Onset  . Hypertension Mother   . Thyroid disease Mother   . Heart disease Father   . Diabetes Father    . Stroke Father   . Heart attack Father   . Hypertension Maternal Grandmother   . Hypertension Maternal Grandfather   . Heart attack Maternal Grandfather   . Cancer Paternal Grandmother   . Alzheimer's disease Paternal Grandmother   . Breast cancer Neg Hx     Social History   Socioeconomic History  . Marital status: Widowed    Spouse name: Not on file  . Number of children: Not on file  . Years of education: Not on file  . Highest education level: Not on file  Occupational History  . Not on file  Social Needs  . Financial resource strain: Not on file  . Food insecurity:    Worry: Not on file    Inability: Not on file  . Transportation needs:    Medical: Not on file    Non-medical: Not on file  Tobacco Use  . Smoking status: Former Smoker  Packs/day: 1.50    Years: 35.00    Pack years: 52.50    Types: Cigarettes    Last attempt to quit: 02/24/2014    Years since quitting: 4.2  . Smokeless tobacco: Former Network engineer and Sexual Activity  . Alcohol use: No    Alcohol/week: 0.0 standard drinks  . Drug use: No  . Sexual activity: Not Currently    Birth control/protection: Post-menopausal  Lifestyle  . Physical activity:    Days per week: Not on file    Minutes per session: Not on file  . Stress: Not on file  Relationships  . Social connections:    Talks on phone: Not on file    Gets together: Not on file    Attends religious service: Not on file    Active member of club or organization: Not on file    Attends meetings of clubs or organizations: Not on file    Relationship status: Not on file  . Intimate partner violence:    Fear of current or ex partner: Not on file    Emotionally abused: Not on file    Physically abused: Not on file    Forced sexual activity: Not on file  Other Topics Concern  . Not on file  Social History Narrative  . Not on file    Review of Systems: See HPI, otherwise negative ROS  Physical Exam: Ht 5' 3.5" (1.613 m)   Wt  96.2 kg   BMI 36.97 kg/m  General:   Alert,  pleasant and cooperative in NAD Head:  Normocephalic and atraumatic. Neck:  Supple; no masses or thyromegaly. Lungs:  Clear throughout to auscultation.    Heart:  Regular rate and rhythm. Abdomen:  Soft, nontender and nondistended. Normal bowel sounds, without guarding, and without rebound.   Neurologic:  Alert and  oriented x4;  grossly normal neurologically.  Impression/Plan: Riot Barrick is here for an colonoscopy to be performed for history of polyps 02/2015  Risks, benefits, limitations, and alternatives regarding  colonoscopy have been reviewed with the patient.  Questions have been answered.  All parties agreeable.   Erin Lame, MD  05/15/2018, 9:11 AM

## 2018-05-15 NOTE — Anesthesia Preprocedure Evaluation (Addendum)
Anesthesia Evaluation  Patient identified by MRN, date of birth, ID band Patient awake    Reviewed: Allergy & Precautions, NPO status , Patient's Chart, lab work & pertinent test results, reviewed documented beta blocker date and time   Airway Mallampati: II  TM Distance: >3 FB Neck ROM: Full    Dental  (+) Upper Dentures, Lower Dentures   Pulmonary shortness of breath, asthma , sleep apnea , COPD, former smoker,    Pulmonary exam normal breath sounds clear to auscultation       Cardiovascular hypertension, Normal cardiovascular exam Rhythm:Regular Rate:Normal     Neuro/Psych Anxiety Depression    GI/Hepatic GERD  ,  Endo/Other  diabetesHypothyroidism   Renal/GU      Musculoskeletal  (+) Arthritis ,   Abdominal (+) + obese,   Peds  Hematology negative hematology ROS (+)   Anesthesia Other Findings   Reproductive/Obstetrics                           Anesthesia Physical Anesthesia Plan  ASA: III  Anesthesia Plan: General   Post-op Pain Management:    Induction: Intravenous  PONV Risk Score and Plan:   Airway Management Planned: Natural Airway  Additional Equipment: None  Intra-op Plan:   Post-operative Plan:   Informed Consent: I have reviewed the patients History and Physical, chart, labs and discussed the procedure including the risks, benefits and alternatives for the proposed anesthesia with the patient or authorized representative who has indicated his/her understanding and acceptance.     Plan Discussed with: CRNA, Anesthesiologist and Surgeon  Anesthesia Plan Comments:         Anesthesia Quick Evaluation

## 2018-05-15 NOTE — Op Note (Signed)
[facility] Patient Name: Erin Potter Procedure Date: 05/15/2018 9:33 AM MRN: [id number] Account #: [id number] Date of Birth: 1960/09/01 Admit Type: Outpatient Age: 57 Room: [facility] OR ROOM 01 Gender: Female Note Status: Finalized Procedure:            Colonoscopy Indications:          High risk colon cancer surveillance: Personal history                        of colonic polyps 02/2015 Providers:            [name] MD, MD Referring MD:         [name] (Referring MD) Medicines:            Propofol per Anesthesia Complications:        No immediate complications. Procedure:            Pre-Anesthesia Assessment:                       - Prior to the procedure, a History and Physical was                        performed, and patient medications and allergies were                        reviewed. The patient's tolerance of previous                        anesthesia was also reviewed. The risks and benefits of                        the procedure and the sedation options and risks were                        discussed with the patient. All questions were                        answered, and informed consent was obtained. Prior                        Anticoagulants: The patient has taken no previous                        anticoagulant or antiplatelet agents. ASA Grade                        Assessment: II - A patient with mild systemic disease.                        After reviewing the risks and benefits, the patient was                        deemed in satisfactory condition to undergo the                        procedure.                       After obtaining informed consent, the colonoscope was  passed under direct vision. Throughout the procedure,                        the patient's blood pressure, pulse, and oxygen                        saturations were monitored continuously. The was          introduced through the anus and advanced to the the                        cecum, identified by appendiceal orifice and ileocecal                        valve. The colonoscopy was performed without                        difficulty. The patient tolerated the procedure well.                        The quality of the bowel preparation was excellent. Findings:      The perianal and digital rectal examinations were normal.      Three sessile polyps were found in the sigmoid colon. The polyps were 2       to 3 mm in size. These polyps were removed with a cold biopsy forceps.       Resection and retrieval were complete.      A 8 mm polyp was found in the ascending colon. The polyp was sessile.       The polyp was removed with a cold snare. Resection and retrieval were       complete.      Non-bleeding internal hemorrhoids were found during retroflexion. The       hemorrhoids were Grade II (internal hemorrhoids that prolapse but reduce       spontaneously). Impression:           - Three 2 to 3 mm polyps in the sigmoid colon, removed                        with a cold biopsy forceps. Resected and retrieved.                       - One 8 mm polyp in the ascending colon, removed with a                        cold snare. Resected and retrieved.                       - Non-bleeding internal hemorrhoids. Recommendation:       - Discharge patient to home.                       - Resume previous diet.                       - Continue present medications.                       - Await pathology results.                       -  Repeat colonoscopy in 5 years for surveillance. Procedure Code(s):    --- Professional ---                       [phone], Colonoscopy, flexible; with removal of tumor(s),                        polyp(s), or other lesion(s) by snare technique                       [id number], 67, Colonoscopy, flexible; with biopsy, single                        or multiple Diagnosis Code(s):     --- Professional ---                       Z86.010, Personal history of colonic polyps                       D12.5, Benign neoplasm of sigmoid colon                       D12.2, Benign neoplasm of ascending colon CPT copyright 2018 American Medical Association. All rights reserved. The codes documented in this report are preliminary and upon coder review may  be revised to meet current compliance requirements. [name] MD, MD 05/15/2018 10:02:48 AM This report has been signed electronically. Number of Addenda: 0 Note Initiated On: 05/15/2018 9:33 AM Scope Withdrawal Time: 0 hours 11 minutes 56 seconds  Total Procedure Duration: 0 hours 15 minutes 22 seconds       [facility]

## 2018-05-15 NOTE — Anesthesia Postprocedure Evaluation (Signed)
Anesthesia Post Note  Patient: Erin Potter  Procedure(s) Performed: COLONOSCOPY WITH BIOPSIES (N/A Rectum) POLYPECTOMY (N/A Rectum)  Patient location during evaluation: PACU Anesthesia Type: General Level of consciousness: awake Pain management: pain level controlled Vital Signs Assessment: post-procedure vital signs reviewed and stable Respiratory status: spontaneous breathing Cardiovascular status: blood pressure returned to baseline Postop Assessment: no headache Anesthetic complications: no    Lavonna Monarch

## 2018-05-15 NOTE — Anesthesia Procedure Notes (Signed)
Date/Time: 05/15/2018 9:41 AM Performed by: Janna Arch, CRNA Pre-anesthesia Checklist: Patient identified, Emergency Drugs available, Suction available, Timeout performed and Patient being monitored Patient Re-evaluated:Patient Re-evaluated prior to induction Oxygen Delivery Method: Nasal cannula Placement Confirmation: positive ETCO2

## 2018-05-15 NOTE — Telephone Encounter (Signed)
The [facility] called about this patient's recent prescription for Nystatin and that it had not been received so the patient could pick it up; they are requesting this script be sent over today if possible, please advise, thanks.

## 2018-05-15 NOTE — Transfer of Care (Signed)
Immediate Anesthesia Transfer of Care Note  Patient: Erin Potter  Procedure(s) Performed: COLONOSCOPY WITH BIOPSIES (N/A Rectum) POLYPECTOMY (N/A Rectum)  Patient Location: PACU  Anesthesia Type: General  Level of Consciousness: awake, alert  and patient cooperative  Airway and Oxygen Therapy: Patient Spontanous Breathing and Patient connected to supplemental oxygen  Post-op Assessment: Post-op Vital signs reviewed, Patient's Cardiovascular Status Stable, Respiratory Function Stable, Patent Airway and No signs of Nausea or vomiting  Post-op Vital Signs: Reviewed and stable  Complications: No apparent anesthesia complications

## 2018-05-16 ENCOUNTER — Telehealth: Payer: Self-pay | Admitting: Family Medicine

## 2018-05-16 ENCOUNTER — Encounter: Payer: Self-pay | Admitting: Gastroenterology

## 2018-05-16 DIAGNOSIS — L219 Seborrheic dermatitis, unspecified: Secondary | ICD-10-CM

## 2018-05-16 NOTE — Telephone Encounter (Signed)
Pt needs a referral to [facility].

## 2018-05-16 NOTE — Telephone Encounter (Signed)
Referral placed to [facility]. LIkely for seborrheic dermatitis of scalp.  Nobie Putnam, DO [facility] 05/16/2018, 5:04 PM

## 2018-05-17 ENCOUNTER — Encounter: Payer: Self-pay | Admitting: Gastroenterology

## 2018-05-17 DIAGNOSIS — J449 Chronic obstructive pulmonary disease, unspecified: Secondary | ICD-10-CM | POA: Diagnosis not present

## 2018-05-17 DIAGNOSIS — I10 Essential (primary) hypertension: Secondary | ICD-10-CM | POA: Diagnosis not present

## 2018-05-17 DIAGNOSIS — H538 Other visual disturbances: Secondary | ICD-10-CM | POA: Diagnosis not present

## 2018-05-19 DIAGNOSIS — M5442 Lumbago with sciatica, left side: Secondary | ICD-10-CM | POA: Diagnosis not present

## 2018-05-19 DIAGNOSIS — G8929 Other chronic pain: Secondary | ICD-10-CM | POA: Diagnosis not present

## 2018-05-29 ENCOUNTER — Encounter: Payer: Self-pay | Admitting: Podiatry

## 2018-05-29 ENCOUNTER — Other Ambulatory Visit: Payer: Self-pay | Admitting: Family Medicine

## 2018-05-29 ENCOUNTER — Ambulatory Visit (INDEPENDENT_AMBULATORY_CARE_PROVIDER_SITE_OTHER): Payer: Medicare Other | Admitting: Podiatry

## 2018-05-29 ENCOUNTER — Encounter

## 2018-05-29 VITALS — BP 148/85 | HR 82

## 2018-05-29 DIAGNOSIS — M79675 Pain in left toe(s): Secondary | ICD-10-CM

## 2018-05-29 DIAGNOSIS — M79674 Pain in right toe(s): Secondary | ICD-10-CM | POA: Diagnosis not present

## 2018-05-29 DIAGNOSIS — E1165 Type 2 diabetes mellitus with hyperglycemia: Principal | ICD-10-CM

## 2018-05-29 DIAGNOSIS — E1140 Type 2 diabetes mellitus with diabetic neuropathy, unspecified: Secondary | ICD-10-CM

## 2018-05-29 DIAGNOSIS — IMO0002 Reserved for concepts with insufficient information to code with codable children: Secondary | ICD-10-CM

## 2018-05-29 DIAGNOSIS — B351 Tinea unguium: Secondary | ICD-10-CM | POA: Diagnosis not present

## 2018-05-29 NOTE — Progress Notes (Signed)
This patient presents the office for an evaluation and treatment of her big toenails both feet.  She says that she believes she has a fungus on her left big toe that she has been trimming and thinning herself.  She also says she believes she has a fungus which is causing marketed incurvation along the inside and outside borders of the right big toe.  She denies any drainage redness or infection at the 2 borders.  This patient has had ankle surgery and has a very thickened prominent right ankle.  Her ankle surgery was performed in December 2011.  She also says that she has posttraumatic stress syndrome.  Patient has been diagnosed as a diabetic with neuropathy.  She presents the office today to discuss treatment of her toenails.  General Appearance  Alert, conversant and in no acute stress.  Vascular  Dorsalis pedis and posterior tibial  pulses are palpable  bilaterally.  Capillary return is within normal limits  bilaterally. Temperature is within normal limits  bilaterally.  Neurologic  Senn-Weinstein monofilament wire test within normal limits  bilaterally. Muscle power within normal limits bilaterally.  Nails Thick disfigured discolored nails with subungual debris  from hallux  toes bilaterally. No evidence of bacterial infection or drainage bilaterally.  There is yellow disfigured discolored nail plate left hallux.  Patient has a thickened nail plate right hallux with marked incurvation noted along the medial and lateral borders.  Orthopedic  No limitations of motion  feet .  No crepitus or effusions noted.  No bony pathology or digital deformities noted.  Skin  normotropic skin with no porokeratosis noted bilaterally.  No signs of infections or ulcers noted.    Onychomycosis  Hallux  B/L.  IE.  Discussed this condition with this patient discussed possible surgery for the correction of the medial and lateral borders of the right hallux nail plate.  Discussed the treatment for fungal infection on  the left hallux.  We discussed laser treatment as well as Lamisil treatment.  We also discussed application of an over-the-counter medication from the office but she declined to take this medication at home.  She is to return to the office as needed for further evaluation and treatment   Gardiner Barefoot DPM

## 2018-05-30 DIAGNOSIS — I6523 Occlusion and stenosis of bilateral carotid arteries: Secondary | ICD-10-CM | POA: Diagnosis not present

## 2018-05-30 DIAGNOSIS — I10 Essential (primary) hypertension: Secondary | ICD-10-CM | POA: Diagnosis not present

## 2018-05-30 DIAGNOSIS — H538 Other visual disturbances: Secondary | ICD-10-CM | POA: Diagnosis not present

## 2018-05-31 ENCOUNTER — Other Ambulatory Visit: Payer: Self-pay | Admitting: Family Medicine

## 2018-05-31 DIAGNOSIS — F418 Other specified anxiety disorders: Secondary | ICD-10-CM

## 2018-06-27 IMAGING — MG US BREAST BX W LOC DEV 1ST LESION IMG BX SPEC US GUIDE*L*
1 series · 8 of 8 positions shown · non-contrast
Comparison: Previous exam(s).

ADDENDUM:
Pathology of the left breast biopsy revealed A. LEFT BREAST, [DATE], 1
CMFN; BIOPSY: CYSTIC APOCRINE METAPLASIA. COLUMNAR CELL CHANGE AND
FOCAL PSEUDO-ANGIOMATOUS STROMAL HYPERPLASIA. This was found to be
concordant with Dr. Vidich impression and notes.

Recommendation: Six month follow-up diagnostic mammogram left
breast.
At the patient's request, results and recommendations were relayed
to the patient by phone by Krissie, Oleak on 02/10/17 at [DATE].
The patient stated she has done well following the biopsy with no
bleeding, bruising, or hematoma. Post biopsy instructions were
reviewed with the patient. All of her questions were answered. She
was encouraged to call the [HOSPITAL] with any further
questions or concerns.
Addendum by Krissie, Oleak on 02/10/17.
CLINICAL DATA: 56-year-old female with indeterminate left breast
mass
EXAM:
ULTRASOUND GUIDED LEFT BREAST CORE NEEDLE BIOPSY

[Series 1: MG view · 0.06mm/px · 8 of 8 slices shown]
[im 1/8]
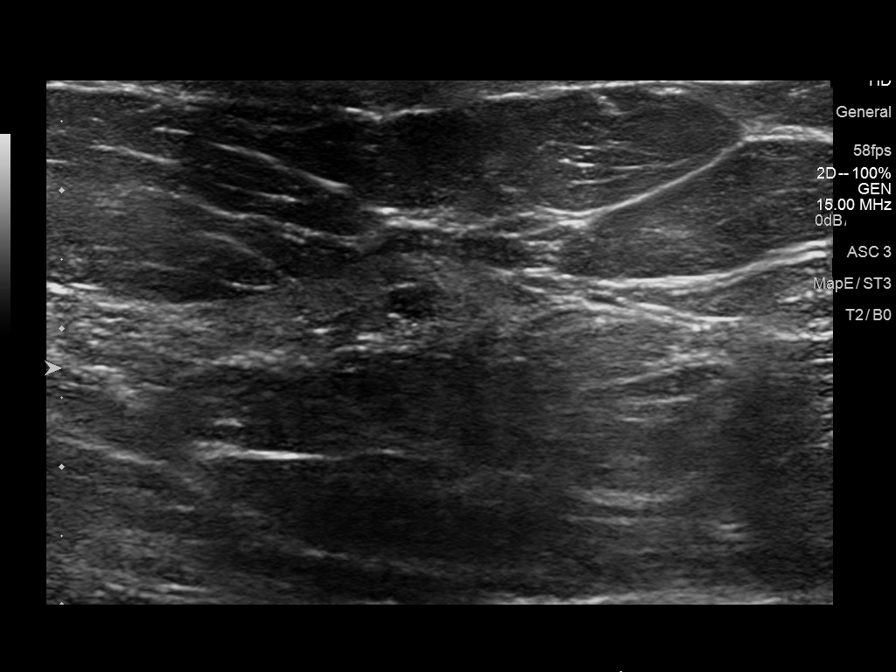
[im 2/8]
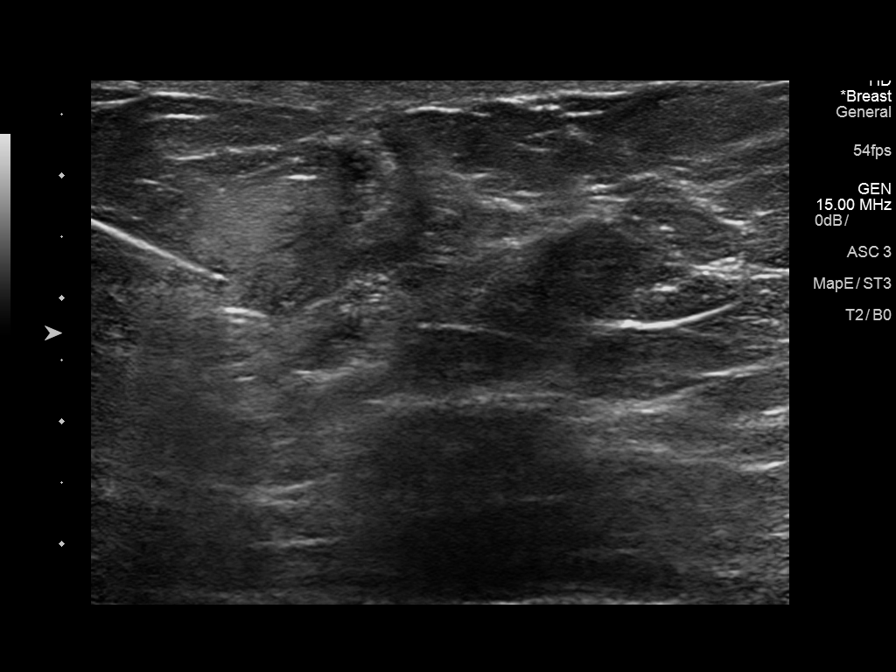
[im 3/8]
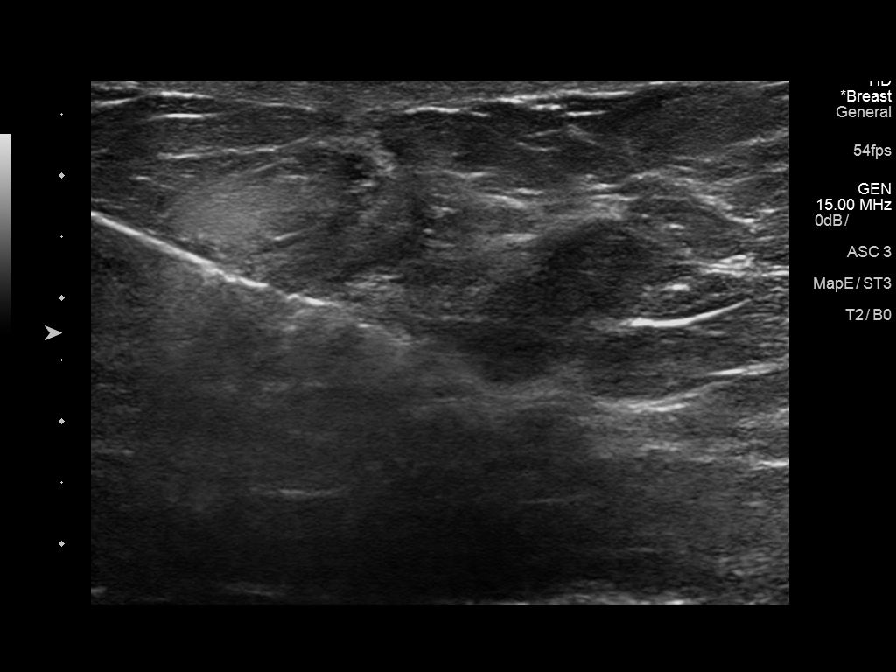
[im 4/8]
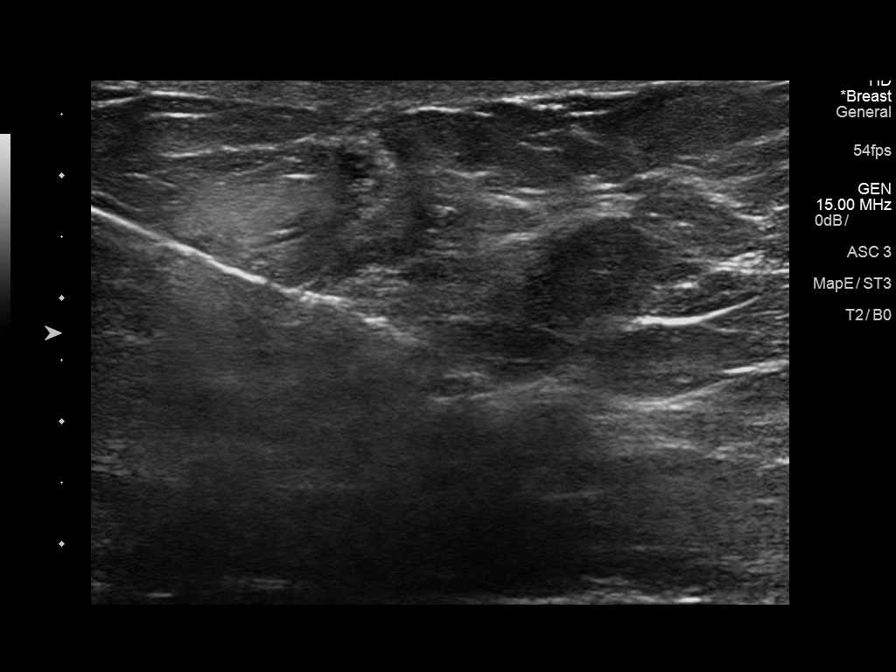
[im 5/8]
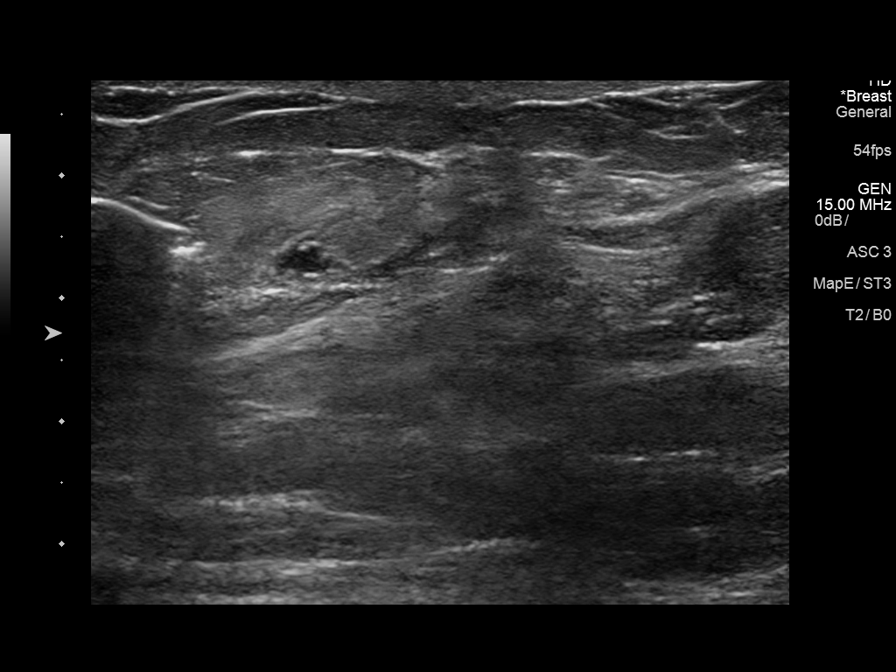
[im 6/8]
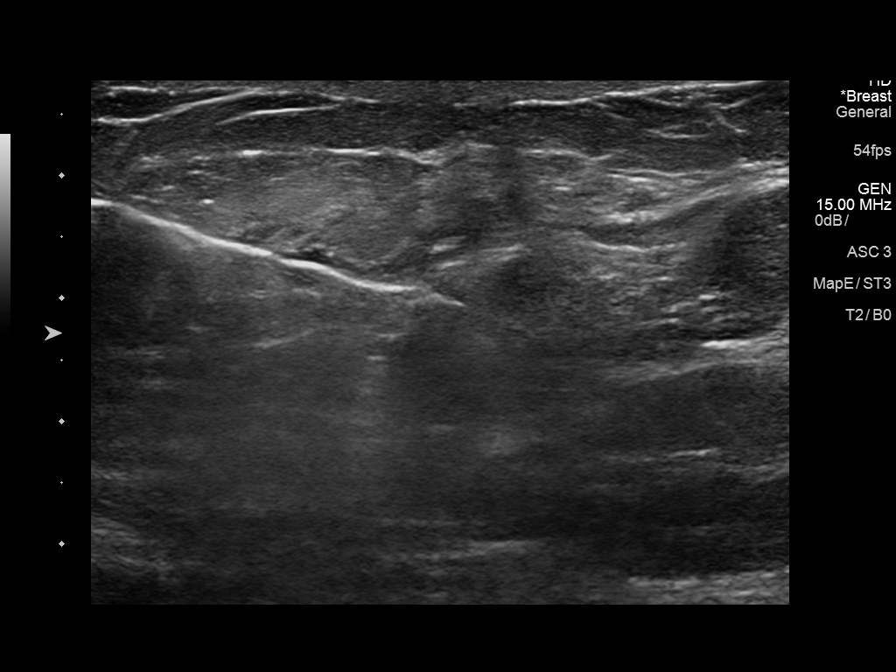
[im 7/8]
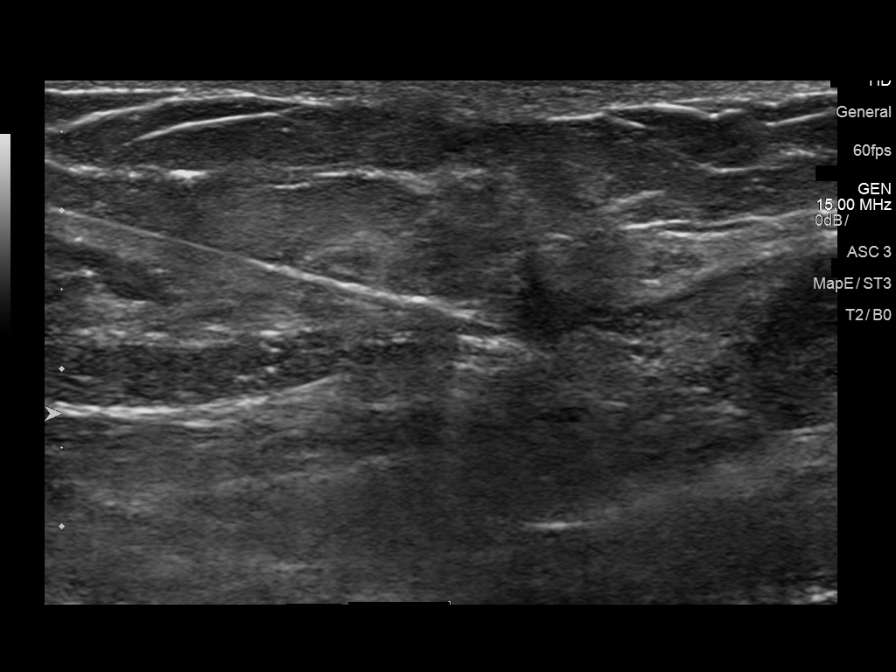
[im 8/8]
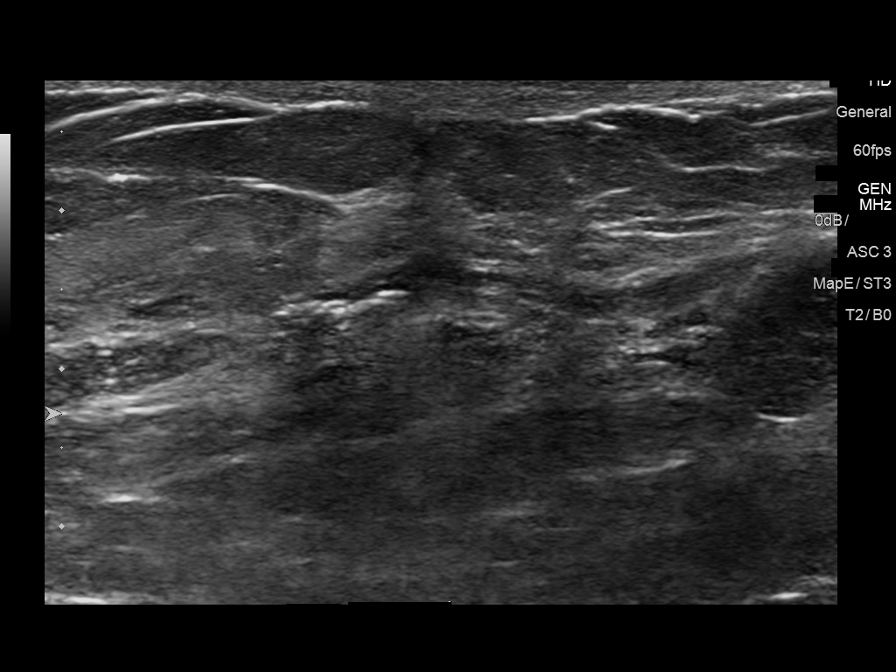

[8 of 8 positions shown; findings below may reference images not displayed]



Lesion quadrant: Lower outer quadrant

Using sterile technique and 1% Lidocaine as local anesthetic, under
direct ultrasound visualization, a 12 gauge Mathur device was
used to perform biopsy of an indeterminate left breast mass at 6
o'clock, 1 cm from the nipple using a medial to lateral approach.
The mass was not well seen after the initial biopsy pass and is
thought to have likely collapsed. At the conclusion of the procedure
a ribbon shaped tissue marker clip was deployed into the biopsy
cavity. Follow up 2 view mammogram was performed and dictated
separately.
IMPRESSION: Ultrasound guided biopsy of an indeterminate left breast mass. No
apparent complications.

## 2018-08-21 ENCOUNTER — Other Ambulatory Visit: Payer: Self-pay

## 2018-08-21 DIAGNOSIS — E1142 Type 2 diabetes mellitus with diabetic polyneuropathy: Secondary | ICD-10-CM

## 2018-08-21 MED ORDER — GLUCOSE BLOOD VI STRP: ORAL_STRIP | 12 refills | Status: DC

## 2018-08-21 MED ORDER — ACCU-CHEK SOFT TOUCH LANCETS MISC: 12 refills | Status: DC

## 2018-08-22 ENCOUNTER — Other Ambulatory Visit: Payer: Self-pay | Admitting: Family Medicine

## 2018-08-22 ENCOUNTER — Other Ambulatory Visit: Payer: Self-pay | Admitting: Obstetrics and Gynecology

## 2018-08-22 DIAGNOSIS — F411 Generalized anxiety disorder: Secondary | ICD-10-CM

## 2018-08-22 DIAGNOSIS — E034 Atrophy of thyroid (acquired): Secondary | ICD-10-CM

## 2018-08-22 DIAGNOSIS — J431 Panlobular emphysema: Secondary | ICD-10-CM

## 2018-08-22 DIAGNOSIS — F331 Major depressive disorder, recurrent, moderate: Secondary | ICD-10-CM

## 2018-09-18 ENCOUNTER — Telehealth: Payer: Self-pay | Admitting: Family Medicine

## 2018-09-18 NOTE — Telephone Encounter (Signed)
Called to schedule Medicare Annual Wellness Visit with the Nurse Health Advisor. Patient states she is out of town and will have to call back.  If patient returns call, please schedule her an AWV-I.  [place] [place] eligible 05/12/2018.  Thank you! For any questions please contact: Janace Hoard at [phone] or Skype [email or web address]

## 2018-11-10 ENCOUNTER — Other Ambulatory Visit: Payer: Self-pay | Admitting: Family Medicine

## 2018-11-10 DIAGNOSIS — J431 Panlobular emphysema: Secondary | ICD-10-CM

## 2018-11-21 ENCOUNTER — Other Ambulatory Visit: Payer: Self-pay | Admitting: Family Medicine

## 2018-11-21 DIAGNOSIS — J431 Panlobular emphysema: Secondary | ICD-10-CM

## 2018-11-21 DIAGNOSIS — M545 Low back pain, unspecified: Secondary | ICD-10-CM

## 2018-11-21 DIAGNOSIS — IMO0002 Reserved for concepts with insufficient information to code with codable children: Secondary | ICD-10-CM

## 2018-11-21 DIAGNOSIS — M79605 Pain in left leg: Secondary | ICD-10-CM

## 2018-11-21 DIAGNOSIS — G629 Polyneuropathy, unspecified: Secondary | ICD-10-CM

## 2018-11-21 DIAGNOSIS — G8929 Other chronic pain: Secondary | ICD-10-CM

## 2018-11-21 DIAGNOSIS — E1140 Type 2 diabetes mellitus with diabetic neuropathy, unspecified: Secondary | ICD-10-CM

## 2018-11-23 ENCOUNTER — Telehealth: Payer: Self-pay | Admitting: Family Medicine

## 2018-11-23 NOTE — Telephone Encounter (Signed)
Called patient regarding team health after hour phone call for refill request. Advised patient that we don't Rx meds for patient who moved from [facility] but as per [facility] she is doing back and forth and right now she is in [place]. Advised her that her refill is refused and if she needs refill then she will need to schedule an appointment. She will call to schedule a virtual appointment.

## 2018-12-06 ENCOUNTER — Other Ambulatory Visit: Payer: Self-pay

## 2018-12-06 ENCOUNTER — Ambulatory Visit: Payer: Medicare Other | Admitting: Family Medicine

## 2018-12-06 ENCOUNTER — Other Ambulatory Visit: Payer: Self-pay | Admitting: Family Medicine

## 2018-12-06 ENCOUNTER — Ambulatory Visit (INDEPENDENT_AMBULATORY_CARE_PROVIDER_SITE_OTHER): Payer: Medicare Other | Admitting: Family Medicine

## 2018-12-06 ENCOUNTER — Encounter: Payer: Self-pay | Admitting: Family Medicine

## 2018-12-06 DIAGNOSIS — E669 Obesity, unspecified: Secondary | ICD-10-CM

## 2018-12-06 DIAGNOSIS — I10 Essential (primary) hypertension: Secondary | ICD-10-CM

## 2018-12-06 DIAGNOSIS — M542 Cervicalgia: Secondary | ICD-10-CM | POA: Diagnosis not present

## 2018-12-06 DIAGNOSIS — F331 Major depressive disorder, recurrent, moderate: Secondary | ICD-10-CM

## 2018-12-06 DIAGNOSIS — K219 Gastro-esophageal reflux disease without esophagitis: Secondary | ICD-10-CM | POA: Diagnosis not present

## 2018-12-06 DIAGNOSIS — B372 Candidiasis of skin and nail: Secondary | ICD-10-CM

## 2018-12-06 DIAGNOSIS — M545 Low back pain, unspecified: Secondary | ICD-10-CM

## 2018-12-06 DIAGNOSIS — F3341 Major depressive disorder, recurrent, in partial remission: Secondary | ICD-10-CM

## 2018-12-06 DIAGNOSIS — L219 Seborrheic dermatitis, unspecified: Secondary | ICD-10-CM | POA: Diagnosis not present

## 2018-12-06 DIAGNOSIS — J432 Centrilobular emphysema: Secondary | ICD-10-CM

## 2018-12-06 DIAGNOSIS — F411 Generalized anxiety disorder: Secondary | ICD-10-CM

## 2018-12-06 DIAGNOSIS — M79605 Pain in left leg: Secondary | ICD-10-CM

## 2018-12-06 DIAGNOSIS — G8929 Other chronic pain: Secondary | ICD-10-CM

## 2018-12-06 DIAGNOSIS — R609 Edema, unspecified: Secondary | ICD-10-CM

## 2018-12-06 DIAGNOSIS — E1140 Type 2 diabetes mellitus with diabetic neuropathy, unspecified: Secondary | ICD-10-CM | POA: Diagnosis not present

## 2018-12-06 DIAGNOSIS — E1142 Type 2 diabetes mellitus with diabetic polyneuropathy: Secondary | ICD-10-CM

## 2018-12-06 DIAGNOSIS — E1165 Type 2 diabetes mellitus with hyperglycemia: Secondary | ICD-10-CM

## 2018-12-06 DIAGNOSIS — G629 Polyneuropathy, unspecified: Secondary | ICD-10-CM

## 2018-12-06 DIAGNOSIS — E034 Atrophy of thyroid (acquired): Secondary | ICD-10-CM

## 2018-12-06 DIAGNOSIS — E785 Hyperlipidemia, unspecified: Secondary | ICD-10-CM

## 2018-12-06 DIAGNOSIS — IMO0002 Reserved for concepts with insufficient information to code with codable children: Secondary | ICD-10-CM

## 2018-12-06 MED ORDER — BACLOFEN 10 MG PO TABS: ORAL_TABLET | ORAL | 1 refills | Status: DC

## 2018-12-06 MED ORDER — BUDESONIDE-FORMOTEROL FUMARATE 160-4.5 MCG/ACT IN AERO: INHALATION_SPRAY | RESPIRATORY_TRACT | 1 refills | Status: DC

## 2018-12-06 MED ORDER — GLUCOSE BLOOD VI STRP: ORAL_STRIP | 3 refills | Status: AC

## 2018-12-06 MED ORDER — ACCU-CHEK GUIDE ME W/DEVICE KIT: 1.0000 | PACK | Freq: Every day | 0 refills | Status: AC

## 2018-12-06 MED ORDER — IPRATROPIUM-ALBUTEROL 0.5-2.5 (3) MG/3ML IN SOLN: 3.0000 mL | RESPIRATORY_TRACT | 1 refills | Status: DC | PRN

## 2018-12-06 MED ORDER — LEVOTHYROXINE SODIUM 175 MCG PO TABS: 175.0000 ug | ORAL_TABLET | Freq: Every day | ORAL | 1 refills | Status: DC

## 2018-12-06 MED ORDER — NYSTATIN 100000 UNIT/GM EX POWD: Freq: Three times a day (TID) | CUTANEOUS | 1 refills | Status: DC | PRN

## 2018-12-06 MED ORDER — MELOXICAM 15 MG PO TABS: 15.0000 mg | ORAL_TABLET | ORAL | 1 refills | Status: DC | PRN

## 2018-12-06 MED ORDER — ESOMEPRAZOLE MAGNESIUM 40 MG PO CPDR: 40.0000 mg | DELAYED_RELEASE_CAPSULE | Freq: Every day | ORAL | 1 refills | Status: DC

## 2018-12-06 MED ORDER — CITALOPRAM HYDROBROMIDE 40 MG PO TABS: 40.0000 mg | ORAL_TABLET | Freq: Every day | ORAL | 1 refills | Status: DC

## 2018-12-06 MED ORDER — METFORMIN HCL 500 MG PO TABS: 500.0000 mg | ORAL_TABLET | Freq: Two times a day (BID) | ORAL | 1 refills | Status: DC

## 2018-12-06 MED ORDER — AMITRIPTYLINE HCL 25 MG PO TABS: 12.5000 mg | ORAL_TABLET | Freq: Every day | ORAL | 1 refills | Status: AC

## 2018-12-06 MED ORDER — GABAPENTIN 400 MG PO CAPS: 400.0000 mg | ORAL_CAPSULE | Freq: Three times a day (TID) | ORAL | 1 refills | Status: DC

## 2018-12-06 MED ORDER — VENTOLIN HFA 108 (90 BASE) MCG/ACT IN AERS: INHALATION_SPRAY | RESPIRATORY_TRACT | 1 refills | Status: DC

## 2018-12-06 MED ORDER — ACCU-CHEK SOFT TOUCH LANCETS MISC: 3 refills | Status: AC

## 2018-12-06 MED ORDER — TIOTROPIUM BROMIDE MONOHYDRATE 18 MCG IN CAPS: 18.0000 ug | ORAL_CAPSULE | Freq: Every day | RESPIRATORY_TRACT | 1 refills | Status: DC

## 2018-12-06 NOTE — Progress Notes (Signed)
Virtual Visit via Telephone The purpose of this virtual visit is to provide medical care while limiting exposure to the novel coronavirus (COVID19) for both patient and office staff.  Consent was obtained for phone visit:  Yes.   Answered questions that patient had about telehealth interaction:  Yes.   I discussed the limitations, risks, security and privacy concerns of performing an evaluation and management service by telephone. I also discussed with the patient that there may be a patient responsible charge related to this service. The patient expressed understanding and agreed to proceed.  Patient Location: Home Provider Location: [facility])  ---------------------------------------------------------------------- Chief Complaint  Patient presents with  . Diabetes    S: Reviewed CMA documentation. I have called patient and gathered additional HPI as follows:  CHRONIC DM, Type 2: Reports no new concerns, due for A1c will return soon CBGs: No cbg readings available today Meds: Metformin 55m BID Reports good compliance. Tolerating well w/o side-effects Currently not on  ACEi / ARB Lifestyle: - Diet (improving DM diet)  - Exercise (limited) Denies hypoglycemia, polyuria, visual changes, numbness or tingling.  RLS, Leg pain Reports chronic problem with episodic worsening leg restlessness and pain and discomfort. Taking NSAID Meloxicam, Gabapentin, TCA with some relief. Needs refills.  Centrilobular Emphysema (COPD) Due for nebulizer solution and maintenance therapy, symbicort, spiriva, ventolin Improved without flare up currently.  Hypothyroidism Due for lab check TSH. Last lab mild low TSH in 2019. Continues on Levothyroxine 1740m daily.  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 - Also history of candidal intertrigo requesting nystatin powder for PRN use  Denies any fevers, chills, sweats, body ache, cough, shortness of breath, sinus pain or pressure, headache, abdominal pain, diarrhea  Past Medical History:  Diagnosis Date  . Anxiety   . Asthma   . Diabetes mellitus, type 2 ([place]  . GERD (gastroesophageal reflux disease)   . Hypertension   . Hypothyroidism   . Neuromuscular disorder ([place]   LEFT ARM WEAKNESS-ULNAR NERVE ISSUE  . Shortness of breath dyspnea   . Sleep apnea    NO CPAP-ONLY FINISHED HALF TEST  . Wears dentures    full upper, partial lower   Social History   Tobacco Use  . Smoking status: Former Smoker    Packs/day: 1.50    Years: 35.00    Pack years: 52.50    Types: Cigarettes    Last attempt to quit: 02/24/2014    Years since quitting: 4.7  . Smokeless tobacco: Former [employer] Topics  . Alcohol use: No    Alcohol/week: 0.0 standard drinks  . Drug use: No    Current Outpatient Medications:  .  amitriptyline (ELAVIL) 25 MG tablet, Take 0.5 tablets (12.5 mg total) by mouth at bedtime., Disp: 45 tablet, Rfl: 1 .  aspirin EC 81 MG tablet, Take 81 mg by mouth at bedtime., Disp: , Rfl:  .  Aspirin-Salicylamide-Caffeine (BC HEADACHE PO), Take 2 packets by mouth daily as needed (pain)., Disp: , Rfl:  .  baclofen (LIORESAL) 10 MG tablet, Take 1 tablet in morning, 1 tablet in afternoon, and 2 tablets in evening, Disp:  360 tablet, Rfl: 1 .  Black Pepper-Turmeric (TURMERIC COMPLEX/BLACK PEPPER) 3-500 MG CAPS, Take by mouth., Disp: , Rfl:  .  budesonide-formoterol (SYMBICORT) 160-4.5 MCG/ACT inhaler, TAKE 2 PUFFS BY MOUTH TWICE A DAY, Disp: 30.6 Inhaler, Rfl: 1 .  Calcium Carbonate-Vit D-Min (CALCIUM 600+D PLUS MINERALS PO),  Take 1 tablet by mouth daily. , Disp: , Rfl:  .  chlorhexidine (PERIDEX) 0.12 % solution, 5 mLs by Mouth Rinse route daily as needed (mouth sores). , Disp: , Rfl: 0 .  citalopram (CELEXA) 40 MG tablet, Take 1 tablet (40 mg total) by mouth daily., Disp: 90 tablet, Rfl: 1 .  Cranberry 450 MG TABS, Take by mouth., Disp: , Rfl:  .  DIFFERIN 0.1 % cream, , Disp: , Rfl: 0 .  esomeprazole (NEXIUM) 40 MG capsule, Take 1 capsule (40 mg total) by mouth daily before breakfast., Disp: 90 capsule, Rfl: 1 .  fluocinonide (LIDEX) 0.05 % external solution, Apply 1 application topically daily as needed for irritation., Disp: , Rfl: 0 .  fluoruracil (CARAC) 0.5 % cream, Apply 0.5 mg topically daily as needed (skin spots). , Disp: , Rfl: 0 .  furosemide (LASIX) 20 MG tablet, Take 1 tablet (20 mg total) by mouth daily as needed. Alternate taking 1, 20 mg Lasix with 2, 20 mg tabs, every other day, Disp: 30 tablet, Rfl: 2 .  gabapentin (NEURONTIN) 400 MG capsule, Take 1 capsule (400 mg total) by mouth 3 (three) times daily., Disp: 270 capsule, Rfl: 1 .  ipratropium-albuterol (DUONEB) 0.5-2.5 (3) MG/3ML SOLN, Take 3 mLs by nebulization every 2 (two) hours as needed (shortness of breath)., Disp: 100 mL, Rfl: 1 .  ketoconazole (NIZORAL) 2 % shampoo, Apply 1 application topically 3 (three) times a week. , Disp: , Rfl: 0 .  levothyroxine (SYNTHROID) 175 MCG tablet, Take 1 tablet (175 mcg total) by mouth daily before breakfast., Disp: 90 tablet, Rfl: 1 .  meloxicam (MOBIC) 15 MG tablet, Take 1 tablet (15 mg total) by mouth as needed., Disp: 90 tablet, Rfl: 1 .  metFORMIN (GLUCOPHAGE) 500 MG tablet, Take 1 tablet (500 mg total) by mouth 2 (two) times daily with a meal., Disp: 180 tablet, Rfl: 1 .  Milk Thistle 500 MG CAPS, Take 500 mg by mouth 2 (two) times daily. , Disp: , Rfl:  .  Multiple Vitamin (MULTIVITAMIN) tablet, Take 1 tablet by mouth daily., Disp: , Rfl:  .  Probiotic Product (PRO-BIOTIC BLEND PO), Take 1 capsule by  mouth daily., Disp: , Rfl:  .  tiotropium (SPIRIVA) 18 MCG inhalation capsule, Place 1 capsule (18 mcg total) into inhaler and inhale daily., Disp: 90 capsule, Rfl: 1 .  TURMERIC PO, Take by mouth., Disp: , Rfl:  .  VENTOLIN HFA 108 (90 Base) MCG/ACT inhaler, INHALE 2 PUFFS BY MOUTH EVERY 6 HOURS AS NEEDED FOR WHEEZING/SHORTNESS OF BREATH, Disp: 18 g, Rfl: 1 .  Blood Glucose Monitoring Suppl (ACCU-CHEK GUIDE ME) w/Device KIT, 1 kit by Does not apply route daily. Check blood sugar up to x 2 daily as instructed, Disp: 1 kit, Rfl: 0 .  ferrous sulfate 325 (65 FE) MG tablet, Take 325 mg by mouth daily. , Disp: , Rfl:  .  glucose blood test strip, Check blood sugar up to 2 x daily as instructed, Disp: 100 each, Rfl: 3 .  Iodine, Kelp, (KELP PO), Take by mouth., Disp: , Rfl:  .  Lancets (ACCU-CHEK SOFT TOUCH) lancets, Check blood sugar up to 2 x daily as instructed,  Disp: 100 each, Rfl: 3 .  nystatin (MYCOSTATIN/NYSTOP) powder, Apply topically 3 (three) times daily as needed., Disp: 60 g, Rfl: 1  Depression screen [facility] 2/9 12/06/2018 03/07/2018 01/14/2017  Decreased Interest _0 Down, Depressed, Hopeless 0 3 1  PHQ - 2 Score _1 Altered sleeping 0 3 2  Tired, decreased energy 0 3 2  Change in appetite 0 0 0  Feeling bad or failure about yourself  0 0 0  Trouble concentrating 0 0 0  Moving slowly or fidgety/restless 0 0 0  Suicidal thoughts 0 0 0  PHQ-9 Score _2 Difficult doing work/chores Not difficult at all Somewhat difficult -    GAD 7 : Generalized Anxiety Score 12/06/2018 10/01/2016  Nervous, Anxious, on Edge 0 3  Control/stop worrying 0 3  Worry too much - different things 1 2  Trouble relaxing 0 0  Restless 0 0  Easily annoyed or irritable 0 3  Afraid - awful might happen 0 2  Total GAD 7 Score 1 13  Anxiety Difficulty Not difficult at all Not difficult at all    -------------------------------------------------------------------------- O: No physical exam performed due to  remote telephone encounter.  Lab results reviewed.  No results found for this or any previous visit (from the past 2160 hour(s)).  -------------------------------------------------------------------------- A&P:  Problem List Items Addressed This Visit    Centrilobular emphysema ([place]) Stable without flare up Re order medications currently    Relevant Medications   budesonide-formoterol (SYMBICORT) 160-4.5 MCG/ACT inhaler   tiotropium (SPIRIVA) 18 MCG inhalation capsule   VENTOLIN HFA 108 (90 Base) MCG/ACT inhaler   ipratropium-albuterol (DUONEB) 0.5-2.5 (3) MG/3ML SOLN   Chronic neck pain   Relevant Medications   amitriptyline (ELAVIL) 25 MG tablet   baclofen (LIORESAL) 10 MG tablet   citalopram (CELEXA) 40 MG tablet   gabapentin (NEURONTIN) 400 MG capsule   meloxicam (MOBIC) 15 MG tablet   Chronic pain of left lower extremity   Relevant Medications   amitriptyline (ELAVIL) 25 MG tablet   baclofen (LIORESAL) 10 MG tablet   citalopram (CELEXA) 40 MG tablet   gabapentin (NEURONTIN) 400 MG capsule   meloxicam (MOBIC) 15 MG tablet   GAD (generalized anxiety disorder)   Relevant Medications   amitriptyline (ELAVIL) 25 MG tablet   citalopram (CELEXA) 40 MG tablet   GERD (gastroesophageal reflux disease)   Relevant Medications   esomeprazole (NEXIUM) 40 MG capsule   Hypothyroidism   Relevant Medications   levothyroxine (SYNTHROID) 175 MCG tablet   Neuropathy   Relevant Medications   amitriptyline (ELAVIL) 25 MG tablet   gabapentin (NEURONTIN) 400 MG capsule   Recurrent major depression in partial remission (HCC) Improved mood fairly stable Some chronic emotional lability On SSRI and TCA currently    Relevant Medications   amitriptyline (ELAVIL) 25 MG tablet   citalopram (CELEXA) 40 MG tablet   Seborrheic dermatitis of scalp   Relevant Orders   Ambulatory referral to Dermatology    Other Visit Diagnoses    Type 2 diabetes, uncontrolled, with neuropathy ([place])    -   Primary  Due to return for A1c in 1 week for further management Encourage following DM diet lifestyle Wt loss Future consider other treatment options    Relevant Medications   metFORMIN (GLUCOPHAGE) 500 MG tablet   Chronic low back pain without sciatica, unspecified back pain laterality       Relevant Medications   baclofen (LIORESAL) 10 MG tablet  meloxicam (MOBIC) 15 MG tablet   Edema, unspecified type       Relevant Medications   furosemide (LASIX) 20 MG tablet - emphasized PRN use   Candidal intertrigo     Several locations due to increased moisture with skin folds, uses PRN nystatin to treat/prevent candidal skin infection   Relevant Medications   nystatin (MYCOSTATIN/NYSTOP) powder     Orders Placed This Encounter  Procedures  . Ambulatory referral to Dermatology    Referral Priority:   Routine    Referral Type:   Consultation    Referral Reason:   Specialty Services Required    Referred to Provider:   Jannet Mantis, MD    Requested Specialty:   Dermatology    Number of Visits Requested:   1    Meds ordered this encounter  Medications  . amitriptyline (ELAVIL) 25 MG tablet    Sig: Take 0.5 tablets (12.5 mg total) by mouth at bedtime.    Dispense:  45 tablet    Refill:  1  . baclofen (LIORESAL) 10 MG tablet    Sig: Take 1 tablet in morning, 1 tablet in afternoon, and 2 tablets in evening    Dispense:  360 tablet    Refill:  1    Updated rx, instructions, 90 day supply  . citalopram (CELEXA) 40 MG tablet    Sig: Take 1 tablet (40 mg total) by mouth daily.    Dispense:  90 tablet    Refill:  1  . esomeprazole (NEXIUM) 40 MG capsule    Sig: Take 1 capsule (40 mg total) by mouth daily before breakfast.    Dispense:  90 capsule    Refill:  1  . gabapentin (NEURONTIN) 400 MG capsule    Sig: Take 1 capsule (400 mg total) by mouth 3 (three) times daily.    Dispense:  270 capsule    Refill:  1  . levothyroxine (SYNTHROID) 175 MCG tablet    Sig: Take 1  tablet (175 mcg total) by mouth daily before breakfast.    Dispense:  90 tablet    Refill:  1    DX Code Needed  . = E03.4  . meloxicam (MOBIC) 15 MG tablet    Sig: Take 1 tablet (15 mg total) by mouth as needed.    Dispense:  90 tablet    Refill:  1  . metFORMIN (GLUCOPHAGE) 500 MG tablet    Sig: Take 1 tablet (500 mg total) by mouth 2 (two) times daily with a meal.    Dispense:  180 tablet    Refill:  1  . budesonide-formoterol (SYMBICORT) 160-4.5 MCG/ACT inhaler    Sig: TAKE 2 PUFFS BY MOUTH TWICE A DAY    Dispense:  30.6 Inhaler    Refill:  1  . tiotropium (SPIRIVA) 18 MCG inhalation capsule    Sig: Place 1 capsule (18 mcg total) into inhaler and inhale daily.    Dispense:  90 capsule    Refill:  1  . VENTOLIN HFA 108 (90 Base) MCG/ACT inhaler    Sig: INHALE 2 PUFFS BY MOUTH EVERY 6 HOURS AS NEEDED FOR WHEEZING/SHORTNESS OF BREATH    Dispense:  18 g    Refill:  1  . ipratropium-albuterol (DUONEB) 0.5-2.5 (3) MG/3ML SOLN    Sig: Take 3 mLs by nebulization every 2 (two) hours as needed (shortness of breath).    Dispense:  100 mL    Refill:  1    Please fill  quantity sufficient for 3 month fill  . nystatin (MYCOSTATIN/NYSTOP) powder    Sig: Apply topically 3 (three) times daily as needed.    Dispense:  60 g    Refill:  1    Follow-up: - Return in 6 months for yearly medicare checkup - Future labs ordered for 12/18/18 - within 1 week  Patient verbalizes understanding with the above medical recommendations including the limitation of remote medical advice.  Specific follow-up and call-back criteria were given for patient to follow-up or seek medical care more urgently if needed.   - Time spent in direct consultation with patient on phone: 17 minutes   Nobie Putnam, [facility] 12/06/2018, 11:56 AM

## 2018-12-06 NOTE — Patient Instructions (Addendum)
All medicines refilled. Blood work next week.  Referral sent to Dermatology  Please schedule a Follow-up Appointment to: Return in about 6 months (around 06/08/2019) for Yearly Medicare checkup.  If you have any other questions or concerns, please feel free to call the office or send a message through [place]. You may also schedule an earlier appointment if necessary.  Additionally, you may be receiving a survey about your experience at our office within a few days to 1 week by e-mail or mail. We value your feedback.  Nobie Putnam, DO [place]

## 2018-12-06 NOTE — Telephone Encounter (Signed)
The pt called back requesting that you send over a order for a Accu-chek Guide Me Glucometer,  lancets, and strip. Her glucometer is no longer working.

## 2018-12-18 ENCOUNTER — Other Ambulatory Visit: Payer: Medicaid Other

## 2019-03-13 ENCOUNTER — Other Ambulatory Visit: Payer: Self-pay | Admitting: Family Medicine

## 2019-03-13 DIAGNOSIS — Z1231 Encounter for screening mammogram for malignant neoplasm of breast: Secondary | ICD-10-CM

## 2019-03-15 ENCOUNTER — Telehealth (INDEPENDENT_AMBULATORY_CARE_PROVIDER_SITE_OTHER): Payer: Self-pay | Admitting: Vascular Surgery

## 2019-03-15 ENCOUNTER — Telehealth: Payer: Self-pay | Admitting: Family Medicine

## 2019-03-15 ENCOUNTER — Telehealth: Payer: Self-pay | Admitting: Gastroenterology

## 2019-03-15 NOTE — Telephone Encounter (Signed)
Pt called requesting  Labs to be added for kidney

## 2019-03-15 NOTE — Telephone Encounter (Signed)
Please contact patient to schedule as advised below per Arna Medici. AS, CMA

## 2019-03-15 NOTE — Telephone Encounter (Signed)
Please advise on the below. AS, CMA

## 2019-03-15 NOTE — Telephone Encounter (Signed)
She has future labs already ordered for 04/11/19 with a lab apt.  We have ordered kidney lab already.  Nobie Putnam, DO [facility] 03/15/2019, 4:20 PM

## 2019-03-15 NOTE — Telephone Encounter (Signed)
Pt would  Like a call from Erin Potter to explain why she is not due for a colonoscopy until 5 years from 2019 procedure since the  Polyp removed was benign but precancerous.  Please call [phone]

## 2019-03-15 NOTE — Telephone Encounter (Signed)
Bring her in with a reflux study. Me or dew

## 2019-03-21 ENCOUNTER — Other Ambulatory Visit: Payer: Self-pay

## 2019-03-21 NOTE — Telephone Encounter (Signed)
LVM for pt to return my call.

## 2019-03-26 NOTE — Telephone Encounter (Signed)
Left vm again for pt to return my call.  

## 2019-04-06 ENCOUNTER — Telehealth: Payer: Self-pay | Admitting: Family Medicine

## 2019-04-06 DIAGNOSIS — E1142 Type 2 diabetes mellitus with diabetic polyneuropathy: Secondary | ICD-10-CM

## 2019-04-06 DIAGNOSIS — R7989 Other specified abnormal findings of blood chemistry: Secondary | ICD-10-CM

## 2019-04-06 DIAGNOSIS — E1169 Type 2 diabetes mellitus with other specified complication: Secondary | ICD-10-CM | POA: Insufficient documentation

## 2019-04-06 DIAGNOSIS — E034 Atrophy of thyroid (acquired): Secondary | ICD-10-CM

## 2019-04-06 DIAGNOSIS — I10 Essential (primary) hypertension: Secondary | ICD-10-CM

## 2019-04-06 DIAGNOSIS — Z780 Asymptomatic menopausal state: Secondary | ICD-10-CM

## 2019-04-06 NOTE — Telephone Encounter (Signed)
She is overdue for Face to Face visit for Diabetes follow-up.  She needs A1c, Foot exam, Eye Exam, Urine microalbumin test.  I have placed orders for all of her labs - including kidney function and A1c for 04/11/19.  She may contact [facility] to schedule her DEXA  Please notify her that she needs to schedule office visit with me for Diabetes follow-up to do these required evaluations within 2 weeks of her lab draw.  Nobie Putnam, [facility] 04/06/2019, 5:27 PM

## 2019-04-06 NOTE — Telephone Encounter (Signed)
Pt needs order to have dexa scan, would also like to get her kidney function checked when she comes in for labs on 04/11/19

## 2019-04-09 ENCOUNTER — Ambulatory Visit
Admission: RE | Admit: 2019-04-09 | Discharge: 2019-04-09 | Disposition: A | Discharge status: Home / Self Care | Payer: Medicare Other | Source: Ambulatory Visit | Attending: Family Medicine | Admitting: Family Medicine

## 2019-04-09 ENCOUNTER — Other Ambulatory Visit: Payer: Self-pay

## 2019-04-09 ENCOUNTER — Inpatient Hospital Stay (HOSPITAL_COMMUNITY): Admit: 2019-04-09 | Discharge: 2019-04-09 | Disposition: A | Discharge status: Home / Self Care | Payer: Self-pay

## 2019-04-09 DIAGNOSIS — J439 Emphysema, unspecified: Secondary | ICD-10-CM | POA: Diagnosis not present

## 2019-04-09 DIAGNOSIS — M85852 Other specified disorders of bone density and structure, left thigh: Secondary | ICD-10-CM | POA: Diagnosis not present

## 2019-04-09 DIAGNOSIS — R0602 Shortness of breath: Secondary | ICD-10-CM | POA: Diagnosis not present

## 2019-04-09 DIAGNOSIS — Z1231 Encounter for screening mammogram for malignant neoplasm of breast: Secondary | ICD-10-CM

## 2019-04-09 DIAGNOSIS — Z13820 Encounter for screening for osteoporosis: Secondary | ICD-10-CM | POA: Diagnosis not present

## 2019-04-09 DIAGNOSIS — Z780 Asymptomatic menopausal state: Secondary | ICD-10-CM | POA: Diagnosis not present

## 2019-04-09 DIAGNOSIS — R0789 Other chest pain: Secondary | ICD-10-CM | POA: Diagnosis not present

## 2019-04-09 NOTE — Telephone Encounter (Addendum)
Left message on Friday she call back today and has mammogram scheduled for today so will schedule bone density appt and when get lab work done will schedule face to face appt with Dr. Raliegh Ip.  Her CBC was not ordered with previous lab I just place an order today for CBC if you don't want CBC you can delete it. Thank you.

## 2019-04-10 ENCOUNTER — Other Ambulatory Visit (INDEPENDENT_AMBULATORY_CARE_PROVIDER_SITE_OTHER): Payer: Self-pay | Admitting: Nurse Practitioner

## 2019-04-10 ENCOUNTER — Other Ambulatory Visit: Payer: Self-pay | Admitting: Specialist

## 2019-04-10 ENCOUNTER — Encounter: Payer: Medicare Other | Admitting: Obstetrics and Gynecology

## 2019-04-10 DIAGNOSIS — M79662 Pain in left lower leg: Secondary | ICD-10-CM

## 2019-04-10 DIAGNOSIS — R0602 Shortness of breath: Secondary | ICD-10-CM

## 2019-04-10 DIAGNOSIS — R0789 Other chest pain: Secondary | ICD-10-CM

## 2019-04-11 ENCOUNTER — Other Ambulatory Visit: Payer: Self-pay

## 2019-04-11 ENCOUNTER — Encounter (INDEPENDENT_AMBULATORY_CARE_PROVIDER_SITE_OTHER): Payer: Self-pay | Admitting: Nurse Practitioner

## 2019-04-11 ENCOUNTER — Other Ambulatory Visit: Payer: Medicare Other

## 2019-04-11 ENCOUNTER — Ambulatory Visit (INDEPENDENT_AMBULATORY_CARE_PROVIDER_SITE_OTHER): Payer: Medicare Other

## 2019-04-11 ENCOUNTER — Ambulatory Visit (INDEPENDENT_AMBULATORY_CARE_PROVIDER_SITE_OTHER): Payer: Medicare Other | Admitting: Nurse Practitioner

## 2019-04-11 VITALS — BP 179/83 | HR 88 | Resp 16 | Ht 63.5 in | Wt 225.0 lb

## 2019-04-11 DIAGNOSIS — R0789 Other chest pain: Secondary | ICD-10-CM | POA: Diagnosis not present

## 2019-04-11 DIAGNOSIS — G8929 Other chronic pain: Secondary | ICD-10-CM | POA: Diagnosis not present

## 2019-04-11 DIAGNOSIS — M79605 Pain in left leg: Secondary | ICD-10-CM

## 2019-04-11 DIAGNOSIS — J449 Chronic obstructive pulmonary disease, unspecified: Secondary | ICD-10-CM | POA: Diagnosis not present

## 2019-04-11 DIAGNOSIS — E1169 Type 2 diabetes mellitus with other specified complication: Secondary | ICD-10-CM | POA: Diagnosis not present

## 2019-04-11 DIAGNOSIS — I10 Essential (primary) hypertension: Secondary | ICD-10-CM

## 2019-04-11 DIAGNOSIS — E785 Hyperlipidemia, unspecified: Secondary | ICD-10-CM | POA: Diagnosis not present

## 2019-04-11 DIAGNOSIS — I83892 Varicose veins of left lower extremities with other complications: Secondary | ICD-10-CM | POA: Diagnosis not present

## 2019-04-11 DIAGNOSIS — E119 Type 2 diabetes mellitus without complications: Secondary | ICD-10-CM | POA: Diagnosis not present

## 2019-04-11 DIAGNOSIS — E034 Atrophy of thyroid (acquired): Secondary | ICD-10-CM | POA: Diagnosis not present

## 2019-04-11 DIAGNOSIS — M79662 Pain in left lower leg: Secondary | ICD-10-CM

## 2019-04-11 DIAGNOSIS — R600 Localized edema: Secondary | ICD-10-CM | POA: Diagnosis not present

## 2019-04-11 DIAGNOSIS — Z6841 Body Mass Index (BMI) 40.0 and over, adult: Secondary | ICD-10-CM | POA: Diagnosis not present

## 2019-04-11 DIAGNOSIS — E1142 Type 2 diabetes mellitus with diabetic polyneuropathy: Secondary | ICD-10-CM | POA: Diagnosis not present

## 2019-04-11 DIAGNOSIS — R945 Abnormal results of liver function studies: Secondary | ICD-10-CM | POA: Diagnosis not present

## 2019-04-12 ENCOUNTER — Other Ambulatory Visit: Payer: Self-pay | Admitting: Otolaryngology

## 2019-04-12 ENCOUNTER — Encounter (INDEPENDENT_AMBULATORY_CARE_PROVIDER_SITE_OTHER): Payer: Self-pay | Admitting: Nurse Practitioner

## 2019-04-12 DIAGNOSIS — M25512 Pain in left shoulder: Secondary | ICD-10-CM | POA: Diagnosis not present

## 2019-04-12 DIAGNOSIS — K1123 Chronic sialoadenitis: Secondary | ICD-10-CM | POA: Diagnosis not present

## 2019-04-12 DIAGNOSIS — M65331 Trigger finger, right middle finger: Secondary | ICD-10-CM | POA: Diagnosis not present

## 2019-04-12 DIAGNOSIS — H6982 Other specified disorders of Eustachian tube, left ear: Secondary | ICD-10-CM | POA: Diagnosis not present

## 2019-04-12 DIAGNOSIS — M7551 Bursitis of right shoulder: Secondary | ICD-10-CM | POA: Diagnosis not present

## 2019-04-12 DIAGNOSIS — D380 Neoplasm of uncertain behavior of larynx: Secondary | ICD-10-CM | POA: Diagnosis not present

## 2019-04-12 DIAGNOSIS — G8929 Other chronic pain: Secondary | ICD-10-CM | POA: Diagnosis not present

## 2019-04-12 DIAGNOSIS — M25511 Pain in right shoulder: Secondary | ICD-10-CM | POA: Diagnosis not present

## 2019-04-12 DIAGNOSIS — M65332 Trigger finger, left middle finger: Secondary | ICD-10-CM | POA: Diagnosis not present

## 2019-04-12 LAB — CBC WITH DIFFERENTIAL/PLATELET
Absolute Monocytes: 409 cells/uL (ref 200–950)
Basophils Absolute: 37 cells/uL (ref 0–200)
Basophils Relative: 0.6 %
Eosinophils Absolute: 341 cells/uL (ref 15–500)
Eosinophils Relative: 5.5 %
HCT: 37.9 % (ref 35.0–45.0)
Hemoglobin: 12 g/dL (ref 11.7–15.5)
Lymphs Abs: 1370 cells/uL (ref 850–3900)
MCH: 26.4 pg — ABNORMAL LOW (ref 27.0–33.0)
MCHC: 31.7 g/dL — ABNORMAL LOW (ref 32.0–36.0)
MCV: 83.5 fL (ref 80.0–100.0)
MPV: 11.6 fL (ref 7.5–12.5)
Monocytes Relative: 6.6 %
Neutro Abs: 4042 cells/uL (ref 1500–7800)
Neutrophils Relative %: 65.2 %
Platelets: 125 10*3/uL — ABNORMAL LOW (ref 140–400)
RBC: 4.54 10*6/uL (ref 3.80–5.10)
RDW: 15.7 % — ABNORMAL HIGH (ref 11.0–15.0)
Total Lymphocyte: 22.1 %
WBC: 6.2 10*3/uL (ref 3.8–10.8)

## 2019-04-12 LAB — COMPLETE METABOLIC PANEL WITH GFR
AG Ratio: 1.3 (calc) (ref 1.0–2.5)
ALT: 21 U/L (ref 6–29)
AST: 28 U/L (ref 10–35)
Albumin: 3.9 g/dL (ref 3.6–5.1)
Alkaline phosphatase (APISO): 95 U/L (ref 37–153)
BUN: 11 mg/dL (ref 7–25)
CO2: 28 mmol/L (ref 20–32)
Calcium: 8.9 mg/dL (ref 8.6–10.4)
Chloride: 106 mmol/L (ref 98–110)
Creat: 0.6 mg/dL (ref 0.50–1.05)
GFR, Est African American: 116 mL/min/{1.73_m2} (ref >=60)
GFR, Est Non African American: 100 mL/min/{1.73_m2} (ref >=60)
Globulin: 2.9 g/dL (calc) (ref 1.9–3.7)
Glucose, Bld: 136 mg/dL — ABNORMAL HIGH (ref 65–99)
Potassium: 4.3 mmol/L (ref 3.5–5.3)
Sodium: 142 mmol/L (ref 135–146)
Total Bilirubin: 0.5 mg/dL (ref 0.2–1.2)
Total Protein: 6.8 g/dL (ref 6.1–8.1)

## 2019-04-12 LAB — LIPID PANEL
Cholesterol: 177 mg/dL (ref <200)
HDL: 45 mg/dL — ABNORMAL LOW (ref >=50)
LDL Cholesterol (Calc): 109 mg/dL (calc) — ABNORMAL HIGH
Non-HDL Cholesterol (Calc): 132 mg/dL (calc) — ABNORMAL HIGH (ref <130)
Total CHOL/HDL Ratio: 3.9 (calc) (ref <5.0)
Triglycerides: 124 mg/dL (ref <150)

## 2019-04-12 LAB — HEMOGLOBIN A1C
Hgb A1c MFr Bld: 6.5 % of total Hgb — ABNORMAL HIGH (ref <5.7)
Mean Plasma Glucose: 140 (calc)
eAG (mmol/L): 7.7 (calc)

## 2019-04-12 LAB — T4, FREE: Free T4: 1.1 ng/dL (ref 0.8–1.8)

## 2019-04-12 LAB — TSH: TSH: 0.41 mIU/L (ref 0.40–4.50)

## 2019-04-12 NOTE — Progress Notes (Signed)
SUBJECTIVE:  Patient ID: Erin Potter, female    DOB: [birth year], 58 y.o.   MRN: [id number] Chief Complaint  Patient presents with  . Follow-up    ultrasound follow up    HPI  Erin Potter is a 58 y.o. female that presents today complaining of pain within her left lower extremity.  The patient states that she has pain that somewhat fluctuates.  She states that sometimes the pain is sharp when walking but that she also continues to have the pain when sitting.  She also states that whenever she touches the leg it is also very sore to the touch.  She states that sometimes at night her leg aches so much that it is hard to get comfortable.  The patient also does endorse having a history of restless leg syndrome.  The patient states that the pain is relieved when she takes gabapentin and baclofen during the evening.  The patient also endorses a history of neuropathy as well.  She denies any fever, chills, nausea, vomiting or diarrhea.  The patient was also seen in our office approximately 2 years ago which she was found to have reflux in her left lower extremity however did not wish to proceed with any intervention at that time.  Since that time the patient has not been wearing medical grade 1 compression stockings.  Today the patient underwent noninvasive studies which revealed no evidence of right common femoral vein obstruction.  The left lower extremity reveals no evidence of DVT, superficial venous thrombosis or chronic venous insufficiency.  However due to body habitus there was some limitations to the study.  Past Medical History:  Diagnosis Date  . Anxiety   . Asthma   . Diabetes mellitus, type 2 ([place])   . GERD (gastroesophageal reflux disease)   . Hypertension   . Hypothyroidism   . Neuromuscular disorder ([place])    LEFT ARM WEAKNESS-ULNAR NERVE ISSUE  . Shortness of breath dyspnea   . Sleep apnea    NO CPAP-ONLY FINISHED HALF TEST  . Wears dentures    full upper,  partial lower    Past Surgical History:  Procedure Laterality Date  . BREAST BIOPSY Left 02/09/2017   neg  . COLONOSCOPY WITH PROPOFOL N/A 02/21/2015   Procedure: COLONOSCOPY WITH PROPOFOL;  Surgeon: [name], MD;  Location: [facility];  Service: Endoscopy;  Laterality: N/A;  . COLONOSCOPY WITH PROPOFOL N/A 05/15/2018   Procedure: COLONOSCOPY WITH BIOPSIES;  Surgeon: [name], MD;  Location: [facility];  Service: Endoscopy;  Laterality: N/A;  . LEFT HEART CATH AND CORONARY ANGIOGRAPHY N/A 02/07/2017   Procedure: Left Heart Cath and Coronary Angiography;  Surgeon: Isaias Cowman, MD;  Location: [facility];  Service: Cardiovascular;  Laterality: N/A;  . LEG SURGERY Right   . NECK SURGERY     EXPLORATORY  . POLYPECTOMY  02/21/2015   Procedure: POLYPECTOMY;  Surgeon: [name], MD;  Location: [facility];  Service: Endoscopy;;  . POLYPECTOMY N/A 05/15/2018   Procedure: POLYPECTOMY;  Surgeon: [name], MD;  Location: [facility];  Service: Endoscopy;  Laterality: N/A;  . TONSILLECTOMY    . TUBAL LIGATION    . TUBALPLASTY     LAPAROSCOPY    Social History   Socioeconomic History  . Marital status: Widowed    Spouse name: Not on file  . Number of children: Not on file  . Years of education: Not on file  . Highest education level: Not  on file  Occupational History  . Not on file  Social Needs  . Financial resource strain: Not on file  . Food insecurity    Worry: Not on file    Inability: Not on file  . Transportation needs    Medical: Not on file    Non-medical: Not on file  Tobacco Use  . Smoking status: Former Smoker    Packs/day: 1.50    Years: 35.00    Pack years: 52.50    Types: Cigarettes    Quit date: 02/24/2014    Years since quitting: 5.1  . Smokeless tobacco: Former Network engineer and Sexual Activity  . Alcohol use: No    Alcohol/week: 0.0 standard drinks  . Drug use: No  . Sexual activity: Not  Currently    Birth control/protection: Post-menopausal  Lifestyle  . Physical activity    Days per week: Not on file    Minutes per session: Not on file  . Stress: Not on file  Relationships  . Social Herbalist on phone: Not on file    Gets together: Not on file    Attends religious service: Not on file    Active member of club or organization: Not on file    Attends meetings of clubs or organizations: Not on file    Relationship status: Not on file  . Intimate partner violence    Fear of current or ex partner: Not on file    Emotionally abused: Not on file    Physically abused: Not on file    Forced sexual activity: Not on file  Other Topics Concern  . Not on file  Social History Narrative  . Not on file    Family History  Problem Relation Age of Onset  . Hypertension Mother   . Thyroid disease Mother   . Heart disease Father   . Diabetes Father   . Stroke Father   . Heart attack Father   . Hypertension Maternal Grandmother   . Hypertension Maternal Grandfather   . Heart attack Maternal Grandfather   . Cancer Paternal Grandmother   . Alzheimer's disease Paternal Grandmother   . Breast cancer Neg Hx     Allergies  Allergen Reactions  . Clindamycin Hives  . Pseudoeph-Doxylamine-Dm-Apap Hives  . 2,4-D Dimethylamine (Amisol) Other (See Comments)    Unknown to pt  . Clindamycin Hcl   . Guaifenesin Other (See Comments)  . Guaifenesin & Derivatives      Review of Systems   Review of Systems: Negative Unless Checked Constitutional: '[]' Weight loss  '[]' Fever  '[]' Chills Cardiac: '[]' Chest pain   '[]'  Atrial Fibrillation  '[]' Palpitations   '[]' Shortness of breath when laying flat   '[]' Shortness of breath with exertion. '[]' Shortness of breath at rest Vascular:  '[]' Pain in legs with walking   '[]' Pain in legs with standing '[]' Pain in legs when laying flat   '[]' Claudication    '[]' Pain in feet when laying flat    '[]' History of DVT   '[]' Phlebitis   '[x]' Swelling in legs   '[x]' Varicose  veins   '[]' Non-healing ulcers Pulmonary:   '[]' Uses home oxygen   '[]' Productive cough   '[]' Hemoptysis   '[]' Wheeze  '[]' COPD   '[]' Asthma Neurologic:  '[]' Dizziness   '[]' Seizures  '[]' Blackouts '[]' History of stroke   '[]' History of TIA  '[]' Aphasia   '[]' Temporary Blindness   '[]' Weakness or numbness in arm   '[x]' Weakness or numbness in leg Musculoskeletal:   '[]' Joint swelling   '[]' Joint pain   '[x]' Low  back pain  '[]'  History of Knee Replacement '[]' Arthritis '[]' back Surgeries  '[]'  Spinal Stenosis    Hematologic:  '[]' Easy bruising  '[]' Easy bleeding   '[]' Hypercoagulable state   '[]' Anemic Gastrointestinal:  '[]' Diarrhea   '[]' Vomiting  '[]' Gastroesophageal reflux/heartburn   '[]' Difficulty swallowing. '[]' Abdominal pain Genitourinary:  '[]' Chronic kidney disease   '[]' Difficult urination  '[]' Anuric   '[]' Blood in urine '[]' Frequent urination  '[]' Burning with urination   '[]' Hematuria Skin:  '[]' Rashes   '[]' Ulcers '[]' Wounds Psychological:  '[]' History of anxiety   '[]'  History of major depression  '[]'  Memory Difficulties      OBJECTIVE:   Physical Exam  BP (!) 179/83 (BP Location: Right Arm)   Pulse 88   Resp 16   Ht 5' 3.5" (1.613 m)   Wt 225 lb (102.1 kg)   BMI 39.23 kg/m   Gen: WD/WN, NAD Head: [place]/AT, No temporalis wasting.  Ear/Nose/Throat: Hearing grossly intact, nares w/o erythema or drainage Eyes: PER, EOMI, sclera nonicteric.  Neck: Supple, no masses.  No JVD.  Pulmonary:  Good air movement, no use of accessory muscles.  Cardiac: RRR Vascular: Large varicosities present extensively greater than 3 mm.  Mild venous stasis changes to the legs bilaterally.  2+ soft pitting edema Vessel Right Left  Radial Palpable Palpable  Dorsalis Pedis Palpable Palpable  Posterior Tibial Palpable Palpable   Gastrointestinal: soft, non-distended. No guarding/no peritoneal signs.  Musculoskeletal: M/S 5/5 throughout.  No deformity or atrophy.  Neurologic: Pain and light touch intact in extremities.  Symmetrical.  Speech is fluent. Motor exam as listed above.  Psychiatric: Judgment intact, Mood & affect appropriate for pt's clinical situation. Dermatologic: No Venous rashes. No Ulcers Noted.  No changes consistent with cellulitis. Lymph : No Cervical lymphadenopathy, no lichenification or skin changes of chronic lymphedema.       ASSESSMENT AND PLAN:  1. Chronic pain of left lower extremity I had a long discussion with the patient and discussed the pathophysiology as well as typical presenting symptoms of vascular disease.  Based upon the patient's timing and description of pain, I do not believe that this is fully related to varicose veins, especially due to the fact that baclofen and gabapentin are able to relieve her pain.  Patient does have some issues with her back but denies any pain at this time.  I have suggested with the patient for her to follow-up with her PCP for further work-up of possible causes of her leg pain.  As far as addressing the prominent varicose veins, see below  2. Essential hypertension Continue antihypertensive medications as already ordered, these medications have been reviewed and there are no changes at this time.   3. Varicose veins of leg with swelling, left Current reflux studies did not reveal any evidence of reflux within her left lower extremity.  The patient does have some varicosities of her left lower extremity.  I discussed conservative management with the patient such as use of compression stockings, elevation as well as NSAIDs for pain.  Also advised her to exercise as much as she is possible.  We will have the patient return in 6 months to see if there may be evidence of increased reflux such as what we saw with the 2018 study - VAS [place] LOWER EXTREMITY VENOUS REFLUX; Future   Current Outpatient Medications on File Prior to Visit  Medication Sig Dispense Refill  . amitriptyline (ELAVIL) 25 MG tablet Take 0.5 tablets (12.5 mg total) by mouth at bedtime. 45 tablet 1  . aspirin EC 81  MG tablet Take 81 mg by  mouth at bedtime.    . Aspirin-Salicylamide-Caffeine (BC HEADACHE PO) Take 2 packets by mouth daily as needed (pain).    . baclofen (LIORESAL) 10 MG tablet Take 1 tablet in morning, 1 tablet in afternoon, and 2 tablets in evening 360 tablet 1  . Black Pepper-Turmeric (TURMERIC COMPLEX/BLACK PEPPER) 3-500 MG CAPS Take by mouth.    . Blood Glucose Monitoring Suppl (ACCU-CHEK GUIDE ME) w/Device KIT 1 kit by Does not apply route daily. Check blood sugar up to x 2 daily as instructed 1 kit 0  . budesonide-formoterol (SYMBICORT) 160-4.5 MCG/ACT inhaler TAKE 2 PUFFS BY MOUTH TWICE A DAY 30.6 Inhaler 1  . Calcium Carbonate-Vit D-Min (CALCIUM 600+D PLUS MINERALS PO) Take 1 tablet by mouth daily.     . chlorhexidine (PERIDEX) 0.12 % solution 5 mLs by Mouth Rinse route daily as needed (mouth sores).   0  . citalopram (CELEXA) 40 MG tablet Take 1 tablet (40 mg total) by mouth daily. 90 tablet 1  . Cranberry 450 MG TABS Take by mouth.    Drusilla Kanner FRUIT PO Take by mouth.    Marland Kitchen DIFFERIN 0.1 % cream   0  . esomeprazole (NEXIUM) 40 MG capsule Take 1 capsule (40 mg total) by mouth daily before breakfast. 90 capsule 1  . ferrous sulfate 325 (65 FE) MG tablet Take 325 mg by mouth daily.     . fluoruracil (CARAC) 0.5 % cream Apply 0.5 mg topically daily as needed (skin spots).   0  . furosemide (LASIX) 20 MG tablet Take 1 tablet (20 mg total) by mouth daily as needed. Alternate taking 1, 20 mg Lasix with 2, 20 mg tabs, every other day 30 tablet 2  . gabapentin (NEURONTIN) 400 MG capsule Take 1 capsule (400 mg total) by mouth 3 (three) times daily. 270 capsule 1  . glucose blood test strip Check blood sugar up to 2 x daily as instructed 100 each 3  . ipratropium-albuterol (DUONEB) 0.5-2.5 (3) MG/3ML SOLN Take 3 mLs by nebulization every 2 (two) hours as needed (shortness of breath). 100 mL 1  . ketoconazole (NIZORAL) 2 % shampoo Apply 1 application topically 3 (three) times a week.   0  . Lancets (ACCU-CHEK SOFT  TOUCH) lancets Check blood sugar up to 2 x daily as instructed 100 each 3  . levothyroxine (SYNTHROID) 175 MCG tablet Take 1 tablet (175 mcg total) by mouth daily before breakfast. 90 tablet 1  . meloxicam (MOBIC) 15 MG tablet Take 1 tablet (15 mg total) by mouth as needed. 90 tablet 1  . metFORMIN (GLUCOPHAGE) 500 MG tablet Take 1 tablet (500 mg total) by mouth 2 (two) times daily with a meal. 180 tablet 1  . Milk Thistle 500 MG CAPS Take 500 mg by mouth 2 (two) times daily.     . Multiple Vitamin (MULTIVITAMIN) tablet Take 1 tablet by mouth daily.    Marland Kitchen nystatin (MYCOSTATIN/NYSTOP) powder Apply topically 3 (three) times daily as needed. 60 g 1  . pravastatin (PRAVACHOL) 10 MG tablet Take by mouth.    . Probiotic Product (PRO-BIOTIC BLEND PO) Take 1 capsule by mouth daily.    Marland Kitchen tiotropium (SPIRIVA) 18 MCG inhalation capsule Place 1 capsule (18 mcg total) into inhaler and inhale daily. 90 capsule 1  . TURMERIC PO Take by mouth.    . VENTOLIN HFA 108 (90 Base) MCG/ACT inhaler INHALE 2 PUFFS BY MOUTH EVERY 6 HOURS AS NEEDED FOR WHEEZING/SHORTNESS  OF BREATH 18 g 1  . fluocinonide (LIDEX) 0.05 % external solution Apply 1 application topically daily as needed for irritation.  0  . Iodine, Kelp, (KELP PO) Take by mouth.     No current facility-administered medications on file prior to visit.     There are no Patient Instructions on file for this visit. No follow-ups on file.   Kris Hartmann, NP  This note was completed with Sales executive.  Any errors are purely unintentional.

## 2019-04-27 ENCOUNTER — Telehealth: Payer: Self-pay | Admitting: Family Medicine

## 2019-04-27 NOTE — Telephone Encounter (Signed)
Pt called requesting  Order (prescription) for a diabetic shoes

## 2019-04-27 NOTE — Telephone Encounter (Signed)
Left message patient needs appointment

## 2019-05-14 ENCOUNTER — Other Ambulatory Visit: Payer: Self-pay

## 2019-05-14 ENCOUNTER — Encounter: Payer: Self-pay | Admitting: Family Medicine

## 2019-05-14 ENCOUNTER — Ambulatory Visit (INDEPENDENT_AMBULATORY_CARE_PROVIDER_SITE_OTHER): Payer: Medicare Other | Admitting: Family Medicine

## 2019-05-14 ENCOUNTER — Ambulatory Visit
Admission: RE | Admit: 2019-05-14 | Discharge: 2019-05-14 | Disposition: A | Discharge status: Home / Self Care | Payer: Medicare Other | Source: Ambulatory Visit | Attending: Specialist | Admitting: Specialist

## 2019-05-14 ENCOUNTER — Other Ambulatory Visit: Payer: Self-pay | Admitting: Family Medicine

## 2019-05-14 VITALS — BP 173/79 | Ht 63.5 in | Wt 228.6 lb

## 2019-05-14 DIAGNOSIS — E1142 Type 2 diabetes mellitus with diabetic polyneuropathy: Secondary | ICD-10-CM

## 2019-05-14 DIAGNOSIS — E1169 Type 2 diabetes mellitus with other specified complication: Secondary | ICD-10-CM

## 2019-05-14 DIAGNOSIS — R0602 Shortness of breath: Secondary | ICD-10-CM | POA: Diagnosis not present

## 2019-05-14 DIAGNOSIS — R0789 Other chest pain: Secondary | ICD-10-CM | POA: Insufficient documentation

## 2019-05-14 DIAGNOSIS — Z23 Encounter for immunization: Secondary | ICD-10-CM | POA: Diagnosis not present

## 2019-05-14 DIAGNOSIS — K1123 Chronic sialoadenitis: Secondary | ICD-10-CM

## 2019-05-14 DIAGNOSIS — F3341 Major depressive disorder, recurrent, in partial remission: Secondary | ICD-10-CM

## 2019-05-14 DIAGNOSIS — J432 Centrilobular emphysema: Secondary | ICD-10-CM

## 2019-05-14 DIAGNOSIS — E785 Hyperlipidemia, unspecified: Secondary | ICD-10-CM | POA: Diagnosis not present

## 2019-05-14 DIAGNOSIS — R221 Localized swelling, mass and lump, neck: Secondary | ICD-10-CM | POA: Diagnosis not present

## 2019-05-14 DIAGNOSIS — B372 Candidiasis of skin and nail: Secondary | ICD-10-CM

## 2019-05-14 LAB — POCT UA - MICROALBUMIN: Microalbumin Ur, POC: NEGATIVE mg/L

## 2019-05-14 MED ORDER — IOHEXOL 300 MG/ML  SOLN
75.0000 mL | Freq: Once | INTRAMUSCULAR | Status: AC | PRN
  Administered 2019-05-14: 75 mL via INTRAVENOUS

## 2019-05-14 MED ORDER — NYSTATIN 100000 UNIT/GM EX CREA: 1.0000 "application " | TOPICAL_CREAM | Freq: Three times a day (TID) | CUTANEOUS | 2 refills | Status: DC

## 2019-05-14 MED ORDER — NYSTATIN 100000 UNIT/GM EX POWD: Freq: Three times a day (TID) | CUTANEOUS | 2 refills | Status: AC | PRN

## 2019-05-14 MED ORDER — NYSTATIN 100000 UNIT/GM EX POWD: Freq: Three times a day (TID) | CUTANEOUS | 2 refills | Status: DC | PRN

## 2019-05-14 MED ORDER — NYSTATIN 100000 UNIT/GM EX CREA: 1.0000 "application " | TOPICAL_CREAM | Freq: Three times a day (TID) | CUTANEOUS | 2 refills | Status: AC

## 2019-05-14 NOTE — Progress Notes (Signed)
Subjective:    Patient ID: Erin Potter, female    DOB: Jul 29, 1960, 58 y.o.   MRN: [id number]  Erin Potter is a 58 y.o. female presenting on 05/14/2019 for Diabetes   HPI   CHRONIC DM, Type 2 with peripheral Neuropathy Morbid Obesity BMI >39 Reports no concerns. A1c has improved. CBGs: Improved Meds: Metformin IR 500mg  BID Reports good compliance. Tolerating well w/o side-effects Currently not on on ACEi / ARB - needs Urine microalbumin today Lifestyle: - Diet (improving diet)  - Exercise (limited exercise) Admits dry skin dermatitis of feet, chronic problem. Admits callus and issues with feet, with some reduced nerve sensation R > L foot, asking about diabetic shoes today. Denies hypoglycemia, polyuria, visual changes, numbness or tingling.  Centrilobular Emphysema On Spiriva Not using albuterol regularly No recent flare ups  Additional questions - Regarding weight management - she is asking about phentermine rx and diet.    Health Maintenance: Due for Flu Shot, will receive today  Offer PNA vaccine, will defer this for now.  Depression screen [facility] 2/9 05/14/2019 12/06/2018 03/07/2018  Decreased Interest 0 1 3  Down, Depressed, Hopeless 1 0 3  PHQ - 2 Score 1 1 6   Altered sleeping 3 0 3  Tired, decreased energy 3 0 3  Change in appetite 0 0 0  Feeling bad or failure about yourself  2 0 0  Trouble concentrating 0 0 0  Moving slowly or fidgety/restless 0 0 0  Suicidal thoughts 0 0 0  PHQ-9 Score 9 1 12   Difficult doing work/chores Somewhat difficult Not difficult at all Somewhat difficult    Social History   Tobacco Use  . Smoking status: Former Smoker    Packs/day: 1.50    Years: 35.00    Pack years: 52.50    Types: Cigarettes    Quit date: 02/24/2014    Years since quitting: 5.2  . Smokeless tobacco: Former Network engineer Use Topics  . Alcohol use: No    Alcohol/week: 0.0 standard drinks  . Drug use: No    Review of Systems Per  HPI unless specifically indicated above     Objective:    BP (!) 173/79 (BP Location: Left Arm, Patient Position: Sitting, Cuff Size: Normal)   Ht 5' 3.5" (1.613 m)   Wt 228 lb 9.6 oz (103.7 kg)   BMI 39.86 kg/m   Wt Readings from Last 3 Encounters:  05/14/19 228 lb 9.6 oz (103.7 kg)  04/11/19 225 lb (102.1 kg)  05/15/18 213 lb (96.6 kg)    Physical Exam Vitals signs and nursing note reviewed.  Constitutional:      General: She is not in acute distress.    Appearance: She is well-developed. She is not diaphoretic.     Comments: Well-appearing, comfortable, cooperative  HENT:     Head: Normocephalic and atraumatic.  Eyes:     General:        Right eye: No discharge.        Left eye: No discharge.     Conjunctiva/sclera: Conjunctivae normal.  Cardiovascular:     Rate and Rhythm: Normal rate.  Pulmonary:     Effort: Pulmonary effort is normal.  Skin:    General: Skin is warm and dry.     Findings: No erythema or rash.  Neurological:     Mental Status: She is alert and oriented to person, place, and time.  Psychiatric:        Behavior: Behavior normal.  Comments: Well groomed, good eye contact, normal speech and thoughts      Diabetic Foot Exam - Simple   Simple Foot Form Diabetic Foot exam was performed with the following findings: Yes 05/14/2019  2:11 PM  Visual Inspection See comments: Yes Sensation Testing Intact to touch and monofilament testing bilaterally: Yes Pulse Check Posterior Tibialis and Dorsalis pulse intact bilaterally: Yes Comments Dry skin bilateral feet generalized, several areas of mild callus, with reduced sensation to monofilament on Right foot compared to Left.      Results for orders placed or performed in visit on 05/14/19  POCT UA - Microalbumin  Result Value Ref Range   Microalbumin Ur, POC negative mg/L   Creatinine, POC     Albumin/Creatinine Ratio, Urine, POC        Assessment & Plan:   Problem List Items Addressed This  Visit    Type 2 diabetes, controlled, with peripheral neuropathy ([place]) - Primary    Well-controlled DM with 123456 6.5 Complications - peripheral neuropathy with callus formation ,hyperlipidemia, GERD, depression, obesity, - increases risk of future cardiovascular complications   Plan:  1. Continue current therapy - Metformin IR 500mg  BID - discuss she can start to taper down in future if able to wean off med and diet control completely 2. Encourage improved lifestyle - low carb, low sugar diet, reduce portion size, continue improving regular exercise 3. Check CBG , bring log to next visit for review 4. Continue ASA, ACEi, Statin Advised to schedule DM ophtho exam, send record  --------------  Completed DM Foot Exam today in office. See exam note.  - Proceed with ordering Diabetic Shoes, completed form today, will request patient to drop off at [employer] - we can fax office note when ready - Patient would benefit from Diabetic Shoes due to neuropathy with callus formation, diabetes control is improving on current regimen, and I am continuing to monitor and manage diabetes.      Relevant Orders   POCT UA - Microalbumin (Completed)   Recurrent major depression in partial remission ([place])   Morbid obesity ([facility])    Abnormal weight gain BMI >39 with morbid obesity due to diagnosis diabetes and hyperlipidemia      Hyperlipidemia associated with type 2 diabetes mellitus ([place])   Centrilobular emphysema ([place])    Controlled without flare up On maintenance therapy       Other Visit Diagnoses    Needs flu shot       Relevant Orders   Flu Vaccine QUAD 36+ mos IM (Completed)      No orders of the defined types were placed in this encounter.   Follow up plan: Return in about 6 months (around 11/11/2019) for DM A1c.   Nobie Putnam, DO [facility] 05/14/2019, 2:03 PM

## 2019-05-14 NOTE — Addendum Note (Signed)
Addended by: [name] on: 05/14/2019 02:28 PM   Modules accepted: Orders

## 2019-05-14 NOTE — Patient Instructions (Addendum)
Thank you for coming to the office today.  Please ask Dermatologist about dry skin and skin cancer screening.  --------------------------------  Diabetic [facility]  [facility] [address] [place],  [address] Open until [place] Phone: [phone] - Diabetic Shoe Fitting / Orders - Print order form off CMS website, bring paper copy    Please schedule a Follow-up Appointment to: Return in about 6 months (around 11/11/2019) for DM A1c.  If you have any other questions or concerns, please feel free to call the office or send a message through [place]. You may also schedule an earlier appointment if necessary.  Additionally, you may be receiving a survey about your experience at our office within a few days to 1 week by e-mail or mail. We value your feedback.  Nobie Putnam, DO [place]

## 2019-05-15 ENCOUNTER — Encounter: Payer: Self-pay | Admitting: Family Medicine

## 2019-05-15 DIAGNOSIS — M7551 Bursitis of right shoulder: Secondary | ICD-10-CM | POA: Diagnosis not present

## 2019-05-15 DIAGNOSIS — M65331 Trigger finger, right middle finger: Secondary | ICD-10-CM | POA: Diagnosis not present

## 2019-05-15 DIAGNOSIS — M180 Bilateral primary osteoarthritis of first carpometacarpal joints: Secondary | ICD-10-CM | POA: Diagnosis not present

## 2019-05-15 DIAGNOSIS — M65332 Trigger finger, left middle finger: Secondary | ICD-10-CM | POA: Diagnosis not present

## 2019-05-15 NOTE — Assessment & Plan Note (Signed)
Controlled without flare up On maintenance therapy

## 2019-05-15 NOTE — Assessment & Plan Note (Signed)
Abnormal weight gain BMI >39 with morbid obesity due to diagnosis diabetes and hyperlipidemia

## 2019-05-15 NOTE — Assessment & Plan Note (Addendum)
Well-controlled DM with 123456 6.5 Complications - peripheral neuropathy with callus formation ,hyperlipidemia, GERD, depression, obesity, - increases risk of future cardiovascular complications   Plan:  1. Continue current therapy - Metformin IR 500mg  BID - discuss she can start to taper down in future if able to wean off med and diet control completely 2. Encourage improved lifestyle - low carb, low sugar diet, reduce portion size, continue improving regular exercise 3. Check CBG , bring log to next visit for review 4. Continue ASA, ACEi, Statin Advised to schedule DM ophtho exam, send record  --------------  Completed DM Foot Exam today in office. See exam note.  - Proceed with ordering Diabetic Shoes, completed form today, will request patient to drop off at [employer] - we can fax office note when ready - Patient would benefit from Diabetic Shoes due to neuropathy with callus formation, diabetes control is improving on current regimen, and I am continuing to monitor and manage diabetes.

## 2019-05-18 ENCOUNTER — Telehealth: Payer: Self-pay

## 2019-05-18 NOTE — Telephone Encounter (Signed)
Nystatin cream normally comes in 30g tube. I ordered 90g tube for her. I am not sure how much pharmacy gave her, she should check with them. That is sufficient quantity to use regularly. Otherwise, she needs to notify pharmacy to send [place] a new request with the preferred amount.  Nobie Putnam, DO [facility] 05/18/2019, 12:32 PM

## 2019-05-18 NOTE — Telephone Encounter (Signed)
Patient called stating that all her meds should have been sent in for 90 days and they were not.  Also, nystatin cream was only called in for 10 days. She states that is not right because she uses it everyday.  Please call patient back.

## 2019-05-18 NOTE — Telephone Encounter (Signed)
Spoke to the pharmacy and patient, Pharmacy error trying to give her refill for 30 days which was past due compare to this one, advised to give her 90 days supply and patient notified.

## 2019-05-18 NOTE — Telephone Encounter (Signed)
Left message

## 2019-06-21 ENCOUNTER — Telehealth: Payer: Self-pay | Admitting: Family Medicine

## 2019-06-21 NOTE — Telephone Encounter (Signed)
I left a message asking the patient to call and schedule AWV-I with Tiffany. VDM (DD) °

## 2019-07-05 ENCOUNTER — Other Ambulatory Visit: Payer: Self-pay | Admitting: Family Medicine

## 2019-07-05 DIAGNOSIS — IMO0002 Reserved for concepts with insufficient information to code with codable children: Secondary | ICD-10-CM

## 2019-07-05 DIAGNOSIS — E1140 Type 2 diabetes mellitus with diabetic neuropathy, unspecified: Secondary | ICD-10-CM

## 2019-07-12 ENCOUNTER — Other Ambulatory Visit: Payer: Self-pay | Admitting: Family Medicine

## 2019-07-12 DIAGNOSIS — M545 Low back pain, unspecified: Secondary | ICD-10-CM

## 2019-07-12 DIAGNOSIS — F3341 Major depressive disorder, recurrent, in partial remission: Secondary | ICD-10-CM

## 2019-07-12 DIAGNOSIS — G8929 Other chronic pain: Secondary | ICD-10-CM

## 2019-07-12 DIAGNOSIS — F411 Generalized anxiety disorder: Secondary | ICD-10-CM

## 2019-07-12 DIAGNOSIS — J432 Centrilobular emphysema: Secondary | ICD-10-CM

## 2019-07-12 DIAGNOSIS — E034 Atrophy of thyroid (acquired): Secondary | ICD-10-CM

## 2019-07-12 DIAGNOSIS — K219 Gastro-esophageal reflux disease without esophagitis: Secondary | ICD-10-CM

## 2019-07-17 ENCOUNTER — Telehealth: Payer: Self-pay | Admitting: Family Medicine

## 2019-07-17 NOTE — Telephone Encounter (Signed)
I spoke with the patient and schedule an appt for Thursday, January 7th. The pt complains of elevated blood pressure reading, headache and pressure in her chest that she associate with her pressure been so high in her arteries. She states that her blood pressure been so high that she is concern for a possible stroke. I instructed the patient that her symptoms sound like she need emergent care. She declined hospital/ urgent care visit. Appt scheduled for Thursday. I recommended if her symptoms worsen to seek emergency care.

## 2019-07-17 NOTE — Telephone Encounter (Signed)
Agree, concerning symptoms reported. She has had issues with non-adherence to follow-up in past to office visit. Agree with her seeking care sooner if worsening and triage advice.  Nobie Putnam, [facility] 07/17/2019, 2:53 PM

## 2019-07-17 NOTE — Telephone Encounter (Signed)
I would need more information on what the patient's problem is right now.  She did have elevated BP at last office visit.  I don't see any BP medications currently except a fluid pill.  If she wants to discuss BP medications and treatment options for elevated BP we would need some Home BP readings and she can schedule virtual visit to discuss new treatment options.  Let me know if there was a different question - but this is best I can gather from review of her chart.  Nobie Putnam, [facility] 07/17/2019, 2:15 PM

## 2019-07-17 NOTE — Telephone Encounter (Signed)
Pt called on yesterday would not schedule appt.for Tues.,Wednesday ( now we only have appt for Thurs. Friday). PT is requesting a call back on what she can do about  her BP.

## 2019-07-19 ENCOUNTER — Encounter: Payer: Self-pay | Admitting: Family Medicine

## 2019-07-19 ENCOUNTER — Other Ambulatory Visit: Payer: Self-pay

## 2019-07-19 ENCOUNTER — Ambulatory Visit (INDEPENDENT_AMBULATORY_CARE_PROVIDER_SITE_OTHER): Payer: Medicare Other | Admitting: Family Medicine

## 2019-07-19 ENCOUNTER — Other Ambulatory Visit: Payer: Self-pay | Admitting: Family Medicine

## 2019-07-19 DIAGNOSIS — I10 Essential (primary) hypertension: Secondary | ICD-10-CM | POA: Diagnosis not present

## 2019-07-19 DIAGNOSIS — J432 Centrilobular emphysema: Secondary | ICD-10-CM

## 2019-07-19 DIAGNOSIS — G629 Polyneuropathy, unspecified: Secondary | ICD-10-CM

## 2019-07-19 MED ORDER — HYDROCHLOROTHIAZIDE 25 MG PO TABS: 25.0000 mg | ORAL_TABLET | Freq: Every day | ORAL | 1 refills | Status: DC

## 2019-07-19 NOTE — Patient Instructions (Addendum)
Start Hydrochlorothiazide 25mg  daily now to control BP Use furosemide ONLY AS NEEDED now If not controlled if BP >160/90 still, contact us as soon as you can by Monday next week or sooner if need we can add Lisinopril on as well.  Future will need repeat a blood panel  Can discuss weight loss surgery in future  If severe headache or chest pain or short of breath, loss vision or pass out, seek care immediately at hospital ED for severe blood pressure can cause serious problems or even stroke.  Please schedule a Follow-up Appointment to: Return in about 1 week (around 07/26/2019), or if symptoms worsen or fail to improve, for blood pressure.  If you have any other questions or concerns, please feel free to call the office or send a message through [place]. You may also schedule an earlier appointment if necessary.  Additionally, you may be receiving a survey about your experience at our office within a few days to 1 week by e-mail or mail. We value your feedback.  Nobie Putnam, DO [place]

## 2019-07-19 NOTE — Progress Notes (Signed)
Virtual Visit via Telephone The purpose of this virtual visit is to provide medical care while limiting exposure to the novel coronavirus (COVID19) for both patient and office staff.  Consent was obtained for phone visit:  Yes.   Answered questions that patient had about telehealth interaction:  Yes.   I discussed the limitations, risks, security and privacy concerns of performing an evaluation and management service by telephone. I also discussed with the patient that there may be a patient responsible charge related to this service. The patient expressed understanding and agreed to proceed.  Patient Location: Home Provider Location: [facility])  ---------------------------------------------------------------------- Chief Complaint  Patient presents with  . Hypertension    elevated bp reading highest 228/116 - lowest reading 185/91 over the past 2 weeks. Intermittent severe headaches, chronic left leg swollen below the knee     S: Reviewed CMA documentation. I have called patient and gathered additional HPI as follows:  CHRONIC HTN: Reports elevated BP lately in past >1 month, see above note with high reading 228/116 and lowest 180s/90s over past 2 weeks. Has side effect headaches can be severe and leg swelling. She no longer takes anti HTN medication - she does take occasional fluid pill furosemide. Current Meds - Furosemide 10m PRN - Previously on Lisinopril, HCTZ, Amlodipine - had some issues with BP med in past Denies CP, dyspnea, HA, edema, dizziness / lightheadedness  Morbid Obesity Additional discussion today, she is interested in future bariatric surgery. Chronic difficulty with abnormal weight.  PMH  Arthritis, joint pain - due for Handicap placard, she will pick up when ready  Denies any high risk travel to areas of current concern for COVID19. Denies any known or suspected exposure to person with or possibly with COVID19.  Denies any fevers,  chills, sweats, body ache, cough, shortness of breath, sinus pain or pressure, headache, abdominal pain, diarrhea  Past Medical History:  Diagnosis Date  . Anxiety   . Asthma   . Diabetes mellitus, type 2 ([place]   . GERD (gastroesophageal reflux disease)   . Hypertension   . Hypothyroidism   . Neuromuscular disorder ([place]    LEFT ARM WEAKNESS-ULNAR NERVE ISSUE  . Shortness of breath dyspnea   . Sleep apnea    NO CPAP-ONLY FINISHED HALF TEST  . Wears dentures    full upper, partial lower   Social History   Tobacco Use  . Smoking status: Former Smoker    Packs/day: 1.50    Years: 35.00    Pack years: 52.50    Types: Cigarettes    Quit date: 02/24/2014    Years since quitting: 5.4  . Smokeless tobacco: Former UNetwork engineerUse Topics  . Alcohol use: No    Alcohol/week: 0.0 standard drinks  . Drug use: No    Current Outpatient Medications:  .  amitriptyline (ELAVIL) 25 MG tablet, Take 0.5 tablets (12.5 mg total) by mouth at bedtime., Disp: 45 tablet, Rfl: 1 .  aspirin EC 81 MG tablet, Take 81 mg by mouth at bedtime., Disp: , Rfl:  .  Aspirin-Salicylamide-Caffeine (BC HEADACHE PO), Take 2 packets by mouth daily as needed (pain)., Disp: , Rfl:  .  baclofen (LIORESAL) 10 MG tablet, TAKE 1 TABLET IN MORNING, 1 TABLET IN AFTERNOON, AND 2 TABLETS IN EVENING, Disp: 360 tablet, Rfl: 1 .  Black Pepper-Turmeric (TURMERIC COMPLEX/BLACK PEPPER) 3-500 MG CAPS, Take by mouth., Disp: , Rfl:  .  Blood Glucose Monitoring Suppl (ACCU-CHEK GUIDE ME) w/Device KIT,  1 kit by Does not apply route daily. Check blood sugar up to x 2 daily as instructed, Disp: 1 kit, Rfl: 0 .  budesonide-formoterol (SYMBICORT) 160-4.5 MCG/ACT inhaler, TAKE 2 PUFFS BY MOUTH TWICE A DAY, Disp: 30.6 Inhaler, Rfl: 1 .  Calcium Carbonate-Vit D-Min (CALCIUM 600+D PLUS MINERALS PO), Take 1 tablet by mouth daily. , Disp: , Rfl:  .  chlorhexidine (PERIDEX) 0.12 % solution, 5 mLs by Mouth Rinse route daily as needed (mouth  sores). , Disp: , Rfl: 0 .  citalopram (CELEXA) 40 MG tablet, TAKE 1 TABLET BY MOUTH EVERY DAY, Disp: 90 tablet, Rfl: 1 .  Cranberry 450 MG TABS, Take by mouth., Disp: , Rfl:  .  CRANBERRY FRUIT PO, Take by mouth., Disp: , Rfl:  .  DIFFERIN 0.1 % cream, , Disp: , Rfl: 0 .  esomeprazole (NEXIUM) 40 MG capsule, TAKE 1 CAPSULE BY MOUTH EVERY DAY BEFORE BREAKFAST, Disp: 90 capsule, Rfl: 1 .  ferrous sulfate 325 (65 FE) MG tablet, Take 325 mg by mouth daily. , Disp: , Rfl:  .  fluocinonide (LIDEX) 0.05 % external solution, Apply 1 application topically daily as needed for irritation., Disp: , Rfl: 0 .  fluoruracil (CARAC) 0.5 % cream, Apply 0.5 mg topically daily as needed (skin spots). , Disp: , Rfl: 0 .  furosemide (LASIX) 20 MG tablet, Take 1 tablet (20 mg total) by mouth daily as needed. Alternate taking 1, 20 mg Lasix with 2, 20 mg tabs, every other day, Disp: 30 tablet, Rfl: 2 .  gabapentin (NEURONTIN) 400 MG capsule, Take 1 capsule (400 mg total) by mouth 3 (three) times daily., Disp: 270 capsule, Rfl: 1 .  glucose blood test strip, Check blood sugar up to 2 x daily as instructed, Disp: 100 each, Rfl: 3 .  hydrochlorothiazide (HYDRODIURIL) 25 MG tablet, Take 1 tablet (25 mg total) by mouth daily., Disp: 90 tablet, Rfl: 1 .  Iodine, Kelp, (KELP PO), Take by mouth., Disp: , Rfl:  .  ipratropium-albuterol (DUONEB) 0.5-2.5 (3) MG/3ML SOLN, Take 3 mLs by nebulization every 2 (two) hours as needed (shortness of breath)., Disp: 100 mL, Rfl: 1 .  ketoconazole (NIZORAL) 2 % shampoo, Apply 1 application topically 3 (three) times a week. , Disp: , Rfl: 0 .  Lancets (ACCU-CHEK SOFT TOUCH) lancets, Check blood sugar up to 2 x daily as instructed, Disp: 100 each, Rfl: 3 .  levothyroxine (SYNTHROID) 175 MCG tablet, TAKE 1 TABLET (175 MCG TOTAL) BY MOUTH DAILY BEFORE BREAKFAST., Disp: 90 tablet, Rfl: 1 .  meloxicam (MOBIC) 15 MG tablet, TAKE 1 TABLET (15 MG TOTAL) BY MOUTH AS NEEDED., Disp: 90 tablet, Rfl: 1 .   metFORMIN (GLUCOPHAGE) 500 MG tablet, TAKE 1 TABLET (500 MG TOTAL) BY MOUTH 2 (TWO) TIMES DAILY WITH A MEAL., Disp: 180 tablet, Rfl: 1 .  Milk Thistle 500 MG CAPS, Take 500 mg by mouth 2 (two) times daily. , Disp: , Rfl:  .  Multiple Vitamin (MULTIVITAMIN) tablet, Take 1 tablet by mouth daily., Disp: , Rfl:  .  nystatin (MYCOSTATIN/NYSTOP) powder, Apply topically 3 (three) times daily as needed., Disp: 180 g, Rfl: 2 .  nystatin cream (MYCOSTATIN), Apply 1 application topically 3 (three) times daily., Disp: 90 g, Rfl: 2 .  pravastatin (PRAVACHOL) 10 MG tablet, Take by mouth., Disp: , Rfl:  .  Probiotic Product (PRO-BIOTIC BLEND PO), Take 1 capsule by mouth daily., Disp: , Rfl:  .  SPIRIVA HANDIHALER 18 MCG inhalation capsule, INHALE 1 CAPSULE  VIA HANDIHALER ONCE DAILY AT THE SAME TIME EVERY DAY, Disp: 30 capsule, Rfl: 1 .  TURMERIC PO, Take by mouth., Disp: , Rfl:  .  VENTOLIN HFA 108 (90 Base) MCG/ACT inhaler, INHALE 2 PUFFS BY MOUTH EVERY 6 HOURS AS NEEDED FOR WHEEZING/SHORTNESS OF BREATH, Disp: 18 g, Rfl: 1  Depression screen [facility] 2/9 05/14/2019 12/06/2018 03/07/2018  Decreased Interest 0 1 3  Down, Depressed, Hopeless 1 0 3  PHQ - 2 Score _0 Altered sleeping 3 0 3  Tired, decreased energy 3 0 3  Change in appetite 0 0 0  Feeling bad or failure about yourself  2 0 0  Trouble concentrating 0 0 0  Moving slowly or fidgety/restless 0 0 0  Suicidal thoughts 0 0 0  PHQ-9 Score _1 Difficult doing work/chores Somewhat difficult Not difficult at all Somewhat difficult    GAD 7 : Generalized Anxiety Score 12/06/2018 10/01/2016  Nervous, Anxious, on Edge 0 3  Control/stop worrying 0 3  Worry too much - different things 1 2  Trouble relaxing 0 0  Restless 0 0  Easily annoyed or irritable 0 3  Afraid - awful might happen 0 2  Total GAD 7 Score 1 13  Anxiety Difficulty Not difficult at all Not difficult at all     -------------------------------------------------------------------------- O: No physical exam performed due to remote telephone encounter.  Lab results reviewed.  Recent Results (from the past 2160 hour(s))  POCT UA - Microalbumin     Status: Normal   Collection Time: 05/14/19  3:01 PM  Result Value Ref Range   Microalbumin Ur, POC negative mg/L   Creatinine, POC     Albumin/Creatinine Ratio, Urine, POC      -------------------------------------------------------------------------- A&P:  Problem List Items Addressed This Visit    Morbid obesity ([place])   Essential hypertension - Primary   Relevant Medications   hydrochlorothiazide (HYDRODIURIL) 25 MG tablet     Significantly elevated reported BP, uncontrolled Associated headache. Uncertain accuracy of home BP readings however says cuff is calibrated. Previous med failures for HTN in past   Plan:  1. START hydrochlorothiazide 65m daily now, consider add back ACEi Lisinopril as well, will defer today and add back within next several days once she can monitor BP 2. Encourage improved lifestyle - low sodium diet, regular exercise 3. Continue monitor BP outside office, bring readings to next visit, if persistently >140/90 or new symptoms notify office sooner  Follow-up as soon as possible or by Monday, if still >160/90 will add Lisinopril 10-259mas well  Strict return criteria given for severe elevated BP headache and other red flag symptoms, when to go to hospital ED if needed.  #Obesity, morbid Failed most conservative therapy diet / exercise Recommend future referral to bariatric surgery when ready to pursue  Meds ordered this encounter  Medications  . hydrochlorothiazide (HYDRODIURIL) 25 MG tablet    Sig: Take 1 tablet (25 mg total) by mouth daily.    Dispense:  90 tablet    Refill:  1     Follow-up: - Return in 1 week for HTN if uncontrolled  Patient verbalizes understanding with the above medical  recommendations including the limitation of remote medical advice.  Specific follow-up and call-back criteria were given for patient to follow-up or seek medical care more urgently if needed.   - Time spent in direct consultation with patient on phone: 14 minutes  AlNobie PutnamDOFiddletown  Medical Group 07/19/2019, 9:42 AM

## 2019-07-26 ENCOUNTER — Telehealth: Payer: Self-pay | Admitting: Family Medicine

## 2019-07-26 NOTE — Chronic Care Management (AMB) (Signed)
°  Chronic Care Management   Outreach Note  07/26/2019 Name: Erin Potter MRN: [id number] DOB: [birth year]  Referred by: [name], DO Reason for referral : Chronic Care Management (initial CCM outreach was unseccessful)   An unsuccessful telephone outreach was attempted today. The patient was referred to the case management team by for assistance with care management and care coordination.   Follow Up Plan: A HIPPA compliant phone message was left for the patient providing contact information and requesting a return call.  The care management team will reach out to the patient again over the next 7 days.  If patient returns call to provider office, please advise to call [place] at [place], [facility]  [place], [place] [address] Direct Dial: [email or web address]  Website: [email or web address]

## 2019-08-03 NOTE — Chronic Care Management (AMB) (Signed)
  Chronic Care Management   Outreach Note  08/03/2019 Name: Erin Potter MRN: [id number] DOB: Mar 17, 1961  Referred by: [name], DO Reason for referral : Chronic Care Management (initial CCM outreach was unseccessful)   A second unsuccessful telephone outreach was attempted today. The patient was referred to the case management team for assistance with care management and care coordination.   Follow Up Plan: A HIPPA compliant phone message was left for the patient providing contact information and requesting a return call.  The care management team will reach out to the patient again over the next 7 days.  If patient returns call to provider office, please advise to call [place]* at [place], [facility]  [place], [place] [address] Direct Dial: [email or web address]  Website: [email or web address]

## 2019-08-07 NOTE — Chronic Care Management (AMB) (Signed)
  Chronic Care Management   Note  08/07/2019 Name: Erin Potter MRN: [id number] DOB: 11-15-1960  Erin Potter is a 59 y.o. year old female who is a primary care patient of [name], DO. I reached out to [name] by phone today in response to a referral sent by Erin Potter's health plan.     Erin Potter was given information about Chronic Care Management services today including:  1. CCM service includes personalized support from designated clinical staff supervised by her physician, including individualized plan of care and coordination with other care providers 2. 24/7 contact phone numbers for assistance for urgent and routine care needs. 3. Service will only be billed when office clinical staff spend 20 minutes or more in a month to coordinate care. 4. Only one practitioner may furnish and bill the service in a calendar month. 5. The patient may stop CCM services at any time (effective at the end of the month) by phone call to the office staff. 6. The patient will be responsible for cost sharing (co-pay) of up to 20% of the service fee (after annual deductible is met).  Patient agreed to services and verbal consent obtained.   Follow up plan: Telephone appointment with care management team member scheduled for:08/08/2019  [place], [place], [place] [address] Direct Dial: [email or web address]  Website: [email or web address]

## 2019-08-08 ENCOUNTER — Ambulatory Visit: Payer: Self-pay | Admitting: Pharmacist

## 2019-08-08 ENCOUNTER — Telehealth: Payer: Medicare Other

## 2019-08-08 NOTE — Chronic Care Management (AMB) (Signed)
  Chronic Care Management   Follow Up Note   08/08/2019 Name: Erin Potter MRN: [id number] DOB: 1961-06-08  Referred by: [name], DO Reason for referral : Chronic Care Management (Initial Patient Outreach Call)   Erin Potter is a 59 y.o. year old female who is a primary care patient of [name], DO. The CCM team was consulted for assistance with chronic disease management and care coordination needs by patient's health plan.    I reached out to Erin Potter by phone today as scheduled by Care Guide.  Was unable to reach patient via telephone today and have left HIPAA compliant voicemail asking patient to return my call.    Plan  The care management team will reach out to the patient again over the next 30 days.   Harlow Asa, PharmD, [place] [employer] [phone]

## 2019-08-14 ENCOUNTER — Ambulatory Visit: Payer: Self-pay | Admitting: Pharmacist

## 2019-08-14 DIAGNOSIS — I10 Essential (primary) hypertension: Secondary | ICD-10-CM

## 2019-08-14 NOTE — Patient Instructions (Signed)
Thank you allowing the Chronic Care Management Team to be a part of your care! It was a pleasure speaking with you today!     CCM (Chronic Care Management) Team    Noreene Larsson RN, MSN, CCM Nurse Care Coordinator  [phone]   Harlow Asa PharmD  Clinical Pharmacist  [phone]   Eula Fried LCSW Clinical Social Worker [phone]  Visit Information  Goals Addressed            This Visit's Progress   . PharmD - Medication Review       Current Barriers:  . Chronic Disease Management support, education, and care coordination needs related to HTN, COPD, and DMII  Pharmacist Clinical Goal(s):  Marland Kitchen Over the next 30 days, patient will work with CM Pharmacist to review medications and address needs related to medications that are identified.  Interventions: . Reschedule appointment to complete medication review  Patient Self Care Activities:  . To call provider office for new concerns or questions  Initial goal documentation        The patient verbalized understanding of instructions provided today and declined a print copy of patient instruction materials.   Telephone follow up appointment with care management team member scheduled for: 2/5 at 1:30 pm  Harlow Asa, PharmD, [place] [phone]

## 2019-08-14 NOTE — Chronic Care Management (AMB) (Addendum)
Chronic Care Management   Follow Up Note   08/14/2019 Name: Legna Mausolf MRN: [id number] DOB: 09-Mar-1961  Referred by: [name], DO Reason for referral : Chronic Care Management (Initial Patient Outreach Call)   Dorethy Tomey is a 59 y.o. year old female who is a primary care patient of [name], DO. The CCM team was consulted for assistance with chronic disease management and care coordination needs by patient's health plan.    Receive voicemail from Ms. Sherlene Shams requesting a call back.  I reached out to [name] by phone today.   Review of patient status, including review of consultants reports, relevant laboratory and other test results, and collaboration with appropriate care team members and the patient's provider was performed as part of comprehensive patient evaluation and provision of chronic care management services.     Outpatient Encounter Medications as of 08/14/2019  Medication Sig  . amitriptyline (ELAVIL) 25 MG tablet Take 0.5 tablets (12.5 mg total) by mouth at bedtime.  Marland Kitchen aspirin EC 81 MG tablet Take 81 mg by mouth at bedtime.  . Aspirin-Salicylamide-Caffeine (BC HEADACHE PO) Take 2 packets by mouth daily as needed (pain).  . baclofen (LIORESAL) 10 MG tablet TAKE 1 TABLET IN MORNING, 1 TABLET IN AFTERNOON, AND 2 TABLETS IN EVENING  . Black Pepper-Turmeric (TURMERIC COMPLEX/BLACK PEPPER) 3-500 MG CAPS Take by mouth.  . Blood Glucose Monitoring Suppl (ACCU-CHEK GUIDE ME) w/Device KIT 1 kit by Does not apply route daily. Check blood sugar up to x 2 daily as instructed  . budesonide-formoterol (SYMBICORT) 160-4.5 MCG/ACT inhaler TAKE 2 PUFFS BY MOUTH TWICE A DAY  . Calcium Carbonate-Vit D-Min (CALCIUM 600+D PLUS MINERALS PO) Take 1 tablet by mouth daily.   . chlorhexidine (PERIDEX) 0.12 % solution 5 mLs by Mouth Rinse route daily as needed (mouth sores).   . citalopram (CELEXA) 40 MG tablet TAKE 1 TABLET BY  MOUTH EVERY DAY  . Cranberry 450 MG TABS Take by mouth.  Drusilla [place]anner FRUIT PO Take by mouth.  Marland Kitchen DIFFERIN 0.1 % cream   . esomeprazole (NEXIUM) 40 MG capsule TAKE 1 CAPSULE BY MOUTH EVERY DAY BEFORE BREAKFAST  . ferrous sulfate 325 (65 FE) MG tablet Take 325 mg by mouth daily.   . fluocinonide (LIDEX) 0.05 % external solution Apply 1 application topically daily as needed for irritation.  . fluoruracil (CARAC) 0.5 % cream Apply 0.5 mg topically daily as needed (skin spots).   . furosemide (LASIX) 20 MG tablet Take 1 tablet (20 mg total) by mouth daily as needed. Alternate taking 1, 20 mg Lasix with 2, 20 mg tabs, every other day  . gabapentin (NEURONTIN) 400 MG capsule TAKE 1 CAPSULE (400 MG TOTAL) BY MOUTH 3 (THREE) TIMES DAILY.  Marland Kitchen glucose blood test strip Check blood sugar up to 2 x daily as instructed  . hydrochlorothiazide (HYDRODIURIL) 25 MG tablet Take 1 tablet (25 mg total) by mouth daily.  Marland Kitchen ipratropium-albuterol (DUONEB) 0.5-2.5 (3) MG/3ML SOLN Take 3 mLs by nebulization every 2 (two) hours as needed (shortness of breath).  Marland Kitchen ketoconazole (NIZORAL) 2 % shampoo Apply 1 application topically 3 (three) times a week.   . Lancets (ACCU-CHEK SOFT TOUCH) lancets Check blood sugar up to 2 x daily as instructed  . levothyroxine (SYNTHROID) 175 MCG tablet TAKE 1 TABLET (175 MCG TOTAL) BY MOUTH DAILY BEFORE BREAKFAST.  . meloxicam (MOBIC) 15 MG tablet TAKE 1 TABLET (15 MG TOTAL) BY MOUTH AS NEEDED.  . metFORMIN (  GLUCOPHAGE) 500 MG tablet TAKE 1 TABLET (500 MG TOTAL) BY MOUTH 2 (TWO) TIMES DAILY WITH A MEAL.  . Milk Thistle 500 MG CAPS Take 500 mg by mouth 2 (two) times daily.   . Multiple Vitamin (MULTIVITAMIN) tablet Take 1 tablet by mouth daily.  Marland Kitchen nystatin (MYCOSTATIN/NYSTOP) powder Apply topically 3 (three) times daily as needed.  . nystatin cream (MYCOSTATIN) Apply 1 application topically 3 (three) times daily.  . Probiotic Product (PRO-BIOTIC BLEND PO) Take 1 capsule by mouth daily.  Marland Kitchen  SPIRIVA HANDIHALER 18 MCG inhalation capsule INHALE 1 CAPSULE VIA HANDIHALER ONCE DAILY AT THE SAME TIME EVERY DAY  . TURMERIC PO Take by mouth.  . VENTOLIN HFA 108 (90 Base) MCG/ACT inhaler INHALE 2 PUFFS BY MOUTH EVERY 6 HOURS AS NEEDED FOR WHEEZING/SHORTNESS OF BREATH   No facility-administered encounter medications on file as of 08/14/2019.    Goals Addressed            This Visit's Progress   . PharmD - Medication Review       Current Barriers:  . Chronic Disease Management support, education, and care coordination needs related to HTN, COPD, and DMII  Pharmacist Clinical Goal(s):  Marland Kitchen Over the next 30 days, patient will work with CM Pharmacist to review medications and address needs related to medications that are identified.  Interventions: . Perform chart review . Reschedule appointment to complete medication review  Patient Self Care Activities:  . To call provider office for new concerns or questions  Initial goal documentation        Plan  Telephone follow up appointment with care management team member scheduled for: 2/5 at 1:30 pm  Harlow Asa, PharmD, [place] [phone]

## 2019-08-17 ENCOUNTER — Telehealth: Payer: Medicare Other

## 2019-08-20 ENCOUNTER — Telehealth: Payer: Self-pay | Admitting: Family Medicine

## 2019-08-20 NOTE — Telephone Encounter (Signed)
I left a message asking the patient to call and schedule virtual AWV-I with Tiffany. VDM (DD)

## 2019-08-22 ENCOUNTER — Ambulatory Visit (INDEPENDENT_AMBULATORY_CARE_PROVIDER_SITE_OTHER): Payer: Medicare Other | Admitting: Pharmacist

## 2019-08-22 DIAGNOSIS — F3341 Major depressive disorder, recurrent, in partial remission: Secondary | ICD-10-CM | POA: Diagnosis not present

## 2019-08-22 DIAGNOSIS — E785 Hyperlipidemia, unspecified: Secondary | ICD-10-CM

## 2019-08-22 DIAGNOSIS — I10 Essential (primary) hypertension: Secondary | ICD-10-CM

## 2019-08-22 DIAGNOSIS — E034 Atrophy of thyroid (acquired): Secondary | ICD-10-CM

## 2019-08-22 DIAGNOSIS — E1142 Type 2 diabetes mellitus with diabetic polyneuropathy: Secondary | ICD-10-CM

## 2019-08-22 DIAGNOSIS — J432 Centrilobular emphysema: Secondary | ICD-10-CM | POA: Diagnosis not present

## 2019-08-22 DIAGNOSIS — E1169 Type 2 diabetes mellitus with other specified complication: Secondary | ICD-10-CM

## 2019-08-22 NOTE — Chronic Care Management (AMB) (Signed)
Chronic Care Management   Note  08/22/2019 Name: Erin Potter MRN: [id number] DOB: [birth year]   Subjective:   Erin Potter is a 59 y.o. year old female who is a primary care patient of [name], DO. The CM team was consulted for assistance with chronic disease management and care coordination. Erin Potter has a past medical history including but not limited to hypertension, COPD, type 2 diabetes mellitus, chronic pain, OSA, morbid obesity and GERD.  I reached out to [name] by phone today.   Review of patient status, including review of consultants reports, laboratory and other test data, was performed as part of comprehensive evaluation and provision of chronic care management services.    Objective:  Lab Results  Component Value Date   TSH 0.41 04/11/2019    Lab Results  Component Value Date   CREATININE 0.60 04/11/2019   CREATININE 0.65 03/07/2018   CREATININE 0.72 09/12/2017    Lab Results  Component Value Date   HGBA1C 6.5 (H) 04/11/2019       Component Value Date/Time   CHOL 177 04/11/2019 0854   CHOL 169 05/30/2015 1420   TRIG 124 04/11/2019 0854   HDL 45 (L) 04/11/2019 0854   HDL 43 05/30/2015 1420   CHOLHDL 3.9 04/11/2019 0854   VLDL 26 06/15/2016 0001   LDLCALC 109 (H) 04/11/2019 0854    ASCVD Risk: The 10-year ASCVD risk score Mikey Bussing DC Jr., et al., 2013) is: 12.8%   Values used to calculate the score:     Age: 82 years     Sex: Female     Is Non-Hispanic African American: No     Diabetic: Yes     Tobacco smoker: No     Systolic Blood Pressure: 419 mmHg     Is BP treated: Yes     HDL Cholesterol: 45 mg/dL     Total Cholesterol: 177 mg/dL    BP Readings from Last 3 Encounters:  05/14/19 (!) 173/79  04/11/19 (!) 179/83  05/29/18 (!) 148/85    Allergies  Allergen Reactions  . Clindamycin Hives  . Pseudoeph-Doxylamine-Dm-Apap Hives  . 2,4-D Dimethylamine (Amisol) Other (See Comments)   Unknown to pt  . Clindamycin Hcl   . Guaifenesin Other (See Comments)  . Guaifenesin & Derivatives   . Tessalon [Benzonatate] Itching    Medications Reviewed Today    Reviewed by Vella Raring, [place] (Pharmacist) on 08/22/19 at [place] List Status: <None>  Medication Order Taking? Sig Documenting Provider Last Dose Status Informant  amitriptyline (ELAVIL) 25 MG tablet [id number] Yes Take 0.5 tablets (12.5 mg total) by mouth at bedtime. [name], DO Taking Active            Med Note [facility], Marek Nghiem A   Wed Aug 22, 2019  3:14 PM) Taking 1/4 - 1/2 at bedtime as needed  aspirin EC 81 MG tablet [id number]  Take 81 mg by mouth at bedtime. [provider]  Active Self  Aspirin-Salicylamide-Caffeine (BC HEADACHE PO) [id number] Yes Take 2 packets by mouth daily as needed (pain). [provider] Taking Active Self  baclofen (LIORESAL) 10 MG tablet [id number] Yes TAKE 1 TABLET IN MORNING, 1 TABLET IN AFTERNOON, AND 2 TABLETS IN EVENING Karamalegos, Devonne Doughty, DO Taking Active   Black Pepper-Turmeric (TURMERIC COMPLEX/BLACK PEPPER) 3-500 MG CAPS [id number] Yes Take by mouth. [provider] Taking Active   Blood Glucose Monitoring Suppl (ACCU-CHEK GUIDE ME) w/Device KIT [id number]  1 kit  by Does not apply route daily. Check blood sugar up to x 2 daily as instructed [name], DO  Active   budesonide-formoterol [facility]) 160-4.5 MCG/ACT inhaler [id number] Yes TAKE 2 PUFFS BY MOUTH TWICE A DAY Karamalegos, Devonne Doughty, DO Taking Active   Calcium Carbonate-Vit D-Min (CALCIUM 600+D PLUS MINERALS PO) [id number] Yes Take 1 tablet by mouth daily.  [provider] Taking Active Self  chlorhexidine (PERIDEX) 0.12 % solution [id number] Yes 5 mLs by Mouth Rinse route daily as needed (mouth sores).  [provider] Taking Active Self  citalopram (CELEXA) 40 MG tablet [id number] Yes TAKE 1 TABLET BY MOUTH EVERY DAY [name],  DO Taking Active   Cranberry 450 MG TABS [id number] No Take by mouth. [provider] Not Taking Active   esomeprazole (NEXIUM) 40 MG capsule [id number] Yes TAKE 1 CAPSULE BY MOUTH EVERY DAY BEFORE BREAKFAST Karamalegos, Devonne Doughty, DO Taking Active   ferrous sulfate 325 (65 FE) MG tablet [id number] No Take 325 mg by mouth daily.  [provider] Not Taking Active Self  fluoruracil [facility]) 0.5 % cream [id number] Yes Apply 0.5 mg topically daily as needed (skin spots).  [provider] Taking Active Self           Med Note Jilda Roche A   Mon Jan 31, 2017  2:43 PM)    furosemide (LASIX) 20 MG tablet [id number] No Take 1 tablet (20 mg total) by mouth daily as needed. Alternate taking 1, 20 mg Lasix with 2, 20 mg tabs, every other day [name], DO Not Taking Active   gabapentin (NEURONTIN) 400 MG capsule [id number] Yes TAKE 1 CAPSULE (400 MG TOTAL) BY MOUTH 3 (THREE) TIMES DAILY. [name], DO Taking Active   glucose blood test strip [id number]  Check blood sugar up to 2 x daily as instructed [name], DO  Active   hydrochlorothiazide (HYDRODIURIL) 25 MG tablet [id number] Yes Take 1 tablet (25 mg total) by mouth daily. [name], DO Taking Active   ipratropium-albuterol (DUONEB) 0.5-2.5 (3) MG/3ML SOLN [id number] Yes Take 3 mLs by nebulization every 2 (two) hours as needed (shortness of breath). [name], DO Taking Active   ketoconazole (NIZORAL) 2 % shampoo [id number] Yes Apply 1 application topically 3 (three) times a week.  [provider] Taking Active Self  Lancets (ACCU-CHEK SOFT TOUCH) lancets [id number]  Check blood sugar up to 2 x daily as instructed [name], DO  Active   levothyroxine (SYNTHROID) 175 MCG tablet [id number] Yes TAKE 1 TABLET (175 MCG TOTAL) BY MOUTH DAILY BEFORE BREAKFAST. [name], DO Taking Active   meloxicam (MOBIC) 15 MG tablet [id number]  Yes TAKE 1 TABLET (15 MG TOTAL) BY MOUTH AS NEEDED. [name], DO Taking Active   metFORMIN (GLUCOPHAGE) 500 MG tablet [id number] Yes TAKE 1 TABLET (500 MG TOTAL) BY MOUTH 2 (TWO) TIMES DAILY WITH A MEAL.  Patient taking differently: Take 500 mg by mouth daily with breakfast.    [name], DO Taking Active   Milk Thistle 500 MG CAPS [id number] Yes Take 500 mg by mouth 2 (two) times daily.  [provider] Taking Active Self           Med Note Lesly Dukes Jan 31, 2017  2:43 PM)    Multiple Vitamin (MULTIVITAMIN) tablet [id number] Yes Take 1 tablet by mouth daily. [provider] Taking Active Self  Med Note Jilda Roche A   Mon Jan 31, 2017  2:43 PM)    nystatin (MYCOSTATIN/NYSTOP) powder [id number] Yes Apply topically 3 (three) times daily as needed. [name], DO Taking Active   nystatin cream (MYCOSTATIN) [id number] Yes Apply 1 application topically 3 (three) times daily. [name], DO Taking Active   Probiotic Product (PRO-BIOTIC BLEND PO) [id number] Yes Take 1 capsule by mouth daily. [provider] Taking Active Self           Med Note Lesly Dukes Jan 31, 2017  2:43 PM)    SPIRIVA HANDIHALER 18 MCG inhalation capsule [id number] Yes INHALE 1 CAPSULE VIA HANDIHALER ONCE DAILY AT THE SAME TIME EVERY DAY [name], DO Taking Active         Discontinued 73/71/06 2694 (Duplicate)   VENTOLIN HFA 108 (90 Base) MCG/ACT inhaler [id number] Yes INHALE 2 PUFFS BY MOUTH EVERY 6 HOURS AS NEEDED FOR WHEEZING/SHORTNESS OF BREATH [name], DO Taking Active            Assessment:   Goals Addressed            This Visit's Progress   . PharmD - Medication Review       Current Barriers:  . Chronic Disease Management support, education, and care coordination needs related to HTN, COPD, and DMII  Pharmacist Clinical Goal(s):  Marland Kitchen Over the next 30 days, patient  will work with CM Pharmacist to review medications and address needs related to medications that are identified.  Interventions: . Comprehensive medication review performed; medication list updated in electronic medical record o Ms. Grant reports using a weekly pillbox to organize her medications o Reports taking levothyroxine as directed in morning 30 minutes before food or other medications. - Counsel patient to take multivitamin and other calcium- or iron-containing products 4 hours after levothyroxine o Caution patient about increased risk of dizziness/sedation with gabapentin, amitriptyline and baclofen, particularly when taken in combination o Caution patient about increased risk of bleeding with taking BC powder in combination with daily aspirin. Advise patient against taking BC powder in combination with meloxicam.  o Reports has decreased her metformin to 500 mg once daily with breakfast. Reports previously discussed with PCP hope to taper down dose. . Patient denies taking pravastatin due to belief that statin associated with kidney damage. o Per 9/30 office visit note from Cardiologist, Dr. Ubaldo Glassing, provider started pravastatin 10 mg daily. o States considering starting red yeast rice instead for cholesterol lowering. o Provide counseling about pravastatin as agent for LDL lowering and on safety concerns o Advise patient that red yeast rice produces its LDL lowering effect through mechanism similar to statins. Recommend prescription statin over use of red yeast rice supplement due to lack of clinical data, variable drug bioavailability, and potential for toxic effects from contaminants with the supplement. . Reports taking turmeric supplement for joint pain.  o Based on data from [facility] [place], there is insufficient evidence to suggest that turmeric reduces the joint pain.  o However, counsel that use may increase risk of bleeding when combined with antiplatelet medication,  such as aspirin. o Advise against the use of this product. . Reports taking milk thistle for fatty liver disease o Based on data from [employer] [facility], there is insufficient evidence to suggest that milk thistle improves liver function in fatty liver disease. . Caution patient about limited data with supplements, as unlike prescription medications, these do not undergo  FDA approval and there may be insufficient data to confirm safety, efficacy or potential for interactions. In addition, these supplements are not required to tested for contaminants or to verify their content of active ingredient. Myles Rosenthal on importance of blood pressure monitoring and control o Reports taking: HCTZ 25 mg daily in morning o Reports having both an upper arm and wrist monitor at home. Discuss limitations of wrist monitor, encourage patient to use upper arm monitor o Reports has been monitoring regularly but denies keeping log. Reports home readings running ~140/80. o Reiterate instruction from PCP for follow up including to notify office if BP readings persistently >140/90 or new symptoms . Will mail patient a blood pressure log and education on monitoring. . Will collaborate with PCP regarding potential for drug interaction between citalopram and esomeprazole o Patient currently taking citalopram 40 mg daily, while also on daily esomeprazole o Note as a CYP2C19 inhibitor, esomeprazole may increase the serum concentration of citalopram. Per product labeling, citalopram 20 mg/day is maximum dose recommended when taken with esomeprazole  Patient Self Care Activities:  . To call provider office for new concerns or questions . To check BP regularly and keep record  Please see past updates related to this goal by clicking on the "Past Updates" button in the selected goal         Plan:  The patient has been provided with contact information for CM Pharmacist and has been advised to call with any  medication related questions or concerns.   Harlow Asa, PharmD, [place] [employer] [phone]

## 2019-08-23 ENCOUNTER — Ambulatory Visit: Payer: Self-pay | Admitting: Pharmacist

## 2019-08-23 ENCOUNTER — Other Ambulatory Visit: Payer: Self-pay | Admitting: Family Medicine

## 2019-08-23 DIAGNOSIS — F3341 Major depressive disorder, recurrent, in partial remission: Secondary | ICD-10-CM

## 2019-08-23 DIAGNOSIS — K219 Gastro-esophageal reflux disease without esophagitis: Secondary | ICD-10-CM

## 2019-08-23 MED ORDER — PANTOPRAZOLE SODIUM 40 MG PO TBEC: 40.0000 mg | DELAYED_RELEASE_TABLET | Freq: Every day | ORAL | 1 refills | Status: DC

## 2019-08-23 NOTE — Patient Instructions (Signed)
Thank you allowing the Chronic Care Management Team to be a part of your care! It was a pleasure speaking with you today!     CCM (Chronic Care Management) Team    Erin Larsson RN, MSN, CCM Nurse Care Coordinator  [phone]   Erin Potter  Clinical Pharmacist  [phone]   Erin Fried LCSW Clinical Social Worker [phone]  Visit Information  Goals Addressed            This Visit's Progress   . Potter - Medication Review       Current Barriers:  . Chronic Disease Management support, education, and care coordination needs related to HTN, COPD, and DMII  Pharmacist Clinical Goal(s):  Erin Potter Over the next 30 days, patient will work with CM Pharmacist to review medications and address needs related to medications that are identified.  Interventions: . Comprehensive medication review performed; medication list updated in electronic medical record o [name] reports using a weekly pillbox to organize her medications o Reports taking levothyroxine as directed in morning 30 minutes before food or other medications. - Counsel patient to take multivitamin and other calcium- or iron-containing products 4 hours after levothyroxine o Caution patient about increased risk of dizziness/sedation with gabapentin, amitriptyline and baclofen, particularly when taken in combination o Caution patient about increased risk of bleeding with taking BC powder in combination with daily aspirin. Advise patient against taking BC powder in combination with meloxicam.  o Reports has decreased her metformin to 500 mg once daily with breakfast. Reports previously discussed with PCP hope to taper down dose. . Patient denies taking pravastatin due to belief that statin associated with kidney damage. o Per 9/30 office visit note from Cardiologist, Dr. Ubaldo Potter, provider started pravastatin 10 mg daily. o States considering starting red yeast rice instead for cholesterol lowering. o Provide  counseling about pravastatin as agent for LDL lowering and on safety concerns o Advise patient that red yeast rice produces its LDL lowering effect through mechanism similar to statins. Recommend prescription statin over use of red yeast rice supplement due to lack of clinical data, variable drug bioavailability, and potential for toxic effects from contaminants with the supplement. . Reports taking turmeric supplement for joint pain.  o Based on data from [facility] [place], there is insufficient evidence to suggest that turmeric reduces the joint pain.  o However, counsel that use may increase risk of bleeding when combined with antiplatelet medication, such as aspirin. o Advise against the use of this product. . Reports taking milk thistle for fatty liver disease o Based on data from [facility] [facility], there is insufficient evidence to suggest that milk thistle improves liver function in fatty liver disease. . Caution patient about limited data with supplements, as unlike prescription medications, these do not undergo FDA approval and there may be insufficient data to confirm safety, efficacy or potential for interactions. In addition, these supplements are not required to tested for contaminants or to verify their content of active ingredient. Erin Potter on importance of blood pressure monitoring and control o Reports taking: HCTZ 25 mg daily in morning o Reports having both an upper arm and wrist monitor at home. Discuss limitations of wrist monitor, encourage patient to use upper arm monitor o Reports has been monitoring regularly but denies keeping log. Reports home readings running ~140/80. o Reiterate instruction from PCP for follow up including to notify office if BP readings persistently >140/90 or new symptoms . Will mail patient a blood pressure  log and education on monitoring. o Will collaborate with PCP   Patient Self Care Activities:  . To call provider office for  new concerns or questions . To check BP regularly and keep record  Please see past updates related to this goal by clicking on the "Past Updates" button in the selected goal         The patient verbalized understanding of instructions provided today and declined a print copy of patient instruction materials.   The patient has been provided with contact information for the care management team and has been advised to call with any health related questions or concerns.   Erin Potter, Potter, [place] [employer] [phone]

## 2019-08-23 NOTE — Chronic Care Management (AMB) (Signed)
Chronic Care Management   Follow Up Note   08/23/2019 Name: Erin Potter MRN: [id number] DOB: 1961/02/16  Referred by: Erin Hauser, DO Reason for referral : Chronic Care Management (Patient Phone Call)   Erin Potter is a 59 y.o. year old female who is a primary care patient of Erin Hauser, DO. The CCM team was consulted for assistance with chronic disease management and care coordination needs.  Erin Potter has a past medical history including but not limited to hypertension, COPD, type 2 diabetes mellitus, chronic pain, OSA, morbid obesity and GERD.  Was unable to reach patient via telephone today and have left HIPAA compliant voicemail asking patient to return my call.  Review of patient status, including review of consultants reports, relevant laboratory and other test results, and collaboration with appropriate care team members and the patient's provider was performed as part of comprehensive patient evaluation and provision of chronic care management services.      Outpatient Encounter Medications as of 08/23/2019  Medication Sig Note  . amitriptyline (ELAVIL) 25 MG tablet Take 0.5 tablets (12.5 mg total) by mouth at bedtime. 08/22/2019: Taking 1/4 - 1/2 at bedtime as needed  . aspirin EC 81 MG tablet Take 81 mg by mouth at bedtime.   . Aspirin-Salicylamide-Caffeine (BC HEADACHE PO) Take 2 packets by mouth daily as needed (pain).   . baclofen (LIORESAL) 10 MG tablet TAKE 1 TABLET IN MORNING, 1 TABLET IN AFTERNOON, AND 2 TABLETS IN EVENING   . Black Pepper-Turmeric (TURMERIC COMPLEX/BLACK PEPPER) 3-500 MG CAPS Take by mouth.   . Blood Glucose Monitoring Suppl (ACCU-CHEK GUIDE ME) w/Device KIT 1 kit by Does not apply route daily. Check blood sugar up to x 2 daily as instructed   . budesonide-formoterol (SYMBICORT) 160-4.5 MCG/ACT inhaler TAKE 2 PUFFS BY MOUTH TWICE A DAY   . Calcium Carbonate-Vit D-Min (CALCIUM 600+D PLUS MINERALS PO) Take  1 tablet by mouth daily.    . chlorhexidine (PERIDEX) 0.12 % solution 5 mLs by Mouth Rinse route daily as needed (mouth sores).    . citalopram (CELEXA) 40 MG tablet TAKE 1 TABLET BY MOUTH EVERY DAY   . Cranberry 450 MG TABS Take by mouth.   . ferrous sulfate 325 (65 FE) MG tablet Take 325 mg by mouth daily.    . fluoruracil (CARAC) 0.5 % cream Apply 0.5 mg topically daily as needed (skin spots).    . furosemide (LASIX) 20 MG tablet Take 1 tablet (20 mg total) by mouth daily as needed. Alternate taking 1, 20 mg Lasix with 2, 20 mg tabs, every other day   . gabapentin (NEURONTIN) 400 MG capsule TAKE 1 CAPSULE (400 MG TOTAL) BY MOUTH 3 (THREE) TIMES DAILY.   Marland Kitchen glucose blood test strip Check blood sugar up to 2 x daily as instructed   . hydrochlorothiazide (HYDRODIURIL) 25 MG tablet Take 1 tablet (25 mg total) by mouth daily.   Marland Kitchen ipratropium-albuterol (DUONEB) 0.5-2.5 (3) MG/3ML SOLN Take 3 mLs by nebulization every 2 (two) hours as needed (shortness of breath).   Marland Kitchen ketoconazole (NIZORAL) 2 % shampoo Apply 1 application topically 3 (three) times a week.    . Lancets (ACCU-CHEK SOFT TOUCH) lancets Check blood sugar up to 2 x daily as instructed   . levothyroxine (SYNTHROID) 175 MCG tablet TAKE 1 TABLET (175 MCG TOTAL) BY MOUTH DAILY BEFORE BREAKFAST.   . Magnesium 250 MG TABS Take 1 tablet by mouth daily.   . meloxicam (MOBIC) 15 MG  tablet TAKE 1 TABLET (15 MG TOTAL) BY MOUTH AS NEEDED.   . metFORMIN (GLUCOPHAGE) 500 MG tablet TAKE 1 TABLET (500 MG TOTAL) BY MOUTH 2 (TWO) TIMES DAILY WITH A MEAL. (Patient taking differently: Take 500 mg by mouth daily with breakfast. )   . Milk Thistle 500 MG CAPS Take 500 mg by mouth 2 (two) times daily.    . Multiple Vitamin (MULTIVITAMIN) tablet Take 1 tablet by mouth daily.   Marland Kitchen nystatin (MYCOSTATIN/NYSTOP) powder Apply topically 3 (three) times daily as needed.   . nystatin cream (MYCOSTATIN) Apply 1 application topically 3 (three) times daily.   . pantoprazole  (PROTONIX) 40 MG tablet Take 1 tablet (40 mg total) by mouth daily before breakfast.   . Probiotic Product (PRO-BIOTIC BLEND PO) Take 1 capsule by mouth daily.   Marland Kitchen SPIRIVA HANDIHALER 18 MCG inhalation capsule INHALE 1 CAPSULE VIA HANDIHALER ONCE DAILY AT THE SAME TIME EVERY DAY   . VENTOLIN HFA 108 (90 Base) MCG/ACT inhaler INHALE 2 PUFFS BY MOUTH EVERY 6 HOURS AS NEEDED FOR WHEEZING/SHORTNESS OF BREATH    No facility-administered encounter medications on file as of 08/23/2019.    Goals Addressed            This Visit's Progress   . PharmD - Medication Review       Current Barriers:  . Chronic Disease Management support, education, and care coordination needs related to HTN, COPD, and DMII  Pharmacist Clinical Goal(s):  Marland Kitchen Over the next 30 days, patient will work with CM Pharmacist to review medications and address needs related to medications that are identified.  Interventions: . Collaborated with PCP regarding potential for drug interaction between citalopram and esomeprazole o Patient currently taking citalopram 40 mg daily, while also on daily esomeprazole o Note as a CYP2C19 inhibitor, esomeprazole may increase the serum concentration of citalopram. Product labeling, recommends max of citalopram 20 mg/day when taken with esomeprazole o Recommended switch of esomeprazole to pantoprazole to decrease risk of this interaction . Provider agreeable to switch of patient's PPI therapy to pantoprazole for decreased risk of interaction. o Esomeprazole prescription discontinued; switched to pantoprazole, new Rx sent. . Call to patient to provide this update. Unable to reach patient.  Patient Self Care Activities:  . To call provider office for new concerns or questions . To check BP regularly and keep record  Please see past updates related to this goal by clicking on the "Past Updates" button in the selected goal         Plan  The care management team will reach out to the patient  again over the next 7 days.   Harlow Asa, PharmD, [place] [employer] [phone]

## 2019-08-27 ENCOUNTER — Ambulatory Visit: Payer: Self-pay | Admitting: Pharmacist

## 2019-08-27 DIAGNOSIS — K219 Gastro-esophageal reflux disease without esophagitis: Secondary | ICD-10-CM

## 2019-08-27 DIAGNOSIS — J432 Centrilobular emphysema: Secondary | ICD-10-CM

## 2019-08-27 NOTE — Chronic Care Management (AMB) (Signed)
Chronic Care Management   Follow Up Note   08/27/2019 Name: Erin Potter MRN: [id number] DOB: 01-19-1961  Referred by: [name], DO Reason for referral : Chronic Care Management (Patient Phone Call)   Erin Potter is a 59 y.o. year old female who is a primary care patient of [name], DO. The CCM team was consulted for assistance with chronic disease management and care coordination needs.  Erin Potter has a past medical history including but not limited to hypertension, COPD, type 2 diabetes mellitus, chronic pain, OSA, morbid obesity and GERD.  I reached out to Erin Potter by phone today.   Review of patient status, including review of consultants reports, relevant laboratory and other test results, and collaboration with appropriate care team members and the patient's provider was performed as part of comprehensive patient evaluation and provision of chronic care management services.     Outpatient Encounter Medications as of 08/27/2019  Medication Sig Note  . albuterol (PROVENTIL) (2.5 MG/3ML) 0.083% nebulizer solution Take 2.5 mg by nebulization every 6 (six) hours as needed for wheezing or shortness of breath.   Marland Kitchen amitriptyline (ELAVIL) 25 MG tablet Take 0.5 tablets (12.5 mg total) by mouth at bedtime. 08/22/2019: Taking 1/4 - 1/2 at bedtime as needed  . aspirin EC 81 MG tablet Take 81 mg by mouth at bedtime.   . Aspirin-Salicylamide-Caffeine (BC HEADACHE PO) Take 2 packets by mouth daily as needed (pain).   . baclofen (LIORESAL) 10 MG tablet TAKE 1 TABLET IN MORNING, 1 TABLET IN AFTERNOON, AND 2 TABLETS IN EVENING   . Black Pepper-Turmeric (TURMERIC COMPLEX/BLACK PEPPER) 3-500 MG CAPS Take by mouth.   . Blood Glucose Monitoring Suppl (ACCU-CHEK GUIDE ME) w/Device KIT 1 kit by Does not apply route daily. Check blood sugar up to x 2 daily as instructed   . budesonide-formoterol (SYMBICORT) 160-4.5 MCG/ACT inhaler TAKE 2  PUFFS BY MOUTH TWICE A DAY   . Calcium Carbonate-Vit D-Min (CALCIUM 600+D PLUS MINERALS PO) Take 1 tablet by mouth daily.    . chlorhexidine (PERIDEX) 0.12 % solution 5 mLs by Mouth Rinse route daily as needed (mouth sores).    . citalopram (CELEXA) 40 MG tablet TAKE 1 TABLET BY MOUTH EVERY DAY   . Cranberry 450 MG TABS Take by mouth.   . ferrous sulfate 325 (65 FE) MG tablet Take 325 mg by mouth daily.    . fluoruracil (CARAC) 0.5 % cream Apply 0.5 mg topically daily as needed (skin spots).    . furosemide (LASIX) 20 MG tablet Take 1 tablet (20 mg total) by mouth daily as needed. Alternate taking 1, 20 mg Lasix with 2, 20 mg tabs, every other day   . gabapentin (NEURONTIN) 400 MG capsule TAKE 1 CAPSULE (400 MG TOTAL) BY MOUTH 3 (THREE) TIMES DAILY.   Marland Kitchen glucose blood test strip Check blood sugar up to 2 x daily as instructed   . hydrochlorothiazide (HYDRODIURIL) 25 MG tablet Take 1 tablet (25 mg total) by mouth daily.   Marland Kitchen ketoconazole (NIZORAL) 2 % shampoo Apply 1 application topically 3 (three) times a week.    . Lancets (ACCU-CHEK SOFT TOUCH) lancets Check blood sugar up to 2 x daily as instructed   . levothyroxine (SYNTHROID) 175 MCG tablet TAKE 1 TABLET (175 MCG TOTAL) BY MOUTH DAILY BEFORE BREAKFAST.   . Magnesium 250 MG TABS Take 1 tablet by mouth daily.   . meloxicam (MOBIC) 15 MG tablet TAKE 1 TABLET (15 MG  TOTAL) BY MOUTH AS NEEDED.   . metFORMIN (GLUCOPHAGE) 500 MG tablet TAKE 1 TABLET (500 MG TOTAL) BY MOUTH 2 (TWO) TIMES DAILY WITH A MEAL. (Patient taking differently: Take 500 mg by mouth daily with breakfast. )   . Milk Thistle 500 MG CAPS Take 500 mg by mouth 2 (two) times daily.    . Multiple Vitamin (MULTIVITAMIN) tablet Take 1 tablet by mouth daily.   Marland Kitchen nystatin (MYCOSTATIN/NYSTOP) powder Apply topically 3 (three) times daily as needed.   . nystatin cream (MYCOSTATIN) Apply 1 application topically 3 (three) times daily.   . pantoprazole (PROTONIX) 40 MG tablet Take 1 tablet (40  mg total) by mouth daily before breakfast.   . Probiotic Product (PRO-BIOTIC BLEND PO) Take 1 capsule by mouth daily.   Marland Kitchen SPIRIVA HANDIHALER 18 MCG inhalation capsule INHALE 1 CAPSULE VIA HANDIHALER ONCE DAILY AT THE SAME TIME EVERY DAY   . VENTOLIN HFA 108 (90 Base) MCG/ACT inhaler INHALE 2 PUFFS BY MOUTH EVERY 6 HOURS AS NEEDED FOR WHEEZING/SHORTNESS OF BREATH   . [DISCONTINUED] ipratropium-albuterol (DUONEB) 0.5-2.5 (3) MG/3ML SOLN Take 3 mLs by nebulization every 2 (two) hours as needed (shortness of breath). (Patient not taking: Reported on 08/27/2019)    No facility-administered encounter medications on file as of 08/27/2019.    Goals Addressed            This Visit's Progress   . PharmD - Medication Review       Current Barriers:  . Chronic Disease Management support, education, and care coordination needs related to HTN, COPD, and DMII  Pharmacist Clinical Goal(s):  Marland Kitchen Over the next 30 days, patient will work with CM Pharmacist to review medications and address needs related to medications that are identified.  Interventions: . Collaborated with PCP regarding potential for drug interaction between citalopram and esomeprazole o Provider agreed to switch of patient's PPI therapy to pantoprazole for decreased risk of interaction. o Esomeprazole prescription discontinued; switched to pantoprazole, new Rx sent to pharmacy on 2/11 . Call to patient and counsel on change of PPI therapy from esomeprazole to pantoprazole 40 mg once daily before breakfast o Patient verbalizes understanding. o Patient expresses concern about whether pantoprazole will offer similar level of acid control as esomeprazole.  - Counsel on shared mechanism of action between the two medications - Patient states that she will follow up with provider if symptoms uncontrolled by pantoprazole. . Patient updates that she is currently using albuterol nebulizer solution 2.5 mg /3 mL (0.083 %) - 1 vial via nebulizer every 6  hours a needed for wheezing as prescribed by Pulmonologist, rather than previous albuterol-ipratropium  nebulizer solution.  o Update medication list in chart accordingly.  Patient Self Care Activities:  . To call provider office for new concerns or questions . To check BP regularly and keep record  Please see past updates related to this goal by clicking on the "Past Updates" button in the selected goal         Plan  The care management team will reach out to the patient again over the next 30 days.   Harlow Asa, PharmD, [place] [employer] [phone]

## 2019-08-27 NOTE — Patient Instructions (Signed)
Thank you allowing the Chronic Care Management Team to be a part of your care! It was a pleasure speaking with you today!     CCM (Chronic Care Management) Team    Noreene Larsson RN, MSN, CCM Nurse Care Coordinator  [phone]   Harlow Asa PharmD  Clinical Pharmacist  [phone]   Eula Fried LCSW Clinical Social Worker [phone]  Visit Information  Goals Addressed            This Visit's Progress   . PharmD - Medication Review       Current Barriers:  . Chronic Disease Management support, education, and care coordination needs related to HTN, COPD, and DMII  Pharmacist Clinical Goal(s):  Marland Kitchen Over the next 30 days, patient will work with CM Pharmacist to review medications and address needs related to medications that are identified.  Interventions: . Collaborated with PCP regarding potential for drug interaction between citalopram and esomeprazole o Provider agreed to switch of patient's PPI therapy to pantoprazole for decreased risk of interaction. o Esomeprazole prescription discontinued; switched to pantoprazole, new Rx sent to pharmacy on 2/11 . Call to patient and counsel on change of PPI therapy from esomeprazole to pantoprazole 40 mg once daily before breakfast o Patient verbalizes understanding. o Patient expresses concern about whether pantoprazole will offer similar level of acid control as esomeprazole.  - Counsel on shared mechanism of action between the two medications - Patient states that she will follow up with provider if symptoms uncontrolled by pantoprazole. . Patient updates that she is currently using albuterol nebulizer solution 2.5 mg /3 mL (0.083 %) - 1 vial via nebulizer every 6 hours a needed for wheezing as prescribed by Pulmonologist, rather than previous albuterol-ipratropium  nebulizer solution.  o Update medication list in chart accordingly.  Patient Self Care Activities:  . To call provider office for new concerns or  questions . To check BP regularly and keep record  Please see past updates related to this goal by clicking on the "Past Updates" button in the selected goal         The patient verbalized understanding of instructions provided today and declined a print copy of patient instruction materials.   The care management team will reach out to the patient again over the next 30 days.   Harlow Asa, PharmD, [place] [employer] [phone]

## 2019-09-10 ENCOUNTER — Ambulatory Visit: Payer: Medicare Other | Attending: Family | Admitting: Family

## 2019-09-10 ENCOUNTER — Other Ambulatory Visit: Payer: Self-pay

## 2019-09-10 ENCOUNTER — Encounter (INDEPENDENT_AMBULATORY_CARE_PROVIDER_SITE_OTHER): Payer: Self-pay | Admitting: Family

## 2019-09-10 VITALS — BP 166/94 | HR 85 | Temp 97.6°F | Resp 18 | Ht 64.0 in | Wt 220.0 lb

## 2019-09-10 DIAGNOSIS — S0081XA Abrasion of other part of head, initial encounter: Secondary | ICD-10-CM

## 2019-09-10 DIAGNOSIS — W5503XA Scratched by cat, initial encounter: Secondary | ICD-10-CM | POA: Insufficient documentation

## 2019-09-10 DIAGNOSIS — S0501XA Injury of conjunctiva and corneal abrasion without foreign body, right eye, initial encounter: Secondary | ICD-10-CM | POA: Insufficient documentation

## 2019-09-10 MED ORDER — SULFAMETHOXAZOLE 800 MG-TRIMETHOPRIM 160 MG TABLET
1.00 | ORAL_TABLET | Freq: Two times a day (BID) | ORAL | 0 refills | Status: AC
Start: 2019-09-10 — End: 2019-09-17

## 2019-09-10 MED ORDER — POLYMYXIN B SULFATE 10,000 UNIT-TRIMETHOPRIM 1 MG/ML EYE DROPS
1.00 [drp] | Freq: Four times a day (QID) | OPHTHALMIC | 0 refills | Status: AC
Start: 2019-09-10 — End: 2019-09-17

## 2019-09-10 NOTE — Progress Notes (Signed)
[place]  [place] [address]  Phone: [phone]  Fax: [phone]    Encounter Date: 09/10/2019    Patient ID:  Kattaleya Alia  DJS:[id number]    DOB: 12-04-1960  Age: 59 y.o. female    Subjective:     Chief Complaint   Patient presents with   . Cat scratch     Pt presents today c/o cat scratch to right eye, cheek, and forehead. Scratch occurred yesterday.   Reports redness and soreness to right eye. Pt instilled artificial tears per recommendation of her sister, who is a Marine scientist.   Denies drainage from eye or other scratches.   Denies visual disturbances.   Denies fever.     The history is provided by the patient.     Current Outpatient Medications   Medication Sig   . aspirin 81 mg Oral Tablet, Chewable Take 81 mg by mouth Once a day   . baclofen (LIORESAL) 10 mg Oral Tablet Take 10 mg by mouth Three times a day   . gabapentin (NEURONTIN) 300 mg Oral Capsule Take 300 mg by mouth   . hydroCHLOROthiazide (HYDRODIURIL) 25 mg Oral Tablet Take 25 mg by mouth Once a day   . levothyroxine (SYNTHROID) 100 mcg Oral Tablet Take 100 mcg by mouth Every morning   . MetFORMIN (GLUCOPHAGE) 1,000 mg Oral Tablet Take 1,000 mg by mouth Twice daily with food   . pantoprazole (PROTONIX) 20 mg Oral Tablet, Delayed Release (E.C.) Take 20 mg by mouth Every morning before breakfast   . polymyxin B sulf-trimethoprim (POLYTRIM) 10,000 unit- 1 mg/mL Ophthalmic Drops Instill 1 Drop into right eye Every 6 hours for 7 days   . trimethoprim-sulfamethoxazole (BACTRIM DS) 160-800mg  per tablet Take 1 Tablet (160 mg total) by mouth Twice daily for 7 days     Allergies   Allergen Reactions   . Cleocin [Clindamycin] Hives/ Urticaria   . Nyquil [Doxylamin-Pse-Dm-Acetaminophen] Hives/ Urticaria     Past Medical History:   Diagnosis Date   . Asthma    . Diabetes (CMS [place])    . Hypertension    . Hypothyroid          Past Surgical History:   Procedure Laterality Date   . HX TUBAL LIGATION     . LEG  SURGERY           Family Medical History:     None            Social History     Tobacco Use   . Smoking status: Former Research scientist (life sciences)   . Smokeless tobacco: Never Used   Substance Use Topics   . Alcohol use: Not on file   . Drug use: Not on file       Review of Systems   Eyes: Positive for pain and redness.        Scratch to right eye   Skin: Positive for wound.   All other systems reviewed and are negative.    Objective:   Vitals: BP (!) 166/94   Pulse 85   Temp 36.4 C (97.6 F)   Resp 18   Ht 1.626 m (5\' 4" )   Wt 99.8 kg (220 lb)   SpO2 96%   BMI 37.76 kg/m         Physical Exam  Vitals reviewed.   Constitutional:       Appearance: Normal appearance.   HENT:      Head: Normocephalic.  Eyes:      Comments: Lateral right conjunctiva erythematous with small central corneal abrasion. No discharge noted.  No fluorescein lamp available in clinic, therefore, unable to fully evaluate extent of abrasion.   Cardiovascular:      Rate and Rhythm: Normal rate and regular rhythm.      Heart sounds: Normal heart sounds.   Pulmonary:      Effort: Pulmonary effort is normal.      Breath sounds: Normal breath sounds.   Musculoskeletal:      Cervical back: Neck supple.   Lymphadenopathy:      Cervical: No cervical adenopathy.   Skin:     Comments: Pinpoint-sized scabbed lesion on right cheek. Minimal surrounding erythema. Similar lesion on superior right side of forehead, just within hairline. No drainage.   Neurological:      General: No focal deficit present.      Mental Status: She is alert and oriented to person, place, and time.   Psychiatric:         Mood and Affect: Mood normal.         Behavior: Behavior normal.         Assessment & Plan:     ENCOUNTER DIAGNOSES     ICD-10-CM   1. Corneal abrasion, right  S05.01XA   2. Cat scratch of face  S00.81XA    W55.03XA       Orders Placed This Encounter   . trimethoprim-sulfamethoxazole (BACTRIM DS) 160-800mg  per tablet   . polymyxin B sulf-trimethoprim (POLYTRIM) 10,000 unit- 1  mg/mL Ophthalmic Drops     Patient Instructions   Per UpToDate, treating with polytrim drops and bactrim today. Discussed erythromycin ointment, but pt requested drops.   Advised pt to f/u with ophthalmologist ASAP for full evaluation of abrasion to determine wound's extent and any necessary further treatment. Pt agreed.  Apply abx ointment to skin lesions TID. Keep areas clean and dry.   Return to clinic if needed.    Marjie Skiff, NP

## 2019-09-10 NOTE — Patient Instructions (Signed)
Per UpToDate, treating with polytrim drops and bactrim today. Discussed erythromycin ointment, but pt requested drops.   Advised pt to f/u with ophthalmologist ASAP for full evaluation of abrasion to determine wound's extent and any necessary further treatment. Pt agreed.  Apply abx ointment to skin lesions TID. Keep areas clean and dry.   Return to clinic if needed.

## 2019-09-17 ENCOUNTER — Telehealth: Payer: Medicaid Other

## 2019-09-19 ENCOUNTER — Other Ambulatory Visit: Payer: Self-pay | Admitting: Family Medicine

## 2019-09-19 ENCOUNTER — Ambulatory Visit (INDEPENDENT_AMBULATORY_CARE_PROVIDER_SITE_OTHER): Payer: Medicare Other | Admitting: Pharmacist

## 2019-09-19 DIAGNOSIS — I10 Essential (primary) hypertension: Secondary | ICD-10-CM | POA: Diagnosis not present

## 2019-09-19 DIAGNOSIS — K219 Gastro-esophageal reflux disease without esophagitis: Secondary | ICD-10-CM

## 2019-09-19 DIAGNOSIS — L219 Seborrheic dermatitis, unspecified: Secondary | ICD-10-CM

## 2019-09-19 MED ORDER — KETOCONAZOLE 2 % EX SHAM: 1.0000 "application " | MEDICATED_SHAMPOO | CUTANEOUS | 1 refills | Status: AC

## 2019-09-19 NOTE — Patient Instructions (Signed)
Thank you allowing the Chronic Care Management Team to be a part of your care! It was a pleasure speaking with you today!     CCM (Chronic Care Management) Team    Noreene Larsson RN, MSN, CCM Nurse Care Coordinator  [phone]   Harlow Asa PharmD  Clinical Pharmacist  [phone]   Eula Fried LCSW Clinical Social Worker [phone]  Visit Information  Goals Addressed            This Visit's Progress   . PharmD - Medication Review       Current Barriers:  . Chronic Disease Management support, education, and care coordination needs related to HTN, COPD, and DMII  Pharmacist Clinical Goal(s):  Marland Kitchen Over the next 30 days, patient will work with CM Pharmacist to review medications and address needs related to medications that are identified.  Interventions: . Return call to Ms. Sherlene Shams for f/u regarding PPI therapy o Patient reports pantoprazole 40 mg daily not controlling her acid symptoms, reports takes pantoprazole before breakfast and acid symptoms return by the afternoon.  o Note PPI therapy switched from esomeprazole to pantoprazole due to potential drug interaction between citalopram and esomeprazole o Will collaborate with PCP regarding patient's PPI therapy . Follow up with patient regarding BP control and monitoring o Reports taking HCTZ 25 mg once daily  o Reports BP this morning: 132/76 o Reports that she has significant swelling in her legs, particularly right leg. - Advise patient to contact office to let provider know . Patient requests refill of ketoconazole shampoo prescription . Counsel patient on importance of following up with provider office for medical questions/concerns . Will collaborate with PCP  Patient Self Care Activities:  . To call provider office for new concerns or questions . To check BP regularly and keep record  Please see past updates related to this goal by clicking on the "Past Updates" button in the selected goal          Patient verbalizes understanding of instructions provided today.   The care management team will reach out to the patient again over the next 30 days.   Harlow Asa, PharmD, [place] [employer] [phone]

## 2019-09-19 NOTE — Chronic Care Management (AMB) (Signed)
Chronic Care Management   Follow Up Note   09/19/2019 Name: Neve Branscomb MRN: [id number] DOB: 1960-08-06  Referred by: [name], DO Reason for referral : Chronic Care Management (Patient Phone Call)   Mykelti Goldenstein is a 59 y.o. year old female who is a primary care patient of [name], DO. The CCM team was consulted for assistance with chronic disease management and care coordination needs.  Ms. Delong has a past medical history including but not limited to hypertension, COPD, type 2 diabetes mellitus, chronic pain, OSA, morbid obesity and GERD.  Receive a voicemail from Ms. Sherlene Shams requesting a call back regarding her acid reflux medication.  I reached out to [name] by phone today.   Review of patient status, including review of consultants reports, relevant laboratory and other test results, and collaboration with appropriate care team members and the patient's provider was performed as part of comprehensive patient evaluation and provision of chronic care management services.      Outpatient Encounter Medications as of 09/19/2019  Medication Sig Note  . hydrochlorothiazide (HYDRODIURIL) 25 MG tablet Take 1 tablet (25 mg total) by mouth daily.   Marland Kitchen albuterol (PROVENTIL) (2.5 MG/3ML) 0.083% nebulizer solution Take 2.5 mg by nebulization every 6 (six) hours as needed for wheezing or shortness of breath.   Marland Kitchen amitriptyline (ELAVIL) 25 MG tablet Take 0.5 tablets (12.5 mg total) by mouth at bedtime. 08/22/2019: Taking 1/4 - 1/2 at bedtime as needed  . aspirin EC 81 MG tablet Take 81 mg by mouth at bedtime.   . Aspirin-Salicylamide-Caffeine (BC HEADACHE PO) Take 2 packets by mouth daily as needed (pain).   . baclofen (LIORESAL) 10 MG tablet TAKE 1 TABLET IN MORNING, 1 TABLET IN AFTERNOON, AND 2 TABLETS IN EVENING   . Black Pepper-Turmeric (TURMERIC COMPLEX/BLACK PEPPER) 3-500 MG CAPS Take by mouth.   . Blood Glucose  Monitoring Suppl (ACCU-CHEK GUIDE ME) w/Device KIT 1 kit by Does not apply route daily. Check blood sugar up to x 2 daily as instructed   . budesonide-formoterol (SYMBICORT) 160-4.5 MCG/ACT inhaler TAKE 2 PUFFS BY MOUTH TWICE A DAY   . Calcium Carbonate-Vit D-Min (CALCIUM 600+D PLUS MINERALS PO) Take 1 tablet by mouth daily.    . chlorhexidine (PERIDEX) 0.12 % solution 5 mLs by Mouth Rinse route daily as needed (mouth sores).    . citalopram (CELEXA) 40 MG tablet TAKE 1 TABLET BY MOUTH EVERY DAY   . Cranberry 450 MG TABS Take by mouth.   . ferrous sulfate 325 (65 FE) MG tablet Take 325 mg by mouth daily.    . fluoruracil (CARAC) 0.5 % cream Apply 0.5 mg topically daily as needed (skin spots).    . furosemide (LASIX) 20 MG tablet Take 1 tablet (20 mg total) by mouth daily as needed. Alternate taking 1, 20 mg Lasix with 2, 20 mg tabs, every other day   . gabapentin (NEURONTIN) 400 MG capsule TAKE 1 CAPSULE (400 MG TOTAL) BY MOUTH 3 (THREE) TIMES DAILY.   Marland Kitchen glucose blood test strip Check blood sugar up to 2 x daily as instructed   . ketoconazole (NIZORAL) 2 % shampoo Apply 1 application topically 3 (three) times a week.    . Lancets (ACCU-CHEK SOFT TOUCH) lancets Check blood sugar up to 2 x daily as instructed   . levothyroxine (SYNTHROID) 175 MCG tablet TAKE 1 TABLET (175 MCG TOTAL) BY MOUTH DAILY BEFORE BREAKFAST.   . Magnesium 250 MG TABS Take 1  tablet by mouth daily.   . meloxicam (MOBIC) 15 MG tablet TAKE 1 TABLET (15 MG TOTAL) BY MOUTH AS NEEDED.   . metFORMIN (GLUCOPHAGE) 500 MG tablet TAKE 1 TABLET (500 MG TOTAL) BY MOUTH 2 (TWO) TIMES DAILY WITH A MEAL. (Patient taking differently: Take 500 mg by mouth daily with breakfast. )   . Milk Thistle 500 MG CAPS Take 500 mg by mouth 2 (two) times daily.    . Multiple Vitamin (MULTIVITAMIN) tablet Take 1 tablet by mouth daily.   Marland Kitchen nystatin (MYCOSTATIN/NYSTOP) powder Apply topically 3 (three) times daily as needed.   . nystatin cream (MYCOSTATIN)  Apply 1 application topically 3 (three) times daily.   . pantoprazole (PROTONIX) 40 MG tablet Take 1 tablet (40 mg total) by mouth daily before breakfast.   . Probiotic Product (PRO-BIOTIC BLEND PO) Take 1 capsule by mouth daily.   Marland Kitchen SPIRIVA HANDIHALER 18 MCG inhalation capsule INHALE 1 CAPSULE VIA HANDIHALER ONCE DAILY AT THE SAME TIME EVERY DAY   . VENTOLIN HFA 108 (90 Base) MCG/ACT inhaler INHALE 2 PUFFS BY MOUTH EVERY 6 HOURS AS NEEDED FOR WHEEZING/SHORTNESS OF BREATH    No facility-administered encounter medications on file as of 09/19/2019.    Goals Addressed            This Visit's Progress   . PharmD - Medication Review       Current Barriers:  . Chronic Disease Management support, education, and care coordination needs related to HTN, COPD, and DMII  Pharmacist Clinical Goal(s):  Marland Kitchen Over the next 30 days, patient will work with CM Pharmacist to review medications and address needs related to medications that are identified.  Interventions: . Return call to Ms. Sherlene Shams for f/u regarding PPI therapy o Patient reports pantoprazole 40 mg daily not controlling her acid symptoms, reports takes pantoprazole before breakfast and acid symptoms return by the afternoon.  o Note PPI therapy switched from esomeprazole to pantoprazole due to potential drug interaction between citalopram and esomeprazole o Will collaborate with PCP regarding patient's PPI therapy . Follow up with patient regarding BP control and monitoring o Reports taking HCTZ 25 mg once daily  o Reports BP this morning: 132/76 o Reports that she has significant swelling in her legs, particularly right leg. - Advise patient to contact office to let provider know . Patient requests refill of ketoconazole shampoo prescription . Counsel patient on importance of following up with provider office for medical questions/concerns . Will collaborate with PCP  Patient Self Care Activities:  . To call provider office for new  concerns or questions . To check BP regularly and keep record  Please see past updates related to this goal by clicking on the "Past Updates" button in the selected goal         Plan  The care management team will reach out to the patient again over the next 30 days.   Harlow Asa, PharmD, [place] [employer] [phone]

## 2019-09-24 ENCOUNTER — Telehealth: Payer: Medicaid Other

## 2019-10-17 ENCOUNTER — Telehealth: Payer: Medicaid Other

## 2019-10-29 ENCOUNTER — Ambulatory Visit: Payer: Medicare Other | Admitting: Pharmacist

## 2019-10-29 NOTE — Chronic Care Management (AMB) (Signed)
Chronic Care Management   Follow Up Note   10/29/2019 Name: Erin Potter MRN: [id number] DOB: 08/27/1960  Referred by: [name], DO Reason for referral : Chronic Care Management (Patient Phone Call)   Erin Potter is a 59 y.o. year old female who is a primary care patient of [name], DO. The CCM team was consulted for assistance with chronic disease management and care coordination needs. Ms. Dossett has a past medical history including but not limited to hypertension, COPD, type 2 diabetes mellitus, chronic pain, OSA, morbid obesity and GERD.   I reached out to [name] by phone today.   Review of patient status, including review of consultants reports, relevant laboratory and other test results, and collaboration with appropriate care team members and the patient's provider was performed as part of comprehensive patient evaluation and provision of chronic care management services.     Outpatient Encounter Medications as of 10/29/2019  Medication Sig Note  . albuterol (PROVENTIL) (2.5 MG/3ML) 0.083% nebulizer solution Take 2.5 mg by nebulization every 6 (six) hours as needed for wheezing or shortness of breath.   Marland Kitchen amitriptyline (ELAVIL) 25 MG tablet Take 0.5 tablets (12.5 mg total) by mouth at bedtime. 08/22/2019: Taking 1/4 - 1/2 at bedtime as needed  . aspirin EC 81 MG tablet Take 81 mg by mouth at bedtime.   . Aspirin-Salicylamide-Caffeine (BC HEADACHE PO) Take 2 packets by mouth daily as needed (pain).   . baclofen (LIORESAL) 10 MG tablet TAKE 1 TABLET IN MORNING, 1 TABLET IN AFTERNOON, AND 2 TABLETS IN EVENING   . Black Pepper-Turmeric (TURMERIC COMPLEX/BLACK PEPPER) 3-500 MG CAPS Take by mouth.   . Blood Glucose Monitoring Suppl (ACCU-CHEK GUIDE ME) w/Device KIT 1 kit by Does not apply route daily. Check blood sugar up to x 2 daily as instructed   . budesonide-formoterol (SYMBICORT) 160-4.5 MCG/ACT inhaler TAKE 2  PUFFS BY MOUTH TWICE A DAY   . Calcium Carbonate-Vit D-Min (CALCIUM 600+D PLUS MINERALS PO) Take 1 tablet by mouth daily.    . chlorhexidine (PERIDEX) 0.12 % solution 5 mLs by Mouth Rinse route daily as needed (mouth sores).    . citalopram (CELEXA) 40 MG tablet TAKE 1 TABLET BY MOUTH EVERY DAY   . Cranberry 450 MG TABS Take by mouth.   . ferrous sulfate 325 (65 FE) MG tablet Take 325 mg by mouth daily.    . fluoruracil (CARAC) 0.5 % cream Apply 0.5 mg topically daily as needed (skin spots).    . furosemide (LASIX) 20 MG tablet Take 1 tablet (20 mg total) by mouth daily as needed. Alternate taking 1, 20 mg Lasix with 2, 20 mg tabs, every other day   . gabapentin (NEURONTIN) 400 MG capsule TAKE 1 CAPSULE (400 MG TOTAL) BY MOUTH 3 (THREE) TIMES DAILY.   Marland Kitchen glucose blood test strip Check blood sugar up to 2 x daily as instructed   . hydrochlorothiazide (HYDRODIURIL) 25 MG tablet Take 1 tablet (25 mg total) by mouth daily.   Marland Kitchen ketoconazole (NIZORAL) 2 % shampoo Apply 1 application topically 3 (three) times a week.   . Lancets (ACCU-CHEK SOFT TOUCH) lancets Check blood sugar up to 2 x daily as instructed   . levothyroxine (SYNTHROID) 175 MCG tablet TAKE 1 TABLET (175 MCG TOTAL) BY MOUTH DAILY BEFORE BREAKFAST.   . Magnesium 250 MG TABS Take 1 tablet by mouth daily.   . meloxicam (MOBIC) 15 MG tablet TAKE 1 TABLET (15 MG TOTAL)  BY MOUTH AS NEEDED.   . metFORMIN (GLUCOPHAGE) 500 MG tablet TAKE 1 TABLET (500 MG TOTAL) BY MOUTH 2 (TWO) TIMES DAILY WITH A MEAL. (Patient taking differently: Take 500 mg by mouth daily with breakfast. )   . Milk Thistle 500 MG CAPS Take 500 mg by mouth 2 (two) times daily.    . Multiple Vitamin (MULTIVITAMIN) tablet Take 1 tablet by mouth daily.   Marland Kitchen nystatin (MYCOSTATIN/NYSTOP) powder Apply topically 3 (three) times daily as needed.   . nystatin cream (MYCOSTATIN) Apply 1 application topically 3 (three) times daily.   . pantoprazole (PROTONIX) 40 MG tablet Take 1 tablet (40  mg total) by mouth daily before breakfast.   . Probiotic Product (PRO-BIOTIC BLEND PO) Take 1 capsule by mouth daily.   Marland Kitchen SPIRIVA HANDIHALER 18 MCG inhalation capsule INHALE 1 CAPSULE VIA HANDIHALER ONCE DAILY AT THE SAME TIME EVERY DAY   . VENTOLIN HFA 108 (90 Base) MCG/ACT inhaler INHALE 2 PUFFS BY MOUTH EVERY 6 HOURS AS NEEDED FOR WHEEZING/SHORTNESS OF BREATH    No facility-administered encounter medications on file as of 10/29/2019.    Goals Addressed            This Visit's Progress   . PharmD - Medication Review       Current Barriers:  . Chronic Disease Management support, education, and care coordination needs related to HTN, COPD, and DMII  Pharmacist Clinical Goal(s):  Marland Kitchen Over the next 30 days, patient will work with CM Pharmacist to review medications and address needs related to medications that are identified.  Interventions: . Previously collaborated with PCP and advised patient to schedule follow up visit with provider to address GERD and leg swelling concerns o From review of chart, note PCP visit not scheduled . Today patient reports she feels like she needs to be seen by Cardiologist and Pulmonologist. Reports having been more short of breath recently o Encourage patient in her plan to follow up with these providers . Again encourage patient to schedule follow up visit with PCP regarding GERD control and any other medical questions/concerns . Patient denies medication questions/concerns at this time  Patient Self Care Activities:  . To call provider office for new concerns or questions . To check BP regularly and keep record  Please see past updates related to this goal by clicking on the "Past Updates" button in the selected goal         Plan  1) Patient denies further pharmacy needs at this time. 2) The patient has been provided with contact information for CM Pharmacist and has been advised to call with any medication related questions or concerns.    Harlow Asa, PharmD, [facility] [employer] [phone]

## 2019-11-14 ENCOUNTER — Telehealth: Payer: Self-pay | Admitting: Family Medicine

## 2019-11-14 NOTE — Telephone Encounter (Signed)
Fax today from [facility] [address] [place] [address], Ph [phone], Fax [phone], [facility] Pickup - [email or web address]  Completed DM Shoe form. Dated last visit in person DM Foot exam 05/14/19, within 6 months. Will fax record and form today 11/14/19  Nobie Putnam, [facility] 11/14/2019, 3:29 PM

## 2019-11-19 ENCOUNTER — Ambulatory Visit: Payer: Self-pay | Admitting: *Deleted

## 2019-11-19 NOTE — Telephone Encounter (Signed)
Patient called for virtual appointment for the high blood pressure and headaches she has been having for "awhile". She is not interested in speaking with NT at this time. I did review s/sx related to high b/p and that if she experienced she should seek emergency treatment immediately. Takes Hydrodiuril 25 mg tabs daily as directed she stated. Appointment tomorrow 11/20/19 @10 :40a.

## 2019-11-20 ENCOUNTER — Other Ambulatory Visit: Payer: Self-pay

## 2019-11-20 ENCOUNTER — Encounter: Payer: Self-pay | Admitting: Family Medicine

## 2019-11-20 ENCOUNTER — Ambulatory Visit (INDEPENDENT_AMBULATORY_CARE_PROVIDER_SITE_OTHER): Payer: Medicare Other | Admitting: Family Medicine

## 2019-11-20 ENCOUNTER — Telehealth: Payer: Self-pay

## 2019-11-20 DIAGNOSIS — E1165 Type 2 diabetes mellitus with hyperglycemia: Secondary | ICD-10-CM

## 2019-11-20 DIAGNOSIS — F411 Generalized anxiety disorder: Secondary | ICD-10-CM

## 2019-11-20 DIAGNOSIS — IMO0002 Reserved for concepts with insufficient information to code with codable children: Secondary | ICD-10-CM

## 2019-11-20 DIAGNOSIS — F331 Major depressive disorder, recurrent, moderate: Secondary | ICD-10-CM

## 2019-11-20 DIAGNOSIS — E1140 Type 2 diabetes mellitus with diabetic neuropathy, unspecified: Secondary | ICD-10-CM

## 2019-11-20 DIAGNOSIS — I10 Essential (primary) hypertension: Secondary | ICD-10-CM

## 2019-11-20 DIAGNOSIS — K121 Other forms of stomatitis: Secondary | ICD-10-CM

## 2019-11-20 MED ORDER — LOSARTAN POTASSIUM 50 MG PO TABS: 50.0000 mg | ORAL_TABLET | Freq: Every day | ORAL | 0 refills | Status: DC

## 2019-11-20 MED ORDER — CHLORHEXIDINE GLUCONATE 0.12 % MT SOLN: 5.0000 mL | Freq: Every day | OROMUCOSAL | 0 refills | Status: AC | PRN

## 2019-11-20 MED ORDER — SERTRALINE HCL 50 MG PO TABS: 50.0000 mg | ORAL_TABLET | Freq: Every day | ORAL | 0 refills | Status: DC

## 2019-11-20 NOTE — Assessment & Plan Note (Signed)
Recently worsening mood with emotional lability Chronic multifactorial component, GAD assoc - Failed: Paxil, Effexor (intolerance), Celexa in effective. She declines to take Wellbutrin husband had issue on it with behavior change she is scared to take it. - No prior Psych / counseling, declined  Plan: 1. Discussion on complexity of mental health med management - Agree to switch med again today, different SSRI - from Celexa 40mg  taper down instructions over 4 weeks given, then start Sertraline 50mg  daily new rx sent.  Future, may benefit from therapy / psych

## 2019-11-20 NOTE — Telephone Encounter (Signed)
Questions answered. She had concerns on HCTZ vs Lasix. I answered her questions on fluid pills. She will keep Lasix as an as needed for fluid / swelling. And stop HCTZ.  She had question on tapering Celexa, she requested a 2 week taper instead of 4 week taper. I agreed that is reasonable. She will do that then switch to Sertraline.  Nobie Putnam, [facility] 11/20/2019, 4:54 PM

## 2019-11-20 NOTE — Assessment & Plan Note (Signed)
See A&P major depression

## 2019-11-20 NOTE — Telephone Encounter (Signed)
Copied from [place] [id number]. Topic: General - Inquiry >> Nov 20, 2019 12:29 PM Alease Frame wrote: Reason for CRM: Pt returning call from office wanting to know if she should cut medication in half. Please advise

## 2019-11-20 NOTE — Patient Instructions (Addendum)
Thank you for coming to the office today.  Medication Taper Off Instructions Current med - Celexa 40mg   Week 1: Alternate every OTHER day - Celexa 40mg  (whole tab) and then next day 20mg  (HALF tab) Week 2-3: Celexa 20 mg daily (HALF tablet) Week 4: Celexa 20mg  every OTHER day (HALF tablet) - alternating day is NO medicine (skip dose) STOP completely  Optional weeks 5-6: you may extend the week 4 plan for up to 3 weeks if need. Alternating half pill one day and no pill the next. May space out to every 3 days if need.  After finished  - then start new medication Sertraline (Zoloft brand) 50mg  daily every day.  DUE for FASTING BLOOD WORK (no food or drink after midnight before the lab appointment, only water or coffee without cream/sugar on the morning of)  SCHEDULE "Lab Only" visit in the morning at the clinic for lab draw in 4 MONTHS   - Make sure Lab Only appointment is at about 1 week before your next appointment, so that results will be available  For Lab Results, once available within 2-3 days of blood draw, you can can log in to MyChart online to view your results and a brief explanation. Also, we can discuss results at next follow-up visit.    Please schedule a Follow-up Appointment to: Return in about 4 months (around 03/22/2020) for 4 month for Yearly Medicare Check up (and labs).  If you have any other questions or concerns, please feel free to call the office or send a message through [facility]. You may also schedule an earlier appointment if necessary.  Additionally, you may be receiving a survey about your experience at our office within a few days to 1 week by e-mail or mail. We value your feedback.  Nobie Putnam, DO [place]

## 2019-11-20 NOTE — Assessment & Plan Note (Signed)
Uncontrolled BP, elevated lately, ineffective HCTZ - Home BP readings variable  No known complications    Plan:  1. New start ARB Losartan 50mg  daily, she was on ACEi Lisinopril previously and did fine, did not have problem with HyperK or CKD. Will defer lab check since [place] will be getting labs including chemistry when she returns in approx 03/2020 2. Encourage improved lifestyle - low sodium diet, regular exercise 3. Continue monitor BP outside office, bring readings to next visit, if persistently >140/90 or new symptoms notify office sooner  Advised may warrant consultation with her Cardiologist on BP if uncontrolled. May consider secondary causes of HTN as well, possible OSA or other hormonal etiology.

## 2019-11-20 NOTE — Telephone Encounter (Signed)
Yes. She may cut the new medication in half if she prefers to start - the Sertraline 50mg , if she wants to start it at 25mg  when she is ready to take it that is fine. She should follow the taper instructions to reduce Celexa first however for 4 weeks before starting new one.  Nobie Putnam, [facility] 11/20/2019, 4:29 PM

## 2019-11-20 NOTE — Progress Notes (Signed)
Virtual Visit via Telephone The purpose of this virtual visit is to provide medical care while limiting exposure to the novel coronavirus (COVID19) for both patient and office staff.  Consent was obtained for phone visit:  Yes.   Answered questions that patient had about telehealth interaction:  Yes.   I discussed the limitations, risks, security and privacy concerns of performing an evaluation and management service by telephone. I also discussed with the patient that there may be a patient responsible charge related to this service. The patient expressed understanding and agreed to proceed.  Patient Location: Home Provider Location: [facility])  ---------------------------------------------------------------------- Chief Complaint  Patient presents with  . Hypertension    191/101,196/102,178/105---177/92, blurred vision, HA onset couple of month    S: Reviewed CMA documentation. I have called patient and gathered additional HPI as follows:  CHRONIC HTN: Last visit 07/2019. She was started on HCTZ '25mg'$  daily, seemed ineffective. She is still taking it but has had higher BP readings up to 180-190 / 100s. Admits headaches worse and blurred vision at times due to high BP. Current Meds - Furosemide '20mg'$  PRN, HCTZ '25mg'$  daily Previously quit smoking 6 years ago BP improved. - Previously on Lisinopril, HCTZ, Amlodipine - had some issues with BP med in past Has Cardiologist has not been recently. Denies CP, dyspnea, HA, edema, dizziness / lightheadedness  Med Rec She wants to confirm Metformin once daily not BID Nystatin large bottle powder on list 180g Nystatin cream PRN if need  Major Depression, moderate recurrent / GAD Anxiety Chronic problem. Having worse crying spells anxiety emotional lability Has life stressor sister is sick she is staying with her in [place] currently for about 1 month now, she does go back and forth. Previously on Paxil with good  results then it became ineffective. Was on Effexor 2018 ineffective. Has been on Celexa now for 3+ years did well but now less effective. Ready to switch meds - Taking Amitriptyline 12.'5mg'$  nightly PRN. Rarely takes.   Denies any known or suspected exposure to person with or possibly with COVID19.  Denies any fevers, chills, sweats, body ache, cough, shortness of breath, sinus pain or pressure, headache, abdominal pain, diarrhea  Past Medical History:  Diagnosis Date  . Anxiety   . Asthma   . Diabetes mellitus, type 2 ([place])   . GERD (gastroesophageal reflux disease)   . Hypertension   . Hypothyroidism   . Neuromuscular disorder ([place])    LEFT ARM WEAKNESS-ULNAR NERVE ISSUE  . Shortness of breath dyspnea   . Sleep apnea    NO CPAP-ONLY FINISHED HALF TEST  . Wears dentures    full upper, partial lower   Social History   Tobacco Use  . Smoking status: Former Smoker    Packs/day: 1.50    Years: 35.00    Pack years: 52.50    Types: Cigarettes    Quit date: 02/24/2014    Years since quitting: 5.7  . Smokeless tobacco: Former Network engineer Use Topics  . Alcohol use: No    Alcohol/week: 0.0 standard drinks  . Drug use: No    Current Outpatient Medications:  .  albuterol (PROVENTIL) (2.5 MG/3ML) 0.083% nebulizer solution, Take 2.5 mg by nebulization every 6 (six) hours as needed for wheezing or shortness of breath., Disp: , Rfl:  .  amitriptyline (ELAVIL) 25 MG tablet, Take 0.5 tablets (12.5 mg total) by mouth at bedtime. (Patient taking differently: Take 12.5 mg by mouth at bedtime as  needed. ), Disp: 45 tablet, Rfl: 1 .  aspirin EC 81 MG tablet, Take 81 mg by mouth at bedtime., Disp: , Rfl:  .  Aspirin-Salicylamide-Caffeine (BC HEADACHE PO), Take 2 packets by mouth daily as needed (pain)., Disp: , Rfl:  .  baclofen (LIORESAL) 10 MG tablet, TAKE 1 TABLET IN MORNING, 1 TABLET IN AFTERNOON, AND 2 TABLETS IN EVENING, Disp: 360 tablet, Rfl: 1 .  Black Pepper-Turmeric (TURMERIC  COMPLEX/BLACK PEPPER) 3-500 MG CAPS, Take by mouth., Disp: , Rfl:  .  Blood Glucose Monitoring Suppl (ACCU-CHEK GUIDE ME) w/Device KIT, 1 kit by Does not apply route daily. Check blood sugar up to x 2 daily as instructed, Disp: 1 kit, Rfl: 0 .  Calcium Carbonate-Vit D-Min (CALCIUM 600+D PLUS MINERALS PO), Take 1 tablet by mouth daily. , Disp: , Rfl:  .  chlorhexidine (PERIDEX) 0.12 % solution, 5 mLs by Mouth Rinse route daily as needed (mouth sores)., Disp: 120 mL, Rfl: 0 .  Cranberry 450 MG TABS, Take by mouth., Disp: , Rfl:  .  fluoruracil (CARAC) 0.5 % cream, Apply 0.5 mg topically daily as needed (skin spots). , Disp: , Rfl: 0 .  furosemide (LASIX) 20 MG tablet, Take 1 tablet (20 mg total) by mouth daily as needed. Alternate taking 1, 20 mg Lasix with 2, 20 mg tabs, every other day, Disp: 30 tablet, Rfl: 2 .  gabapentin (NEURONTIN) 400 MG capsule, TAKE 1 CAPSULE (400 MG TOTAL) BY MOUTH 3 (THREE) TIMES DAILY., Disp: 270 capsule, Rfl: 1 .  glucose blood test strip, Check blood sugar up to 2 x daily as instructed, Disp: 100 each, Rfl: 3 .  ketoconazole (NIZORAL) 2 % shampoo, Apply 1 application topically 3 (three) times a week., Disp: 120 mL, Rfl: 1 .  Lancets (ACCU-CHEK SOFT TOUCH) lancets, Check blood sugar up to 2 x daily as instructed, Disp: 100 each, Rfl: 3 .  levothyroxine (SYNTHROID) 175 MCG tablet, TAKE 1 TABLET (175 MCG TOTAL) BY MOUTH DAILY BEFORE BREAKFAST., Disp: 90 tablet, Rfl: 1 .  Magnesium 250 MG TABS, Take 1 tablet by mouth daily., Disp: , Rfl:  .  meloxicam (MOBIC) 15 MG tablet, TAKE 1 TABLET (15 MG TOTAL) BY MOUTH AS NEEDED., Disp: 90 tablet, Rfl: 1 .  metFORMIN (GLUCOPHAGE) 500 MG tablet, Take 1 tablet (500 mg total) by mouth daily with breakfast., Disp: 90 tablet, Rfl: 1 .  Milk Thistle 500 MG CAPS, Take 500 mg by mouth 2 (two) times daily. , Disp: , Rfl:  .  Multiple Vitamin (MULTIVITAMIN) tablet, Take 1 tablet by mouth daily., Disp: , Rfl:  .  nystatin (MYCOSTATIN/NYSTOP)  powder, Apply topically 3 (three) times daily as needed., Disp: 180 g, Rfl: 2 .  nystatin cream (MYCOSTATIN), Apply 1 application topically 3 (three) times daily., Disp: 90 g, Rfl: 2 .  pantoprazole (PROTONIX) 40 MG tablet, Take 1 tablet (40 mg total) by mouth daily before breakfast., Disp: 90 tablet, Rfl: 1 .  pravastatin (PRAVACHOL) 10 MG tablet, Take 10 mg by mouth daily., Disp: , Rfl:  .  Probiotic Product (PRO-BIOTIC BLEND PO), Take 1 capsule by mouth daily., Disp: , Rfl:  .  SPIRIVA HANDIHALER 18 MCG inhalation capsule, INHALE 1 CAPSULE VIA HANDIHALER ONCE DAILY AT THE SAME TIME EVERY DAY, Disp: 30 capsule, Rfl: 1 .  trimethoprim-polymyxin b (POLYTRIM) ophthalmic solution, , Disp: , Rfl:  .  VENTOLIN HFA 108 (90 Base) MCG/ACT inhaler, INHALE 2 PUFFS BY MOUTH EVERY 6 HOURS AS NEEDED FOR WHEEZING/SHORTNESS OF  BREATH, Disp: 18 g, Rfl: 1 .  budesonide-formoterol (SYMBICORT) 160-4.5 MCG/ACT inhaler, TAKE 2 PUFFS BY MOUTH TWICE A DAY (Patient not taking: Reported on 11/20/2019), Disp: 30.6 Inhaler, Rfl: 1 .  ferrous sulfate 325 (65 FE) MG tablet, Take 325 mg by mouth daily. , Disp: , Rfl:  .  losartan (COZAAR) 50 MG tablet, Take 1 tablet (50 mg total) by mouth daily., Disp: 90 tablet, Rfl: 0 .  sertraline (ZOLOFT) 50 MG tablet, Take 1 tablet (50 mg total) by mouth daily., Disp: 90 tablet, Rfl: 0  Depression screen [facility] 2/9 11/20/2019 05/14/2019 12/06/2018  Decreased Interest 1 0 1  Down, Depressed, Hopeless 2 1 0  PHQ - 2 Score _0 Altered sleeping 3 3 0  Tired, decreased energy 1 3 0  Change in appetite 0 0 0  Feeling bad or failure about yourself  0 2 0  Trouble concentrating 0 0 0  Moving slowly or fidgety/restless 0 0 0  Suicidal thoughts 0 0 0  PHQ-9 Score _1 Difficult doing work/chores Somewhat difficult Somewhat difficult Not difficult at all    GAD 7 : Generalized Anxiety Score 12/06/2018 10/01/2016  Nervous, Anxious, on Edge 0 3  Control/stop worrying 0 3  Worry too much -  different things 1 2  Trouble relaxing 0 0  Restless 0 0  Easily annoyed or irritable 0 3  Afraid - awful might happen 0 2  Total GAD 7 Score 1 13  Anxiety Difficulty Not difficult at all Not difficult at all    -------------------------------------------------------------------------- O: No physical exam performed due to remote telephone encounter.  Lab results reviewed.  No results found for this or any previous visit (from the past 2160 hour(s)).  -------------------------------------------------------------------------- A&P:  Problem List Items Addressed This Visit    Recurrent major depressive episodes, moderate ([place])    Recently worsening mood with emotional lability Chronic multifactorial component, GAD assoc - Failed: Paxil, Effexor (intolerance), Celexa in effective. She declines to take Wellbutrin husband had issue on it with behavior change she is scared to take it. - No prior Psych / counseling, declined  Plan: 1. Discussion on complexity of mental health med management - Agree to switch med again today, different SSRI - from Celexa 85m taper down instructions over 4 weeks given, then start Sertraline 526mdaily new rx sent.  Future, may benefit from therapy / psych      Relevant Medications   sertraline (ZOLOFT) 50 MG tablet   Morbid obesity (HCC)   Relevant Medications   metFORMIN (GLUCOPHAGE) 500 MG tablet   GAD (generalized anxiety disorder)    See A&P major depression      Relevant Medications   sertraline (ZOLOFT) 50 MG tablet   Essential hypertension - Primary    Uncontrolled BP, elevated lately, ineffective HCTZ - Home BP readings variable  No known complications    Plan:  1. New start ARB Losartan 5050maily, she was on ACEi Lisinopril previously and did fine, did not have problem with HyperK or CKD. Will defer lab check since she is out of [place] will be getting labs including chemistry when she returns in approx 03/2020 2. Encourage improved  lifestyle - low sodium diet, regular exercise 3. Continue monitor BP outside office, bring readings to next visit, if persistently >140/90 or new symptoms notify office sooner  Advised may warrant consultation with her Cardiologist on BP if uncontrolled. May consider secondary causes of HTN as well, possible OSA or  other hormonal etiology.      Relevant Medications   pravastatin (PRAVACHOL) 10 MG tablet   losartan (COZAAR) 50 MG tablet    Other Visit Diagnoses    Oral ulcer       Relevant Medications   chlorhexidine (PERIDEX) 0.12 % solution   Type 2 diabetes, uncontrolled, with neuropathy (HCC)       Relevant Medications   pravastatin (PRAVACHOL) 10 MG tablet   losartan (COZAAR) 50 MG tablet   metFORMIN (GLUCOPHAGE) 500 MG tablet     #T2DM She is due for A1c, last done 03/2019, she was asked to schedule in person visit for lab or follow-up within 3-4 months, can do 03/2020 for yearly medicare check up and lab work.  #Oral ulcer Prior similar problem Request ed refill Chlorhexidine PRN mouthwash  Meds ordered this encounter  Medications  . losartan (COZAAR) 50 MG tablet    Sig: Take 1 tablet (50 mg total) by mouth daily.    Dispense:  90 tablet    Refill:  0  . sertraline (ZOLOFT) 50 MG tablet    Sig: Take 1 tablet (50 mg total) by mouth daily.    Dispense:  90 tablet    Refill:  0  . chlorhexidine (PERIDEX) 0.12 % solution    Sig: 5 mLs by Mouth Rinse route daily as needed (mouth sores).    Dispense:  120 mL    Refill:  0    Follow-up: Advised her that if she decides to stay in [place] longer than 3 months at a time, she may need to relocate to PCP in [place]. Or find an additional primary care doctor there to help her, as I cannot manage her meds if she stays in [facility] longer term. She understands and is mostly in [place] but will now be in [facility] for about 1 month. I agreed to send 90 day rx to [place] for her for this time.  - Return in 3-4 months for Yearly Medicare Check up  / labs A1c  Patient verbalizes understanding with the above medical recommendations including the limitation of remote medical advice.  Specific follow-up and call-back criteria were given for patient to follow-up or seek medical care more urgently if needed.   - Time spent in direct consultation with patient on phone: 15 minutes   Nobie Putnam, [facility] 11/20/2019, 11:50 AM

## 2019-11-30 ENCOUNTER — Ambulatory Visit: Payer: Self-pay

## 2019-11-30 DIAGNOSIS — I10 Essential (primary) hypertension: Secondary | ICD-10-CM

## 2019-11-30 DIAGNOSIS — K219 Gastro-esophageal reflux disease without esophagitis: Secondary | ICD-10-CM

## 2019-11-30 MED ORDER — ESOMEPRAZOLE MAGNESIUM 40 MG PO CPDR: 40.0000 mg | DELAYED_RELEASE_CAPSULE | Freq: Every day | ORAL | 0 refills | Status: AC

## 2019-11-30 NOTE — Telephone Encounter (Signed)
Notified by Erin Potter CMA regarding this phone call by patient.  I advised she may double her dose of Losartan from 50mg  daily up to 2 pills = 100mg  daily if it is ineffective at 50mg  dose.  She can start that today.  Use existing pills has 90 day supply. She can try this for 1-2 weeks and if BP controlled she can call us to request order new rx Losartan 100mg  daily.  If she prefers, I can DC Losartan and switch back to Lisinopril which she was previously on in past.  Also I agreed to DC Pantoprazole and switch to Esomeprazole 40mg . New rx sent  Erin Potter, [facility] 11/30/2019, 3:13 PM

## 2019-11-30 NOTE — Telephone Encounter (Signed)
Patient called stating that she is experiencing high BP  She states that her medication has been changed recently and the lowest BP has been 168/90. Today she is 193/96. She is not at home and can not recheck BPat this time.  She states she is nauseated and has an extreme headache that will not go away.  She has no other symptoms. Patient statis that she just wants to know how much medication she should take.  She denies missing doses. Per protocol patient was told that she should go to ER for evaluation.  She has refused stating again that she just wants to know if she should take more medication. I Contacted FC Nisha and call was forwarded to her.  Reason for Disposition . [id number] Systolic BP  >= 0000000 OR Diastolic >= [id number] AND [id number] cardiac or neurologic symptoms (e.g., chest pain, difficulty breathing, unsteady gait, blurred vision)  Answer Assessment - Initial Assessment Questions 1. BLOOD PRESSURE: "What is the blood pressure?" "Did you take at least two measurements 5 minutes apart?"     193/96, lowest 168/90 2. ONSET: "When did you take your blood pressure?"     Going up  3. HOW: "How did you obtain the blood pressure?" (e.g., visiting nurse, automatic home BP monitor)    Home 4. HISTORY: "Do you have a history of high blood pressure?"     yes 5. MEDICATIONS: "Are you taking any medications for blood pressure?" "Have you missed any doses recently?"     No missed just started on new medication 6. OTHER SYMPTOMS: "Do you have any symptoms?" (e.g., headache, chest pain, blurred vision, difficulty breathing, weakness)     Headache, upset stomach 7. PREGNANCY: "Is there any chance you are pregnant?" "When was your last menstrual period?"    N/A  Protocols used: HIGH BLOOD PRESSURE-A-AH

## 2020-01-08 ENCOUNTER — Other Ambulatory Visit: Payer: Self-pay | Admitting: Family Medicine

## 2020-01-08 ENCOUNTER — Telehealth: Payer: Self-pay | Admitting: Family Medicine

## 2020-01-08 DIAGNOSIS — I10 Essential (primary) hypertension: Secondary | ICD-10-CM

## 2020-01-08 DIAGNOSIS — F331 Major depressive disorder, recurrent, moderate: Secondary | ICD-10-CM

## 2020-01-08 DIAGNOSIS — F411 Generalized anxiety disorder: Secondary | ICD-10-CM

## 2020-01-08 DIAGNOSIS — E1169 Type 2 diabetes mellitus with other specified complication: Secondary | ICD-10-CM

## 2020-01-08 MED ORDER — LOSARTAN POTASSIUM 50 MG PO TABS: 100.0000 mg | ORAL_TABLET | Freq: Every day | ORAL | 1 refills | Status: DC

## 2020-01-08 NOTE — Telephone Encounter (Signed)
losartan (COZAAR) 50 MG tablet  Patient requesting new RX as she takes x2 a day. Patient is out of this medication.    And pravastatin (PRAVACHOL) 10 MG tablet     Patient is requesting a refill.    Pharmacy:   [id number] - [place], [address] Phone:  [phone]  Fax:  [phone]

## 2020-01-08 NOTE — Telephone Encounter (Signed)
Patient calling to state one of her medications is not working. She states it was a replacement for Citalopram but does not know the name of the new/replacment medication. Please advise.

## 2020-01-08 NOTE — Telephone Encounter (Signed)
Requested medication (s) are due for refill today: -  Requested medication (s) are on the active medication list: no  Last refill:  11/20/19  Future visit scheduled: no  Notes to clinic:  historical med and provider   Requested Prescriptions  Pending Prescriptions Disp Refills   pravastatin (PRAVACHOL) 10 MG tablet      Sig: Take 1 tablet (10 mg total) by mouth daily.      Cardiovascular:  Antilipid - Statins Failed - 01/08/2020  4:48 PM      Failed - LDL in normal range and within 360 days    LDL Cholesterol (Calc)  Date Value Ref Range Status  04/11/2019 109 (H) mg/dL (calc) Final    Comment:    Reference range: <100 . Desirable range <100 mg/dL for primary prevention;   <70 mg/dL for patients with CHD or diabetic patients  with > or = 2 CHD risk factors. Marland Kitchen LDL-C is now calculated using the Martin-Hopkins  calculation, which is a validated novel method providing  better accuracy than the Friedewald equation in the  estimation of LDL-C.  Cresenciano Genre et al. Annamaria Helling. 8756;433(29): 2061-2068  (http://education.[facility].com/faq/FAQ164)           Failed - HDL in normal range and within 360 days    HDL  Date Value Ref Range Status  04/11/2019 45 (L) > OR = 50 mg/dL Final  05/30/2015 43 >39 mg/dL Final          Passed - Total Cholesterol in normal range and within 360 days    Cholesterol, Total  Date Value Ref Range Status  05/30/2015 169 100 - 199 mg/dL Final   Cholesterol  Date Value Ref Range Status  04/11/2019 177 <200 mg/dL Final          Passed - Triglycerides in normal range and within 360 days    Triglycerides  Date Value Ref Range Status  04/11/2019 124 <150 mg/dL Final          Passed - Patient is not pregnant      Passed - Valid encounter within last 12 months    Recent Outpatient Visits           1 month ago Essential hypertension   [place], Devonne Doughty, DO   5 months ago Essential hypertension   [place], DO   7 months ago Type 2 diabetes, controlled, with peripheral neuropathy [facility])   [facility] [name], DO   1 year ago Type 2 diabetes, uncontrolled, with neuropathy [facility])   [facility] [name], DO   1 year ago Type 2 diabetes, controlled, with peripheral neuropathy [facility])   [facility], Devonne Doughty, DO               Signed Prescriptions Disp Refills   losartan (COZAAR) 50 MG tablet 180 tablet 1    Sig: Take 2 tablets (100 mg total) by mouth daily.      Cardiovascular:  Angiotensin Receptor Blockers Failed - 01/08/2020  4:48 PM      Failed - Cr in normal range and within 180 days    Creat  Date Value Ref Range Status  04/11/2019 0.60 0.50 - 1.05 mg/dL Final    Comment:    For patients >89 years of age, the reference limit for Creatinine is approximately 13% higher for people identified as African-American. Marland Kitchen  Failed - K in normal range and within 180 days    Potassium  Date Value Ref Range Status  04/11/2019 4.3 3.5 - 5.3 mmol/L Final  04/02/2014 3.8 3.5 - 5.1 mmol/L Final          Failed - Last BP in normal range    BP Readings from Last 1 Encounters:  05/14/19 (!) 173/79          Passed - Patient is not pregnant      Passed - Valid encounter within last 6 months    Recent Outpatient Visits           1 month ago Essential hypertension   [place], Devonne Doughty, DO   5 months ago Essential hypertension   [place], DO   7 months ago Type 2 diabetes, controlled, with peripheral neuropathy [facility])   [place], DO   1 year ago Type 2 diabetes, uncontrolled, with neuropathy [facility])   [place], DO   1 year ago Type 2 diabetes, controlled, with peripheral  neuropathy [facility])   [place], Devonne Doughty, DO

## 2020-01-08 NOTE — Telephone Encounter (Signed)
Requested Prescriptions  Pending Prescriptions Disp Refills  . losartan (COZAAR) 50 MG tablet 180 tablet 1    Sig: Take 2 tablets (100 mg total) by mouth daily.     Cardiovascular:  Angiotensin Receptor Blockers Failed - 01/08/2020  4:48 PM      Failed - Cr in normal range and within 180 days    Creat  Date Value Ref Range Status  04/11/2019 0.60 0.50 - 1.05 mg/dL Final    Comment:    For patients >59 years of age, the reference limit for Creatinine is approximately 13% higher for people identified as African-American. .          Failed - K in normal range and within 180 days    Potassium  Date Value Ref Range Status  04/11/2019 4.3 3.5 - 5.3 mmol/L Final  04/02/2014 3.8 3.5 - 5.1 mmol/L Final         Failed - Last BP in normal range    BP Readings from Last 1 Encounters:  05/14/19 (!) 173/79         Passed - Patient is not pregnant      Passed - Valid encounter within last 6 months    Recent Outpatient Visits          1 month ago Essential hypertension   [place], Devonne Doughty, DO   5 months ago Essential hypertension   [place], DO   7 months ago Type 2 diabetes, controlled, with peripheral neuropathy [facility])   [facility] [name], DO   1 year ago Type 2 diabetes, uncontrolled, with neuropathy [facility])   [place], DO   1 year ago Type 2 diabetes, controlled, with peripheral neuropathy [facility])   [facility], Devonne Doughty, DO             . pravastatin (PRAVACHOL) 10 MG tablet      Sig: Take 1 tablet (10 mg total) by mouth daily.     Cardiovascular:  Antilipid - Statins Failed - 01/08/2020  4:48 PM      Failed - LDL in normal range and within 360 days    LDL Cholesterol (Calc)  Date Value Ref Range Status  04/11/2019 109 (H) mg/dL (calc) Final    Comment:    Reference range:  <100 . Desirable range <100 mg/dL for primary prevention;   <70 mg/dL for patients with CHD or diabetic patients  with > or = 2 CHD risk factors. Marland Kitchen LDL-C is now calculated using the Martin-Hopkins  calculation, which is a validated novel method providing  better accuracy than the Friedewald equation in the  estimation of LDL-C.  Cresenciano Genre et al. Annamaria Helling. 6440;347(42): 2061-2068  (http://education.[facility].com/faq/FAQ164)          Failed - HDL in normal range and within 360 days    HDL  Date Value Ref Range Status  04/11/2019 45 (L) > OR = 50 mg/dL Final  05/30/2015 43 >39 mg/dL Final         Passed - Total Cholesterol in normal range and within 360 days    Cholesterol, Total  Date Value Ref Range Status  05/30/2015 169 100 - 199 mg/dL Final   Cholesterol  Date Value Ref Range Status  04/11/2019 177 <200 mg/dL Final         Passed - Triglycerides in normal range and within 360 days  Triglycerides  Date Value Ref Range Status  04/11/2019 124 <150 mg/dL Final         Passed - Patient is not pregnant      Passed - Valid encounter within last 12 months    Recent Outpatient Visits          1 month ago Essential hypertension   [place], Devonne Doughty, DO   5 months ago Essential hypertension   [place], DO   7 months ago Type 2 diabetes, controlled, with peripheral neuropathy [facility])   [place], DO   1 year ago Type 2 diabetes, uncontrolled, with neuropathy [facility])   [place], DO   1 year ago Type 2 diabetes, controlled, with peripheral neuropathy [facility])   [facility], Devonne Doughty, DO

## 2020-01-09 ENCOUNTER — Other Ambulatory Visit: Payer: Self-pay

## 2020-01-09 DIAGNOSIS — I10 Essential (primary) hypertension: Secondary | ICD-10-CM

## 2020-01-09 NOTE — Telephone Encounter (Signed)
Called pt could not hear her call was lost. Tired to call back left VM. Need to know what the pts symptoms are with Zoloft and whats going on so we can better access her.  Will route result note to [facility] Nurse Triage for follow up when patient returns call to clinic.     KP

## 2020-01-09 NOTE — Telephone Encounter (Signed)
Patient wants a refill for Pravastatin for 90 days also wants to know about losartan can be Rx with 100 mg instead of 50 mg and Zoloft is only helping for anxiety but not depression, please suggest ?

## 2020-01-10 MED ORDER — LOSARTAN POTASSIUM 100 MG PO TABS: 100.0000 mg | ORAL_TABLET | Freq: Every day | ORAL | 1 refills | Status: DC

## 2020-01-10 MED ORDER — PRAVASTATIN SODIUM 10 MG PO TABS: 10.0000 mg | ORAL_TABLET | Freq: Every day | ORAL | 1 refills | Status: AC

## 2020-01-10 MED ORDER — SERTRALINE HCL 50 MG PO TABS: ORAL_TABLET | ORAL | 0 refills | Status: AC

## 2020-01-10 NOTE — Telephone Encounter (Signed)
Left detail message. 

## 2020-01-10 NOTE — Telephone Encounter (Signed)
Spoke to pt yesterday 01/09/2020. Pt wanted RF on Pravastatin and had questions about losartin. Also stated that Zoloft was not work for depression it was only working for anxiety.  KP

## 2020-01-10 NOTE — Telephone Encounter (Signed)
New rx sent.  Instructions are to increase to 100mg  (or 50mg  x 2) daily, gave her 90 day or 180 pills  She should not take it BID. It should be 2 pills at same time for 100mg  once daily.  Nobie Putnam, [facility] 01/10/2020, 2:22 PM

## 2020-01-10 NOTE — Telephone Encounter (Signed)
Sent rx Losartan 100mg  daily, 90 day, Sent rx Pravastatin 90 day.  She can increase Sertraline from 50 up to 75 (she can take 1.5 or if preferred can take 2 for 100mg  daily) if needed to see if it helps mood more.  Nobie Putnam, [facility] 01/10/2020, 1:09 PM

## 2020-01-10 NOTE — Telephone Encounter (Signed)
Sent rx Losartan 100mg  daily, 90 day, Sent rx Pravastatin 90 day.  She can increase Sertraline from 50 up to 75 (she can take 1.5 or if preferred can take 2 for 100mg  daily) if needed to see if it helps mood more.  Nobie Putnam, [facility] 01/10/2020, 1:09 PM

## 2020-01-10 NOTE — Telephone Encounter (Signed)
Spoke to the patient she want Zoloft 50 mg Rx to be send for b.i.d so she won't run out instead of 50 mg once daily.

## 2020-01-11 NOTE — Telephone Encounter (Signed)
Phone call to pt.  Left vm that this was a follow-up call to confirm that pt. rec'd message on 7/1, re: medication recommendations per Dr. Raliegh Ip.  Advised to call office if pt. has any questions.

## 2020-01-28 ENCOUNTER — Ambulatory Visit: Payer: Self-pay

## 2020-01-28 ENCOUNTER — Other Ambulatory Visit: Payer: Self-pay | Admitting: Family Medicine

## 2020-01-28 DIAGNOSIS — I10 Essential (primary) hypertension: Secondary | ICD-10-CM

## 2020-01-28 MED ORDER — LISINOPRIL 20 MG PO TABS: 20.0000 mg | ORAL_TABLET | Freq: Every day | ORAL | 0 refills | Status: AC

## 2020-01-28 NOTE — Telephone Encounter (Signed)
Yes. Can take with HCTZ.  Will send order  Nobie Putnam, [place] [facility] 01/28/2020, 5:45 PM

## 2020-01-28 NOTE — Telephone Encounter (Signed)
Alright. We can switch Losartan back to Lisinopril.  Once you speak with her later today, let me know which pharmacy to send, we can do Lisinopril 20mg  once daily, then she may need to increase up to Lisinopril 40mg  in future.  Nobie Putnam, [place] [facility] 01/28/2020, 12:45 PM

## 2020-01-28 NOTE — Telephone Encounter (Signed)
That was her second question --Wants to know if she can take HCTZ with it ? Thank you.

## 2020-01-28 NOTE — Addendum Note (Signed)
Addended by: [name] on: 01/28/2020 05:45 PM   Modules accepted: Orders

## 2020-01-28 NOTE — Telephone Encounter (Signed)
Pt. Reports her BP "has been running high for 6 days." Reports she has a new BP monitor. This morning it is 186/93. Has been 170-180/80-90. Has also had a headache with this. Requesting to go back on Lisinopril. Wants to know if she can take HCTZ with it. Offered OV, declined. Please advise pt.  Answer Assessment - Initial Assessment Questions 1. BLOOD PRESSURE: "What is the blood pressure?" "Did you take at least two measurements 5 minutes apart?"    186/93 2. ONSET: "When did you take your blood pressure?"      6 Days 3. HOW: "How did you obtain the blood pressure?" (e.g., visiting nurse, automatic home BP monitor)     Home monitor 4. HISTORY: "Do you have a history of high blood pressure?"     Yes 5. MEDICATIONS: "Are you taking any medications for blood pressure?" "Have you missed any doses recently?"     No missed doses 6. OTHER SYMPTOMS: "Do you have any symptoms?" (e.g., headache, chest pain, blurred vision, difficulty breathing, weakness)     Headache 7. PREGNANCY: "Is there any chance you are pregnant?" "When was your last menstrual period?"     No  Protocols used: BLOOD PRESSURE - HIGH-A-AH

## 2020-01-28 NOTE — Telephone Encounter (Signed)
Patient informed as per Dr Raliegh Ip and she wants [place], [place]. As per patient she will be leaving [place] by end of the month.

## 2020-01-29 ENCOUNTER — Encounter: Payer: Self-pay | Admitting: Family Medicine

## 2020-01-29 ENCOUNTER — Telehealth: Payer: Self-pay | Admitting: Family Medicine

## 2020-01-29 NOTE — Telephone Encounter (Signed)
Pt states Dr. Parks Ranger had called in prescription for Lisinopril. States BP presently 192/100. Questioning if she should take the lisinopril when she picks it up at pharmacy. States "I'm on my way now." Advised to take med. States "Well I was going to anyway, Thought I'd ask.' Pt hung up before providing additional advise/assessment.

## 2020-01-29 NOTE — Telephone Encounter (Signed)
This encounter was created in error - please disregard.

## 2020-01-30 NOTE — Telephone Encounter (Signed)
Pt called in bp is still high. She refused to talk to a triage nurse .  She wants to talk with Dr nurse only about her bp and her meds.  She stated she deal with this all the time.  Only symptom she was having was a little headache.     Best number [phone]

## 2020-01-30 NOTE — Telephone Encounter (Signed)
According to the message here it says that the patient has not started to take the Lisinopril yet.  Lisinopril 20mg  daily was prescribed 2 days ago on 01/28/20.  She should have already stopped the Losartan previously.  This most likely the reason for her elevated BP.  She needs to pick up Lisinopril start taking 20mg  daily and monitor BP.  If persistent elevation >160/100, she should consider a more acute evaluation at Urgent Care or Hospital if severe elevated BP and if any symptoms.  She may need to eventually increase the dose of Lisinopril up to 40mg . But she needs to start with the 20mg  dose first.  Nobie Putnam, DO [facility] 01/30/2020, 12:13 PM

## 2020-01-30 NOTE — Telephone Encounter (Signed)
Left detail message. 

## 2020-01-30 NOTE — Telephone Encounter (Signed)
Left message

## 2020-02-13 ENCOUNTER — Ambulatory Visit (INDEPENDENT_AMBULATORY_CARE_PROVIDER_SITE_OTHER): Payer: Self-pay | Admitting: FAMILY MEDICINE

## 2020-02-20 ENCOUNTER — Encounter (INDEPENDENT_AMBULATORY_CARE_PROVIDER_SITE_OTHER): Payer: Self-pay | Admitting: FAMILY MEDICINE

## 2020-02-20 ENCOUNTER — Ambulatory Visit: Payer: Medicare PPO | Attending: FAMILY MEDICINE | Admitting: FAMILY MEDICINE

## 2020-02-20 ENCOUNTER — Other Ambulatory Visit: Payer: Self-pay

## 2020-02-20 ENCOUNTER — Ambulatory Visit
Admission: RE | Admit: 2020-02-20 | Discharge: 2020-02-20 | Disposition: A | Discharge status: Home / Self Care | Payer: Medicare PPO | Source: Ambulatory Visit | Attending: FAMILY MEDICINE | Admitting: FAMILY MEDICINE

## 2020-02-20 ENCOUNTER — Ambulatory Visit (HOSPITAL_COMMUNITY): Payer: Medicare PPO

## 2020-02-20 ENCOUNTER — Telehealth (INDEPENDENT_AMBULATORY_CARE_PROVIDER_SITE_OTHER): Payer: Self-pay | Admitting: FAMILY MEDICINE

## 2020-02-20 VITALS — BP 131/77 | HR 80 | Temp 97.2°F | Resp 18 | Ht 64.0 in | Wt 225.0 lb

## 2020-02-20 DIAGNOSIS — Z7951 Long term (current) use of inhaled steroids: Secondary | ICD-10-CM | POA: Insufficient documentation

## 2020-02-20 DIAGNOSIS — Z87891 Personal history of nicotine dependence: Secondary | ICD-10-CM | POA: Insufficient documentation

## 2020-02-20 DIAGNOSIS — F321 Major depressive disorder, single episode, moderate: Secondary | ICD-10-CM | POA: Insufficient documentation

## 2020-02-20 DIAGNOSIS — K219 Gastro-esophageal reflux disease without esophagitis: Secondary | ICD-10-CM | POA: Insufficient documentation

## 2020-02-20 DIAGNOSIS — E038 Other specified hypothyroidism: Secondary | ICD-10-CM

## 2020-02-20 DIAGNOSIS — E782 Mixed hyperlipidemia: Secondary | ICD-10-CM | POA: Insufficient documentation

## 2020-02-20 DIAGNOSIS — F5101 Primary insomnia: Secondary | ICD-10-CM | POA: Insufficient documentation

## 2020-02-20 DIAGNOSIS — I10 Essential (primary) hypertension: Secondary | ICD-10-CM | POA: Insufficient documentation

## 2020-02-20 DIAGNOSIS — Z6838 Body mass index (BMI) 38.0-38.9, adult: Secondary | ICD-10-CM | POA: Insufficient documentation

## 2020-02-20 DIAGNOSIS — Z124 Encounter for screening for malignant neoplasm of cervix: Secondary | ICD-10-CM

## 2020-02-20 DIAGNOSIS — E1165 Type 2 diabetes mellitus with hyperglycemia: Secondary | ICD-10-CM | POA: Insufficient documentation

## 2020-02-20 DIAGNOSIS — J410 Simple chronic bronchitis: Secondary | ICD-10-CM | POA: Insufficient documentation

## 2020-02-20 DIAGNOSIS — E063 Autoimmune thyroiditis: Secondary | ICD-10-CM

## 2020-02-20 DIAGNOSIS — M5136 Other intervertebral disc degeneration, lumbar region: Secondary | ICD-10-CM

## 2020-02-20 DIAGNOSIS — M549 Dorsalgia, unspecified: Secondary | ICD-10-CM

## 2020-02-20 DIAGNOSIS — E559 Vitamin D deficiency, unspecified: Secondary | ICD-10-CM | POA: Insufficient documentation

## 2020-02-20 DIAGNOSIS — B372 Candidiasis of skin and nail: Secondary | ICD-10-CM

## 2020-02-20 DIAGNOSIS — R7989 Other specified abnormal findings of blood chemistry: Secondary | ICD-10-CM | POA: Insufficient documentation

## 2020-02-20 DIAGNOSIS — J4520 Mild intermittent asthma, uncomplicated: Secondary | ICD-10-CM | POA: Insufficient documentation

## 2020-02-20 DIAGNOSIS — J449 Chronic obstructive pulmonary disease, unspecified: Secondary | ICD-10-CM | POA: Insufficient documentation

## 2020-02-20 DIAGNOSIS — L219 Seborrheic dermatitis, unspecified: Secondary | ICD-10-CM | POA: Insufficient documentation

## 2020-02-20 DIAGNOSIS — M792 Neuralgia and neuritis, unspecified: Secondary | ICD-10-CM

## 2020-02-20 DIAGNOSIS — M47816 Spondylosis without myelopathy or radiculopathy, lumbar region: Secondary | ICD-10-CM

## 2020-02-20 DIAGNOSIS — Z7984 Long term (current) use of oral hypoglycemic drugs: Secondary | ICD-10-CM | POA: Insufficient documentation

## 2020-02-20 LAB — CBC WITH DIFF
BASOPHIL #: <0.1 10*3/uL (ref <=0.20)
BASOPHIL %: 1 %
EOSINOPHIL #: 0.32 10*3/uL (ref <=0.50)
EOSINOPHIL %: 5 %
HCT: 39.4 % (ref 34.8–46.0)
HGB: 12.4 g/dL (ref 11.5–16.0)
IMMATURE GRANULOCYTE #: <0.1 10*3/uL (ref <0.10)
IMMATURE GRANULOCYTE %: 0 % (ref 0–1)
LYMPHOCYTE #: 2.27 10*3/uL (ref 1.00–4.80)
LYMPHOCYTE %: 32 %
MCH: 26.9 pg (ref 26.0–32.0)
MCHC: 31.5 g/dL (ref 31.0–35.5)
MCV: 85.5 fL (ref 78.0–100.0)
MONOCYTE #: 0.55 10*3/uL (ref 0.20–1.10)
MONOCYTE %: 8 %
MPV: 12.1 fL (ref 8.7–12.5)
NEUTROPHIL #: 3.95 10*3/uL (ref 1.50–7.70)
NEUTROPHIL %: 54 %
PLATELETS: 128 10*3/uL — ABNORMAL LOW (ref 150–400)
RBC: 4.61 10*6/uL (ref 3.85–5.22)
RDW-CV: 15.1 % (ref 11.5–15.5)
WBC: 7.2 10*3/uL (ref 3.7–11.0)

## 2020-02-20 LAB — COMPREHENSIVE METABOLIC PNL, FASTING
ALBUMIN/GLOBULIN RATIO: 1.5 — ABNORMAL LOW (ref 2.5–4.0)
ALBUMIN: 4.4 g/dL (ref 3.5–5.2)
ALKALINE PHOSPHATASE: 113 U/L — ABNORMAL HIGH (ref 35–104)
ALT (SGPT): 37 U/L — ABNORMAL HIGH (ref 0–33)
ANION GAP: 10 mmol/L (ref 10–21)
AST (SGOT): 52 U/L — ABNORMAL HIGH (ref 0–32)
BILIRUBIN TOTAL: 0.3 mg/dL (ref 0.0–1.0)
BUN/CREA RATIO: 25
BUN: 16 mg/dL (ref 6–20)
CALCIUM: 9.8 mg/dL (ref 8.4–10.5)
CHLORIDE: 103 mmol/L (ref 96–107)
CO2 TOTAL: 29 mmol/L (ref 22–29)
CREATININE: 0.65 mg/dL (ref 0.50–0.90)
ESTIMATED GFR: >60 mL/min/{1.73_m2} (ref >60)
GLUCOSE: 112 mg/dL — ABNORMAL HIGH (ref 74–106)
POTASSIUM: 4.3 mmol/L (ref 3.5–5.1)
PROTEIN TOTAL: 7.4 g/dL (ref 6.6–8.7)
SODIUM: 142 mmol/L (ref 136–145)

## 2020-02-20 LAB — VITAMIN D 25 TOTAL: VITAMIN D 25, TOTAL: 40.8 ng/mL (ref 30.00–100.00)

## 2020-02-20 LAB — MICROALBUMIN/CREATININE RATIO, URINE, RANDOM
CREATININE RANDOM URINE: 45 mg/dL (ref 30–125)
MICROALBUMIN RANDOM URINE: <1.3 mg/dL (ref 0.0–2.0)

## 2020-02-20 LAB — LIPID PANEL
CHOLESTEROL: 162 mg/dL (ref 100–170)
HDL CHOL: 42 mg/dL (ref 30–65)
LDL CALC: 90 mg/dL (ref 0–130)
TRIGLYCERIDES: 150 mg/dL (ref 0–200)
VLDL CALC: 30 mg/dL (ref 5–40)

## 2020-02-20 LAB — HGA1C (HEMOGLOBIN A1C WITH EST AVG GLUCOSE)
ESTIMATED AVERAGE GLUCOSE: 171 mg/dL
HEMOGLOBIN A1C: 7.6 % — ABNORMAL HIGH (ref 4.8–5.9)

## 2020-02-20 LAB — THYROID STIMULATING HORMONE (SENSITIVE TSH): TSH: 0.903 u[IU]/mL (ref 0.270–4.200)

## 2020-02-20 MED ORDER — BLOOD SUGAR DIAGNOSTIC STRIPS
1.0000 | ORAL_STRIP | Freq: Four times a day (QID) | 11 refills | Status: DC
Start: 2020-02-20 — End: 2023-06-01

## 2020-02-20 MED ORDER — PRAVASTATIN 10 MG TABLET
10.0000 mg | ORAL_TABLET | Freq: Every evening | ORAL | 1 refills | Status: DC
Start: 2020-02-20 — End: 2020-08-26

## 2020-02-20 MED ORDER — KETOCONAZOLE 2 % SHAMPOO
MEDICATED_SHAMPOO | CUTANEOUS | 5 refills | Status: DC
Start: 2020-02-20 — End: 2020-03-13

## 2020-02-20 MED ORDER — BUDESONIDE-FORMOTEROL HFA 160 MCG-4.5 MCG/ACTUATION AEROSOL INHALER
2.0000 | INHALATION_SPRAY | Freq: Two times a day (BID) | RESPIRATORY_TRACT | 1 refills | Status: DC
Start: 2020-02-20 — End: 2020-10-14

## 2020-02-20 MED ORDER — BACLOFEN 10 MG TABLET
10.0000 mg | ORAL_TABLET | Freq: Four times a day (QID) | ORAL | 1 refills | Status: DC
Start: 2020-02-20 — End: 2020-10-14

## 2020-02-20 MED ORDER — SERTRALINE 100 MG TABLET
100.0000 mg | ORAL_TABLET | Freq: Every day | ORAL | 1 refills | Status: DC
Start: 2020-02-20 — End: 2020-03-13

## 2020-02-20 MED ORDER — LEVOTHYROXINE 100 MCG TABLET
175.0000 ug | ORAL_TABLET | Freq: Every morning | ORAL | 1 refills | Status: DC
Start: 2020-02-20 — End: 2020-02-20

## 2020-02-20 MED ORDER — METFORMIN 500 MG TABLET
500.0000 mg | ORAL_TABLET | Freq: Two times a day (BID) | ORAL | 4 refills | Status: DC
Start: 2020-02-20 — End: 2021-03-05

## 2020-02-20 MED ORDER — LANCETS
1.0000 | Freq: Four times a day (QID) | 11 refills | Status: DC
Start: 2020-02-20 — End: 2023-06-01

## 2020-02-20 MED ORDER — OMEPRAZOLE 40 MG CAPSULE,DELAYED RELEASE
40.0000 mg | DELAYED_RELEASE_CAPSULE | Freq: Every day | ORAL | 1 refills | Status: DC
Start: 2020-02-20 — End: 2020-02-28

## 2020-02-20 MED ORDER — GABAPENTIN 400 MG CAPSULE
400.0000 mg | ORAL_CAPSULE | Freq: Three times a day (TID) | ORAL | 1 refills | Status: DC
Start: 2020-02-20 — End: 2020-08-26

## 2020-02-20 MED ORDER — LISINOPRIL 20 MG TABLET
20.0000 mg | ORAL_TABLET | Freq: Two times a day (BID) | ORAL | 1 refills | Status: DC
Start: 2020-02-20 — End: 2020-09-10

## 2020-02-20 MED ORDER — HYDROCHLOROTHIAZIDE 25 MG TABLET
25.0000 mg | ORAL_TABLET | Freq: Every day | ORAL | 1 refills | Status: DC
Start: 2020-02-20 — End: 2020-09-10

## 2020-02-20 MED ORDER — NYSTATIN 100,000 UNIT/GRAM TOPICAL CREAM
TOPICAL_CREAM | CUTANEOUS | 2 refills | Status: DC
Start: 2020-02-20 — End: 2021-07-24

## 2020-02-20 NOTE — Telephone Encounter (Signed)
Please change her 1 month appointment to a Medicare wellness exam.

## 2020-02-20 NOTE — Telephone Encounter (Signed)
[facility] called and they have a prescription clarification question on the synthroid medication that was sent over.      Thanks    [name], Unit Clerk  02/20/2020, 15:06

## 2020-02-20 NOTE — Nursing Note (Signed)
Travel Screening     Question   Response    In the last month, have you been in contact with someone who was confirmed or suspected to have Coronavirus / COVID-19?  No / Unsure    Have you had a COVID-19 viral test in the last 14 days?  No    Do you have any of the following new or worsening symptoms?  None of these    Have you traveled internationally or domestically in the last month?  No      Travel History   Travel since 01/20/20     No documented travel since 01/20/20

## 2020-02-20 NOTE — Nursing Note (Signed)
02/20/20 1300   Depression Screen   Little interest or pleasure in doing things. 3   Feeling down, depressed, or hopeless 3   PHQ 2 Total 6   Trouble falling or staying asleep, or sleeping too much. 3   Feeling tired or having little energy 3   Poor appetite or overeating 3   Feeling bad about yourself/ that you are a failure in the past 2 weeks? 0   Trouble concentrating on things in the past 2 weeks? 0   Moving/Speaking slowly or being fidgety or restless  in the past 2 weeks? 0   Thoughts that you would be better off DEAD, or of hurting yourself in some way. 0   If you checked off any problems, how difficult have these problems made it for you to do your work, take care of things at home, or get along with other people? Very difficult   PHQ 9 Total 15   Interpretation of Total Score Moderate/Severe depression
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
## 2020-02-20 NOTE — Nursing Note (Signed)
02/20/20 1334   Fall Risk Assessment   Do you feel unsteady when standing or walking? Yes   Do you worry about falling? Yes   Have you fallen in the past year? Yes   How many times have you fallen? 2 or more times   Were you ever injured from falling? No   Timed up and go test (in seconds) 11

## 2020-02-20 NOTE — Telephone Encounter (Signed)
Levothyroxine sent for and was to be . Verbal given to Ian Malkin at [facility] for correction.     Thanks  Rodney Booze, LPN  0/14/1030, 16:58

## 2020-02-20 NOTE — Addendum Note (Signed)
Addended by: Adrian Prince on: 02/20/2020 05:01 PM     Modules accepted: Orders

## 2020-02-21 ENCOUNTER — Other Ambulatory Visit (INDEPENDENT_AMBULATORY_CARE_PROVIDER_SITE_OTHER): Payer: Self-pay | Admitting: FAMILY MEDICINE

## 2020-02-21 ENCOUNTER — Telehealth (INDEPENDENT_AMBULATORY_CARE_PROVIDER_SITE_OTHER): Payer: Self-pay | Admitting: FAMILY MEDICINE

## 2020-02-21 DIAGNOSIS — M545 Low back pain, unspecified: Secondary | ICD-10-CM

## 2020-02-21 DIAGNOSIS — R1013 Epigastric pain: Secondary | ICD-10-CM

## 2020-02-21 DIAGNOSIS — M549 Dorsalgia, unspecified: Secondary | ICD-10-CM

## 2020-02-21 NOTE — Telephone Encounter (Signed)
Pt informed of XR lumbar spine results and verbalized understanding    Thanks  Rodney Booze, LPN  02/03/2034, 16:46

## 2020-02-22 ENCOUNTER — Telehealth (HOSPITAL_COMMUNITY): Payer: Self-pay

## 2020-02-22 NOTE — Telephone Encounter (Signed)
MRI LS spine scheduled for 02/28/20 at 1045. Pt notified of date and time of exam. Requests to reschedule and transferred to RAD to complete.

## 2020-02-25 ENCOUNTER — Telehealth (INDEPENDENT_AMBULATORY_CARE_PROVIDER_SITE_OTHER): Payer: Self-pay | Admitting: FAMILY MEDICINE

## 2020-02-25 NOTE — Telephone Encounter (Signed)
Pt stated she can not take Omeprazole she needs the generic for Nexium.  She also wants to know why she was referred to Dr. Haynes Bast, she did not want to see her for her Well Woman, she said she wanted to come to you for this , she was very adamant that she did not give her approval for this referral and she also wanted to do the Well Woman exam at her 2.19 appointment which is a [facility] Wellness Exam.   I will advise patient once you let me know .      Thanks    [name], Unit Clerk  02/25/2020, 15:24

## 2020-02-26 ENCOUNTER — Other Ambulatory Visit: Payer: Self-pay

## 2020-02-26 ENCOUNTER — Ambulatory Visit: Payer: Medicare PPO | Attending: Foot & Ankle Surgery | Admitting: Foot & Ankle Surgery

## 2020-02-26 ENCOUNTER — Encounter (INDEPENDENT_AMBULATORY_CARE_PROVIDER_SITE_OTHER): Payer: Self-pay | Admitting: Foot & Ankle Surgery

## 2020-02-26 VITALS — BP 133/69 | HR 93 | Temp 97.2°F | Resp 18 | Ht 64.0 in | Wt 225.0 lb

## 2020-02-26 DIAGNOSIS — E119 Type 2 diabetes mellitus without complications: Secondary | ICD-10-CM | POA: Insufficient documentation

## 2020-02-26 DIAGNOSIS — Z79899 Other long term (current) drug therapy: Secondary | ICD-10-CM | POA: Insufficient documentation

## 2020-02-26 DIAGNOSIS — E039 Hypothyroidism, unspecified: Secondary | ICD-10-CM | POA: Insufficient documentation

## 2020-02-26 DIAGNOSIS — Z87891 Personal history of nicotine dependence: Secondary | ICD-10-CM | POA: Insufficient documentation

## 2020-02-26 DIAGNOSIS — Z7984 Long term (current) use of oral hypoglycemic drugs: Secondary | ICD-10-CM | POA: Insufficient documentation

## 2020-02-26 DIAGNOSIS — I10 Essential (primary) hypertension: Secondary | ICD-10-CM | POA: Insufficient documentation

## 2020-02-26 DIAGNOSIS — J45909 Unspecified asthma, uncomplicated: Secondary | ICD-10-CM | POA: Insufficient documentation

## 2020-02-26 DIAGNOSIS — Z7982 Long term (current) use of aspirin: Secondary | ICD-10-CM | POA: Insufficient documentation

## 2020-02-26 DIAGNOSIS — B351 Tinea unguium: Secondary | ICD-10-CM | POA: Insufficient documentation

## 2020-02-26 DIAGNOSIS — M25571 Pain in right ankle and joints of right foot: Secondary | ICD-10-CM | POA: Insufficient documentation

## 2020-02-26 DIAGNOSIS — G8929 Other chronic pain: Secondary | ICD-10-CM | POA: Insufficient documentation

## 2020-02-26 DIAGNOSIS — Z791 Long term (current) use of non-steroidal anti-inflammatories (NSAID): Secondary | ICD-10-CM | POA: Insufficient documentation

## 2020-02-26 DIAGNOSIS — Z7951 Long term (current) use of inhaled steroids: Secondary | ICD-10-CM | POA: Insufficient documentation

## 2020-02-26 DIAGNOSIS — M21961 Unspecified acquired deformity of right lower leg: Secondary | ICD-10-CM

## 2020-02-26 MED ORDER — UREA 40 % TOPICAL CREAM
TOPICAL_CREAM | Freq: Two times a day (BID) | CUTANEOUS | 2 refills | Status: DC
Start: 2020-02-26 — End: 2023-01-05

## 2020-02-26 MED ORDER — TERBINAFINE HCL 1 % TOPICAL CREAM
TOPICAL_CREAM | Freq: Two times a day (BID) | CUTANEOUS | 2 refills | Status: DC
Start: 2020-02-26 — End: 2022-09-03

## 2020-02-26 NOTE — Telephone Encounter (Signed)
I left a VM at [phone] for pt to return my call to further discuss this issue.     Thanks  Rodney Booze, LPN  0/76/8088, 08:45

## 2020-02-26 NOTE — Telephone Encounter (Signed)
Rooming said she wanted the referral for Dr. Haynes Bast and I remember specifically asking her about this she wanted to be referred to Dr. Haynes Bast for gynecology.  Her next follow-up appointment is not for a well-woman exam it is follow-up on her labs and her medical conditions as she is a new patient.  She will have to schedule a separate appointment for a well-woman exam.

## 2020-02-28 ENCOUNTER — Ambulatory Visit: Payer: Medicare PPO | Attending: FAMILY MEDICINE | Admitting: FAMILY MEDICINE

## 2020-02-28 ENCOUNTER — Ambulatory Visit
Admission: RE | Admit: 2020-02-28 | Discharge: 2020-02-28 | Disposition: A | Discharge status: Home / Self Care | Payer: Medicare PPO | Source: Ambulatory Visit | Attending: FAMILY MEDICINE | Admitting: FAMILY MEDICINE

## 2020-02-28 ENCOUNTER — Other Ambulatory Visit: Payer: Self-pay

## 2020-02-28 ENCOUNTER — Encounter (INDEPENDENT_AMBULATORY_CARE_PROVIDER_SITE_OTHER): Payer: Self-pay | Admitting: FAMILY MEDICINE

## 2020-02-28 VITALS — BP 120/70 | HR 77 | Temp 97.3°F | Resp 19 | Ht 64.0 in | Wt 222.0 lb

## 2020-02-28 DIAGNOSIS — M545 Low back pain, unspecified: Secondary | ICD-10-CM

## 2020-02-28 DIAGNOSIS — M549 Dorsalgia, unspecified: Secondary | ICD-10-CM | POA: Insufficient documentation

## 2020-02-28 DIAGNOSIS — Z124 Encounter for screening for malignant neoplasm of cervix: Secondary | ICD-10-CM

## 2020-02-28 DIAGNOSIS — Z1231 Encounter for screening mammogram for malignant neoplasm of breast: Secondary | ICD-10-CM | POA: Insufficient documentation

## 2020-02-28 DIAGNOSIS — Z87891 Personal history of nicotine dependence: Secondary | ICD-10-CM | POA: Insufficient documentation

## 2020-02-28 DIAGNOSIS — Z1211 Encounter for screening for malignant neoplasm of colon: Secondary | ICD-10-CM | POA: Insufficient documentation

## 2020-02-28 DIAGNOSIS — N3946 Mixed incontinence: Secondary | ICD-10-CM | POA: Insufficient documentation

## 2020-02-28 DIAGNOSIS — Z23 Encounter for immunization: Secondary | ICD-10-CM

## 2020-02-28 DIAGNOSIS — K219 Gastro-esophageal reflux disease without esophagitis: Secondary | ICD-10-CM | POA: Insufficient documentation

## 2020-02-28 DIAGNOSIS — G8929 Other chronic pain: Secondary | ICD-10-CM | POA: Insufficient documentation

## 2020-02-28 DIAGNOSIS — M47816 Spondylosis without myelopathy or radiculopathy, lumbar region: Secondary | ICD-10-CM

## 2020-02-28 DIAGNOSIS — B370 Candidal stomatitis: Secondary | ICD-10-CM | POA: Insufficient documentation

## 2020-02-28 DIAGNOSIS — Z0000 Encounter for general adult medical examination without abnormal findings: Secondary | ICD-10-CM | POA: Insufficient documentation

## 2020-02-28 DIAGNOSIS — M542 Cervicalgia: Secondary | ICD-10-CM | POA: Insufficient documentation

## 2020-02-28 MED ORDER — ESOMEPRAZOLE MAGNESIUM 40 MG CAPSULE,DELAYED RELEASE
40.0000 mg | DELAYED_RELEASE_CAPSULE | Freq: Every morning | ORAL | 1 refills | Status: DC
Start: 2020-02-28 — End: 2020-10-14

## 2020-02-28 MED ORDER — NYSTATIN 100,000 UNIT/ML ORAL SUSPENSION
5.0000 mL | Freq: Four times a day (QID) | ORAL | 0 refills | Status: AC
Start: 2020-02-28 — End: 2020-03-13

## 2020-02-28 MED ORDER — OXYBUTYNIN CHLORIDE 5 MG TABLET
5.0000 mg | ORAL_TABLET | Freq: Two times a day (BID) | ORAL | 5 refills | Status: DC
Start: 2020-02-28 — End: 2020-03-13

## 2020-02-28 NOTE — Progress Notes (Signed)
FAMILY MEDICINE, [facility]  [address]  [place] [address]    Medicare Annual Wellness Visit    Name: Angela Frey MRN:  [id number]   Date: 02/28/2020 Age: 59 y.o.       SUBJECTIVE:   Angela Frey is a 59 y.o. female for presenting for Medicare Wellness exam.   I have reviewed and reconciled the medication list with the patient today.    Comprehensive Health Assessment:  Patient verbal responses recorded in flowsheet   Patient presents today for Medicare wellness exam.  She does not like to answer all the questions.  But she will do it.  She complains she has a lot of difficulty due to hand pain, shoulder pain, back pain, knee pain, hip pain and having to take care of her dad with Alzheimer's.  She does not have any help but she has to do what she has to do.  She has pain all the time in her back her neck her shoulder her hands.  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  She has seen someone at [facility] for this I do not have any records.  Will request medical records.  And if needed will refer to a hand specialist.  She also complains she gets trigger finger in both hands again once I get the records I could refer her to a specialist.  She complains of right shoulder pain and needing a right shoulder surgery.  She reports she went to [facility] and there was supposed to do shoulder surgery but she moved I will get these medical records if warranted I will refer her to someone for her right shoulder issues.  She complains of chronic neck pain she also salt [facility] for this.  She says they were working on doing some other testing to see if she needed surgery.  I will request the medical records and if warranted will refer her to someone for her neck issues.  She wants a sooner appointment with Dr. Haynes Bast.  Brandy call Dr. Haynes Bast office and got her a sooner appointment.  She does not have a medical power of attorney or living  will we gave her the forms.  She wants review her medications again.  She wants to make sure she is on metformin twice a day which she is.  She wants Nexium instead of omeprazole change in her today.  She has oral ulcers in her mouth which she gets on occasion.  She also is complaining of bladder leakage and needs to see urologist here as she has been seeing urologist at the past place she lives.  She has bladder leakage all the time.    Comprehensive Health Assessment-Adult 02/28/2020   Do you wish to complete this form? No   During the past 4 weeks, how would you rate your health in general? Fair   During the past 4 weeks, how much difficulty have you had doing your usual activities inside and outside your home because of medical or emotional problems? Much difficulty   During the past 4 weeks, was someone available to help you if you needed and wanted help? No   In the past year, how many times have you gone to the emergency department or been admitted to a hospital for a health problem? None   Have you fallen 2 or more times in the last year? No   During the  past 4 weeks, how much bodily pain have you typically had? Moderate pain   Do you have enough money to buy things you need in everyday life, such as food, clothing, medicines, and housing? Sometimes   Can you get to places beyond walking distance without help?  (For example, can you drive your own car or travel alone on buses)? Yes   Do you fasten your seatbelt when you are in a car? Yes, usually   Are you generally satisfied with your sleep? No   Over the last 2 weeks, how often have you been feeling nervous, anxious or on edge? Several days   Over the last 2 weeks, how often have you been bothered by not being able to stop or control worrying? Several days   Over the last 2 weeks, how often have you been bothered by little interest of pleasure in doing things? Several days   Over the last 2 weeks, how often have you been bothered by feeling down, depressed,  or hopeless? Several days   During the past 12 months, have you experienced confusion or memory loss that is happening more often or is getting worse? Possibly, I am not sure   Do you smoke, or use tobacco? No   If you do not smoke, how often are you in the room with someone who is smoking? Never   Do you exercise 20 minutes 3 or more days per week (such as walking, dancing, biking, mowing grass, swimming)? No, I usually don't exercise this much   How often do you eat food that is healthy (fruits, vegetables, lean meats) instead of unhealthy (sweets, fast food, junk food, fatty foods)? Almost never   During the past 4 weeks, how many drinks of wine, beer, or other alcohol-containing beverages did you have? No alcohol at all   How often do you have trouble taking medicines the eay you are told to take them? I always take them as prescribed   Do you currently use opioid pain medications, take regularly or keep on hand for use as needed (medication such as hydrocodone, oxycodone, codeine or morphine)? No   How many times in the past year have you used an illegal drug or used prescription medication for nonmedical reasons? None   Have your parents, brothers or sisters had any of the following problems before the age of 84 (check all that apply)? Heart problems or hardening of arteries;Diabetes (sugar);High Cholesterol   How confident are you that you can control or manage most of your health problems? Very confident   Do you need any help communicating with your doctors and nurses because of vision or hearing problems? No   Do you have one person you think of as your personal doctor (primary care provider or family doctor)? Yes   If you answered yes to the question above, please list the name of your provider Dr Morene Antu   Are you now also seeing any specialist physician(s) (such as eye doctor, foot doctor, skin doctor)? Yes   If you are seeing a specialist for anything such as foot, eye, skin, etc.  please list their  name(s) Dr whittington-eye, Dr Luetta Nutting- podiatry       I have reviewed and updated as appropriate the past medical, family and social history. 02/28/2020 as summarized below:  Past Medical History:   Diagnosis Date    Asthma     Diabetes (CMS HCC)     Hypertension     Hypothyroid  Past Surgical History:   Procedure Laterality Date    Hx tubal ligation      Leg surgery       Current Outpatient Medications   Medication Sig    albuterol sulfate (PROVENTIL OR VENTOLIN OR PROAIR) 90 mcg/actuation Inhalation HFA Aerosol Inhaler Take 2 Puffs by inhalation Every 6 hours as needed    albuterol sulfate (PROVENTIL) 2.5 mg /3 mL (0.083 %) Inhalation Solution for Nebulization 2.5 mg by Nebulization route Every 4 hours as needed for Wheezing    amitriptyline (ELAVIL) 25 mg Oral Tablet Take 25 mg by mouth Every night    aspirin 81 mg Oral Tablet, Chewable Take 81 mg by mouth Once a day    aspirin/salicylamide/caffeine (BC HEADACHE POWDER ORAL) Take by mouth Once per day as needed    baclofen (LIORESAL) 10 mg Oral Tablet Take 1 Tablet (10 mg total) by mouth Four times a day    Blood Sugar Diagnostic (ACCU-CHEK GUIDE TEST STRIPS) Strip 1 Strip by Does not apply route Four times a day    Blood-Glucose Meter (ACCU-CHEK GUIDE ME GLUCOSE MTR) Misc by Does not apply route 4 times per day    budesonide-formoteroL (SYMBICORT) 160-4.5 mcg/actuation Inhalation HFA Aerosol Inhaler Take 2 Puffs by inhalation Twice daily    calcium carbonate/vitamin D3 (CALCIUM 600 + D ORAL) Take by mouth Once a day    chlorhexidine gluconate (PERIDEX) 0.12 % Mucous Membrane Mouthwash 15 mL swish and spit Twice daily    esomeprazole magnesium (NEXIUM) 40 mg Oral Capsule, Delayed Release(E.C.) Take 1 Capsule (40 mg total) by mouth Every morning before breakfast    fluorouraciL (EFUDEX) 5 % Cream Apply topically Twice daily Apply to lesions as directed    furosemide (LASIX) 20 mg Oral Tablet Take 20 mg by mouth Once a day    gabapentin  (NEURONTIN) 400 mg Oral Capsule Take 1 Capsule (400 mg total) by mouth Three times a day    hydroCHLOROthiazide (HYDRODIURIL) 25 mg Oral Tablet Take 1 Tablet (25 mg total) by mouth Once a day    ketoconazole (NIZORAL) 2 % Shampoo 3x per week 1/2 to 1 oz per shampoo    Lactobac no.41/Bifidobact no.7 (PROBIOTIC-10 ORAL) Take by mouth Once a day    Lancets (ACCU-CHEK SOFTCLIX LANCETS) Misc 1 Each by Does not apply route Four times a day    levothyroxine (SYNTHROID) 175 mcg Oral Tablet Take 175 mcg by mouth Every morning    lisinopriL (PRINIVIL) 20 mg Oral Tablet Take 1 Tablet (20 mg total) by mouth Twice daily    Magnesium 250 mg Oral Tablet Take by mouth Once a day    meloxicam (MOBIC) 15 mg Oral Tablet Take 15 mg by mouth Once per day as needed for Pain    metFORMIN (GLUCOPHAGE) 500 mg Oral Tablet Take 1 Tablet (500 mg total) by mouth Twice daily with food    Milk Thistle 500 mg Oral Capsule Take by mouth Twice daily    nystatin (MYCOSTATIN) 100,000 unit/gram Cream Apply 0.5 g t.i.d. to affected area.    nystatin (MYCOSTATIN) 100,000 unit/mL Oral Suspension Take 5 mL by mouth Four times a day for 14 days    nystatin (NYSTOP) 100,000 unit/gram Powder by Apply Topically route Three times a day as needed    oxybutynin (DITROPAN) 5 mg Oral Tablet Take 1 Tablet (5 mg total) by mouth Twice daily    pediatric multivitamins Oral Tablet, Chewable Take 1 Tablet by mouth Once a day  pravastatin (PRAVACHOL) 10 mg Oral Tablet Take 1 Tablet (10 mg total) by mouth Every evening    sertraline (ZOLOFT) 100 mg Oral Tablet Take 1 Tablet (100 mg total) by mouth Once a day    terbinafine HCL (LAMISIL AT) 1 % Cream Apply topically Twice daily Apply to nails twice daily with a q tip. Apply to bottoms of feet and toes twice daily for 2-4 weeks or until resolution    tiotropium bromide (SPIRIVA HANDIHALER) 18 mcg Inhalation Capsule, w/Inhalation Device Take 18 mcg by inhalation Once a day    turmeric/turmeric ext/pepr  ext (TURMERIC-TURMERIC EXT-PEPPER) 500-3 mg Oral Capsule Take by mouth Once a day    Urea 40 % 40 % Cream by Apply Topically route Twice daily Apply a pea sized amount with a q-tip to nails twice daily     Family Medical History:    None           Social History     Socioeconomic History    Marital status: Unknown     Spouse name: Not on file    Number of children: Not on file    Years of education: Not on file    Highest education level: Not on file   Tobacco Use    Smoking status: Former Smoker    Smokeless tobacco: Never Used     Social Determinants of Psychologist, prison and probation servicesHealth     Financial Resource Strain: Medium Risk    Difficulty of Paying Living Expenses: Somewhat hard   Food Insecurity: No Food Insecurity    Worried About Programme researcher, broadcasting/film/videounning Out of Food in the Last Year: Never true    Baristaan Out of Food in the Last Year: Never true   Transportation Needs: No Transportation Needs    Lack of Transportation (Medical): No    Lack of Transportation (Non-Medical): No   Physical Activity: Inactive    Days of Exercise per Week: 0 days    Minutes of Exercise per Session: 0 min   Stress: Stress Concern Present    Feeling of Stress : Very much   Intimate Partner Violence: Not At Risk    Fear of Current or Ex-Partner: No    Emotionally Abused: No    Physically Abused: No    Sexually Abused: No     Review of Systems: Pertinent items are noted in HPI.     List of Current Health Care Providers   Care Team     PCP     Name Type Specialty Phone Number    Bing PlumeShamblin,  K, DO Physician FAMILY MEDICINE [phone]          Care Team     No care team found                  Health Maintenance   Topic Date Due    Pap smear  Never done    Colonoscopy  Never done    Mammography  Never done    Annual Wellness Visit  Never done    Adult Tdap-Td (1 - Tdap) 02/27/2021 (Originally 01/08/1980)    Pneumococcal 19-64 Years Medium Risk (1 of 1 - PPSV23) 02/27/2021 (Originally 01/08/1980)    Influenza Vaccine (1) 03/12/2020    Hemoglobin A1C for  Diabetes  08/22/2020    Covid-19 Vaccine  Completed    Hepatitis C screening  Discontinued    HIV Screening  Discontinued    Shingles Vaccine  Discontinued     Medicare Wellness Assessment   Medicare initial  or wellness physical in the last year?: No  Advance Directives (optional)   Does patient have a living will or MPOA: no   Has patient provided [employer] with a copy?: no   Advance directive information given to the patient today?: no      Activities of Daily Living   Do you need help with dressing, bathing, or walking?: No   Do you need help with shopping, housekeeping, medications, or finances?: No   Do you have rugs in hallways, broken steps, or poor lighting?: No   Do you have grab bars in your bathroom, non-slip strips in your tub, and hand rails on your stairs?: No   Urinary Incontinence Screen (Women >=65 only)       Cognitive Function Screen (1=Yes, 0=No)   What is you age?: Correct   What is the time to the nearest hour?: Correct   What is the year?: Correct   What is the name of this clinic?: Correct   Can the patient recognize two persons (the doctor, the nurse, home help, etc.)?: Correct   What is the date of your birth? (day and month sufficient) : Correct   In what year did World War II end?: Incorrect   Who is the current president of the [place]?: Correct   Count from 20 down to 1?: Correct   What address did I give you earlier?: Correct   Total Score: 9   Interpretation of Total Score: Greater than 6 Normal   Hearing Screen   Have you noticed any hearing difficulties?: Yes  After whispering 9-1-6 how many numbers did the patient repeat correctly?: 3   Fall Risk Screen   Do you feel unsteady when standing or walking?: No  Do you worry about falling?: No  Have you fallen in the past year?: No   Vision Screen   Right Eye = 20: 50   Left Eye = 20: 50   Depression Screen   Little interest or pleasure in doing things.: Several Days  Feeling down, depressed, or hopeless: Several Days  PHQ  2 Total: 2        OBJECTIVE:   BP 120/70 (Site: Left, Patient Position: Sitting, Cuff Size: Adult)    Pulse 77    Temp 36.3 C (97.3 F) (Temporal)    Resp 19    Ht 1.626 m (5\' 4" )    Wt 101 kg (222 lb)    LMP  (LMP Unknown)    SpO2 92%    BMI 38.11 kg/m        Other appropriate exam:  General: Pleasant, WNWD, NAD, AAO, obesity  Head: Normocephalic, AT,no lesions.   Eyes: PERL, EOM's full, conjunctivae clear, fundi grossly normal.   Ears: EAC's mild bilateral cerumen, TM's pearly gray, No d/c or effusions.  Nose:  no rhinnorhea noted, no lesions  Throat: Clear, no exudates, no lesions MMM and pink.  Neck: Supple, no masses, no thyromegaly, no bruits.   Chest: Lungs clear, no rales, no rhonchi, no wheezes.   Heart: RRR, no murmurs, no rubs, no gallops.     Abdomen: Soft, no tenderness, no masses, BS normal.   Extremities: Normal gait, no deformities, no edema.    Neuro: Physiological, no localizing findings. .  Skin:  Normal PWD, no rashes, no lesions noted.   Psych: Mood and Affect normal    Health Maintenance Due   Topic Date Due    Pap smear  Never done    Colonoscopy  Never done    Mammography  Never done    Annual Wellness Visit  Never done      ASSESSMENT & PLAN:   1. Medicare annual wellness visit, subsequent    2. Encounter for screening mammogram for malignant neoplasm of breast    3. Colon cancer screening    4. Cervical cancer screening    5. Need for tetanus booster    6. 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 Gastroesophageal reflux disease without esophagitis    9. Lumbar pain    10. Tenderness of back       Identified Risk Factors/ Recommended Actions       ICD-10-CM    1. Medicare annual wellness visit, subsequent  Z00.00    2. Encounter for screening mammogram for malignant neoplasm of breast  Z12.31    3. Colon cancer screening  Z12.11    4. Cervical cancer screening  Z12.4    5. Need for tetanus booster  Z23    6. Oral candida  B37.0 Refer to [facility] PT   7. Mixed  stress and urge urinary incontinence  N39.46    8. Gastroesophageal reflux disease without esophagitis  K21.9    9. Lumbar pain  M54.5    10. Tenderness of back  M54.9        The PHQ 2 Total: 2 depression screen is interpreted as negative.    Orders Placed This Encounter    Refer to [facility] PT    nystatin (MYCOSTATIN) 100,000 unit/mL Oral Suspension    oxybutynin (DITROPAN) 5 mg Oral Tablet    esomeprazole magnesium (NEXIUM) 40 mg Oral Capsule, Delayed Release(E.C.)          The patient has been educated about risk factors and recommended preventive care. Written Prevention Plan completed/ updated and given to patient (see After Visit Summary).    Gyn apt 03/06/2020 call today and got this sooner appointment for patient.  For her Pap smear.  She quit smoking 6 yr ago.   Refer urology [facility] urine leakage   Patient reports she has had her mammogram and colonoscopy again will request these records.  Use oxybutynin as directed for urine leakage.  Use nystatin swish and spit for oral candidiasis.  Refer to physical therapy for back pain.  Request medical records from [facility].  Patient reports she needs a shoulder surgery right we have no documentation if warranted will refer her to Orthopedics.  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  She also reports seeing [facility] for this will request medical records and if warranted will refer her to a hand specialist.  Patient reports she has chronic neck pain and was seeing [facility] for this also will request medical records.  If warranted will send her to Ortho Neuro for evaluation.  Medicare wellness exam today 38 minutes  Return in about 13 weeks (around 05/29/2020) for DMII htn hld gerd .    Bing Plume, DO  FAMILY MEDICINE, [facility]  [address]  [place] [address]  Phone: [phone]  Fax: [phone]

## 2020-02-28 NOTE — Nursing Note (Signed)
Travel Screening     Question   Response    In the last month, have you been in contact with someone who was confirmed or suspected to have Coronavirus / COVID-19?  No / Unsure    Have you had a COVID-19 viral test in the last 14 days?  No    Do you have any of the following new or worsening symptoms?      Have you traveled internationally or domestically in the last month?  No      Travel History   Travel since 01/28/20     No documented travel since 01/28/20

## 2020-02-28 NOTE — Nursing Note (Signed)
02/28/20 1443   Physical Activity   On average, how many days per week do you engage in moderate to strenuous exercise (like a brisk walk)? 0 days   On average, how many minutes do you engage in exercise at this level? 0 min   Financial Resource Strain   How hard is it for you to pay for the very basics like food, housing, medical care, and heating? Somewhat   Housing Stability   In the last 12 months, was there a time when you were not able to pay the mortgage or rent on time? N   In the last 12 months, how many places have you lived? 1   In the last 12 months, was there a time when you did not have a steady place to sleep or slept in a shelter (including now)? N   Transportation Needs   In the past 12 months, has lack of transportation kept you from medical appointments or from getting medications? no   In the past 12 months, has lack of transportation kept you from meetings, work, or from getting things needed for daily living? No   Food Insecurity   Within the past 12 months, you worried that your food would run out before you got the money to buy more. Never true   Within the past 12 months, the food you bought just didnt last and you didnt have money to get more. Never true   Stress   Do you feel stress - tense, restless, nervous, or anxious, or unable to sleep at night because your mind is troubled all the time - these days? Very much   Intimate Partner Violence   Within the last year, have you been afraid of your partner or ex-partner? No   Within the last year, have you been humiliated or emotionally abused in other ways by your partner or ex-partner? No   Within the last year, have you been kicked, hit, slapped, or otherwise physically hurt by your partner or ex-partner? No   Within the last year, have you been raped or forced to have any kind of sexual activity by your partner or ex-partner? No   Alcohol Use   How often do you have a drink containing alcohol? Never   How often do you have six or more  drinks on one occasion? Never

## 2020-02-28 NOTE — Nursing Note (Signed)
02/28/20 1443   Medicare Wellness Assessment   Medicare initial or wellness physical in the last year? No   Advance Directives   Does patient have a living will or MPOA no   Has patient provided [employer] with a copy? no   Advance directive information given to the patient today? no   Activities of Daily Living   Do you need help with dressing, bathing, or walking? No   Do you need help with shopping, housekeeping, medications, or finances? No   Do you have rugs in hallways, broken steps, or poor lighting? No   Do you have grab bars in your bathroom, non-slip strips in your tub, and hand rails on your stairs? No   Cognitive Function Screen   What is you age? 1   What is the time to the nearest hour? 1   What is the year? 1   What is the name of this clinic? 1   Can the patient recognize two persons (the doctor, the nurse, home help, etc.)? 1   What is the date of your birth? (day and month sufficient)  1   In what year did World War II end? 0   Who is the current president of the [place]? 1   Count from 20 down to 1? 1   What address did I give you earlier? 1   Total Score 9   Interpretation of Total Score Greater than 6 Normal   Hearing Screen   Have you noticed any hearing difficulties? Yes   After whispering 9-1-6 how many numbers did the patient repeat correctly? 3   Fall Risk Assessment   Do you feel unsteady when standing or walking? No   Do you worry about falling? No   Have you fallen in the past year? No   Vision Screen   Right Eye = 20 50   Left Eye = 20 50

## 2020-02-28 NOTE — Nursing Note (Signed)
02/28/20 1400   Comprehensive Health Assessment-Adult   Do you wish to complete this form? No   During the past 4 weeks, how would you rate your health in general? Fair   During the past 4 weeks, how much difficulty have you had doing your usual activities inside and outside your home because of medical or emotional problems? Much difficulty   During the past 4 weeks, was someone available to help you if you needed and wanted help? No   In the past year, how many times have you gone to the emergency department or been admitted to a hospital for a health problem? None   Have you fallen 2 or more times in the last year? No   During the past 4 weeks, how much bodily pain have you typically had? Moderate pain   Do you have enough money to buy things you need in everyday life, such as food, clothing, medicines, and housing? Sometimes   Can you get to places beyond walking distance without help?  (For example, can you drive your own car or travel alone on buses)? Yes   Do you fasten your seatbelt when you are in a car? Yes, usually   Are you generally satisfied with your sleep? No   Over the last 2 weeks, how often have you been feeling nervous, anxious or on edge? 1   Over the last 2 weeks, how often have you been bothered by not being able to stop or control worrying? 1   Over the last 2 weeks, how often have you been bothered by little interest of pleasure in doing things? 1   Over the last 2 weeks, how often have you been bothered by feeling down, depressed, or hopeless? 1   During the past 12 months, have you experienced confusion or memory loss that is happening more often or is getting worse? Possibly, I am not sure   Do you smoke, or use tobacco? No   If you do not smoke, how often are you in the room with someone who is smoking? Never   Do you exercise 20 minutes 3 or more days per week (such as walking, dancing, biking, mowing grass, swimming)? No, I usually don't exercise this much   How often do you eat  food that is healthy (fruits, vegetables, lean meats) instead of unhealthy (sweets, fast food, junk food, fatty foods)? Almost never   During the past 4 weeks, how many drinks of wine, beer, or other alcohol-containing beverages did you have? No alcohol at all   How often do you have trouble taking medicines the eay you are told to take them? I always take them as prescribed   Do you currently use opioid pain medications, take regularly or keep on hand for use as needed (medication such as hydrocodone, oxycodone, codeine or morphine)? No   How many times in the past year have you used an illegal drug or used prescription medication for nonmedical reasons? None   Have your parents, brothers or sisters had any of the following problems before the age of 63 (check all that apply)? Heart problems or hardening of arteries;Diabetes (sugar);High Cholesterol   How confident are you that you can control or manage most of your health problems? Very confident   Do you need any help communicating with your doctors and nurses because of vision or hearing problems? No   Do you have one person you think of as your personal doctor (primary care provider or  family doctor)? Yes   If you answered yes to the question above, please list the name of your provider Dr Morene Antu   Are you now also seeing any specialist physician(s) (such as eye doctor, foot doctor, skin doctor)? Yes   If you are seeing a specialist for anything such as foot, eye, skin, etc.  please list their name(s) Dr whittington-eye, Dr Luetta Nutting- podiatry

## 2020-02-28 NOTE — Nursing Note (Signed)
02/28/20 1443   Fall Risk Assessment   Do you feel unsteady when standing or walking? No   Do you worry about falling? No   Have you fallen in the past year? No

## 2020-02-28 NOTE — Nursing Note (Signed)
02/28/20 1442   Depression Screen   Little interest or pleasure in doing things. 1   Feeling down, depressed, or hopeless 1   PHQ 2 Total 2

## 2020-02-28 NOTE — Patient Instructions (Signed)
Medicare Preventive Services  Medicare coverage information Recommendation for YOU   Heart Disease and Diabetes   Lipid profile Every 5 years or more often if at risk for cardiovascular disease  Last Lipid Panel  (Last result in the past 2 years)      Cholesterol   HDL   LDL   Direct LDL   Triglycerides        02/20/20 1452 162 42 90   150          Diabetes Screening  yearly for those at risk for diabetes, 2 tests per year for those with prediabetes Last Glucose: 112    Diabetes Self Management Training or Medical Nutrition Therapy  For those with diabetes, up to 10 hrs initial training within a year, subsequent years up to 2 hrs of follow up training Optional for those with diabetes     Medical Nutrition Therapy Three hours of one-on-one counseling in first year, two hours in subsequent years Optional for those with diabetes, kidney disease   Intensive Behavioral Therapy for Obesity  Face-to-face counseling, first month every week, month 2-6 every other week, month 7-12 every month if continued progress is documented Optional for those with Body Mass Index 30 or higher  Your Body mass index is 38.11 kg/m.   Tobacco Cessation (Quitting) Counseling   Two attempts per year, max 4 sessions per attempt, up to 8 per year, for those with tobacco-related health condition Optional for those that use tobacco   Cancer Screening   Colorectal screening   For anyone age 16 to 38 or any age if high risk:   Screening Colonoscopy every 10 yrs if low risk,  more frequent if higher risk  OR   Flexible  Sigmoidoscopy  every 5 yr OR   Fecal Occult Blood Testing yearly OR   Cologuard Stool DNA test once every 3 years OR   CT Colonography every 5 yrs    See your schedule below   Screening Pap Test Recommended every 3 years for all women age 45 to 20, or every five years if combined with HPV test (routine screening not needed after total hysterectomy).  Medicare covers every 2 years, up to yearly if high risk.  Screening Pelvic  Exam Medicare covers every 2 years, yearly if high risk or childbearing age with abnormal Pap in last 3 yrs. See your schedule below   Screening Mammogram   Recommended every 1-2 years for women age 20 to 35, and selectively recommended for women between 29-49 based on shared decisions about risk. Covered by Medicare up to every year for women age 6 or older See your schedule below   Lung Cancer Screening  Annual low dose computed tomography (LDCT scan) is recommended for those age 34-77 who smoked 30 pack-years and are current smokers or quit smoking within past 15 years (one pack-year= smoking one PPD for one year), after counseling by your doctor or nurse clinician about the possible benefits or harms See your schedule below   Vaccinations   Pneumococcal Vaccine Recommended routinely age 51+ with two separate vaccines one year apart (Prevnar then Pneumovax).  Recommended before age 12 if medical conditions increase risk  Seasonal Influenza Vaccine Once every flu season   Hepatitis B Vaccine 3 doses if risk (including anyone with diabetes or liver disease)  Shingles Vaccine Once or twice at age 41 or older  Diphtheria Tetanus Pertussis Vaccine ONCE as adult, booster every 10 years     Immunization History  Administered Date(s) Administered    Covid-19 Vaccine,Pfizer-BioNTech 09/22/2019, 10/13/2019     Shingles vaccine and Diphtheria Tetanus Pertussis vaccines are available at pharmacies or local health department without a prescription.   Other Screening   Bone Densitometry   Every 24 months for anyone at risk, including postmenopausal       Glaucoma Screening   Yearly if in high risk group such as diabetes, family history, African American age 59+ or Hispanic American age 75+      Hepatitis C Screening recommended ONCE for those born between 1945-1965, or high risk for HCV infection       HIV Testing recommended routinely at least ONCE, covered every year for age 28 to 54 regardless of risk, and every year for  age over 41 who ask for the test or higher risk  Yearly or up to 3 times in pregnancy         Abdominal Aortic Aneurysm Screening Ultrasound   Once between the age of 49-75 with a family history of AAA       Your Personalized Schedule for Preventive Tests   Health Maintenance: Pending and Last Completed       Date Due Completion Date    Adult Tdap-Td (1 - Tdap) Never done ---    Pneumococcal 19-64 Years Medium Risk (1 of 1 - PPSV23) Never done ---    Pap smear Never done ---    Colonoscopy Never done ---    Mammography Never done ---    Annual Wellness Visit Never done ---    Influenza Vaccine (1) 03/12/2020 ---    Hemoglobin A1C for Diabetes 08/22/2020 02/20/2020

## 2020-03-01 ENCOUNTER — Encounter (INDEPENDENT_AMBULATORY_CARE_PROVIDER_SITE_OTHER): Payer: Self-pay | Admitting: Foot & Ankle Surgery
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
## 2020-03-01 NOTE — Procedures (Signed)
Nails 1-5 b/l mechanically debrided in length and thickness with nail nippers and a rotary burr without incident. Patient tolerated the procedure well.

## 2020-03-06 ENCOUNTER — Ambulatory Visit (INDEPENDENT_AMBULATORY_CARE_PROVIDER_SITE_OTHER): Payer: Self-pay | Admitting: Obstetrics & Gynecology

## 2020-03-13 ENCOUNTER — Other Ambulatory Visit (INDEPENDENT_AMBULATORY_CARE_PROVIDER_SITE_OTHER): Payer: Self-pay | Admitting: FAMILY MEDICINE

## 2020-03-13 ENCOUNTER — Telehealth (INDEPENDENT_AMBULATORY_CARE_PROVIDER_SITE_OTHER): Payer: Self-pay | Admitting: FAMILY MEDICINE

## 2020-03-13 DIAGNOSIS — F321 Major depressive disorder, single episode, moderate: Secondary | ICD-10-CM

## 2020-03-13 DIAGNOSIS — N3946 Mixed incontinence: Secondary | ICD-10-CM

## 2020-03-13 DIAGNOSIS — L219 Seborrheic dermatitis, unspecified: Secondary | ICD-10-CM

## 2020-03-13 MED ORDER — OXYBUTYNIN CHLORIDE 5 MG TABLET
5.0000 mg | ORAL_TABLET | Freq: Two times a day (BID) | ORAL | 5 refills | Status: DC
Start: 2020-03-13 — End: 2020-05-30

## 2020-03-13 MED ORDER — KETOCONAZOLE 2 % SHAMPOO
MEDICATED_SHAMPOO | CUTANEOUS | 1 refills | Status: DC
Start: 2020-03-13 — End: 2021-05-18

## 2020-03-13 MED ORDER — SERTRALINE 100 MG TABLET
100.0000 mg | ORAL_TABLET | Freq: Two times a day (BID) | ORAL | 1 refills | Status: DC
Start: 2020-03-13 — End: 2020-03-25

## 2020-03-13 NOTE — Telephone Encounter (Signed)
Pt needs a new script over to [facility] for Sertraline for 2 times a day instead of one, 100 mg for 90 days. Pt also needs a new script sent for Oxybutynin 5 mg for 90 days instead of a month. Pt is also wondering if you can get the Nizoral shampoo for 3 for 3 months. Pt is also wondering if you have figured out about a urologist that takes her insurance.

## 2020-03-19 ENCOUNTER — Telehealth (INDEPENDENT_AMBULATORY_CARE_PROVIDER_SITE_OTHER): Payer: Self-pay | Admitting: FAMILY MEDICINE

## 2020-03-19 NOTE — Telephone Encounter (Signed)
Please call patient and see who she would like to see you for her right shoulder issues I have her medical records from [place].  I need to know what orthopedic surgeon she would like to see.  There is Dr. Manson Passey here then she can go to [facility] and or another location if she so chooses.

## 2020-03-19 NOTE — Telephone Encounter (Signed)
Pt called and is asking about the meds requested in previous message. Also some questions about the urologist you referred her too.

## 2020-03-19 NOTE — Telephone Encounter (Signed)
Patient had several refills sent in recently has she called the pharmacy 1. To what medicines is she asking about today and 3 what issue want to know about the urologist I just refer her to were a urologist is available.  I do not personally know the urologist.

## 2020-03-20 ENCOUNTER — Other Ambulatory Visit (INDEPENDENT_AMBULATORY_CARE_PROVIDER_SITE_OTHER): Payer: Self-pay | Admitting: FAMILY MEDICINE

## 2020-03-20 DIAGNOSIS — N3946 Mixed incontinence: Secondary | ICD-10-CM

## 2020-03-20 NOTE — Telephone Encounter (Signed)
I sent her a new referral to a urologist in [place] they will call her to set up an appointment.  The medications in question are not due to be filled until later that is why she can not get the medicine.  It is an Paediatric nurse.

## 2020-03-25 ENCOUNTER — Other Ambulatory Visit (INDEPENDENT_AMBULATORY_CARE_PROVIDER_SITE_OTHER): Payer: Self-pay | Admitting: FAMILY MEDICINE

## 2020-03-25 ENCOUNTER — Telehealth (INDEPENDENT_AMBULATORY_CARE_PROVIDER_SITE_OTHER): Payer: Self-pay | Admitting: FAMILY MEDICINE

## 2020-03-25 DIAGNOSIS — N3946 Mixed incontinence: Secondary | ICD-10-CM

## 2020-03-25 DIAGNOSIS — F321 Major depressive disorder, single episode, moderate: Secondary | ICD-10-CM

## 2020-03-25 MED ORDER — SERTRALINE 100 MG TABLET
100.0000 mg | ORAL_TABLET | Freq: Every day | ORAL | 1 refills | Status: DC
Start: 2020-03-25 — End: 2020-10-14

## 2020-03-25 NOTE — Telephone Encounter (Signed)
Pt called and said that instead of her script being two 100 mg tablets per day, she wants it to be one 100 mg tablet per day.

## 2020-03-27 ENCOUNTER — Telehealth (INDEPENDENT_AMBULATORY_CARE_PROVIDER_SITE_OTHER): Payer: Self-pay | Admitting: FAMILY MEDICINE

## 2020-03-27 NOTE — Telephone Encounter (Signed)
Do you know anything regarding this referral being cancelled in [facility] and changed to [place] location.     Thanks  Rodney Booze, LPN  3/90/3009, 18:29

## 2020-03-27 NOTE — Telephone Encounter (Signed)
Pt is wanting to talk to someone about her referral to urologist.

## 2020-03-28 ENCOUNTER — Telehealth (INDEPENDENT_AMBULATORY_CARE_PROVIDER_SITE_OTHER): Payer: Self-pay | Admitting: FAMILY MEDICINE

## 2020-03-28 NOTE — Telephone Encounter (Signed)
She called and said that [facility] was too far to drive she was then offered [address] or [place] or [place] she chose [place] for referral to Urology.

## 2020-03-28 NOTE — Telephone Encounter (Signed)
I re faxed referral to [facility] (scanned in pts chart). I also left a VM for pt to inform her referral has been sent to [facility].    Thanks  Rodney Booze, LPN  8/33/3832, 17:23

## 2020-04-02 ENCOUNTER — Other Ambulatory Visit: Payer: Self-pay

## 2020-04-02 ENCOUNTER — Ambulatory Visit: Payer: Medicare PPO | Attending: Obstetrics & Gynecology | Admitting: Obstetrics & Gynecology

## 2020-04-02 ENCOUNTER — Encounter (INDEPENDENT_AMBULATORY_CARE_PROVIDER_SITE_OTHER): Payer: Self-pay | Admitting: Obstetrics & Gynecology

## 2020-04-02 ENCOUNTER — Ambulatory Visit (HOSPITAL_COMMUNITY): Payer: Medicare PPO

## 2020-04-02 VITALS — BP 114/66 | HR 81 | Temp 98.1°F | Resp 19 | Ht 64.0 in | Wt 219.2 lb

## 2020-04-02 DIAGNOSIS — Z124 Encounter for screening for malignant neoplasm of cervix: Secondary | ICD-10-CM

## 2020-04-02 DIAGNOSIS — Z01419 Encounter for gynecological examination (general) (routine) without abnormal findings: Secondary | ICD-10-CM | POA: Insufficient documentation

## 2020-04-02 DIAGNOSIS — Z1239 Encounter for other screening for malignant neoplasm of breast: Secondary | ICD-10-CM | POA: Insufficient documentation

## 2020-04-02 NOTE — Nursing Note (Signed)
Body mass index is 37.63 kg/m.     Patient presents for annual pap and breast exam she states it has been awhile and she recently lost her spouse making her take better care of herself.    Last Pap _ Dr. Logan Bores in [place]  Have you ever had an abnormal pap smear: Yes     Travel Screening     Question   Response    In the last month, have you been in contact with someone who was confirmed or suspected to have Coronavirus / COVID-19?  No / Unsure    Have you had a COVID-19 viral test in the last 14 days?  No    Do you have any of the following new or worsening symptoms?  None of these    Have you traveled internationally or domestically in the last month?  No      Travel History   Travel since 03/02/20     No documented travel since 03/02/20          PHQ Questionnaire  Little interest or pleasure in doing things.: Not at all  Feeling down, depressed, or hopeless: Not at all  PHQ 2 Total: 0      Fall Risk Assessment  Do you feel unsteady when standing or walking?: No  Do you worry about falling?: No  Have you fallen in the past year?: No

## 2020-04-02 NOTE — Progress Notes (Signed)
[facility]  OB[facility]N, [facility]  [address]  [place] [address]  [phone]    Angela Frey 04/02/2020   [id number] [birth year]     Chief Complaint: New Patient and Annual Pap      Subjective: Angela Frey is a 59 y.o. female presenting for WWE. C/o occasional pelvic pain and nocturia.     Medical History   I have reviewed and updated as appropriate the past medical, family and social history today:    Medical History/Surgical History/Family History  Past Medical History:   Diagnosis Date    Asthma     Diabetes (CMS HCC)     Hypertension     Hypothyroid           Past Surgical History:   Procedure Laterality Date    HX TUBAL LIGATION      LEG SURGERY            Family Medical History:    None            Menstrual History    Total time span for birth control pills      Use of HRT (how long total use)      Use of Infertility Treatments      Age of Menarche      Avg. frequency (days)      Duration (days)      Flow      Pattern      Age of Menopause         OB History   No obstetric history on file.     Social History     Socioeconomic History    Marital status: Unknown     Spouse name: Not on file    Number of children: Not on file    Years of education: Not on file    Highest education level: Not on file   Tobacco Use    Smoking status: Former Smoker    Smokeless tobacco: Never Used     Social Determinants of Psychologist, prison and probation services Strain: Medium Risk    Difficulty of Paying Living Expenses: Somewhat hard   Food Insecurity: No Food Insecurity    Worried About Programme researcher, broadcasting/film/video in the Last Year: Never true    Barista in the Last Year: Never true   Transportation Needs: No Transportation Needs    Lack of Transportation (Medical): No    Lack of Transportation (Non-Medical): No   Physical Activity: Inactive    Days of Exercise per Week: 0 days    Minutes of Exercise per Session: 0 min   Stress: Stress Concern Present    Feeling of Stress :  Very much   Intimate Partner Violence: Not At Risk    Fear of Current or Ex-Partner: No    Emotionally Abused: No    Physically Abused: No    Sexually Abused: No     Expanded Substance History       Allergies:  Allergies   Allergen Reactions    Cleocin [Clindamycin] Hives/ Urticaria    Nyquil [Doxylamin-Pse-Dm-Acetaminophen] Hives/ Urticaria    Tessalon [Benzonatate] Hives/ Urticaria     Medications:  Current Outpatient Medications   Medication Sig    albuterol sulfate (PROVENTIL OR VENTOLIN OR PROAIR) 90 mcg/actuation Inhalation HFA Aerosol Inhaler Take 2 Puffs by inhalation Every 6 hours as needed    albuterol sulfate (PROVENTIL) 2.5 mg /3 mL (0.083 %) Inhalation Solution for  Nebulization 2.5 mg by Nebulization route Every 4 hours as needed for Wheezing    amitriptyline (ELAVIL) 25 mg Oral Tablet Take 25 mg by mouth Every night    aspirin 81 mg Oral Tablet, Chewable Take 81 mg by mouth Once a day    aspirin/salicylamide/caffeine (BC HEADACHE POWDER ORAL) Take by mouth Once per day as needed    baclofen (LIORESAL) 10 mg Oral Tablet Take 1 Tablet (10 mg total) by mouth Four times a day    Blood Sugar Diagnostic (ACCU-CHEK GUIDE TEST STRIPS) Strip 1 Strip by Does not apply route Four times a day    Blood-Glucose Meter (ACCU-CHEK GUIDE ME GLUCOSE MTR) Misc by Does not apply route 4 times per day    budesonide-formoteroL (SYMBICORT) 160-4.5 mcg/actuation Inhalation HFA Aerosol Inhaler Take 2 Puffs by inhalation Twice daily    calcium carbonate/vitamin D3 (CALCIUM 600 + D ORAL) Take by mouth Once a day    chlorhexidine gluconate (PERIDEX) 0.12 % Mucous Membrane Mouthwash 15 mL swish and spit Twice daily    esomeprazole magnesium (NEXIUM) 40 mg Oral Capsule, Delayed Release(E.C.) Take 1 Capsule (40 mg total) by mouth Every morning before breakfast    fluorouraciL (EFUDEX) 5 % Cream Apply topically Twice daily Apply to lesions as directed    furosemide (LASIX) 20 mg Oral Tablet Take 20 mg by mouth  Once a day    gabapentin (NEURONTIN) 400 mg Oral Capsule Take 1 Capsule (400 mg total) by mouth Three times a day    hydroCHLOROthiazide (HYDRODIURIL) 25 mg Oral Tablet Take 1 Tablet (25 mg total) by mouth Once a day    ketoconazole (NIZORAL) 2 % Shampoo 3x per week 1/2 to 1 oz per shampoo    Lactobac no.41/Bifidobact no.7 (PROBIOTIC-10 ORAL) Take by mouth Once a day    Lancets (ACCU-CHEK SOFTCLIX LANCETS) Misc 1 Each by Does not apply route Four times a day    levothyroxine (SYNTHROID) 175 mcg Oral Tablet Take 175 mcg by mouth Every morning    lisinopriL (PRINIVIL) 20 mg Oral Tablet Take 1 Tablet (20 mg total) by mouth Twice daily    Magnesium 250 mg Oral Tablet Take by mouth Once a day    meloxicam (MOBIC) 15 mg Oral Tablet Take 15 mg by mouth Once per day as needed for Pain    metFORMIN (GLUCOPHAGE) 500 mg Oral Tablet Take 1 Tablet (500 mg total) by mouth Twice daily with food    Milk Thistle 500 mg Oral Capsule Take by mouth Twice daily    nystatin (MYCOSTATIN) 100,000 unit/gram Cream Apply 0.5 g t.i.d. to affected area.    nystatin (NYSTOP) 100,000 unit/gram Powder by Apply Topically route Three times a day as needed    oxybutynin (DITROPAN) 5 mg Oral Tablet Take 1 Tablet (5 mg total) by mouth Twice daily    pediatric multivitamins Oral Tablet, Chewable Take 1 Tablet by mouth Once a day    pravastatin (PRAVACHOL) 10 mg Oral Tablet Take 1 Tablet (10 mg total) by mouth Every evening    sertraline (ZOLOFT) 100 mg Oral Tablet Take 1 Tablet (100 mg total) by mouth Once a day    terbinafine HCL (LAMISIL AT) 1 % Cream Apply topically Twice daily Apply to nails twice daily with a q tip. Apply to bottoms of feet and toes twice daily for 2-4 weeks or until resolution    tiotropium bromide (SPIRIVA HANDIHALER) 18 mcg Inhalation Capsule, w/Inhalation Device Take 18 mcg by inhalation Once a day  turmeric/turmeric ext/pepr ext (TURMERIC-TURMERIC EXT-PEPPER) 500-3 mg Oral Capsule Take by mouth Once a day     Urea 40 % 40 % Cream by Apply Topically route Twice daily Apply a pea sized amount with a q-tip to nails twice daily     Immunization History:  Immunization History   Administered Date(s) Administered    Covid-19 Vaccine,Pfizer-BioNTech 09/22/2019, 10/13/2019     Problem List:  Patient Active Problem List    Diagnosis Date Noted    Mixed stress and urge urinary incontinence 02/28/2020    Essential hypertension 02/20/2020    Hypothyroidism due to Hashimoto's thyroiditis 02/20/2020    Morbid obesity (CMS HCC) 02/20/2020    Uncontrolled type 2 diabetes mellitus with hyperglycemia (CMS HCC) 02/20/2020    Mild intermittent asthma without complication 02/20/2020    Simple chronic bronchitis (CMS HCC) 02/20/2020    Moderate major depression (CMS HCC) 02/20/2020    Gastroesophageal reflux disease without esophagitis 02/20/2020    Seborrheic dermatitis of scalp 02/20/2020    Mixed hyperlipidemia 02/20/2020    Primary insomnia 02/20/2020       Review of Systems:  Patient was asked about all of the following:  Constitutional: Weakness, fatigue, fever, chills, weight loss, weight gain, daytime sleeping.  Eyes: Glasses, blurred vision, double vision, burning, cataracts, vision changes, problems with bright lights, itching.  Ears, nose, mouth, throat, and face: Earache or drainage, hearing loss, hoarseness, nose bleeds, sinus problems, sore throat, ringing in the ears, nasal congestion.  Respiratory:  Shortness of breath, cough, coughing up blood, recent lung infection, snoring, wheezing, home breathing treatment.  Cardiovascular:  Chest pain, swelling legs, heart trouble, palpitations, leg cramps, SOB with exertion, murmur.  Breast: Not specifically reviewed  Gastrointestinal: Abdominal pain, black stool, constipation, diarrhea, heartburn, nausea, vomiting, swallowing problems.  Genitourinary:  Blood in urine, painful urination, increased frequency, leaking urine, recurrent UTI's, urination at night, painful  periods, straining.  Integument/breast:  Rash, itching, color change of lesion, change in size of lesion, bleeding ulcer/lesion, new lesion, delayed healing.  Hematologic/lymphatic: Anemia, blood clots, easy bruising, hepatitis exposure, transfusions, swollen lymph nodes, bleeding problems.  Musculoskeletal: Back pain, joint pain, difficulty walking, cramps, trauma, weakness, joint swelling.  Neurological: Dizziness, headache, numbness, paralysis, seizures, stroke, memory loss.  Behavioral/Psych: Suicidal ideations, homicidal ideations, anxiety, depression, confusion, sleeping problems, stress, panic attacks, hallucinations.  Endocrine: Dry skin, excess thirst, hair loss, heat intolerance, cold intolerance, excess hair growth.    ROS neg except as mentioned in CC.    Objective   Vitals:  BP 114/66    Pulse 81    Temp 36.7 C (98.1 F)    Resp 19    Ht 1.626 m (5\' 4" )    Wt 99.4 kg (219 lb 3.2 oz)    LMP  (LMP Unknown) Comment: stopped having periods at aget 44-46   SpO2 93%    Breastfeeding No    BMI 37.63 kg/m       Height: 162.6 cm (5\' 4" )  Wt Readings from Last 5 Encounters:   04/02/20 99.4 kg (219 lb 3.2 oz)   02/28/20 101 kg (222 lb)   02/26/20 102 kg (225 lb)   02/20/20 102 kg (225 lb)   09/10/19 99.8 kg (220 lb)     Body mass index is 37.63 kg/m.    Physical Exam:  General Appearance: appears in good health, morbidly obese, appears stated age, no distress and vital signs reviewed  Head: NCAT  Eyes: lids and lashes normal, conjunctivae and sclerae  normal and pupils equal, EOM intact  ENT: TM's clear, nose without erythema, oropharynx without erythema, exudate, or blistering  Neck: No lymphadenopathy. No thyromegaly or thyroid mass.  Cardiovascular: Regular rate and rhythm with no murmurs, gallops or rubs.  Pulmonary: Lungs clear to auscultation in all fields bilaterally. Good air movement.  Abdomen: Soft, corpuscular, non-tender, non-distended, no massses or organomegaly  Extremities: No clubbing, cyanosis,  or edema. and  No joint effusion.  Skin: Skin warm and dry and No rashes  Musculoskeletal: inspection is normal  Lymphatics: No lymphadenopathy of cervical, axillary or inguinal lymph nodes  Psychiatric: Normal affect, behavior, memory, thought content, judgement, and speech.  Neurology: normal without focal findings  mental status, speech normal, alert and oriented x iii    Breasts: Bilateral Inspection negative. Symmetrical. No nipple discharge or bleeding. No masses or nodularity palpable, no palpable axillary lymphadenopathy, No fibrous tissue.    Pelvic: Vulva without lesions, pale, atrophic, NT   Urethra without lesions, NT   Bladder NT  Vaginal pale, dry, atrophic, no lesions, NT   Cervix smooth, no lesions, no d/c, NT, pink   Uterus normal size, mobile, nontender   Adenexa normal size, NT, no masses  Anus without lesions, good tone  Rectal: No hemorhoids or fissures. No internal hemorrhoids or masses.           Laboratory Studies/Data Reviewed   I have reviewed all available and pertinent laboratory studies, imaging and health maintenance  Health Maintenance   Topic Date Due    Pap smear  Never done    Colonoscopy  Never done    Mammography  Never done    Annual Wellness Visit  Never done    Influenza Vaccine (1) Never done    Adult Tdap-Td (1 - Tdap) 02/27/2021 (Originally 01/08/1980)    Pneumococcal 19-64 Years Medium Risk (1 of 1 - PPSV23) 02/27/2021 (Originally 01/08/1980)    Hemoglobin A1C for Diabetes  08/22/2020    Covid-19 Vaccine  Completed    Hepatitis C screening  Discontinued    HIV Screening  Discontinued    Shingles Vaccine  Discontinued     Nursing Notes:   Fransisco Hertzarsons, Ashley N, [facility]MA  04/02/20 1425  Signed  Body mass index is 37.63 kg/m.     Patient presents for annual pap and breast exam she states it has been awhile and she recently lost her spouse making her take better care of herself.    [place] _ [place]  Have you ever had an abnormal pap smear: Yes     Travel  Screening     Question   Response    In the last month, have you been in contact with someone who was confirmed or suspected to have Coronavirus / COVID-19?  No / Unsure    Have you had a COVID-19 viral test in the last 14 days?  No    Do you have any of the following new or worsening symptoms?  None of these    Have you traveled internationally or domestically in the last month?  No      Travel History   Travel since 03/02/20     No documented travel since 03/02/20          PHQ Questionnaire  Little interest or pleasure in doing things.: Not at all  Feeling down, depressed, or hopeless: Not at all  PHQ 2 Total: 0      Fall Risk Assessment  Do you  feel unsteady when standing or walking?: No  Do you worry about falling?: No  Have you fallen in the past year?: No       I have reviewed all health maintenance items with the patient.  Assessment/Plan   Diagnosis    ICD-10-CM    1. Encounter for screening for malignant neoplasm of breast, unspecified screening modality  Z12.39 MAMMO BILATERAL SCREENING-ADDL VIEWS/BREAST US AS REQ BY RAD   2. Cervical cancer screening  Z12.4 CYTOPATHOLOGY, GYN +/- HIGH RISK HPV     Refer to [facility]       Encounter Medications and Orders  Orders Placed This Encounter    MAMMO BILATERAL SCREENING-ADDL VIEWS/BREAST US AS REQ BY RAD    CYTOPATHOLOGY, GYN +/- HIGH RISK HPV         Plan  Pap performed  mammogram ordered  Recommend healthy diet, reg exercise, monthly self breast exams  Recommend weight loss to improve LOU  UA with reflex culture        Return for prn and annual.    Seward Meth, DO

## 2020-04-04 ENCOUNTER — Telehealth (INDEPENDENT_AMBULATORY_CARE_PROVIDER_SITE_OTHER): Payer: Self-pay | Admitting: FAMILY MEDICINE

## 2020-04-04 NOTE — Telephone Encounter (Signed)
Pt scheduled for appt with [facility] on 05/12/2020 @ 1:00pm. Pt informed of appt date and time.    Thanks  Rodney Booze, LPN  6/83/7290, 11:57

## 2020-04-08 ENCOUNTER — Encounter (INDEPENDENT_AMBULATORY_CARE_PROVIDER_SITE_OTHER): Payer: Self-pay | Admitting: Obstetrics & Gynecology

## 2020-04-08 ENCOUNTER — Ambulatory Visit (INDEPENDENT_AMBULATORY_CARE_PROVIDER_SITE_OTHER): Payer: Self-pay | Admitting: FAMILY MEDICINE

## 2020-04-09 LAB — QUEST MISC ORDER - AMBIENT

## 2020-04-10 ENCOUNTER — Telehealth (HOSPITAL_COMMUNITY): Payer: Self-pay | Admitting: Obstetrics & Gynecology

## 2020-04-10 NOTE — Telephone Encounter (Signed)
Notify pt pap did not reveal any evidence of malignancy. mk

## 2020-04-10 NOTE — Telephone Encounter (Signed)
Notified patient and verbalized understanding.

## 2020-05-12 ENCOUNTER — Ambulatory Visit (HOSPITAL_COMMUNITY): Payer: Self-pay

## 2020-05-29 ENCOUNTER — Encounter (INDEPENDENT_AMBULATORY_CARE_PROVIDER_SITE_OTHER): Payer: Self-pay | Admitting: FAMILY MEDICINE

## 2020-05-30 ENCOUNTER — Other Ambulatory Visit: Payer: Self-pay

## 2020-05-30 ENCOUNTER — Other Ambulatory Visit (INDEPENDENT_AMBULATORY_CARE_PROVIDER_SITE_OTHER): Payer: Self-pay | Admitting: FAMILY MEDICINE

## 2020-05-30 ENCOUNTER — Ambulatory Visit: Payer: Medicare PPO | Attending: FAMILY MEDICINE | Admitting: FAMILY MEDICINE

## 2020-05-30 ENCOUNTER — Telehealth (INDEPENDENT_AMBULATORY_CARE_PROVIDER_SITE_OTHER): Payer: Self-pay | Admitting: FAMILY MEDICINE

## 2020-05-30 ENCOUNTER — Encounter (INDEPENDENT_AMBULATORY_CARE_PROVIDER_SITE_OTHER): Payer: Self-pay | Admitting: FAMILY MEDICINE

## 2020-05-30 VITALS — BP 123/69 | HR 81 | Temp 97.1°F | Resp 19 | Ht 64.0 in | Wt 219.0 lb

## 2020-05-30 DIAGNOSIS — J4520 Mild intermittent asthma, uncomplicated: Secondary | ICD-10-CM | POA: Insufficient documentation

## 2020-05-30 DIAGNOSIS — N3946 Mixed incontinence: Secondary | ICD-10-CM

## 2020-05-30 DIAGNOSIS — Z7984 Long term (current) use of oral hypoglycemic drugs: Secondary | ICD-10-CM | POA: Insufficient documentation

## 2020-05-30 DIAGNOSIS — L219 Seborrheic dermatitis, unspecified: Secondary | ICD-10-CM | POA: Insufficient documentation

## 2020-05-30 DIAGNOSIS — Z87891 Personal history of nicotine dependence: Secondary | ICD-10-CM | POA: Insufficient documentation

## 2020-05-30 DIAGNOSIS — K219 Gastro-esophageal reflux disease without esophagitis: Secondary | ICD-10-CM | POA: Insufficient documentation

## 2020-05-30 DIAGNOSIS — Z7982 Long term (current) use of aspirin: Secondary | ICD-10-CM | POA: Insufficient documentation

## 2020-05-30 DIAGNOSIS — J410 Simple chronic bronchitis: Secondary | ICD-10-CM | POA: Insufficient documentation

## 2020-05-30 DIAGNOSIS — Z79899 Other long term (current) drug therapy: Secondary | ICD-10-CM | POA: Insufficient documentation

## 2020-05-30 DIAGNOSIS — E782 Mixed hyperlipidemia: Secondary | ICD-10-CM | POA: Insufficient documentation

## 2020-05-30 DIAGNOSIS — L989 Disorder of the skin and subcutaneous tissue, unspecified: Secondary | ICD-10-CM

## 2020-05-30 DIAGNOSIS — E1165 Type 2 diabetes mellitus with hyperglycemia: Secondary | ICD-10-CM | POA: Insufficient documentation

## 2020-05-30 DIAGNOSIS — R0602 Shortness of breath: Secondary | ICD-10-CM

## 2020-05-30 DIAGNOSIS — M545 Low back pain, unspecified: Secondary | ICD-10-CM

## 2020-05-30 DIAGNOSIS — K121 Other forms of stomatitis: Secondary | ICD-10-CM

## 2020-05-30 DIAGNOSIS — E063 Autoimmune thyroiditis: Secondary | ICD-10-CM

## 2020-05-30 DIAGNOSIS — I10 Essential (primary) hypertension: Secondary | ICD-10-CM | POA: Insufficient documentation

## 2020-05-30 DIAGNOSIS — E038 Other specified hypothyroidism: Secondary | ICD-10-CM

## 2020-05-30 DIAGNOSIS — Z6837 Body mass index (BMI) 37.0-37.9, adult: Secondary | ICD-10-CM | POA: Insufficient documentation

## 2020-05-30 MED ORDER — HYDROCORTISONE 0.5 % TOPICAL CREAM
TOPICAL_CREAM | Freq: Two times a day (BID) | CUTANEOUS | 0 refills | Status: AC | PRN
Start: 2020-05-30 — End: 2020-06-13

## 2020-05-30 MED ORDER — MELOXICAM 15 MG TABLET
15.0000 mg | ORAL_TABLET | Freq: Every day | ORAL | 1 refills | Status: DC | PRN
Start: 2020-05-30 — End: 2021-01-27

## 2020-05-30 MED ORDER — OXYBUTYNIN CHLORIDE 5 MG TABLET
5.0000 mg | ORAL_TABLET | Freq: Two times a day (BID) | ORAL | 1 refills | Status: DC
Start: 2020-05-30 — End: 2020-07-09

## 2020-05-30 MED ORDER — LEVOTHYROXINE 175 MCG TABLET
175.0000 ug | ORAL_TABLET | Freq: Every morning | ORAL | 1 refills | Status: DC
Start: 2020-05-30 — End: 2020-12-03

## 2020-05-30 NOTE — Nursing Note (Signed)
Depression screening is positive. Follow up plan of care: Suicide risk assessment completed and patient  is not suicidal, but has thoughts she would be better off.    Rodney Booze, LPN  38/88/2800, 10:42

## 2020-05-30 NOTE — Nursing Note (Signed)
05/30/20 0944   Fall Risk Assessment   Do you feel unsteady when standing or walking? No   Do you worry about falling? No   Have you fallen in the past year? Yes   How many times have you fallen? Once   Were you ever injured from falling? No   Timed up and go test (in seconds) 9

## 2020-05-30 NOTE — Nursing Note (Signed)
05/30/20 0943   Depression Screen   Little interest or pleasure in doing things. 1   Feeling down, depressed, or hopeless 1   PHQ 2 Total 2

## 2020-05-30 NOTE — Nursing Note (Signed)
Travel Screening     Question   Response    In the last month, have you been in contact with someone who was confirmed or suspected to have Coronavirus / COVID-19?  No / Unsure    Have you had a COVID-19 viral test in the last 14 days?  No    Do you have any of the following new or worsening symptoms?  None of these    Have you traveled internationally or domestically in the last month?  No      Travel History   Travel since 04/29/20     No documented travel since 04/29/20

## 2020-05-30 NOTE — Telephone Encounter (Signed)
oxybutynin (DITROPAN) 5 mg Oral Tablet  meloxicam (MOBIC) 15 mg Oral Tablet  levothyroxine (SYNTHROID) 175 mcg Oral Tablet  Pt requested these to be refilled.

## 2020-05-30 NOTE — Progress Notes (Signed)
PRIMARY CARE, MEDICAL OFFICE BUILDING 5  [address]  [place] [address]      Operated by [facility]     Name: Angela Frey MRN:  [id number]   Date: 05/30/2020 Age: 59 y.o.      Chief Complaint   Patient presents with   . Follow-up     13 week f/u for DMII, htn, hld, gerd   . Dry Skin     Pt requesting steroid cream for scaly dry skin on face       SUBJECTIVE    Patient is 59 y.o. she  presents today for follow-up on diabetes, hypertension, hyperlipidemia and GERD.  BS at home she says is fine around 120. She reports she is taking all her medication as directed and has been trying to eat better.  Place on her face left temple.  And she has dry scaly skin her eyebrows and sometimes on her head.  Complain of sore lips and recurrent thrush.  She has tried Abreva and steroids and mouthwash nothing helped with that very well.  Advise directed refer her to Dermatology or allergy clinic or she can go to the dentist for further evaluation.  Patient reports her blood pressure has been doing well at home.  She has an appointment with the urologist coming up soon.  Her stomach issues have been doing fairly well.  Her sisters now traveling nurse and she wants to talk about that her sister is making a lot more money being a traveling Engineer, civil (consulting).  She is going to make a quilt she was to shin with Quilt she is going to make.  It is about Janeece Agee women.  She wants me to see the video the [place] she video outside of her house.    ROS:      ROS - pertinent for presenting problem.  Systems otherwise negative than what has been noted.    PAST MEDICAL HISTORY  I have reviewed and updated as appropriate the past medical, surgical, family, and social history today:    Medical History/Surgical History/Family History/Social History  Past Medical History:   Diagnosis Date   . Asthma    . Diabetes (CMS HCC)    . Hypertension    . Hypothyroid          Past Surgical History:   Procedure Laterality Date    . HX TUBAL LIGATION     . LEG SURGERY       Family Medical History:    None          Social History     Socioeconomic History   . Marital status: Unknown     Spouse name: Not on file   . Number of children: Not on file   . Years of education: Not on file   . Highest education level: Not on file   Tobacco Use   . Smoking status: Former Games developer   . Smokeless tobacco: Never Used     Social Determinants of Health     Financial Resource Strain: Medium Risk   . Difficulty of Paying Living Expenses: Somewhat hard   Food Insecurity: No Food Insecurity   . Worried About Programme researcher, broadcasting/film/video in the Last Year: Never true   . Ran Out of Food in the Last Year: Never true   Transportation Needs: No Transportation Needs   . Lack of Transportation (Medical): No   . Lack of Transportation (Non-Medical): No   Physical Activity: Inactive   .  Days of Exercise per Week: 0 days   . Minutes of Exercise per Session: 0 min   Stress: Stress Concern Present   . Feeling of Stress : Very much   Intimate Partner Violence: Not At Risk   . Fear of Current or Ex-Partner: No   . Emotionally Abused: No   . Physically Abused: No   . Sexually Abused: No       Allergies:  Allergies   Allergen Reactions   . Cleocin [Clindamycin] Hives/ Urticaria   . Nyquil [Doxylamin-Pse-Dm-Acetaminophen] Hives/ Urticaria   . Tessalon [Benzonatate] Hives/ Urticaria       Medications:  Current Outpatient Medications   Medication Sig   . albuterol sulfate (PROVENTIL OR VENTOLIN OR PROAIR) 90 mcg/actuation Inhalation HFA Aerosol Inhaler Take 2 Puffs by inhalation Every 6 hours as needed   . albuterol sulfate (PROVENTIL) 2.5 mg /3 mL (0.083 %) Inhalation Solution for Nebulization 2.5 mg by Nebulization route Every 4 hours as needed for Wheezing   . amitriptyline (ELAVIL) 25 mg Oral Tablet Take 25 mg by mouth Every night   . aspirin 81 mg Oral Tablet, Chewable Take 81 mg by mouth Once a day   . aspirin/salicylamide/caffeine (BC HEADACHE POWDER ORAL) Take by mouth Once per day  as needed   . baclofen (LIORESAL) 10 mg Oral Tablet Take 1 Tablet (10 mg total) by mouth Four times a day   . Blood Sugar Diagnostic (ACCU-CHEK GUIDE TEST STRIPS) Strip 1 Strip by Does not apply route Four times a day   . Blood-Glucose Meter (ACCU-CHEK GUIDE ME GLUCOSE MTR) Misc by Does not apply route 4 times per day   . budesonide-formoteroL (SYMBICORT) 160-4.5 mcg/actuation Inhalation HFA Aerosol Inhaler Take 2 Puffs by inhalation Twice daily   . calcium carbonate/vitamin D3 (CALCIUM 600 + D ORAL) Take by mouth Once a day   . chlorhexidine gluconate (PERIDEX) 0.12 % Mucous Membrane Mouthwash 15 mL swish and spit Twice daily   . esomeprazole magnesium (NEXIUM) 40 mg Oral Capsule, Delayed Release(E.C.) Take 1 Capsule (40 mg total) by mouth Every morning before breakfast   . fluorouraciL (EFUDEX) 5 % Cream Apply topically Twice daily Apply to lesions as directed   . furosemide (LASIX) 20 mg Oral Tablet Take 20 mg by mouth Once a day   . gabapentin (NEURONTIN) 400 mg Oral Capsule Take 1 Capsule (400 mg total) by mouth Three times a day   . hydroCHLOROthiazide (HYDRODIURIL) 25 mg Oral Tablet Take 1 Tablet (25 mg total) by mouth Once a day   . hydrocortisone (CORTAID) 0.5 % Cream Apply topically Twice per day as needed for up to 14 days   . ketoconazole (NIZORAL) 2 % Shampoo 3x per week 1/2 to 1 oz per shampoo   . Lactobac no.41/Bifidobact no.7 (PROBIOTIC-10 ORAL) Take by mouth Once a day   . Lancets (ACCU-CHEK SOFTCLIX LANCETS) Misc 1 Each by Does not apply route Four times a day   . levothyroxine (SYNTHROID) 175 mcg Oral Tablet Take 175 mcg by mouth Every morning   . lisinopriL (PRINIVIL) 20 mg Oral Tablet Take 1 Tablet (20 mg total) by mouth Twice daily   . Magnesium 250 mg Oral Tablet Take by mouth Once a day   . meloxicam (MOBIC) 15 mg Oral Tablet Take 15 mg by mouth Once per day as needed for Pain   . metFORMIN (GLUCOPHAGE) 500 mg Oral Tablet Take 1 Tablet (500 mg total) by mouth Twice daily with food   . Milk  Thistle 500 mg Oral Capsule Take by mouth Twice daily   . nystatin (MYCOSTATIN) 100,000 unit/gram Cream Apply 0.5 g t.i.d. to affected area.   . nystatin (NYSTOP) 100,000 unit/gram Powder by Apply Topically route Three times a day as needed   . oxybutynin (DITROPAN) 5 mg Oral Tablet Take 1 Tablet (5 mg total) by mouth Twice daily   . pediatric multivitamins Oral Tablet, Chewable Take 1 Tablet by mouth Once a day   . pravastatin (PRAVACHOL) 10 mg Oral Tablet Take 1 Tablet (10 mg total) by mouth Every evening   . sertraline (ZOLOFT) 100 mg Oral Tablet Take 1 Tablet (100 mg total) by mouth Once a day   . terbinafine HCL (LAMISIL AT) 1 % Cream Apply topically Twice daily Apply to nails twice daily with a q tip. Apply to bottoms of feet and toes twice daily for 2-4 weeks or until resolution   . tiotropium bromide (SPIRIVA HANDIHALER) 18 mcg Inhalation Capsule, w/Inhalation Device Take 18 mcg by inhalation Once a day   . turmeric/turmeric ext/pepr ext (TURMERIC-TURMERIC EXT-PEPPER) 500-3 mg Oral Capsule Take by mouth Once a day   . Urea 40 % 40 % Cream by Apply Topically route Twice daily Apply a pea sized amount with a q-tip to nails twice daily       Immunizations:  Immunization History   Administered Date(s) Administered   . Covid-19 Vaccine,Pfizer-BioNTech,53yrs+ 09/22/2019, 10/13/2019       OBJECTIVE    BP 123/69 (Site: Left, Patient Position: Sitting, Cuff Size: Adult)   Pulse 81   Temp 36.2 C (97.1 F) (Thermal Scan)   Resp 19   Ht 1.626 m (5\' 4" )   Wt 99.3 kg (219 lb)   LMP  (LMP Unknown) Comment: stopped having periods at aget 44-46  SpO2 95%   BMI 37.59 kg/m         Nursing Notes:   , LPN  Rodney Booze 77/93/90  Signed   Travel Screening     Question   Response    In the last month, have you been in contact with someone who was confirmed or suspected to have Coronavirus / COVID-19?  No / Unsure    Have you had a COVID-19 viral test in the last 14 days?  No    Do you have any of the following new  or worsening symptoms?  None of these    Have you traveled internationally or domestically in the last month?  No      Travel History   Travel since 04/29/20     No documented travel since 04/29/20            05/01/20, LPN  Rodney Booze [birth year]07  Signed     05/30/20 0943   Depression Screen   Little interest or pleasure in doing things. 1   Feeling down, depressed, or hopeless 1   PHQ 2 Total 2       06/01/20, Rodney Booze  [place] 33/35/45  Signed     05/30/20 0944   Fall Risk Assessment   Do you feel unsteady when standing or walking? No   Do you worry about falling? No   Have you fallen in the past year? Yes   How many times have you fallen? Once   Were you ever injured from falling? No   Timed up and go test (in seconds) 9        Physical Exam:  General: Pleasant, WNWD, NAD, AAO  Head: Normocephalic, AT,no lesions.   Eyes: PERL, EOM's full, conjunctivae clear, fundi grossly normal.   Nose:  no rhinnorhea noted, no lesions  Throat: Clear, no exudates, no lesions MMM and pink.  Neck: Supple, no masses, no thyromegaly, no bruits.   Chest: Lungs clear, no rales, no rhonchi, no wheezes.   Heart: RRR, no murmurs, no rubs, no gallops.   Abdomen: Soft, no tenderness, no masses, BS normal.    Extremities: Normal gait, no deformities, no edema.    Neuro: Physiological, no localizing findings.  CN II- XII grossly intact. DTR's symmetrical.  Skin:  Normal PWD, no rashes,  1 cm x 1 cm red slight lesion left temple with seborrheic dermatitis.    Psych: Mood and Affect normal      ASSESSMENT:        ICD-10-CM    1. Essential hypertension  I10    2. Uncontrolled type 2 diabetes mellitus with hyperglycemia (CMS HCC)  E11.65    3. Gastroesophageal reflux disease without esophagitis  K21.9    4. Mixed hyperlipidemia  E78.2    5. Morbid obesity (CMS HCC)  E66.01    6. Seborrheic dermatitis of scalp  L21.9 hydrocortisone (CORTAID) 0.5 % Cream   7. Mixed stress and urge urinary incontinence  N39.46 oxybutynin (DITROPAN) 5 mg Oral Tablet    8. Former smoker  Z87.891      Body mass index is 37.59 kg/m. BMI addressed: Advised on diet, weight loss, and exercise to reduce above normal BMI.        Laboratory studies/data reviewed  I have reviewed all available and pertinent laboratory studies, images and health maintenance.    PLAN:    Records reviewed today:  Refill needed meds. ATTACHED  Pt was educated/ counseled on the decisions made today with their involvement in these plans.  Immunizations:  Encourage COVID booster  Preventive counseling:  Continue to monitor blood sugars and blood pressures  Diet and exercise reviewed  See dentist and eye doctor with regular scheduled visits.  Active listening/Asked pertinent questions.    A total of (31) minutes was spent on this patient encounter including review of historical information, examination, documentation and post visit activities.  Refer to dermatology for evaluation of seborrheic dermatitis and mouth sores and recurrence thrush.  Order CT of the chest abdomen, COPD, patient reports history of abnormal chest x-ray.  Use hydrocortisone topical as directed seborrheic dermatitis  Refill oxybutynin use as directed for urinary issues follow-up with urology as scheduled.  Continue to monitor blood sugar blood pressure at home take all the medications as directed.  Continue all prescribed medications as directed    DMII - Follow a diabetic diet such as the ADA diet (American Diabetes Association). Take all medication as directed. Exercise and loose weight because even a 10 lb weight loss can make a big difference in your blood sugar. Keep an exercise log and record your weight once a week. Keep a blood sugar log and bring this to all appointments.  Recognize signs, symptoms, and treatment of hypoglycemia (treatment:  Glucose of any form of glucose-containing carbohydrate).  Control blood pressure and in here with antihypertensive medications.  Follow a low-carbohydrate low-fat diet, especially low in  saturated fats.  Lower sodium intake.  Get regular aerobic exercise with a goal of at least 150 minutes per week of moderate intensity exercises.  Maintain a healthy body weight.    HTN Hypertension - Follow a low fat , low cholesterol diet. Gradually  work up to 30 mins a day of exercise with a goal of exercising 3 to 6 days a week. Take all medication as directed. Follow a heart healthy diet and loose weight. BMI goal of 18.5-24.9 kg/m2. Keep a blood pressure log and bring this log to all appointments. .     Hyperlipidemia - Follow a low fat and low cholesterol diet. Exercise start slowly with the goal of exercising 3 to 5 days a week for at least 30 mins per day. Eating five small meals a day is better than three large meals. Keep an exercise log an bring it to your appointments.     Gerd - GERD or acid reflux can cause a chronic cough. Try raising the head of your bed about 4 inches. Avoid eating and drinking 2 hours prior  to going to bed. Alcoholic beverages and drinks with caffeine in them, chocolate, spicy and greasy food should be consumed in moderation. Antacids such as tums or acid blockers such as pepcid, zantax, or tagamet may also be helpful. Loosing weight can also be helpful. Take all medication as directed.     OBESITY - start an exercise program and gradually work up to 30 mins to one hour 5 days a week. IF your can't do that much 10 minutes 3 times a day. Keep a calorie counting log and weigh yourself once a week. Join a waling group it can make exercise more fun. Join a support group. Bring your logs to all visits. Eat a high fiber low fat lower calorie diet.     FOLLOW UP:  Return in about 3 months (around 08/28/2020) for annual wellness exam .    Patient can return sooner if needed.      The patient/care give was given ample opportunity to ask questions and those questions were answered to his/her satisfaction. A good faith effort was made to reconcile the patient's medications. The  patient/caregiver was counseled on any appropriate vaccinations by the provider and questions were answered. The patient/care giver was told to contact me with any additional questions or concerns, or go to the ED in an emergency.       Bing Plume, DO. 05/30/20 10:09  [facility]  [facility]  [address]  [place], [place] [address]  Phone [phone]    This note may have been partially generated using M-Modal Fluency Direct system, and there may be some incorrect words, spellings, and punctuation that were not noted in checking the note before saving.

## 2020-06-02 ENCOUNTER — Telehealth (HOSPITAL_COMMUNITY): Payer: Self-pay

## 2020-06-02 ENCOUNTER — Telehealth (INDEPENDENT_AMBULATORY_CARE_PROVIDER_SITE_OTHER): Payer: Self-pay | Admitting: FAMILY MEDICINE

## 2020-06-02 NOTE — Telephone Encounter (Signed)
Pt called and stated that she was in to see you on Friday and that you sent in a cream for her face but it wasn't the correct cream.  She asked if you could send in an rx for Fluocinonide Cream to [facility].    She is leaving early Tuesday morning to go out of town.

## 2020-06-02 NOTE — Telephone Encounter (Signed)
CT chest scheduled for 06/04/20 at 1300. Notified pt of date and time of exam. States will need to reschedule but defers doing so at this time.

## 2020-06-03 ENCOUNTER — Encounter (INDEPENDENT_AMBULATORY_CARE_PROVIDER_SITE_OTHER): Payer: Self-pay | Admitting: Foot & Ankle Surgery

## 2020-06-03 ENCOUNTER — Other Ambulatory Visit (INDEPENDENT_AMBULATORY_CARE_PROVIDER_SITE_OTHER): Payer: Self-pay | Admitting: FAMILY MEDICINE

## 2020-06-03 DIAGNOSIS — L219 Seborrheic dermatitis, unspecified: Secondary | ICD-10-CM

## 2020-06-03 NOTE — Telephone Encounter (Signed)
Please advise ptn I will prescribe a high dose topical steroid for her face. I can refer her to a dermatologist if she wants.

## 2020-06-03 NOTE — Telephone Encounter (Signed)
Sorry that last message should say I will not again I will not prescribe her a high dose steroid for her face but I will refer her to Dermatology.

## 2020-06-03 NOTE — Telephone Encounter (Signed)
Pt informed of below message, Pts phone lost service (as she previously states she was traveling out of state), and I could not reach her for contact after informing her of message.    Thanks   Rodney Booze, LPN  60/15/6153, 15:55

## 2020-06-03 NOTE — Telephone Encounter (Signed)
Pt states you sent her in Hydrocortisone cream, and she wants rx for Fluocinonide 5% cream sent to [facility]. States if you think she should see a dermatologist then she would like to be referred.     Thanks  Rodney Booze, LPN  99/24/2683, 13:42

## 2020-06-04 ENCOUNTER — Other Ambulatory Visit (INDEPENDENT_AMBULATORY_CARE_PROVIDER_SITE_OTHER): Payer: Self-pay

## 2020-06-04 ENCOUNTER — Other Ambulatory Visit (HOSPITAL_COMMUNITY): Payer: Self-pay

## 2020-07-01 ENCOUNTER — Telehealth (INDEPENDENT_AMBULATORY_CARE_PROVIDER_SITE_OTHER): Payer: Self-pay | Admitting: NURSE PRACTITIONER

## 2020-07-01 ENCOUNTER — Ambulatory Visit
Admission: RE | Admit: 2020-07-01 | Discharge: 2020-07-01 | Disposition: A | Discharge status: Home / Self Care | Payer: Medicare PPO | Source: Ambulatory Visit | Attending: Obstetrics & Gynecology | Admitting: Obstetrics & Gynecology

## 2020-07-01 ENCOUNTER — Other Ambulatory Visit: Payer: Self-pay

## 2020-07-01 ENCOUNTER — Ambulatory Visit (HOSPITAL_BASED_OUTPATIENT_CLINIC_OR_DEPARTMENT_OTHER)
Admission: RE | Admit: 2020-07-01 | Discharge: 2020-07-01 | Disposition: A | Discharge status: Home / Self Care | Payer: Medicare PPO | Source: Ambulatory Visit

## 2020-07-01 DIAGNOSIS — J410 Simple chronic bronchitis: Secondary | ICD-10-CM | POA: Insufficient documentation

## 2020-07-01 DIAGNOSIS — Z1239 Encounter for other screening for malignant neoplasm of breast: Secondary | ICD-10-CM

## 2020-07-01 DIAGNOSIS — J4520 Mild intermittent asthma, uncomplicated: Secondary | ICD-10-CM

## 2020-07-01 DIAGNOSIS — R0602 Shortness of breath: Secondary | ICD-10-CM | POA: Insufficient documentation

## 2020-07-01 DIAGNOSIS — Z1231 Encounter for screening mammogram for malignant neoplasm of breast: Secondary | ICD-10-CM | POA: Insufficient documentation

## 2020-07-01 DIAGNOSIS — Z87891 Personal history of nicotine dependence: Secondary | ICD-10-CM

## 2020-07-01 DIAGNOSIS — J439 Emphysema, unspecified: Secondary | ICD-10-CM

## 2020-07-09 ENCOUNTER — Encounter (INDEPENDENT_AMBULATORY_CARE_PROVIDER_SITE_OTHER): Payer: Self-pay | Admitting: FAMILY MEDICINE

## 2020-07-09 ENCOUNTER — Other Ambulatory Visit: Payer: Self-pay

## 2020-07-09 ENCOUNTER — Telehealth (INDEPENDENT_AMBULATORY_CARE_PROVIDER_SITE_OTHER): Payer: Self-pay | Admitting: FAMILY MEDICINE

## 2020-07-09 ENCOUNTER — Ambulatory Visit: Payer: Medicare PPO | Attending: FAMILY MEDICINE | Admitting: FAMILY MEDICINE

## 2020-07-09 ENCOUNTER — Other Ambulatory Visit (HOSPITAL_COMMUNITY): Payer: Medicare PPO

## 2020-07-09 VITALS — BP 112/65 | HR 87 | Temp 97.3°F | Resp 18 | Ht 64.0 in | Wt 219.0 lb

## 2020-07-09 DIAGNOSIS — Z791 Long term (current) use of non-steroidal anti-inflammatories (NSAID): Secondary | ICD-10-CM | POA: Insufficient documentation

## 2020-07-09 DIAGNOSIS — Z6837 Body mass index (BMI) 37.0-37.9, adult: Secondary | ICD-10-CM | POA: Insufficient documentation

## 2020-07-09 DIAGNOSIS — R809 Proteinuria, unspecified: Secondary | ICD-10-CM | POA: Insufficient documentation

## 2020-07-09 DIAGNOSIS — Z79899 Other long term (current) drug therapy: Secondary | ICD-10-CM | POA: Insufficient documentation

## 2020-07-09 DIAGNOSIS — E1165 Type 2 diabetes mellitus with hyperglycemia: Secondary | ICD-10-CM | POA: Insufficient documentation

## 2020-07-09 DIAGNOSIS — I700 Atherosclerosis of aorta: Secondary | ICD-10-CM | POA: Insufficient documentation

## 2020-07-09 DIAGNOSIS — Z87891 Personal history of nicotine dependence: Secondary | ICD-10-CM | POA: Insufficient documentation

## 2020-07-09 DIAGNOSIS — Z7984 Long term (current) use of oral hypoglycemic drugs: Secondary | ICD-10-CM | POA: Insufficient documentation

## 2020-07-09 DIAGNOSIS — M545 Low back pain, unspecified: Secondary | ICD-10-CM | POA: Insufficient documentation

## 2020-07-09 DIAGNOSIS — Z7982 Long term (current) use of aspirin: Secondary | ICD-10-CM | POA: Insufficient documentation

## 2020-07-09 LAB — URINALYSIS, MACRO/MICRO
BILIRUBIN: NEGATIVE mg/dL
BLOOD: NEGATIVE mg/dL
GLUCOSE: NEGATIVE mg/dL
KETONES: NEGATIVE mg/dL
LEUKOCYTES: NEGATIVE WBCs/uL
NITRITE: NEGATIVE
PH: 7 (ref 4.6–8.0)
PROTEIN: NEGATIVE mg/dL
SPECIFIC GRAVITY: 1.02 (ref 1.005–1.030)
UROBILINOGEN: 1 mg/dL (ref 0.2–1.0)

## 2020-07-09 NOTE — Nursing Note (Signed)
07/09/20 1201   Depression Screen   Little interest or pleasure in doing things. 0   Feeling down, depressed, or hopeless 0   PHQ 2 Total 0

## 2020-07-09 NOTE — Telephone Encounter (Signed)
Pt states she forgot to discuss with you today that she has excess amounts of mucous in her throat.     Thanks  Rodney Booze, LPN  94/49/6759, 17:20

## 2020-07-09 NOTE — Nursing Note (Signed)
07/09/20 1201   Fall Risk Assessment   Do you feel unsteady when standing or walking? No   Do you worry about falling? No   Have you fallen in the past year? No

## 2020-07-09 NOTE — Progress Notes (Signed)
PRIMARY CARE, MEDICAL OFFICE BUILDING 5  [address]  [place] [address]      Operated by [facility]     Name: Angela Frey MRN:  [id number]   Date: 07/09/2020 Age: 59 y.o.      Chief Complaint   Patient presents with    Back Pain     Pt c/o left back pain, protein in urine       SUBJECTIVE    Patient is 59 y.o. she  presents today for left back pain and a complaint that she has protein in her urine.  She say home healthy in [place] checked her urine told her she as a trace of protein in her urine.  I explained to her she is a diabetic not well controlled that is probably why she had some protein in her urine but I will recheck it.  Today she wants know about her labs which she did not make her follow-up appointment to see review her labs.  Her hemoglobin A1c was elevated at 7.6 she needs better control her glucose.  She does not like taking the metformin she wants to change the medicine because of her friend having a kidney problem with metformin.  I explained her metformin is a 1. Medication.  She wants to know why she has all these abnormalities.  I explained most of them are due to her diabetes high blood pressure and being overweight such as cholesterol plaques.  She saw this on her report hand will to know exactly why she has these.  He is having severe back pain causes her leg to go numb she has been to [facility] in the past per patient and she would like to see someone in [place] for this.  Patient's is a dermatologist never called her and she wants to see the dermatologist.  The referral was completed have someone follow-up to see what the dermatologist has not called her.    ROS:      ROS - pertinent for presenting problem.  Systems otherwise negative than what has been noted.    PAST MEDICAL HISTORY  I have reviewed and updated as appropriate the past medical, surgical, family, and social history today:    Medical History/Surgical History/Family History/Social  History  Past Medical History:   Diagnosis Date    Asthma     Diabetes (CMS HCC)     Hypertension     Hypothyroid          Past Surgical History:   Procedure Laterality Date    HX TUBAL LIGATION      LEG SURGERY       Family Medical History:    None          Social History     Socioeconomic History    Marital status: Unknown     Spouse name: Not on file    Number of children: Not on file    Years of education: Not on file    Highest education level: Not on file   Tobacco Use    Smoking status: Former Smoker    Smokeless tobacco: Never Used     Social Determinants of Psychologist, prison and probation services Strain: Medium Risk    Difficulty of Paying Living Expenses: Somewhat hard   Food Insecurity: No Food Insecurity    Worried About Programme researcher, broadcasting/film/video in the Last Year: Never true    Ran Out of Food in the Last Year: Never true  Transportation Needs: No Engineer, petroleum (Medical): No    Lack of Transportation (Non-Medical): No   Physical Activity: Inactive    Days of Exercise per Week: 0 days    Minutes of Exercise per Session: 0 min   Stress: Stress Concern Present    Feeling of Stress : Very much   Intimate Partner Violence: Not At Risk    Fear of Current or Ex-Partner: No    Emotionally Abused: No    Physically Abused: No    Sexually Abused: No       Allergies:  Allergies   Allergen Reactions    Cleocin [Clindamycin] Hives/ Urticaria    Doxylamin-Pse-Dm-Acetaminophen Hives/ Urticaria and  Other Adverse Reaction (Add comment)    Tessalon [Benzonatate] Hives/ Urticaria       Medications:  Current Outpatient Medications   Medication Sig    albuterol sulfate (PROVENTIL OR VENTOLIN OR PROAIR) 90 mcg/actuation Inhalation HFA Aerosol Inhaler Take 2 Puffs by inhalation Every 6 hours as needed    albuterol sulfate (PROVENTIL) 2.5 mg /3 mL (0.083 %) Inhalation Solution for Nebulization 2.5 mg by Nebulization route Every 4 hours as needed for Wheezing    amitriptyline  (ELAVIL) 25 mg Oral Tablet Take 25 mg by mouth Every night    aspirin 81 mg Oral Tablet, Chewable Take 81 mg by mouth Once a day    baclofen (LIORESAL) 10 mg Oral Tablet Take 1 Tablet (10 mg total) by mouth Four times a day (Patient taking differently: Take 1 qam, 1 at noon, 2 qpm )    Blood Sugar Diagnostic (ACCU-CHEK GUIDE TEST STRIPS) Strip 1 Strip by Does not apply route Four times a day    Blood-Glucose Meter (ACCU-CHEK GUIDE ME GLUCOSE MTR) Misc by Does not apply route 4 times per day    budesonide-formoteroL (SYMBICORT) 160-4.5 mcg/actuation Inhalation HFA Aerosol Inhaler Take 2 Puffs by inhalation Twice daily    calcium carbonate/vitamin D3 (CALCIUM 600 + D ORAL) Take by mouth Once a day    chlorhexidine gluconate (PERIDEX) 0.12 % Mucous Membrane Mouthwash 15 mL swish and spit Twice daily    esomeprazole magnesium (NEXIUM) 40 mg Oral Capsule, Delayed Release(E.C.) Take 1 Capsule (40 mg total) by mouth Every morning before breakfast    fluorouraciL (EFUDEX) 5 % Cream Apply topically Twice daily Apply to lesions as directed    gabapentin (NEURONTIN) 400 mg Oral Capsule Take 1 Capsule (400 mg total) by mouth Three times a day    hydroCHLOROthiazide (HYDRODIURIL) 25 mg Oral Tablet Take 1 Tablet (25 mg total) by mouth Once a day    ketoconazole (NIZORAL) 2 % Shampoo 3x per week 1/2 to 1 oz per shampoo    Lactobac no.41/Bifidobact no.7 (PROBIOTIC-10 ORAL) Take by mouth Once a day    Lancets (ACCU-CHEK SOFTCLIX LANCETS) Misc 1 Each by Does not apply route Four times a day    levothyroxine (SYNTHROID) 175 mcg Oral Tablet Take 1 Tablet (175 mcg total) by mouth Every morning    lisinopriL (PRINIVIL) 20 mg Oral Tablet Take 1 Tablet (20 mg total) by mouth Twice daily    Magnesium 250 mg Oral Tablet Take by mouth Once a day    meloxicam (MOBIC) 15 mg Oral Tablet Take 1 Tablet (15 mg total) by mouth Once per day as needed for Pain    metFORMIN (GLUCOPHAGE) 500 mg Oral Tablet Take 1 Tablet (500 mg total)  by mouth Twice daily with food  Milk Thistle 500 mg Oral Capsule Take by mouth Twice daily    nystatin (MYCOSTATIN) 100,000 unit/gram Cream Apply 0.5 g t.i.d. to affected area.    nystatin (NYSTOP) 100,000 unit/gram Powder by Apply Topically route Three times a day as needed    pediatric multivitamins Oral Tablet, Chewable Take 1 Tablet by mouth Once a day    pravastatin (PRAVACHOL) 10 mg Oral Tablet Take 1 Tablet (10 mg total) by mouth Every evening    sertraline (ZOLOFT) 100 mg Oral Tablet Take 1 Tablet (100 mg total) by mouth Once a day    solifenacin (VESICARE) 10 mg Oral Tablet Take 10 mg by mouth    terbinafine HCL (LAMISIL AT) 1 % Cream Apply topically Twice daily Apply to nails twice daily with a q tip. Apply to bottoms of feet and toes twice daily for 2-4 weeks or until resolution    tiotropium bromide (SPIRIVA HANDIHALER) 18 mcg Inhalation Capsule, w/Inhalation Device Take 18 mcg by inhalation Once a day    Urea 40 % 40 % Cream by Apply Topically route Twice daily Apply a pea sized amount with a q-tip to nails twice daily       Immunizations:  Immunization History   Administered Date(s) Administered    Covid-19 Vaccine,Pfizer-BioNTech,81yrs+ 09/22/2019, 10/13/2019       OBJECTIVE    BP 112/65 (Site: Left, Patient Position: Sitting, Cuff Size: Adult)    Pulse 87    Temp 36.3 C (97.3 F) (Thermal Scan)    Resp 18    Ht 1.626 m (5\' 4" )    Wt 99.3 kg (219 lb)    LMP  (LMP Unknown) Comment: stopped having periods at aget 44-46   SpO2 93%    BMI 37.59 kg/m         Nursing Notes:   , LPN  Rodney Booze [birth year]  Signed   Travel Screening     Question   Response    In the last month, have you been in contact with someone who was confirmed or suspected to have Coronavirus / COVID-19?  No / Unsure    Have you had a COVID-19 viral test in the last 14 days?  No    Do you have any of the following new or worsening symptoms?  None of these    Have you traveled internationally or domestically in the  last month?  No      Travel History   Travel since 06/09/20     No documented travel since 06/09/20            06/11/20, LPN  Rodney Booze 27/[birth year]  Signed     07/09/20 1201   Fall Risk Assessment   Do you feel unsteady when standing or walking? No   Do you worry about falling? No   Have you fallen in the past year? No       07/11/20, Rodney Booze  [place] 76/72/09  Signed     07/09/20 1201   Depression Screen   Little interest or pleasure in doing things. 0   Feeling down, depressed, or hopeless 0   PHQ 2 Total 0        Physical Exam:  General: Pleasant, WNWD, NAD, AAO  Head: Normocephalic, AT,no lesions.   Eyes: PERL, EOM's full, conjunctivae clear, fundi grossly normal.   Nose:  no rhinnorhea noted, no lesions  Throat: Clear, no exudates, no lesions MMM and pink.  Neck: Supple, no masses, no thyromegaly,  no bruits.   Chest: Lungs clear, no rales, no rhonchi, no wheezes.   Heart: RRR, no murmurs, no rubs, no gallops.    Abdomen: Soft, no tenderness, no masses, BS normal.    Extremities: Normal gait, no deformities, no edema.    Neuro: Physiological, no localizing findings.   Psych: Mood and Affect normal      ASSESSMENT:        ICD-10-CM    1. Proteinuria, unspecified type  R80.9 URINALYSIS WITH REFLEX MICROSCOPIC AND CULTURE IF POSITIVE   2. Morbid obesity (CMS HCC)  E66.01    3. Lumbar pain  M54.50 Refer to External Provider   4. Uncontrolled type 2 diabetes mellitus with hyperglycemia (CMS HCC)  E11.65    5. Abdominal aortic atherosclerosis (CMS HCC)  I70.0      Body mass index is 37.59 kg/m. BMI addressed: Advised on diet, weight loss, and exercise to reduce above normal BMI.        Laboratory studies/data reviewed  I have reviewed all available and pertinent laboratory studies, images and health maintenance.    PLAN:    Records reviewed today:  Refill needed meds. ATTACHED  Pt was educated/ counseled on the decisions made today with their involvement in these plans.   Preventive counseling: dMii  Diet and exercise  reviewed  See dentist and eye doctor with regular scheduled visits.  Active listening/Asked pertinent questions.    A total of (21) minutes was spent on this patient encounter including review of historical information, examination, documentation and post visit activities.  Contact Dermatology to see if they got the referral  First Ortho Neuro trial stem for evaluation of back pain and leg numbness  Reviewed help with patient completed on 02/20/2020 CBC, CMP, hemoglobin A1c, lipids, TSH, urine microalbumin, vitamin-D.  Reviewed CT chest with patient today completed on 07/01/2020.  Order urinalysis check for protein urine will call with results  Continue all prescribed medications as directed    FOLLOW UP:  Return for as scheduled .    Patient can return sooner if needed.      The patient/care give was given ample opportunity to ask questions and those questions were answered to his/her satisfaction. A good faith effort was made to reconcile the patient's medications. The patient/caregiver was counseled on any appropriate vaccinations by the provider and questions were answered. The patient/care giver was told to contact me with any additional questions or concerns, or go to the ED in an emergency.       Bing PlumeLucy K , DO. 07/09/20 12:27  [facility]: [facility]  [facility]  [address]  [place], [place] [id number]  Phone [phone]    This note may have been partially generated using M-Modal Fluency Direct system, and there may be some incorrect words, spellings, and punctuation that were not noted in checking the note before saving.

## 2020-07-09 NOTE — Telephone Encounter (Signed)
Patient says the dermatologist did not call her could be check on this referral please.

## 2020-07-09 NOTE — Nursing Note (Signed)
Travel Screening     Question   Response    In the last month, have you been in contact with someone who was confirmed or suspected to have Coronavirus / COVID-19?  No / Unsure    Have you had a COVID-19 viral test in the last 14 days?  No    Do you have any of the following new or worsening symptoms?  None of these    Have you traveled internationally or domestically in the last month?  No      Travel History   Travel since 06/09/20     No documented travel since 06/09/20

## 2020-07-10 ENCOUNTER — Telehealth (INDEPENDENT_AMBULATORY_CARE_PROVIDER_SITE_OTHER): Payer: Self-pay | Admitting: FAMILY MEDICINE

## 2020-07-10 NOTE — Telephone Encounter (Signed)
Please advise ptn she can gargle with warm salt water several times a day and take frequent sips of water she can take Claritin allegra or zyrtec

## 2020-07-10 NOTE — Telephone Encounter (Signed)
Left VM at [phone] to inform pt of below information    Thanks  Rodney Booze, LPN  09/64/3838, 08:54

## 2020-07-10 NOTE — Telephone Encounter (Signed)
Spoke to [facility] states Dermatology this morning, pt scheduled with them on 08/06/2020 at 2:15p, left Vm at [phone] to inform pt of appt date and time.     Thanks  Rodney Booze, LPN  96/22/2979, 08:57

## 2020-07-10 NOTE — Telephone Encounter (Signed)
Pt called and stated [facility] in [place] is too far. She requests a new referral sent to a dermatologist in [place].

## 2020-07-14 ENCOUNTER — Telehealth (INDEPENDENT_AMBULATORY_CARE_PROVIDER_SITE_OTHER): Payer: Self-pay | Admitting: FAMILY MEDICINE

## 2020-07-14 NOTE — Telephone Encounter (Signed)
Pt called and asked that I let you know that she wants to be referred to [facility].    She also asked me to let you know that she has not heard from the referral for her back.

## 2020-07-15 ENCOUNTER — Other Ambulatory Visit (INDEPENDENT_AMBULATORY_CARE_PROVIDER_SITE_OTHER): Payer: Self-pay | Admitting: FAMILY MEDICINE

## 2020-07-15 DIAGNOSIS — L989 Disorder of the skin and subcutaneous tissue, unspecified: Secondary | ICD-10-CM

## 2020-07-15 NOTE — Telephone Encounter (Signed)
Please advise patient she has been referred to dermatology as well as to a back specialist they do not call her within a week or 2 they will get back to her as soon as they can which may take up to a month or more.

## 2020-07-15 NOTE — Telephone Encounter (Signed)
Pt informed and verbalized understanding.     Rodney Booze, LPN  11/17/4705, 61:51

## 2020-07-16 ENCOUNTER — Other Ambulatory Visit (HOSPITAL_COMMUNITY): Payer: Self-pay

## 2020-07-18 ENCOUNTER — Telehealth (INDEPENDENT_AMBULATORY_CARE_PROVIDER_SITE_OTHER): Payer: Self-pay | Admitting: Obstetrics & Gynecology

## 2020-07-18 NOTE — Telephone Encounter (Signed)
Notify pt that mammogram did not reveal any evidence of malignancy. mk

## 2020-07-30 ENCOUNTER — Telehealth (INDEPENDENT_AMBULATORY_CARE_PROVIDER_SITE_OTHER): Payer: Self-pay | Admitting: FAMILY MEDICINE

## 2020-07-30 NOTE — Telephone Encounter (Signed)
Pt returned your call stating that you had contacted her about some results, but she lost connection and was calling you back.

## 2020-08-05 NOTE — Telephone Encounter (Signed)
I did not call pt recently, not sure what she is talking about but it was not me she spoke to    Thanks  Rodney Booze, LPN  8/67/6720, 09:26

## 2020-08-25 ENCOUNTER — Other Ambulatory Visit (INDEPENDENT_AMBULATORY_CARE_PROVIDER_SITE_OTHER): Payer: Self-pay | Admitting: FAMILY MEDICINE

## 2020-08-25 DIAGNOSIS — E1165 Type 2 diabetes mellitus with hyperglycemia: Secondary | ICD-10-CM

## 2020-08-25 DIAGNOSIS — E782 Mixed hyperlipidemia: Secondary | ICD-10-CM

## 2020-08-25 DIAGNOSIS — M792 Neuralgia and neuritis, unspecified: Secondary | ICD-10-CM

## 2020-08-25 NOTE — Telephone Encounter (Signed)
Refill Request    Last Appt.       Future Appt.       07/09/2020 09/02/2020

## 2020-09-02 ENCOUNTER — Encounter (INDEPENDENT_AMBULATORY_CARE_PROVIDER_SITE_OTHER): Payer: Self-pay | Admitting: FAMILY MEDICINE

## 2020-09-05 ENCOUNTER — Encounter (INDEPENDENT_AMBULATORY_CARE_PROVIDER_SITE_OTHER): Payer: Self-pay | Admitting: FAMILY MEDICINE

## 2020-09-06 ENCOUNTER — Other Ambulatory Visit: Payer: Self-pay | Admitting: Family Medicine

## 2020-09-06 ENCOUNTER — Other Ambulatory Visit (INDEPENDENT_AMBULATORY_CARE_PROVIDER_SITE_OTHER): Payer: Self-pay | Admitting: FAMILY MEDICINE

## 2020-09-06 DIAGNOSIS — I10 Essential (primary) hypertension: Secondary | ICD-10-CM

## 2020-09-06 DIAGNOSIS — J432 Centrilobular emphysema: Secondary | ICD-10-CM

## 2020-09-06 NOTE — Telephone Encounter (Signed)
Follow-up: Advised her that if she decides to stay in [place] longer than 3 months at a time, she may need to relocate to PCP in [place]. Or find an additional primary care doctor there to help her, as I cannot manage her meds if she stays in [facility] longer term. She understands and is mostly in [place] but will now be in [place] for about 1 month. I agreed to send 90 day rx to [place] for her for this time. Message sent to pt to clarify if she has PCP in [place]. Note written by PCP on 11/20/19- Pt wanting rx to go to [facility]. Pharmacy.

## 2020-09-10 NOTE — Telephone Encounter (Signed)
Please see below refill request    Rodney Booze, LPN  11/17/4705, 61:51

## 2020-09-28 IMAGING — CT CT CHEST W/O CM
2 of 4 series · 15 of 36 positions shown, 18 images · non-contrast
Comparison: Chest CT 02/07/2017.

CLINICAL DATA: Chest pain with shortness of breath for 1 year.
History of smoking. No previous relevant surgery.

EXAM:
CT CHEST WITHOUT CONTRAST
TECHNIQUE: Multidetector CT imaging of the chest was performed following the
standard protocol without IV contrast.

[Series 2: coronal · coronal · 0.66mm/px · 3 of 153 slices shown]
[im 31/153  lung]
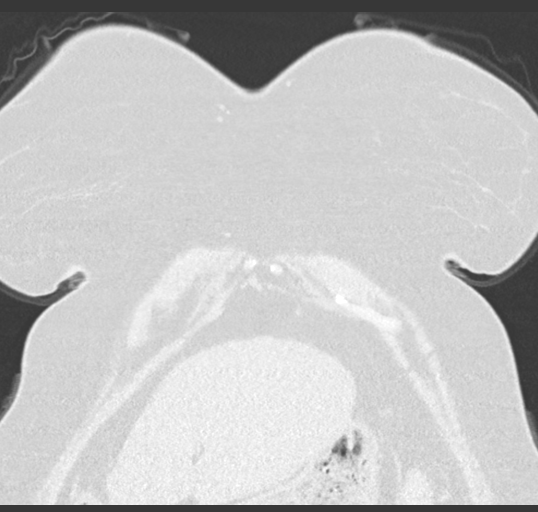
[im 61/153  lung]
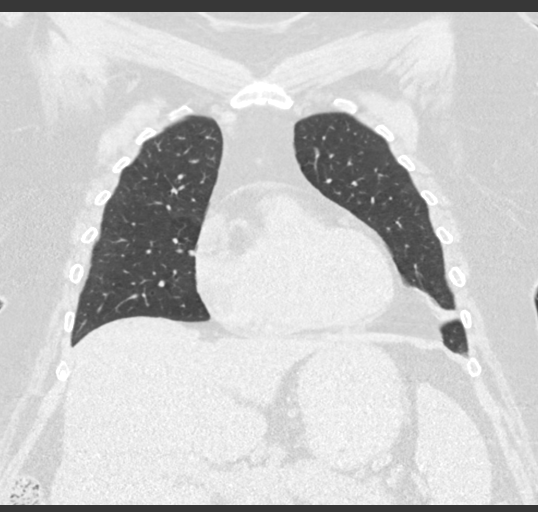
[im 92/153  lung]
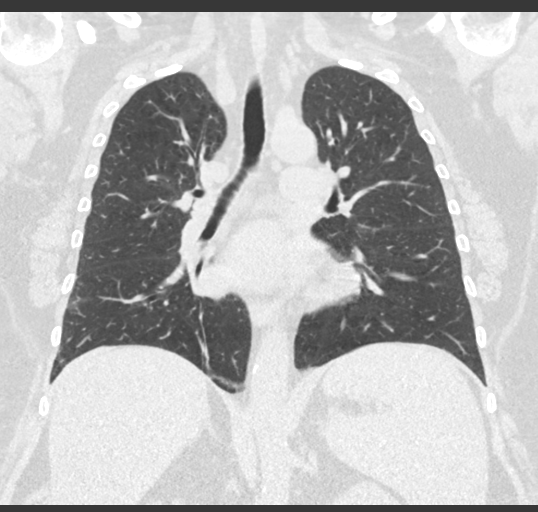

[Series 4: thorax · axial · 0.63mm/px · z∈[-488,-212]mm · 12 of 162 slices shown, 15 images]
[im 12/162  mediastinal]
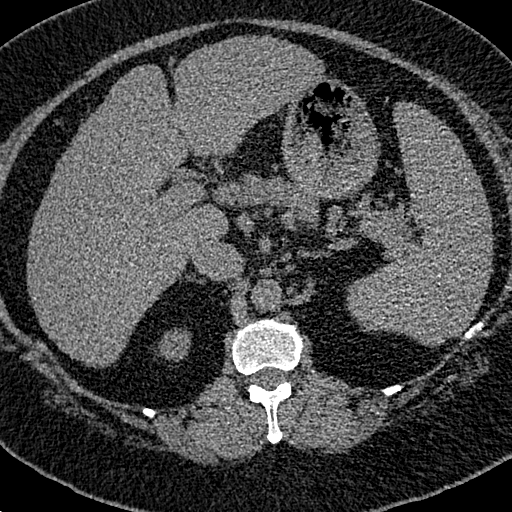
[im 12/162  lung]
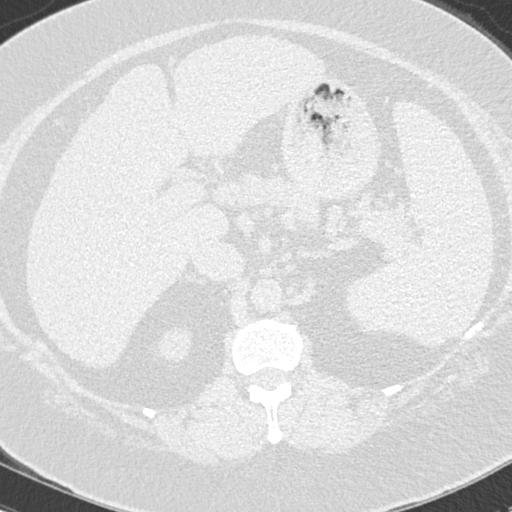
[im 24/162  lung]
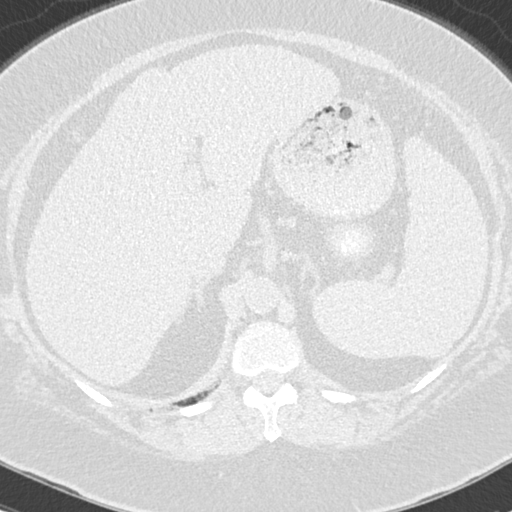
[im 35/162  lung]
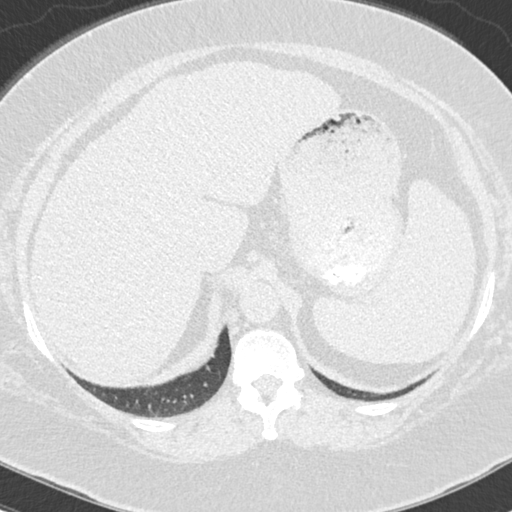
[im 47/162  lung]
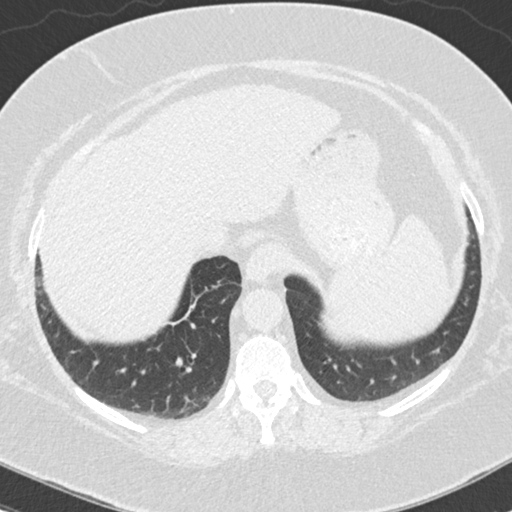
[im 58/162  mediastinal]
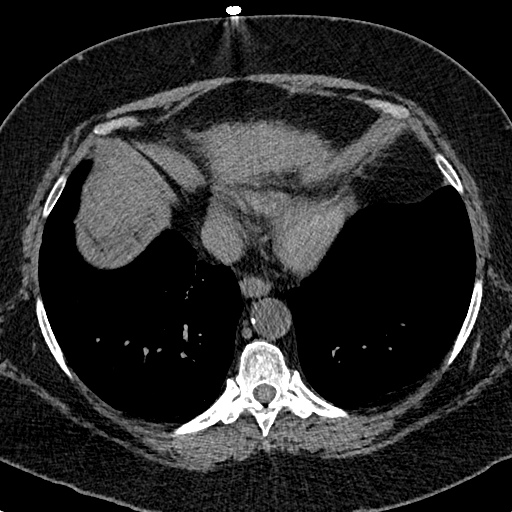
[im 58/162  lung]
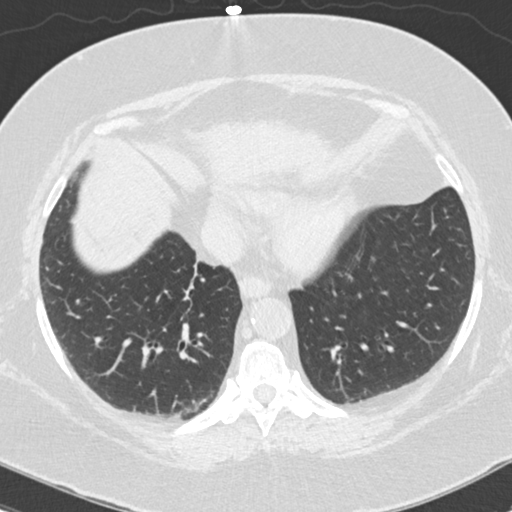
[im 70/162  lung]
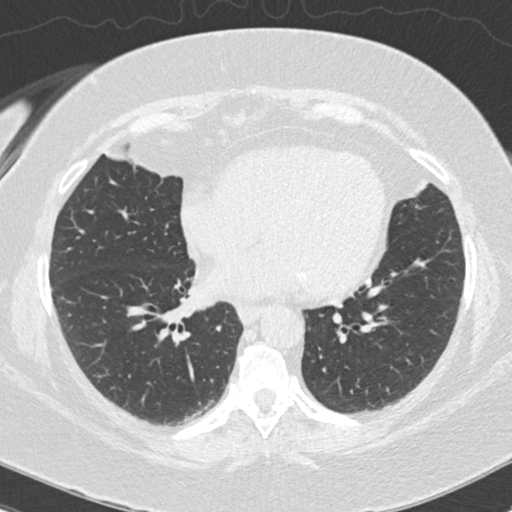
[im 93/162  lung]
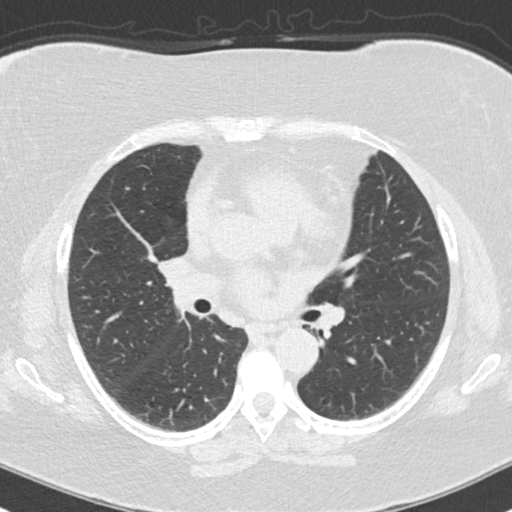
[im 104/162  lung]
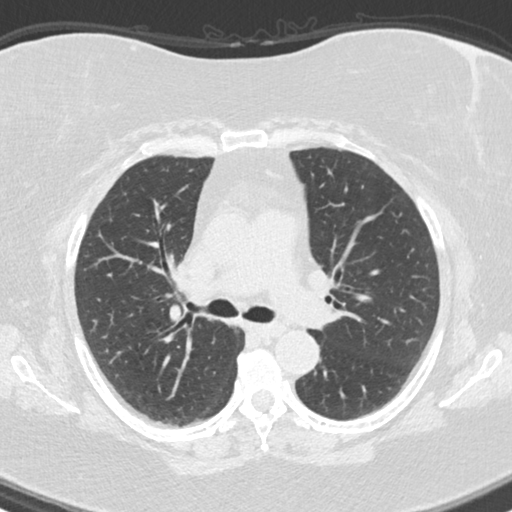
[im 116/162  mediastinal]
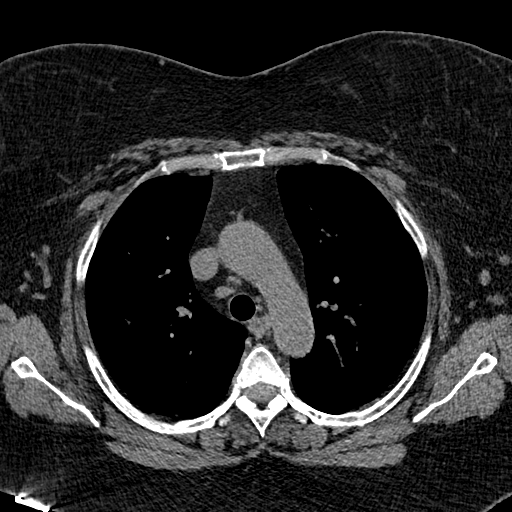
[im 116/162  lung]
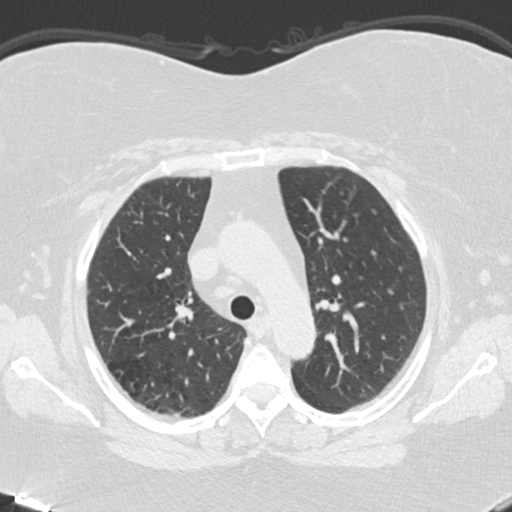
[im 127/162  lung]
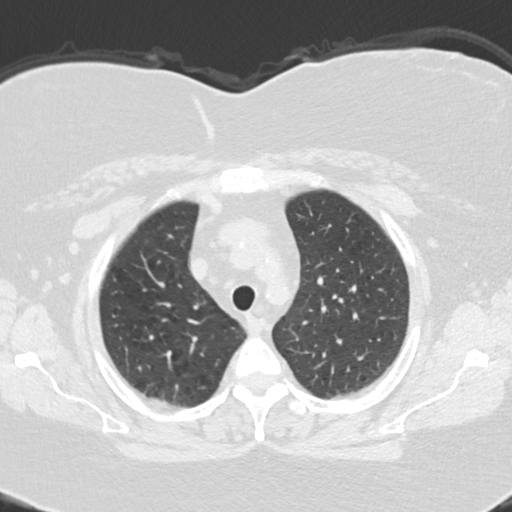
[im 139/162  lung]
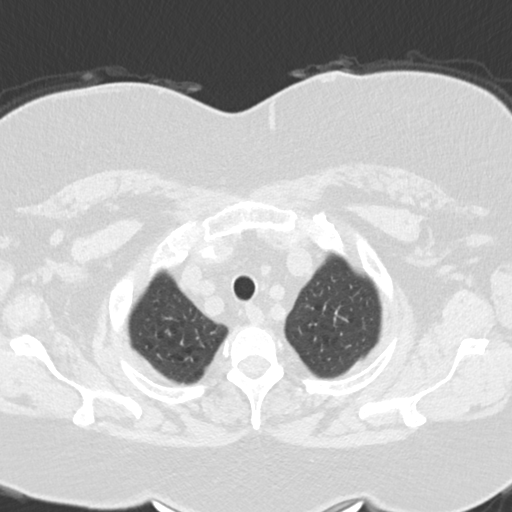
[im 150/162  lung]
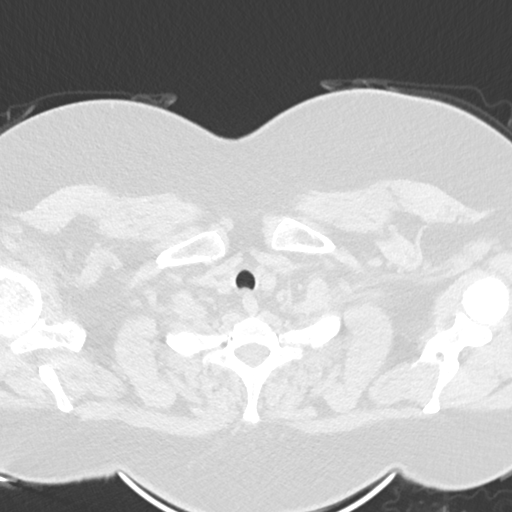

[15 of 36 positions shown; findings below may reference images not displayed]

FINDINGS: Cardiovascular: Mild atherosclerosis of the aorta and great vessels,
similar to previous study. No acute vascular findings on noncontrast
imaging. The heart size is normal. There is no pericardial effusion.

Mediastinum/Nodes: There are no enlarged mediastinal, hilar or
axillary lymph nodes.Small mediastinal lymph nodes are stable. Hilar
assessment is limited by the lack of intravenous contrast, although
the hilar contours appear unchanged. The thyroid gland, trachea and
esophagus demonstrate no significant findings.

Lungs/Pleura: There is no pleural effusion or pneumothorax. Stable
mild centrilobular emphysema and linear scarring at the right lung
base. No suspicious pulmonary nodularity.

Upper abdomen: The visualized upper abdomen appears stable with
contour irregularity of the liver suspicious for cirrhosis. There is
mild splenomegaly. No adrenal mass.

Musculoskeletal/Chest wall: There is no chest wall mass or
suspicious osseous finding.
IMPRESSION: 1. No acute chest findings or explanation for the patient's
symptoms.
2. Stable mild emphysema and scarring at the right lung base.
3. Stable appearance of the visualized upper abdomen with findings
suggesting cirrhosis and early portal hypertension manifesting as
splenomegaly.
4. Aortic Atherosclerosis (BK3AB-0H2.2) and Emphysema (BK3AB-28O.T).

## 2020-10-01 ENCOUNTER — Encounter (INDEPENDENT_AMBULATORY_CARE_PROVIDER_SITE_OTHER): Payer: Self-pay | Admitting: FAMILY MEDICINE

## 2020-10-14 ENCOUNTER — Telehealth (INDEPENDENT_AMBULATORY_CARE_PROVIDER_SITE_OTHER): Payer: Self-pay | Admitting: FAMILY MEDICINE

## 2020-10-14 ENCOUNTER — Encounter (INDEPENDENT_AMBULATORY_CARE_PROVIDER_SITE_OTHER): Payer: Self-pay | Admitting: FAMILY MEDICINE

## 2020-10-14 ENCOUNTER — Other Ambulatory Visit: Payer: Self-pay

## 2020-10-14 ENCOUNTER — Ambulatory Visit: Payer: Medicare PPO | Attending: FAMILY MEDICINE | Admitting: FAMILY MEDICINE

## 2020-10-14 VITALS — BP 102/62 | HR 86 | Temp 97.3°F | Ht 64.0 in | Wt 218.2 lb

## 2020-10-14 DIAGNOSIS — M549 Dorsalgia, unspecified: Secondary | ICD-10-CM | POA: Insufficient documentation

## 2020-10-14 DIAGNOSIS — I10 Essential (primary) hypertension: Secondary | ICD-10-CM | POA: Insufficient documentation

## 2020-10-14 DIAGNOSIS — E1165 Type 2 diabetes mellitus with hyperglycemia: Secondary | ICD-10-CM | POA: Insufficient documentation

## 2020-10-14 DIAGNOSIS — Z1211 Encounter for screening for malignant neoplasm of colon: Secondary | ICD-10-CM | POA: Insufficient documentation

## 2020-10-14 DIAGNOSIS — J410 Simple chronic bronchitis: Secondary | ICD-10-CM | POA: Insufficient documentation

## 2020-10-14 DIAGNOSIS — Z0000 Encounter for general adult medical examination without abnormal findings: Secondary | ICD-10-CM | POA: Insufficient documentation

## 2020-10-14 DIAGNOSIS — E782 Mixed hyperlipidemia: Secondary | ICD-10-CM | POA: Insufficient documentation

## 2020-10-14 DIAGNOSIS — Z124 Encounter for screening for malignant neoplasm of cervix: Secondary | ICD-10-CM | POA: Insufficient documentation

## 2020-10-14 DIAGNOSIS — F321 Major depressive disorder, single episode, moderate: Secondary | ICD-10-CM | POA: Insufficient documentation

## 2020-10-14 DIAGNOSIS — Z131 Encounter for screening for diabetes mellitus: Secondary | ICD-10-CM | POA: Insufficient documentation

## 2020-10-14 DIAGNOSIS — Z7984 Long term (current) use of oral hypoglycemic drugs: Secondary | ICD-10-CM | POA: Insufficient documentation

## 2020-10-14 DIAGNOSIS — M25519 Pain in unspecified shoulder: Secondary | ICD-10-CM | POA: Insufficient documentation

## 2020-10-14 DIAGNOSIS — I700 Atherosclerosis of aorta: Secondary | ICD-10-CM | POA: Insufficient documentation

## 2020-10-14 DIAGNOSIS — Z87891 Personal history of nicotine dependence: Secondary | ICD-10-CM | POA: Insufficient documentation

## 2020-10-14 DIAGNOSIS — J4520 Mild intermittent asthma, uncomplicated: Secondary | ICD-10-CM | POA: Insufficient documentation

## 2020-10-14 DIAGNOSIS — Z6837 Body mass index (BMI) 37.0-37.9, adult: Secondary | ICD-10-CM | POA: Insufficient documentation

## 2020-10-14 MED ORDER — HYDROCHLOROTHIAZIDE 25 MG TABLET
25.0000 mg | ORAL_TABLET | Freq: Every day | ORAL | 1 refills | Status: DC
Start: 2020-10-14 — End: 2021-07-01

## 2020-10-14 MED ORDER — SERTRALINE 100 MG TABLET
100.0000 mg | ORAL_TABLET | Freq: Every day | ORAL | 1 refills | Status: DC
Start: 2020-10-14 — End: 2021-07-01

## 2020-10-14 MED ORDER — ALBUTEROL SULFATE 2.5 MG/3 ML (0.083 %) SOLUTION FOR NEBULIZATION
2.5000 mg | INHALATION_SOLUTION | Freq: Four times a day (QID) | RESPIRATORY_TRACT | 5 refills | Status: DC | PRN
Start: 2020-10-14 — End: 2021-07-01

## 2020-10-14 MED ORDER — ESOMEPRAZOLE MAGNESIUM 40 MG CAPSULE,DELAYED RELEASE
40.0000 mg | DELAYED_RELEASE_CAPSULE | Freq: Every morning | ORAL | 1 refills | Status: DC
Start: 2020-10-14 — End: 2021-06-22

## 2020-10-14 MED ORDER — BREO ELLIPTA 100 MCG-25 MCG/DOSE POWDER FOR INHALATION
1.0000 | DISK | Freq: Every day | RESPIRATORY_TRACT | 1 refills | Status: DC
Start: 2020-10-14 — End: 2021-01-27
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
## 2020-10-14 NOTE — Nursing Note (Signed)
10/14/20 1500   Comprehensive Health Assessment-Adult   Do you wish to complete this form? Yes   During the past 4 weeks, how would you rate your health in general? Fair   During the past 4 weeks, how much difficulty have you had doing your usual activities inside and outside your home because of medical or emotional problems? Much difficulty   During the past 4 weeks, was someone available to help you if you needed and wanted help? No   In the past year, how many times have you gone to the emergency department or been admitted to a hospital for a health problem? None   During the past 4 weeks, how much bodily pain have you typically had? Severe pain   Do you currently use opioid pain medications, take regularly or keep on hand for use as needed (medication such as hydrocodone, oxycodone, codeine or morphine)? No   Are you generally satisfied with your sleep? No   Do you have enough money to buy things you need in everyday life, such as food, clothing, medicines, and housing? Sometimes   Can you get to places beyond walking distance without help?  (For example, can you drive your own car or travel alone on buses)? Yes   Do you fasten your seatbelt when you are in a car? Yes, usually   Do you exercise 20 minutes 3 or more days per week (such as walking, dancing, biking, mowing grass, swimming)? No, I usually don't exercise this much   How often do you eat food that is healthy (fruits, vegetables, lean meats) instead of unhealthy (sweets, fast food, junk food, fatty foods)? A little bit of the time   How often do you have trouble taking medicines the eay you are told to take them?   (NA pt states it make no sense wont answer)   Do you need any help communicating with your doctors and nurses because of vision or hearing problems? No   During the past 12 months, have you experienced confusion or memory loss that is happening more often or is getting worse? No   Are you now also seeing any specialist physician(s)  (such as eye doctor, foot doctor, skin doctor)? Yes   If you are seeing a specialist for anything such as foot, eye, skin, etc.  please list their name(s) eye, skin   How confident are you that you can control or manage most of your health problems? Very confident

## 2020-10-14 NOTE — Telephone Encounter (Signed)
ptn reports Dr.Kessler did pap

## 2020-10-14 NOTE — Patient Instructions (Signed)
Medicare Preventive Services  Medicare coverage information Recommendation for YOU   Heart Disease and Diabetes   Lipid profile Every 5 years or more often if at risk for cardiovascular disease  Last Lipid Panel  (Last result in the past 2 years)      Cholesterol   HDL   LDL   Direct LDL   Triglycerides      02/20/20 1452 162   42   90     150           Diabetes Screening  yearly for those at risk for diabetes, 2 tests per year for those with prediabetes Last Glucose: 112    Diabetes Self Management Training or Medical Nutrition Therapy  For those with diabetes, up to 10 hrs initial training within a year, subsequent years up to 2 hrs of follow up training Optional for those with diabetes     Medical Nutrition Therapy Three hours of one-on-one counseling in first year, two hours in subsequent years Optional for those with diabetes, kidney disease   Intensive Behavioral Therapy for Obesity  Face-to-face counseling, first month every week, month 2-6 every other week, month 7-12 every month if continued progress is documented Optional for those with Body Mass Index 30 or higher  Your There is no height or weight on file to calculate BMI.   Tobacco Cessation (Quitting) Counseling   Two attempts per year, max 4 sessions per attempt, up to 8 per year, for those with tobacco-related health condition Optional for those that use tobacco   Cancer Screening   Colorectal screening   For anyone age 50 to 67 or any age if high risk:  . Screening Colonoscopy every 10 yrs if low risk,  more frequent if higher risk  OR  . Flexible  Sigmoidoscopy  every 5 yr OR  . Fecal Occult Blood Testing yearly OR  . Cologuard Stool DNA test once every 3 years OR  . CT Colonography every 5 yrs    See your schedule below   Screening Pap Test Recommended every 3 years for all women age 52 to 19, or every five years if combined with HPV test (routine screening not needed after total hysterectomy).  Medicare covers every 2 years, up to yearly if  high risk.  Screening Pelvic Exam Medicare covers every 2 years, yearly if high risk or childbearing age with abnormal Pap in last 3 yrs. See your schedule below   Screening Mammogram   Recommended every 1-2 years for women age 60 to 31, and selectively recommended for women between 3-49 based on shared decisions about risk. Covered by Medicare up to every year for women age 8 or older See your schedule below   Lung Cancer Screening  Annual low dose computed tomography (LDCT scan) is recommended for those age 20-77 who smoked 30 pack-years and are current smokers or quit smoking within past 15 years (one pack-year= smoking one PPD for one year), after counseling by your doctor or nurse clinician about the possible benefits or harms See your schedule below   Vaccinations   Pneumococcal Vaccine Recommended routinely age 73+ with two separate vaccines one year apart (Prevnar then Pneumovax).  Recommended before age 47 if medical conditions increase risk  Seasonal Influenza Vaccine Once every flu season   Hepatitis B Vaccine 3 doses if risk (including anyone with diabetes or liver disease)  Shingles Vaccine Once or twice at age 67 or older  Diphtheria Tetanus Pertussis Vaccine ONCE as adult,  booster every 10 years     Immunization History   Administered Date(s) Administered   . Covid-19 Vaccine,Pfizer-BioNTech,Purple Top,35yrs+ 09/22/2019, 10/13/2019     Shingles vaccine and Diphtheria Tetanus Pertussis vaccines are available at pharmacies or local health department without a prescription.   Other Screening   Bone Densitometry   Every 24 months for anyone at risk, including postmenopausal       Glaucoma Screening   Yearly if in high risk group such as diabetes, family history, African American age 25+ or Hispanic American age 42+      Hepatitis C Screening recommended ONCE for those born between 1945-1965, or high risk for HCV infection       HIV Testing recommended routinely at least ONCE, covered every year for age  23 to 39 regardless of risk, and every year for age over 67 who ask for the test or higher risk  Yearly or up to 3 times in pregnancy         Abdominal Aortic Aneurysm Screening Ultrasound   Once between the age of 28-75 with a family history of AAA       Your Personalized Schedule for Preventive Tests   Health Maintenance: Pending and Last Completed       Date Due Completion Date    Pneumococcal Vaccine, Age 87-64 based on risk (1 of 2 - PPSV23) Never done ---    Pap smear Never done ---    Covid-19 Vaccine (3 - Booster for Pfizer series) 04/13/2020 10/13/2019    Colonoscopy Never done ---    Diabetes A1C 08/22/2020 02/20/2020    Adult Tdap-Td (1 - Tdap) 02/27/2021 (Originally 01/08/1980) ---    Diabetic Retinal Exam 06/04/2021 06/04/2020    Mammography 07/01/2022 07/01/2020

## 2020-10-14 NOTE — Nursing Note (Signed)
10/14/20 1518   Medicare Wellness Assessment   Medicare initial or wellness physical in the last year? Yes   Advance Directives   Does patient have a living will or MPOA no   Has patient provided [employer] with a copy? YES   Advance directive information given to the patient today? YES   Activities of Daily Living   Do you need help with dressing, bathing, or walking? No   Do you need help with shopping, housekeeping, medications, or finances? No   Do you have rugs in hallways, broken steps, or poor lighting? No   Do you have grab bars in your bathroom, non-slip strips in your tub, and hand rails on your stairs? No   Cognitive Function Screen   What is you age? 1   What is the time to the nearest hour? 1   What is the year? 1   What is the name of this clinic? 1   Can the patient recognize two persons (the doctor, the nurse, home help, etc.)? 1   What is the date of your birth? (day and month sufficient)  1   In what year did World War II end? 0   Who is the current president of the [place]? 1   Count from 20 down to 1? 1   What address did I give you earlier? 1   Total Score 9   Depression Screen   Little interest or pleasure in doing things. 2   Feeling down, depressed, or hopeless 2   PHQ 2 Total 4   Trouble falling or staying asleep, or sleeping too much. 3   Feeling tired or having little energy 3   Poor appetite or overeating 3   Feeling bad about yourself/ that you are a failure in the past 2 weeks? 0   Trouble concentrating on things in the past 2 weeks? 0   Moving/Speaking slowly or being fidgety or restless  in the past 2 weeks? 0   Thoughts that you would be better off DEAD, or of hurting yourself in some way. 0   If you checked off any problems, how difficult have these problems made it for you to do your work, take care of things at home, or get along with other people? Very difficult   PHQ 9 Total 13   Hearing Screen   Have you noticed any hearing difficulties? Yes   After whispering  9-1-6 how many numbers did the patient repeat correctly? 3   Fall Risk Assessment   Do you feel unsteady when standing or walking? Yes   Do you worry about falling? No   Have you fallen in the past year? Yes   How many times have you fallen? Once   Were you ever injured from falling? Yes   Vision Screen   Right Eye = 20   (pt states she is unable due to surg to her eye lid lifted)   Left Eye = 20   (pt states she is unable due to surg to her eye lid lifted)

## 2020-10-14 NOTE — Progress Notes (Signed)
PRIMARY CARE, MEDICAL OFFICE BUILDING 5  [address]  [place] [id number]  [facility]  Medicare Annual Wellness Visit    Name: Angela Frey MRN:  [id number]   Date: 10/14/2020 Age: 60 y.o.     SUBJECTIVE:   Angela Frey is a 60 y.o. female for presenting for Medicare Wellness exam.   I have reviewed and reconciled the medication list with the patient today.    Comprehensive Health Assessment:  Patient verbal responses recorded in flowsheet     Patient presents today for Medicare wellness exam  Her difficulty is due to life and her arms and back have pain she post her to Physical have reviewed for physical therapy for her shoulder and her back.  She has a follow-up with Urology for further testing advised her to keep the appointment.  She has a follow-up with Orthopedics Dr. Deliah GoodyLegg in [place] for her shoulder issues care advised patient to keep the appointment.  She saw Dermatology and had treatment on her face for skin cancer.  She has a follow-up with Dermatology she has a place on her neck advised her let Dermatology look at it.  She wants to see a cardiologist here because she saw 1 in the play she has to live and she has high blood pressure high cholesterol and she needs to see a cardiologist.  She also wants to see lung doctor because she can not afford her lung medicines and also she reports that she needs to be on portable oxygen the little machine you carry with you.  She also has oxygen at home and needs a better oxygen machine at home.  Have referred her to pulmonology.  Patient had surgery and had her eyelids lifted recently because they were blocking her vision.  Now she has little spots around her I advised her to see the dermatologist she already has an appointment.        Comprehensive Health Assessment-Adult 02/28/2020 10/14/2020   Do you wish to complete this form? No Yes   During the past 4 weeks, how would you rate your health in general? Fair Fair   During  the past 4 weeks, how much difficulty have you had doing your usual activities inside and outside your home because of medical or emotional problems? Much difficulty Much difficulty   During the past 4 weeks, was someone available to help you if you needed and wanted help? No No   In the past year, how many times have you gone to the emergency department or been admitted to a hospital for a health problem? None None   During the past 4 weeks, how much bodily pain have you typically had? Moderate pain Severe pain   Do you currently use opioid pain medications, take regularly or keep on hand for use as needed (medication such as hydrocodone, oxycodone, codeine or morphine)? No No   Are you generally satisfied with your sleep? No No   Do you have enough money to buy things you need in everyday life, such as food, clothing, medicines, and housing? Sometimes Sometimes   Can you get to places beyond walking distance without help?  (For example, can you drive your own car or travel alone on buses)? Yes Yes   Do you fasten your seatbelt when you are in a car? Yes, usually Yes, usually   Do you exercise 20 minutes 3 or more days per week (such as walking, dancing, biking, mowing grass, swimming)? No, I  usually don't exercise this much No, I usually don't exercise this much   How often do you eat food that is healthy (fruits, vegetables, lean meats) instead of unhealthy (sweets, fast food, junk food, fatty foods)? Almost never A little bit of the time   How often do you have trouble taking medicines the eay you are told to take them? I always take them as prescribed (No Data)   Do you need any help communicating with your doctors and nurses because of vision or hearing problems? No No   During the past 12 months, have you experienced confusion or memory loss that is happening more often or is getting worse? Possibly, I am not sure No   Are you now also seeing any specialist physician(s) (such as eye doctor, foot doctor, skin  doctor)? Yes Yes   If you are seeing a specialist for anything such as foot, eye, skin, etc.  please list their name(s) Dr whittington-eye, Dr Luetta Nutting- podiatry eye, skin   How confident are you that you can control or manage most of your health problems? Very confident Very confident       I have reviewed and updated as appropriate the past medical, family and social history. 10/14/2020 as summarized below:  Past Medical History:   Diagnosis Date   . Asthma    . Diabetes (CMS HCC)    . Hypertension    . Hypothyroid      Past Surgical History:   Procedure Laterality Date   . Hx tubal ligation     . Leg surgery       Current Outpatient Medications   Medication Sig   . albuterol sulfate (PROVENTIL OR VENTOLIN OR PROAIR) 90 mcg/actuation Inhalation HFA Aerosol Inhaler Take 2 Puffs by inhalation Every 6 hours as needed   . albuterol sulfate (PROVENTIL) 2.5 mg /3 mL (0.083 %) Inhalation Solution for Nebulization Take 3 mL (2.5 mg total) by nebulization Four times a day as needed for Wheezing   . amitriptyline (ELAVIL) 25 mg Oral Tablet Take 25 mg by mouth Every night   . aspirin 81 mg Oral Tablet, Chewable Take 81 mg by mouth Once a day   . Blood Sugar Diagnostic (ACCU-CHEK GUIDE TEST STRIPS) Strip 1 Strip by Does not apply route Four times a day   . Blood-Glucose Meter Misc 4 times per day   . calcium carbonate/vitamin D3 (CALCIUM 600 + D ORAL) Take by mouth Once a day   . chlorhexidine gluconate (PERIDEX) 0.12 % Mucous Membrane Mouthwash 15 mL swish and spit Twice daily   . esomeprazole magnesium (NEXIUM) 40 mg Oral Capsule, Delayed Release(E.C.) Take 1 Capsule (40 mg total) by mouth Every morning before breakfast   . fluorouraciL (EFUDEX) 5 % Cream Apply topically Twice daily Apply to lesions as directed   . fluticasone furoate-vilanteroL (BREO ELLIPTA) 100-25 mcg/dose Inhalation Disk with Device Take 1 INHALATION by inhalation Once a day   . gabapentin (NEURONTIN) 400 mg Oral Capsule TAKE 1 CAPSULE BY MOUTH THREE TIMES  DAILY   . hydroCHLOROthiazide (HYDRODIURIL) 25 mg Oral Tablet Take 1 Tablet (25 mg total) by mouth Once a day   . ketoconazole (NIZORAL) 2 % Shampoo 3x per week 1/2 to 1 oz per shampoo   . Lactobac no.41/Bifidobact no.7 (PROBIOTIC-10 ORAL) Take by mouth Once a day   . Lancets (ACCU-CHEK SOFTCLIX LANCETS) Misc 1 Each by Does not apply route Four times a day   . levothyroxine (SYNTHROID) 175 mcg Oral Tablet  Take 1 Tablet (175 mcg total) by mouth Every morning   . lisinopriL (PRINIVIL) 20 mg Oral Tablet Take 1 tablet by mouth twice daily   . Magnesium 250 mg Oral Tablet Take by mouth Once a day   . meloxicam (MOBIC) 15 mg Oral Tablet Take 1 Tablet (15 mg total) by mouth Once per day as needed for Pain   . metFORMIN (GLUCOPHAGE) 500 mg Oral Tablet Take 1 Tablet (500 mg total) by mouth Twice daily with food   . Milk Thistle 500 mg Oral Capsule Take by mouth Twice daily   . nystatin (MYCOSTATIN) 100,000 unit/gram Cream Apply 0.5 g t.i.d. to affected area.   . nystatin (NYSTOP) 100,000 unit/gram Powder by Apply Topically route Three times a day as needed   . oxybutynin (DITROPAN) 5 mg Oral Tablet    . pediatric multivitamins Oral Tablet, Chewable Take 1 Tablet by mouth Once a day   . pravastatin (PRAVACHOL) 10 mg Oral Tablet TAKE 1 TABLET BY MOUTH ONCE DAILY IN THE EVENING   . sertraline (ZOLOFT) 100 mg Oral Tablet Take 1 Tablet (100 mg total) by mouth Once a day   . solifenacin (VESICARE) 10 mg Oral Tablet Take 10 mg by mouth   . terbinafine HCL (LAMISIL AT) 1 % Cream Apply topically Twice daily Apply to nails twice daily with a q tip. Apply to bottoms of feet and toes twice daily for 2-4 weeks or until resolution   . tiotropium bromide (SPIRIVA HANDIHALER) 18 mcg Inhalation Capsule, w/Inhalation Device Take 18 mcg by inhalation Once a day   . Urea 40 % 40 % Cream by Apply Topically route Twice daily Apply a pea sized amount with a q-tip to nails twice daily     Family Medical History:    None         Social History      Socioeconomic History   . Marital status: Unknown   Tobacco Use   . Smoking status: Former Games developer   . Smokeless tobacco: Never Used     Social Determinants of Health     Financial Resource Strain: Medium Risk   . Difficulty of Paying Living Expenses: Somewhat hard   Food Insecurity: No Food Insecurity   . Worried About Programme researcher, broadcasting/film/video in the Last Year: Never true   . Ran Out of Food in the Last Year: Never true   Transportation Needs: No Transportation Needs   . Lack of Transportation (Medical): No   . Lack of Transportation (Non-Medical): No   Physical Activity: Inactive   . Days of Exercise per Week: 0 days   . Minutes of Exercise per Session: 0 min   Stress: Stress Concern Present   . Feeling of Stress : Rather much   Intimate Partner Violence: Not At Risk   . Fear of Current or Ex-Partner: No   . Emotionally Abused: No   . Physically Abused: No   . Sexually Abused: No   Housing Stability: Low Risk    . Unable to Pay for Housing in the Last Year: No   . Number of Places Lived in the Last Year: 1   . Unstable Housing in the Last Year: No     Review of Systems: Pertinent items are noted in HPI.     List of Current Health Care Providers   Care Team     PCP     Name Type Specialty Phone Number    Itzabella Sorrels, De Nurse, DO Physician  F[facility]ILY [facility]ICINE [phone]          Care Team     No care team found                  Health Maintenance   Topic Date Due   . Pneumococcal Vaccine, Age 52-64 based on risk (1 of 2 - PPSV23) Never done   . Pap smear  Never done   . Covid-19 Vaccine (3 - Booster for Pfizer series) 04/13/2020   . Colonoscopy  Never done   . Diabetes A1C  08/22/2020   . Adult Tdap-Td (1 - Tdap) 02/27/2021 (Originally 01/08/1980)   . Diabetic Retinal Exam  06/04/2021   . Mammography  07/01/2022   . Annual Wellness Visit - Calendar Year Insurers  Completed   . Hepatitis C screening  Discontinued   . HIV Screening  Discontinued   . Influenza Vaccine  Discontinued   . Shingles Vaccine  Discontinued     Medicare  Wellness Assessment   Medicare initial or wellness physical in the last year?: Yes  Advance Directives (optional)   Does patient have a living will or MPOA: no   Has patient provided [employer] with a copy?: YES   Advance directive information given to the patient today?: YES      Activities of Daily Living   Do you need help with dressing, bathing, or walking?: No   Do you need help with shopping, housekeeping, medications, or finances?: No   Do you have rugs in hallways, broken steps, or poor lighting?: No   Do you have grab bars in your bathroom, non-slip strips in your tub, and hand rails on your stairs?: No   Urinary Incontinence Screen (Women >=65 only)       Cognitive Function Screen (1=Yes, 0=No)   What is you age?: Correct   What is the time to the nearest hour?: Correct   What is the year?: Correct   What is the name of this clinic?: Correct   Can the patient recognize two persons (the doctor, the nurse, home help, etc.)?: Correct   What is the date of your birth? (day and month sufficient) : Correct   In what year did World War II end?: Incorrect   Who is the current president of the [place]?: Correct   Count from 20 down to 1?: Correct   What address did I give you earlier?: Correct   Total Score: 9       Hearing Screen   Have you noticed any hearing difficulties?: Yes  After whispering 9-1-6 how many numbers did the patient repeat correctly?: 3   Fall Risk Screen   Do you feel unsteady when standing or walking?: Yes  Do you worry about falling?: No  Have you fallen in the past year?: Yes  How many times have you fallen?: Once  Were you ever injured from falling?: Yes   Vision Screen   Right Eye = 20:  (pt states she is unable due to surg to her eye lid lifted)   Left Eye = 20:  (pt states she is unable due to surg to her eye lid lifted)   Depression Screen   Little interest or pleasure in doing things.: More than half the days  Feeling down, depressed, or hopeless: More than half the days  PHQ  2 Total: 4  Trouble falling or staying asleep, or sleeping too much.: Nearly every day  Feeling tired or having little energy: Nearly  every day  Poor appetite or overeating: Nearly every day  Feeling bad about yourself/ that you are a failure in the past 2 weeks?: Not at all  Trouble concentrating on things in the past 2 weeks?: Not at all  Moving/Speaking slowly or being fidgety or restless  in the past 2 weeks?: Not at all  Thoughts that you would be better off DEAD, or of hurting yourself in some way.: Not at all  PHQ 9 Total: 13        OBJECTIVE:   BP 102/62   Pulse 86   Temp 36.3 C (97.3 F)   Ht 1.626 m (5\' 4" )   Wt 99 kg (218 lb 3.2 oz)   LMP  (LMP Unknown) Comment: stopped having periods at aget 44-46  SpO2 96%   Breastfeeding No   BMI 37.45 kg/m        Other appropriate exam:    Health Maintenance Due   Topic Date Due   . Pneumococcal Vaccine, Age 36-64 based on risk (1 of 2 - PPSV23) Never done   . Pap smear  Never done   . Covid-19 Vaccine (3 - Booster for Pfizer series) 04/13/2020   . Colonoscopy  Never done   . Diabetes A1C  08/22/2020      ASSESSMENT & PLAN:    Identified Risk Factors/ Recommended Actions   (Z12.11) Colon cancer screening  Plan: refer to general surgery for colonoscopy     (Z12.4) Cervical cancer screening  Plan: Dr.Kessler did pap    (Z13.1) Diabetes mellitus screening  Plan: will order      2 ICD-10-CM    1. Encounter for Medicare annual wellness exam  Z00.00 CBC/DIFF     BASIC METABOLIC PANEL, FASTING     LIPID PANEL     HGA1C (HEMOGLOBIN A1C WITH EST AVG GLUCOSE)     MICROALBUMIN/CREATININE RATIO, URINE, RANDOM   2. Colon cancer screening  Z12.11 Refer to [facility], 10[facility]20[facility]   3. Cervical cancer screening  Z12.4    4. Diabetes mellitus screening  Z13.1 CBC/DIFF     BASIC METABOLIC PANEL, FASTING     LIPID PANEL     HGA1C (HEMOGLOBIN A1C WITH EST AVG GLUCOSE)     MICROALBUMIN/CREATININE RATIO, URINE, RANDOM   5. Back pain,  unspecified back location, unspecified back pain laterality, unspecified chronicity  M54.9 Refer to [facility] PT   6. Shoulder pain, unspecified chronicity, unspecified laterality  M25.519 Refer to [facility] PT   7. Simple chronic bronchitis (CMS HCC)  J41.0 Refer to External Provider   8. Moderate major depression (CMS HCC)  F32.1 sertraline (ZOLOFT) 100 mg Oral Tablet   9. Uncontrolled type 2 diabetes mellitus with hyperglycemia (CMS HCC)  E11.65 CBC/DIFF     BASIC METABOLIC PANEL, FASTING     LIPID PANEL     HGA1C (HEMOGLOBIN A1C WITH EST AVG GLUCOSE)     MICROALBUMIN/CREATININE RATIO, URINE, RANDOM   10. Abdominal aortic atherosclerosis (CMS HCC)  I70.0 Refer to External Provider   11. Morbid obesity (CMS HCC)  E66.01    12. Essential hypertension  I10 hydroCHLOROthiazide (HYDRODIURIL) 25 mg Oral Tablet     CBC/DIFF     BASIC METABOLIC PANEL, FASTING     LIPID PANEL     HGA1C (HEMOGLOBIN A1C WITH EST AVG GLUCOSE)     MICROALBUMIN/CREATININE RATIO, URINE, RANDOM     Refer to External Provider   13. Mixed hyperlipidemia  E78.2  LIPID PANEL     Refer to External Provider   14. Mild intermittent asthma without complication  J45.20 Refer to External Provider               Orders Placed This Encounter   . CBC/DIFF   . BASIC METABOLIC PANEL, FASTING   . LIPID PANEL   . HGA1C (HEMOGLOBIN A1C WITH EST AVG GLUCOSE)   . MICROALBUMIN/CREATININE RATIO, URINE, RANDOM   . Refer to [employer] [facility], [facility]   . Refer to External Provider   . Refer to External Provider   . Refer to [facility] PT   . fluticasone furoate-vilanteroL (BREO ELLIPTA) 100-25 mcg/dose Inhalation Disk with Device   . albuterol sulfate (PROVENTIL) 2.5 mg /3 mL (0.083 %) Inhalation Solution for Nebulization   . esomeprazole magnesium (NEXIUM) 40 mg Oral Capsule, Delayed Release(E.C.)   . hydroCHLOROthiazide (HYDRODIURIL) 25 mg Oral Tablet   . sertraline (ZOLOFT) 100 mg Oral Tablet          The patient has been educated about risk  factors and recommended preventive care. Written Prevention Plan completed/ updated and given to patient (see After Visit Summary).    Medicare wellness exam today  She is seeing urology 11/01/2020  Dr.Legg for shoulder 11/11/2020  March 16th she had eye lids lifts   She saw the dermatologist for scc forehead and then she had treatment  Follow-up with dermatology as scheduled for skin conditions  Refill hydrochlorothiazide take as directed hypertension  Refill Zoloft take as directed mood anxiety depression  Refill Nexium take as directed GERD  Refill Proventil nebulizers use as directed COPD  Discontinue Symbicort  Order Prudencio Burly uses directed COPD  Refer to cardiology [facility] for evaluation hypertension hyperlipidemia aortic plaque.  Refer to pulmonology for evaluation of COPD reported shortness of breath and OSA.  Order fasting labs CBC CMP, hemoglobin A1c, urine microalbumin and lipids will call with results    A total of (39) minutes was spent on this patient encounter including review of historical information, examination, documentation and post visit activities.    The patient/care give was given ample opportunity to ask questions and those questions were answered to his/her satisfaction. A good faith effort was made to reconcile the patient's medications. The patient/caregiver was counseled on any appropriate vaccinations by the provider and questions were answered. The patient/care giver was told to contact me with any additional questions or concerns, or go to the ED in an emergency.     Return in about 6 months (around 04/15/2021) for chronic med conditions .    Bing Plume, DO  PRIMARY CARE, MEDICAL OFFICE BUILDING 5  [address]  [place] [address]  Phone: [phone]  Fax: [phone]

## 2020-10-16 ENCOUNTER — Ambulatory Visit (HOSPITAL_COMMUNITY): Payer: Self-pay

## 2020-10-22 ENCOUNTER — Ambulatory Visit
Admission: RE | Admit: 2020-10-22 | Discharge: 2020-10-22 | Disposition: A | Discharge status: Still In Hospital | Payer: Medicare PPO | Source: Ambulatory Visit | Attending: FAMILY MEDICINE | Admitting: FAMILY MEDICINE

## 2020-10-22 ENCOUNTER — Other Ambulatory Visit: Payer: Self-pay

## 2020-10-22 DIAGNOSIS — M549 Dorsalgia, unspecified: Secondary | ICD-10-CM | POA: Insufficient documentation

## 2020-10-22 DIAGNOSIS — M542 Cervicalgia: Secondary | ICD-10-CM | POA: Insufficient documentation

## 2020-10-22 DIAGNOSIS — M545 Low back pain, unspecified: Secondary | ICD-10-CM | POA: Insufficient documentation

## 2020-10-22 DIAGNOSIS — M25519 Pain in unspecified shoulder: Secondary | ICD-10-CM | POA: Insufficient documentation
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
## 2020-10-23 ENCOUNTER — Other Ambulatory Visit (HOSPITAL_COMMUNITY): Payer: Self-pay | Admitting: Neurological Surgery

## 2020-10-23 DIAGNOSIS — M542 Cervicalgia: Secondary | ICD-10-CM

## 2020-10-23 DIAGNOSIS — M545 Low back pain, unspecified: Secondary | ICD-10-CM

## 2020-10-29 ENCOUNTER — Telehealth (HOSPITAL_COMMUNITY): Payer: Self-pay | Admitting: FAMILY MEDICINE
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
## 2020-11-03 ENCOUNTER — Telehealth (INDEPENDENT_AMBULATORY_CARE_PROVIDER_SITE_OTHER): Payer: Self-pay | Admitting: FAMILY MEDICINE

## 2020-11-03 NOTE — Telephone Encounter (Signed)
Patient called in and stated she was referred to Dr. Mendel Corning for a colonoscopy, but she does not want to see him, she wants to be referred to Simmie Davies at [facility].      Thanks    [name], Unit Clerk  11/03/2020, 11:23

## 2020-11-04 ENCOUNTER — Encounter (INDEPENDENT_AMBULATORY_CARE_PROVIDER_SITE_OTHER): Payer: Self-pay | Admitting: Surgery

## 2020-11-05 ENCOUNTER — Other Ambulatory Visit (INDEPENDENT_AMBULATORY_CARE_PROVIDER_SITE_OTHER): Payer: Self-pay | Admitting: FAMILY MEDICINE

## 2020-11-05 DIAGNOSIS — Z1211 Encounter for screening for malignant neoplasm of colon: Secondary | ICD-10-CM

## 2020-11-05 NOTE — Telephone Encounter (Signed)
Please advise patient have referred her to Simmie Davies for her screening colonoscopy as she has requested.

## 2020-11-05 NOTE — Telephone Encounter (Signed)
Called informed pt of mentioned.

## 2020-11-06 ENCOUNTER — Ambulatory Visit (HOSPITAL_COMMUNITY): Payer: Self-pay | Admitting: Anesthesiology

## 2020-11-10 ENCOUNTER — Ambulatory Visit (INDEPENDENT_AMBULATORY_CARE_PROVIDER_SITE_OTHER): Payer: Self-pay | Admitting: Surgery

## 2020-11-12 ENCOUNTER — Other Ambulatory Visit (HOSPITAL_COMMUNITY): Payer: Self-pay | Admitting: Orthopaedic Surgery

## 2020-11-12 DIAGNOSIS — M25511 Pain in right shoulder: Secondary | ICD-10-CM

## 2020-11-15 ENCOUNTER — Other Ambulatory Visit: Payer: Self-pay

## 2020-11-15 ENCOUNTER — Ambulatory Visit
Admission: RE | Admit: 2020-11-15 | Discharge: 2020-11-15 | Disposition: A | Discharge status: Home / Self Care | Payer: Medicare PPO | Source: Ambulatory Visit | Attending: Orthopaedic Surgery | Admitting: Orthopaedic Surgery

## 2020-11-15 DIAGNOSIS — M62511 Muscle wasting and atrophy, not elsewhere classified, right shoulder: Secondary | ICD-10-CM

## 2020-11-15 DIAGNOSIS — M25511 Pain in right shoulder: Secondary | ICD-10-CM | POA: Insufficient documentation

## 2020-11-15 DIAGNOSIS — M25411 Effusion, right shoulder: Secondary | ICD-10-CM

## 2020-11-15 DIAGNOSIS — M67813 Other specified disorders of tendon, right shoulder: Secondary | ICD-10-CM

## 2020-11-15 DIAGNOSIS — S46011A Strain of muscle(s) and tendon(s) of the rotator cuff of right shoulder, initial encounter: Secondary | ICD-10-CM

## 2020-11-15 DIAGNOSIS — S43431A Superior glenoid labrum lesion of right shoulder, initial encounter: Secondary | ICD-10-CM

## 2020-11-15 DIAGNOSIS — S46111A Strain of muscle, fascia and tendon of long head of biceps, right arm, initial encounter: Secondary | ICD-10-CM

## 2020-11-15 DIAGNOSIS — M19011 Primary osteoarthritis, right shoulder: Secondary | ICD-10-CM

## 2020-11-25 ENCOUNTER — Telehealth: Payer: Self-pay

## 2020-11-25 ENCOUNTER — Telehealth (INDEPENDENT_AMBULATORY_CARE_PROVIDER_SITE_OTHER): Payer: Self-pay | Admitting: FAMILY MEDICINE

## 2020-11-25 NOTE — Telephone Encounter (Signed)
Larene called in requesting a new referral to urology,  She wants to see Dr. Overton Mam in [place].      Thanks    [name], Unit Clerk  11/25/2020, 16:54

## 2020-11-25 NOTE — Telephone Encounter (Signed)
Copied from [place] [phone]. Topic: General - Other >> Nov 24, 2020  2:59 PM Keene Breath wrote: Reason for CRM: Patient called to ask the nurse to call her regarding an Telehealth appt. She had in 2021.  She stated she wanted a copy of the notes for that appt.  Please advise and call patient to discuss at [phone]   I called the patient but there was no answer. I left a message to call me back.

## 2020-11-26 ENCOUNTER — Other Ambulatory Visit (INDEPENDENT_AMBULATORY_CARE_PROVIDER_SITE_OTHER): Payer: Self-pay | Admitting: FAMILY MEDICINE

## 2020-11-26 DIAGNOSIS — N3946 Mixed incontinence: Secondary | ICD-10-CM

## 2020-11-26 NOTE — Telephone Encounter (Signed)
Please advise ptn referral has been submitted to Dr.Deem.

## 2020-11-26 NOTE — Telephone Encounter (Signed)
LM on VM and gave Mendy the referral person phone number on VM.

## 2020-12-02 ENCOUNTER — Other Ambulatory Visit (INDEPENDENT_AMBULATORY_CARE_PROVIDER_SITE_OTHER): Payer: Self-pay | Admitting: FAMILY MEDICINE

## 2020-12-02 DIAGNOSIS — E063 Autoimmune thyroiditis: Secondary | ICD-10-CM

## 2020-12-02 DIAGNOSIS — F321 Major depressive disorder, single episode, moderate: Secondary | ICD-10-CM

## 2020-12-02 DIAGNOSIS — E038 Other specified hypothyroidism: Secondary | ICD-10-CM

## 2020-12-03 NOTE — Telephone Encounter (Signed)
Refill Request    Last Appt.       Future Appt.       10/14/2020 none

## 2020-12-15 ENCOUNTER — Other Ambulatory Visit (INDEPENDENT_AMBULATORY_CARE_PROVIDER_SITE_OTHER): Payer: Self-pay | Admitting: FAMILY MEDICINE

## 2020-12-15 DIAGNOSIS — I10 Essential (primary) hypertension: Secondary | ICD-10-CM

## 2020-12-16 NOTE — Telephone Encounter (Signed)
Refill Request    Last Appt.       Future Appt.       10/14/2020 none

## 2020-12-23 ENCOUNTER — Telehealth: Payer: Self-pay

## 2020-12-23 NOTE — Telephone Encounter (Signed)
Copied from [place] [phone]. Topic: General - Other >> Dec 19, 2020 12:19 PM Valere Dross wrote: Reason for CRM: Pt called in about getting some information about a Colonoscopy she had done in 2019, pt did state that the date doesn't seems right, pt request someone reach out to here to speak about it. Please advise    I called the pt and was sent straight to VM. I left her a message saying that she would need to call [facility] for related issues to a colonoscopy because they would need to be the office to help her.

## 2021-01-09 DIAGNOSIS — M75120 Complete rotator cuff tear or rupture of unspecified shoulder, not specified as traumatic: Secondary | ICD-10-CM | POA: Insufficient documentation

## 2021-01-09 DIAGNOSIS — S46119A Strain of muscle, fascia and tendon of long head of biceps, unspecified arm, initial encounter: Secondary | ICD-10-CM | POA: Insufficient documentation

## 2021-01-16 ENCOUNTER — Ambulatory Visit: Payer: Medicare PPO | Attending: Obstetrics & Gynecology | Admitting: Obstetrics & Gynecology

## 2021-01-16 ENCOUNTER — Ambulatory Visit (HOSPITAL_COMMUNITY): Payer: Medicare PPO

## 2021-01-16 ENCOUNTER — Other Ambulatory Visit: Payer: Self-pay

## 2021-01-16 ENCOUNTER — Encounter (HOSPITAL_BASED_OUTPATIENT_CLINIC_OR_DEPARTMENT_OTHER): Payer: Self-pay | Admitting: Obstetrics & Gynecology

## 2021-01-16 VITALS — BP 156/100 | HR 90 | Temp 97.2°F | Resp 18 | Ht 64.0 in | Wt 216.0 lb

## 2021-01-16 DIAGNOSIS — N939 Abnormal uterine and vaginal bleeding, unspecified: Secondary | ICD-10-CM

## 2021-01-16 DIAGNOSIS — R102 Pelvic and perineal pain: Secondary | ICD-10-CM

## 2021-01-16 DIAGNOSIS — N950 Postmenopausal bleeding: Secondary | ICD-10-CM | POA: Insufficient documentation

## 2021-01-16 LAB — URINALYSIS, MACROSCOPIC
BILIRUBIN: NEGATIVE mg/dL
GLUCOSE: NEGATIVE mg/dL
KETONES: NEGATIVE mg/dL
NITRITE: NEGATIVE
PH: 8 (ref 4.6–8.0)
PROTEIN: NEGATIVE mg/dL
SPECIFIC GRAVITY: 1.015 (ref 1.005–1.030)
UROBILINOGEN: 0.2 mg/dL (ref 0.2–1.0)

## 2021-01-16 LAB — URINALYSIS, MICROSCOPIC

## 2021-01-16 NOTE — Nursing Note (Signed)
Travel Screening     Question   Response    In the last 10 days, have you been in contact with someone who was confirmed or suspected to have Coronavirus/COVID-19?  No / Unsure    Have you had a COVID-19 viral test in the last 10 days?  No    Do you have any of the following new or worsening symptoms?  None of these    Have you traveled internationally or domestically in the last month?  No      Travel History   Travel since 12/17/20    No documented travel since 12/17/20       Body mass index is 37.08 kg/m.     Last Pap Test: 9/222/21   Location: Dr. Lynett Fish)  Have you ever had an abnormal pap smear: No

## 2021-01-16 NOTE — Progress Notes (Signed)
[facility]  OB/GYN, [facility]  [address]  [place] [address]  [phone]    [name] 01/16/2021   [id number] 08/11/1960     Chief Complaint: Vaginal Bleeding (Started bleeding on Wednesday.)      Subjective: Angela Frey is a 60 y.o. female presenting for evaluation of PMB  onsert Wednesday am with light bright red spotting. Thursday there was increase in volume and some clots. Pt denies intercourse, pain or trauma.     Medical History   I have reviewed and updated as appropriate the past medical, family and social history today:    Medical History/Surgical History/Family History  Past Medical History:   Diagnosis Date   . Asthma    . Diabetes (CMS HCC)    . Hypertension    . Hypothyroid           Past Surgical History:   Procedure Laterality Date   . HX TUBAL LIGATION     . LEG SURGERY            Family Medical History:    None          Menstrual History   . Total time span for birth control pills     . Use of HRT (how long total use)     . Use of Infertility Treatments     . Age of Menarche     . Avg. frequency (days)     . Duration (days)     . Flow     . Pattern     . Age of Menopause         OB History   No obstetric history on file.     Social History     Socioeconomic History   . Marital status: Unknown   Tobacco Use   . Smoking status: Former Games developer   . Smokeless tobacco: Never Used     Social Determinants of Health     Financial Resource Strain: Medium Risk   . Difficulty of Paying Living Expenses: Somewhat hard   Food Insecurity: No Food Insecurity   . Worried About Programme researcher, broadcasting/film/video in the Last Year: Never true   . Ran Out of Food in the Last Year: Never true   Transportation Needs: No Transportation Needs   . Lack of Transportation (Medical): No   . Lack of Transportation (Non-Medical): No   Physical Activity: Inactive   . Days of Exercise per Week: 0 days   . Minutes of Exercise per Session: 0 min   Stress: Stress Concern Present   . Feeling of Stress :  Rather much   Intimate Partner Violence: Not At Risk   . Fear of Current or Ex-Partner: No   . Emotionally Abused: No   . Physically Abused: No   . Sexually Abused: No   Housing Stability: Low Risk    . Unable to Pay for Housing in the Last Year: No   . Number of Places Lived in the Last Year: 1   . Unstable Housing in the Last Year: No     Expanded Substance History       Allergies:  Allergies   Allergen Reactions   . Clindamycin Phosphate    . Cleocin [Clindamycin] Hives/ Urticaria   . Doxylamin-Pse-Dm-Acetaminophen Hives/ Urticaria and  Other Adverse Reaction (Add comment)   . Tessalon [Benzonatate] Hives/ Urticaria     Medications:  Current Outpatient Medications   Medication Sig   .  albuterol sulfate (PROVENTIL OR VENTOLIN OR PROAIR) 90 mcg/actuation Inhalation HFA Aerosol Inhaler Take 2 Puffs by inhalation Every 6 hours as needed   . albuterol sulfate (PROVENTIL) 2.5 mg /3 mL (0.083 %) Inhalation Solution for Nebulization Take 3 mL (2.5 mg total) by nebulization Four times a day as needed for Wheezing   . amitriptyline (ELAVIL) 25 mg Oral Tablet Take 25 mg by mouth Every night   . aspirin 81 mg Oral Tablet, Chewable Take 81 mg by mouth Once a day   . Blood Sugar Diagnostic (ACCU-CHEK GUIDE TEST STRIPS) Strip 1 Strip by Does not apply route Four times a day   . Blood-Glucose Meter Misc 4 times per day   . calcium carbonate/vitamin D3 (CALCIUM 600 + D ORAL) Take by mouth Once a day   . chlorhexidine gluconate (PERIDEX) 0.12 % Mucous Membrane Mouthwash 15 mL swish and spit Twice daily   . esomeprazole magnesium (NEXIUM) 40 mg Oral Capsule, Delayed Release(E.C.) Take 1 Capsule (40 mg total) by mouth Every morning before breakfast   . fluorouraciL (EFUDEX) 5 % Cream Apply topically Twice daily Apply to lesions as directed   . fluticasone furoate-vilanteroL (BREO ELLIPTA) 100-25 mcg/dose Inhalation Disk with Device Take 1 INHALATION by inhalation Once a day   . gabapentin (NEURONTIN) 400 mg Oral Capsule TAKE 1  CAPSULE BY MOUTH THREE TIMES DAILY   . hydroCHLOROthiazide (HYDRODIURIL) 25 mg Oral Tablet Take 1 Tablet (25 mg total) by mouth Once a day   . ketoconazole (NIZORAL) 2 % Shampoo 3x per week 1/2 to 1 oz per shampoo   . Lactobac no.41/Bifidobact no.7 (PROBIOTIC-10 ORAL) Take by mouth Once a day   . Lancets (ACCU-CHEK SOFTCLIX LANCETS) Misc 1 Each by Does not apply route Four times a day   . levothyroxine (SYNTHROID) 175 mcg Oral Tablet TAKE 1 TABLET BY MOUTH IN THE MORNING   . lisinopriL (PRINIVIL) 20 mg Oral Tablet Take 1 tablet by mouth twice daily   . Magnesium 250 mg Oral Tablet Take by mouth Once a day   . meloxicam (MOBIC) 15 mg Oral Tablet Take 1 Tablet (15 mg total) by mouth Once per day as needed for Pain   . metFORMIN (GLUCOPHAGE) 500 mg Oral Tablet Take 1 Tablet (500 mg total) by mouth Twice daily with food   . Milk Thistle 500 mg Oral Capsule Take by mouth Twice daily   . nystatin (MYCOSTATIN) 100,000 unit/gram Cream Apply 0.5 g t.i.d. to affected area.   . nystatin (NYSTOP) 100,000 unit/gram Powder by Apply Topically route Three times a day as needed   . oxybutynin (DITROPAN) 5 mg Oral Tablet    . pediatric multivitamins Oral Tablet, Chewable Take 1 Tablet by mouth Once a day   . pravastatin (PRAVACHOL) 10 mg Oral Tablet TAKE 1 TABLET BY MOUTH ONCE DAILY IN THE EVENING   . sertraline (ZOLOFT) 100 mg Oral Tablet Take 1 Tablet (100 mg total) by mouth Once a day   . solifenacin (VESICARE) 10 mg Oral Tablet Take 10 mg by mouth   . terbinafine HCL (LAMISIL AT) 1 % Cream Apply topically Twice daily Apply to nails twice daily with a q tip. Apply to bottoms of feet and toes twice daily for 2-4 weeks or until resolution   . tiotropium bromide (SPIRIVA HANDIHALER) 18 mcg Inhalation Capsule, w/Inhalation Device Take 18 mcg by inhalation Once a day   . trimethoprim-sulfamethoxazole (BACTRIM DS) 160-800mg  per tablet Take 1 Tablet (160 mg total) by mouth Twice  daily   . Urea 40 % 40 % Cream by Apply Topically route  Twice daily Apply a pea sized amount with a q-tip to nails twice daily     Immunization History:  Immunization History   Administered Date(s) Administered   . Covid-19 Vaccine,Pfizer-BioNTech,Purple Top,53yrs+ 09/22/2019, 10/13/2019     Problem List:  Patient Active Problem List    Diagnosis Date Noted   . Abdominal aortic atherosclerosis (CMS HCC) 07/09/2020   . Mixed stress and urge urinary incontinence 02/28/2020   . Essential hypertension 02/20/2020   . Hypothyroidism due to Hashimoto's thyroiditis 02/20/2020   . Morbid obesity (CMS HCC) 02/20/2020   . Uncontrolled type 2 diabetes mellitus with hyperglycemia (CMS HCC) 02/20/2020   . Mild intermittent asthma without complication 02/20/2020   . Simple chronic bronchitis (CMS HCC) 02/20/2020   . Moderate major depression (CMS HCC) 02/20/2020   . Gastroesophageal reflux disease without esophagitis 02/20/2020   . Seborrheic dermatitis of scalp 02/20/2020   . Mixed hyperlipidemia 02/20/2020   . Primary insomnia 02/20/2020       Review of Systems:  Patient was asked about all of the following:  Constitutional: Weakness, fatigue, fever, chills, weight loss, weight gain, daytime sleeping.  Eyes: Glasses, blurred vision, double vision, burning, cataracts, vision changes, problems with bright lights, itching.  Ears, nose, mouth, throat, and face: Earache or drainage, hearing loss, hoarseness, nose bleeds, sinus problems, sore throat, ringing in the ears, nasal congestion.  Respiratory:  Shortness of breath, cough, coughing up blood, recent lung infection, snoring, wheezing, home breathing treatment.  Cardiovascular:  Chest pain, swelling legs, heart trouble, palpitations, leg cramps, SOB with exertion, murmur.  Breast: No new breast lumps or masses  Gastrointestinal: Abdominal pain, black stool, constipation, diarrhea, heartburn, nausea, vomiting, swallowing problems.  Genitourinary:  Blood in urine, painful urination, increased frequency, leaking urine, recurrent UTI's,  urination at night, painful periods, straining.  Integument/breast:  Rash, itching, color change of lesion, change in size of lesion, bleeding ulcer/lesion, new lesion, delayed healing.  Hematologic/lymphatic: Anemia, blood clots, easy bruising, hepatitis exposure, transfusions, swollen lymph nodes, bleeding problems.  Musculoskeletal: Back pain, joint pain, difficulty walking, cramps, trauma, weakness, joint swelling.  Neurological: Dizziness, headache, numbness, paralysis, seizures, stroke, memory loss.  Behavioral/Psych: Suicidal ideations, homicidal ideations, anxiety, depression, confusion, sleeping problems, stress, panic attacks, hallucinations.  Endocrine: Dry skin, excess thirst, hair loss, heat intolerance, cold intolerance, excess hair growth.    ROS neg except as mentioned in CC.    Objective   Vitals:  BP (!) 156/100   Pulse 90   Temp 36.2 C (97.2 F)   Resp 18   Ht 1.626 m (5\' 4" )   Wt 98 kg (216 lb)   LMP  (LMP Unknown) Comment: stopped having periods at aget 44-46  SpO2 94%   BMI 37.08 kg/m       Height: 162.6 cm (5\' 4" )  Wt Readings from Last 5 Encounters:   01/16/21 98 kg (216 lb)   10/14/20 99 kg (218 lb 3.2 oz)   07/09/20 99.3 kg (219 lb)   05/30/20 99.3 kg (219 lb)   04/02/20 99.4 kg (219 lb 3.2 oz)     Body mass index is 37.08 kg/m.    Physical Exam:  General Appearance: appears in good health, moderately obese, appears stated age, no distress and vital signs reviewed  Head: NCAT  Eyes: lids and lashes normal, conjunctivae and sclerae normal and pupils equal, EOM intact  Neck: No lymphadenopathy. No thyromegaly or  thyroid mass.  Cardiovascular: Regular rate and rhythm with no murmurs, gallops or rubs.  Pulmonary: Lungs clear to auscultation in all fields bilaterally. Good air movement.  Abdomen: Soft, non-tender, non-distended, no massses or organomegaly  Extremities: No clubbing, cyanosis, or edema. and  No joint effusion.  Skin: Skin warm and dry and No rashes  Musculoskeletal:  inspection is normal  Lymphatics: No lymphadenopathy of cervical, axillary or inguinal lymph nodes  Psychiatric: Normal affect, behavior, memory, thought content, judgement, and speech.  Neurology: normal without focal findings  mental status, speech normal, alert and oriented x iii      Pelvic: Vulva without lesions, pink, NT   Urethra without lesions, NT   Bladder tender  Vaginal pink, moist, no lesions, NT   Cervix smooth, no lesions, no d/c, NT, pink   Uterus normal size, mobile, nontender   Adenexa normal size, NT, no masses  Anus without lesions, good tone  Rectal: Deferred.           Laboratory Studies/Data Reviewed   I have reviewed all available and pertinent laboratory studies, imaging and health maintenance  Health Maintenance   Topic Date Due   . Pneumococcal Vaccine, Age 15-64 (1 - PCV) Never done   . Pap smear  Never done   . Covid-19 Vaccine (3 - Booster for Pfizer series) 03/14/2020   . Colonoscopy  Never done   . Diabetes A1C  08/22/2020   . Adult Tdap-Td (1 - Tdap) 02/27/2021 (Originally [birth year]/1980)   . Diabetic Retinal Exam  03/14/2021   . Mammography  07/01/2022   . Annual Wellness Visit - Calendar Year Insurers  Completed   . Meningococcal Vaccine  Aged Out   . Hepatitis C screening  Discontinued   . HIV Screening  Discontinued   . Influenza Vaccine  Discontinued   . Shingles Vaccine  Discontinued     Nursing Notes:   Alfred Levins, LPN  16/10/96 0454  Signed   Travel Screening     Question   Response    In the last 10 days, have you been in contact with someone who was confirmed or suspected to have Coronavirus/COVID-19?  No / Unsure    Have you had a COVID-19 viral test in the last 10 days?  No    Do you have any of the following new or worsening symptoms?  None of these    Have you traveled internationally or domestically in the last month?  No      Travel History   Travel since 12/17/20    No documented travel since 12/17/20       Body mass index is 37.08 kg/m.     Last Pap Test: 9/222/21    Location: Dr. Lynett Fish)  Have you ever had an abnormal pap smear: No           I have reviewed all health maintenance items with the patient.  Assessment/Plan   Diagnosis    ICD-10-CM    1. Abnormal vaginal bleeding  N93.9 SURESWAB(R), MYCOPLASMA HOMINIS, REAL-TIME PCR     SURESWAB(R), UREAPLASMA SPECIES, REAL TIME PCR     MYCOPLASMA GENITALIUM RNA, NAAT   2. Pelvic pressure in female  R10.2 URINALYSIS, MACROSCOPIC AND MICROSCOPIC W/CULTURE REFLEX   3. PMB (postmenopausal bleeding)  N95.0 US PELVIS NON OB TRANSVAGINAL       Encounter Medications and Orders  Orders Placed This Encounter   . MYCOPLASMA GENITALIUM RNA, NAAT   . US PELVIS NON OB TRANSVAGINAL   .  SURESWAB(R), MYCOPLASMA HOMINIS, REAL-TIME PCR   . SURESWAB(R), UREAPLASMA SPECIES, REAL TIME PCR   . URINALYSIS, MACROSCOPIC AND MICROSCOPIC W/CULTURE REFLEX         Plan  UA w reflex culture  TVUS ordered  Urea/myco testing  Plan for endometrial sampling      Return in about 4 weeks (around 02/13/2021).    Seward MethMichaela E Shelby Peltz, DO

## 2021-01-17 LAB — URINE CULTURE,ROUTINE: URINE CULTURE: >100000 — AB

## 2021-01-21 ENCOUNTER — Encounter: Payer: Self-pay | Admitting: *Deleted

## 2021-01-22 ENCOUNTER — Telehealth (HOSPITAL_BASED_OUTPATIENT_CLINIC_OR_DEPARTMENT_OTHER): Payer: Self-pay | Admitting: Obstetrics & Gynecology

## 2021-01-22 LAB — SURESWAB(R), UREAPLASMA SPECIES, REAL TIME PCR
U. PARVUM DNA: NOT DETECTED
U. UREALYTICUM DNA: NOT DETECTED

## 2021-01-22 LAB — SURESWAB(R), MYCOPLASMA HOMINIS, REAL-TIME PCR: SURESWAB,M.HOMINIS,RT PCR: NOT DETECTED

## 2021-01-22 MED ORDER — SULFAMETHOXAZOLE 800 MG-TRIMETHOPRIM 160 MG TABLET
1.0000 | ORAL_TABLET | Freq: Two times a day (BID) | ORAL | 0 refills | Status: DC
Start: 2021-01-22 — End: 2021-04-01

## 2021-01-22 NOTE — Telephone Encounter (Signed)
Pt has UTI. Rx sent. Sorry for the wait. Take at a separate time from her lisinopril. mk

## 2021-01-23 ENCOUNTER — Ambulatory Visit (HOSPITAL_COMMUNITY): Payer: Self-pay

## 2021-01-24 LAB — MYCOPLASMA GENITALIUM RNA, NAAT: MYCOPLASMA GENITALIUM: NEGATIVE

## 2021-01-26 ENCOUNTER — Other Ambulatory Visit: Payer: Self-pay

## 2021-01-26 ENCOUNTER — Inpatient Hospital Stay
Admission: RE | Admit: 2021-01-26 | Discharge: 2021-01-26 | Disposition: A | Discharge status: Home / Self Care | Payer: Medicare PPO | Source: Ambulatory Visit | Attending: Obstetrics & Gynecology | Admitting: Obstetrics & Gynecology

## 2021-01-26 DIAGNOSIS — N950 Postmenopausal bleeding: Secondary | ICD-10-CM | POA: Insufficient documentation

## 2021-01-27 ENCOUNTER — Ambulatory Visit (HOSPITAL_COMMUNITY)
Admission: RE | Admit: 2021-01-27 | Discharge: 2021-01-27 | Disposition: A | Discharge status: Home / Self Care | Payer: Medicare PPO | Source: Ambulatory Visit | Attending: FAMILY MEDICINE | Admitting: FAMILY MEDICINE

## 2021-01-27 ENCOUNTER — Other Ambulatory Visit: Payer: Self-pay

## 2021-01-27 ENCOUNTER — Encounter (INDEPENDENT_AMBULATORY_CARE_PROVIDER_SITE_OTHER): Payer: Self-pay | Admitting: FAMILY MEDICINE

## 2021-01-27 ENCOUNTER — Ambulatory Visit (HOSPITAL_COMMUNITY): Payer: Medicare PPO

## 2021-01-27 ENCOUNTER — Ambulatory Visit: Payer: Medicare PPO | Attending: FAMILY MEDICINE | Admitting: FAMILY MEDICINE

## 2021-01-27 ENCOUNTER — Telehealth (INDEPENDENT_AMBULATORY_CARE_PROVIDER_SITE_OTHER): Payer: Self-pay | Admitting: FAMILY MEDICINE

## 2021-01-27 ENCOUNTER — Other Ambulatory Visit (INDEPENDENT_AMBULATORY_CARE_PROVIDER_SITE_OTHER): Payer: Self-pay | Admitting: FAMILY MEDICINE

## 2021-01-27 VITALS — BP 138/80 | HR 100 | Temp 97.3°F | Resp 18 | Ht 64.0 in | Wt 219.0 lb

## 2021-01-27 DIAGNOSIS — E782 Mixed hyperlipidemia: Secondary | ICD-10-CM

## 2021-01-27 DIAGNOSIS — I10 Essential (primary) hypertension: Secondary | ICD-10-CM

## 2021-01-27 DIAGNOSIS — E1165 Type 2 diabetes mellitus with hyperglycemia: Secondary | ICD-10-CM | POA: Insufficient documentation

## 2021-01-27 DIAGNOSIS — R000 Tachycardia, unspecified: Secondary | ICD-10-CM

## 2021-01-27 DIAGNOSIS — M545 Low back pain, unspecified: Secondary | ICD-10-CM | POA: Insufficient documentation

## 2021-01-27 DIAGNOSIS — E038 Other specified hypothyroidism: Secondary | ICD-10-CM | POA: Insufficient documentation

## 2021-01-27 DIAGNOSIS — E063 Autoimmune thyroiditis: Secondary | ICD-10-CM | POA: Insufficient documentation

## 2021-01-27 DIAGNOSIS — R0981 Nasal congestion: Secondary | ICD-10-CM | POA: Insufficient documentation

## 2021-01-27 DIAGNOSIS — I700 Atherosclerosis of aorta: Secondary | ICD-10-CM | POA: Insufficient documentation

## 2021-01-27 DIAGNOSIS — J410 Simple chronic bronchitis: Secondary | ICD-10-CM | POA: Insufficient documentation

## 2021-01-27 DIAGNOSIS — R059 Cough, unspecified: Secondary | ICD-10-CM | POA: Insufficient documentation

## 2021-01-27 LAB — ECG 12 LEAD
Atrial Rate: 86 {beats}/min
Calculated P Axis: 70 degrees
Calculated R Axis: 15 degrees
Calculated T Axis: 57 degrees
PR Interval: 144 ms
QRS Duration: 90 ms
QT Interval: 384 ms
QTC Calculation: 459 ms
Ventricular rate: 86 {beats}/min

## 2021-01-27 LAB — COMPREHENSIVE METABOLIC PANEL, NON-FASTING
ALBUMIN/GLOBULIN RATIO: 1.5 (ref 1.0–2.0)
ALBUMIN: 4 g/dL (ref 3.5–5.2)
ALKALINE PHOSPHATASE: 94 U/L (ref 35–104)
ALT (SGPT): 26 U/L (ref 0–33)
ANION GAP: 10 mmol/L (ref 10–21)
AST (SGOT): 43 U/L — ABNORMAL HIGH (ref 0–32)
BILIRUBIN TOTAL: 0.3 mg/dL (ref 0.0–1.0)
BUN/CREA RATIO: 23
BUN: 20 mg/dL (ref 6–20)
CALCIUM: 10.2 mg/dL (ref 8.4–10.5)
CHLORIDE: 105 mmol/L (ref 96–107)
CO2 TOTAL: 23 mmol/L (ref 22–29)
CREATININE: 0.87 mg/dL (ref 0.50–0.90)
ESTIMATED GFR: >60 mL/min/{1.73_m2} (ref >60)
GLUCOSE: 144 mg/dL — ABNORMAL HIGH (ref 74–106)
POTASSIUM: 5.2 mmol/L — ABNORMAL HIGH (ref 3.5–5.1)
PROTEIN TOTAL: 6.7 g/dL (ref 6.6–8.7)
SODIUM: 138 mmol/L (ref 136–145)

## 2021-01-27 LAB — CBC WITH DIFF
BASOPHIL #: <0.1 10*3/uL (ref <=0.20)
BASOPHIL %: 1 %
EOSINOPHIL #: 0.34 10*3/uL (ref <=0.50)
EOSINOPHIL %: 6 %
HCT: 30.2 % — ABNORMAL LOW (ref 34.8–46.0)
HGB: 9.2 g/dL — ABNORMAL LOW (ref 11.5–16.0)
IMMATURE GRANULOCYTE #: <0.1 10*3/uL (ref <0.10)
IMMATURE GRANULOCYTE %: 0 % (ref 0–1)
LYMPHOCYTE #: 1.41 10*3/uL (ref 1.00–4.80)
LYMPHOCYTE %: 27 %
MCH: 26.1 pg (ref 26.0–32.0)
MCHC: 30.5 g/dL — ABNORMAL LOW (ref 31.0–35.5)
MCV: 85.6 fL (ref 78.0–100.0)
MONOCYTE #: 0.45 10*3/uL (ref 0.20–1.10)
MONOCYTE %: 9 %
MPV: 11.1 fL (ref 8.7–12.5)
NEUTROPHIL #: 3.04 10*3/uL (ref 1.50–7.70)
NEUTROPHIL %: 57 %
PLATELETS: 123 10*3/uL — ABNORMAL LOW (ref 150–400)
RBC: 3.53 10*6/uL — ABNORMAL LOW (ref 3.85–5.22)
RDW-CV: 15.8 % — ABNORMAL HIGH (ref 11.5–15.5)
WBC: 5.3 10*3/uL (ref 3.7–11.0)

## 2021-01-27 LAB — MICROALBUMIN/CREATININE RATIO, URINE, RANDOM
CREATININE RANDOM URINE: 22 mg/dL — ABNORMAL LOW (ref 30–125)
MICROALBUMIN RANDOM URINE: <1.3 mg/dL (ref 0.0–2.0)

## 2021-01-27 LAB — LIPID PANEL
CHOLESTEROL: 153 mg/dL (ref 100–170)
HDL CHOL: 48 mg/dL (ref 30–65)
LDL CALC: 79 mg/dL (ref 0–130)
TRIGLYCERIDES: 128 mg/dL (ref 0–200)
VLDL CALC: 26 mg/dL (ref 5–40)

## 2021-01-27 LAB — THYROID STIMULATING HORMONE (SENSITIVE TSH): TSH: 0.566 u[IU]/mL (ref 0.270–4.200)

## 2021-01-27 LAB — HGA1C (HEMOGLOBIN A1C WITH EST AVG GLUCOSE)
ESTIMATED AVERAGE GLUCOSE: 137 mg/dL
HEMOGLOBIN A1C: 6.4 % — ABNORMAL HIGH (ref 4.8–5.9)

## 2021-01-27 MED ORDER — GUAIFENESIN ER 600 MG TABLET, EXTENDED RELEASE 12 HR
600.0000 mg | EXTENDED_RELEASE_TABLET | Freq: Two times a day (BID) | ORAL | 0 refills | Status: DC
Start: 2021-01-27 — End: 2021-10-21

## 2021-01-27 MED ORDER — MONTELUKAST 10 MG TABLET
10.0000 mg | ORAL_TABLET | Freq: Every evening | ORAL | 1 refills | Status: DC
Start: 2021-01-27 — End: 2021-07-01

## 2021-01-27 MED ORDER — TIOTROPIUM BROMIDE 18 MCG CAPSULE WITH INHALATION DEVICE
18.0000 ug | ORAL_CAPSULE | Freq: Every day | RESPIRATORY_TRACT | 5 refills | Status: DC
Start: 2021-01-27 — End: 2021-07-01

## 2021-01-27 MED ORDER — LISINOPRIL 20 MG TABLET
20.0000 mg | ORAL_TABLET | Freq: Two times a day (BID) | ORAL | 1 refills | Status: DC
Start: 2021-01-27 — End: 2021-07-01

## 2021-01-27 MED ORDER — MELOXICAM 15 MG TABLET
15.0000 mg | ORAL_TABLET | Freq: Every day | ORAL | 1 refills | Status: DC | PRN
Start: 2021-01-27 — End: 2021-07-01

## 2021-01-27 NOTE — Nursing Note (Signed)
Travel Screening     Question   Response    In the last 10 days, have you been in contact with someone who was confirmed or suspected to have Coronavirus/COVID-19?  No / Unsure    Have you had a COVID-19 viral test in the last 10 days?  No    Do you have any of the following new or worsening symptoms?  None of these    Have you traveled internationally or domestically in the last month?  No      Travel History   Travel since 12/28/20    No documented travel since 12/28/20           Body mass index is 37.59 kg/m.      PHQ Questionnaire  Little interest or pleasure in doing things.: Several Days  Feeling down, depressed, or hopeless: Several Days  PHQ 2 Total: 2        Fall Risk Assessment  Do you feel unsteady when standing or walking?: Yes  Do you worry about falling?: Yes  Have you fallen in the past year?: Yes  How many times have you fallen?: Once  Were you ever injured from falling?: No

## 2021-01-27 NOTE — Telephone Encounter (Signed)
Patient called in and stated that she wanted to be referred to Dr Haig Prophet or Salvatore Decent for cardiology.   She also wanted to let you know that the EKG is completed.      [name], Unit Clerk  01/27/2021, 13:38

## 2021-01-27 NOTE — Progress Notes (Signed)
PRIMARY CARE, MEDICAL OFFICE BUILDING 5  [address]  [place] [address]      Operated by [facility]     Name: Charda Janis MRN:  [id number]   Date: 01/27/2021 Age: 60 y.o.      Chief Complaint   Patient presents with   . Congestion   . Cough   . Rapid Heart Rate     Heart rate running 90-112.         SUBJECTIVE    Patient is 60 y.o. she  presents today for complaint that she has congestion, a cough and her her heart rate is running from 90-112. She is on Bactrim for 7 days and she reports she saw Dr.Kessler for vaginal bleeding. She is currently on bactrim.she reports that Dr.Kessler ordered an US of the vaginal and checked her urine . She says it feels like something is stuck in her throat. She feels like it is congestion.  She does not think she has any seasonal allergy symptoms and she is taking her acid reflux medicine.  On physical exam it appears she does have seasonal allergy symptoms with the boggy addendum  nose and clear nasal drainage.  Will start her on Singulair.  All her to continue Mucinex.  She has not seen a cardiologist in [place]. She saw Dr.[facility] [place].   Dr.Flemming in [place] for pulmonology and has seen Dr. Thurmond Butts here and he is going to do a sleep study. Her sleep study is scheduled for the 29th but she is going to have to change the apt.   She is having her colonoscopy on the 16th.   She is past due for her labs will get labs today also will check EKG today.  She needs a refill on her meloxicam for her arthritis.  She needs her Singulair refilled.  She denies any chest pain, fever, chills, nausea vomiting or diarrhea.  Discussed referral to Cardiology she wants to be referred but she has to wait till she talks to her sister who is a Engineer, civil (consulting).    ROS:      ROS - pertinent for presenting problem.  Systems otherwise negative than what has been noted.    PAST MEDICAL HISTORY  I have reviewed and updated as appropriate the past  medical, surgical, family, and social history today:    Medical History/Surgical History/Family History/Social History  Past Medical History:   Diagnosis Date   . Asthma    . Diabetes (CMS HCC)    . Hypertension    . Hypothyroid          Past Surgical History:   Procedure Laterality Date   . HX TUBAL LIGATION     . LEG SURGERY       Family Medical History:    None          Social History     Socioeconomic History   . Marital status: Unknown   Tobacco Use   . Smoking status: Former Games developer   . Smokeless tobacco: Never Used     Social Determinants of Health     Financial Resource Strain: Medium Risk   . Difficulty of Paying Living Expenses: Somewhat hard   Food Insecurity: No Food Insecurity   . Worried About Programme researcher, broadcasting/film/video in the Last Year: Never true   . Ran Out of Food in the Last Year: Never true   Transportation Needs: No Transportation Needs   . Lack of Transportation (  Medical): No   . Lack of Transportation (Non-Medical): No   Physical Activity: Inactive   . Days of Exercise per Week: 0 days   . Minutes of Exercise per Session: 0 min   Stress: Stress Concern Present   . Feeling of Stress : Rather much   Intimate Partner Violence: Not At Risk   . Fear of Current or Ex-Partner: No   . Emotionally Abused: No   . Physically Abused: No   . Sexually Abused: No   Housing Stability: Low Risk    . Unable to Pay for Housing in the Last Year: No   . Number of Places Lived in the Last Year: 1   . Unstable Housing in the Last Year: No       Allergies:  Allergies   Allergen Reactions   . Clindamycin Phosphate    . Cleocin [Clindamycin] Hives/ Urticaria   . Doxylamin-Pse-Dm-Acetaminophen Hives/ Urticaria and  Other Adverse Reaction (Add comment)   . Tessalon [Benzonatate] Hives/ Urticaria       Medications:  Current Outpatient Medications   Medication Sig   . albuterol sulfate (PROVENTIL OR VENTOLIN OR PROAIR) 90 mcg/actuation Inhalation HFA Aerosol Inhaler Take 2 Puffs by inhalation Every 6 hours as needed   . albuterol  sulfate (PROVENTIL) 2.5 mg /3 mL (0.083 %) Inhalation Solution for Nebulization Take 3 mL (2.5 mg total) by nebulization Four times a day as needed for Wheezing   . amitriptyline (ELAVIL) 25 mg Oral Tablet Take 25 mg by mouth Every night   . aspirin 81 mg Oral Tablet, Chewable Take 81 mg by mouth Once a day   . Blood Sugar Diagnostic (ACCU-CHEK GUIDE TEST STRIPS) Strip 1 Strip by Does not apply route Four times a day   . Blood-Glucose Meter Misc 4 times per day   . calcium carbonate/vitamin D3 (CALCIUM 600 + D ORAL) Take by mouth Once a day   . chlorhexidine gluconate (PERIDEX) 0.12 % Mucous Membrane Mouthwash 15 mL swish and spit Twice daily   . esomeprazole magnesium (NEXIUM) 40 mg Oral Capsule, Delayed Release(E.C.) Take 1 Capsule (40 mg total) by mouth Every morning before breakfast   . fluorouraciL (EFUDEX) 5 % Cream Apply topically Twice daily Apply to lesions as directed   . gabapentin (NEURONTIN) 400 mg Oral Capsule TAKE 1 CAPSULE BY MOUTH THREE TIMES DAILY   . guaiFENesin (MUCINEX) 600 mg Oral Tablet Extended Release 12hr Take 1 Tablet (600 mg total) by mouth Twice daily   . hydroCHLOROthiazide (HYDRODIURIL) 25 mg Oral Tablet Take 1 Tablet (25 mg total) by mouth Once a day   . ketoconazole (NIZORAL) 2 % Shampoo 3x per week 1/2 to 1 oz per shampoo   . Lactobac no.41/Bifidobact no.7 (PROBIOTIC-10 ORAL) Take by mouth Once a day   . Lancets (ACCU-CHEK SOFTCLIX LANCETS) Misc 1 Each by Does not apply route Four times a day   . levothyroxine (SYNTHROID) 175 mcg Oral Tablet TAKE 1 TABLET BY MOUTH IN THE MORNING   . lisinopriL (PRINIVIL) 20 mg Oral Tablet Take 1 tablet by mouth twice daily   . Magnesium 250 mg Oral Tablet Take by mouth Once a day   . meloxicam (MOBIC) 15 mg Oral Tablet Take 1 Tablet (15 mg total) by mouth Once per day as needed for Pain   . metFORMIN (GLUCOPHAGE) 500 mg Oral Tablet Take 1 Tablet (500 mg total) by mouth Twice daily with food   . Milk Thistle 500 mg Oral Capsule Take by  mouth Twice  daily   . montelukast (SINGULAIR) 10 mg Oral Tablet Take 1 Tablet (10 mg total) by mouth Every evening   . nystatin (MYCOSTATIN) 100,000 unit/gram Cream Apply 0.5 g t.i.d. to affected area.   . nystatin (NYSTOP) 100,000 unit/gram Powder by Apply Topically route Three times a day as needed   . oxybutynin (DITROPAN) 5 mg Oral Tablet    . pediatric multivitamins Oral Tablet, Chewable Take 1 Tablet by mouth Once a day   . pravastatin (PRAVACHOL) 10 mg Oral Tablet TAKE 1 TABLET BY MOUTH ONCE DAILY IN THE EVENING   . sertraline (ZOLOFT) 100 mg Oral Tablet Take 1 Tablet (100 mg total) by mouth Once a day   . terbinafine HCL (LAMISIL AT) 1 % Cream Apply topically Twice daily Apply to nails twice daily with a q tip. Apply to bottoms of feet and toes twice daily for 2-4 weeks or until resolution   . tiotropium bromide (SPIRIVA HANDIHALER) 18 mcg Inhalation Capsule, w/Inhalation Device Take 1 Capsule (18 mcg total) by inhalation Once a day   . trimethoprim-sulfamethoxazole (BACTRIM DS) 160-800mg  per tablet Take 1 Tablet (160 mg total) by mouth Twice daily   . Urea 40 % 40 % Cream by Apply Topically route Twice daily Apply a pea sized amount with a q-tip to nails twice daily       Immunizations:  Immunization History   Administered Date(s) Administered   . Covid-19 Vaccine,Pfizer-BioNTech,Purple Top,4613yrs+ 09/22/2019, 10/13/2019       OBJECTIVE    BP 138/80 (Site: Left, Patient Position: Sitting, Cuff Size: Adult)   Pulse 100   Temp 36.3 C (97.3 F)   Resp 18   Ht 1.626 m (5\' 4" )   Wt 99.3 kg (219 lb)   LMP  (LMP Unknown) Comment: stopped having periods at aget 44-46  SpO2 96%   BMI 37.59 kg/m         Nursing Notes:   Marshell GarfinkelMattox, Beverly, LPN  24/40/1007/19/22 [id number]  Sign at exiting of workspace   Travel Screening     Question   Response    In the last 10 days, have you been in contact with someone who was confirmed or suspected to have Coronavirus/COVID-19?  No / Unsure    Have you had a COVID-19 viral test in the last 10 days?   No    Do you have any of the following new or worsening symptoms?  None of these    Have you traveled internationally or domestically in the last month?  No      Travel History   Travel since 12/28/20    No documented travel since 12/28/20           Body mass index is 37.59 kg/m.      PHQ Questionnaire  Little interest or pleasure in doing things.: Several Days  Feeling down, depressed, or hopeless: Several Days  PHQ 2 Total: 2        Fall Risk Assessment  Do you feel unsteady when standing or walking?: Yes  Do you worry about falling?: Yes  Have you fallen in the past year?: Yes  How many times have you fallen?: Once  Were you ever injured from falling?: No         Physical Exam:  General: Pleasant, WNWD, NAD, AAO  Head: Normocephalic, AT,no lesions.   Eyes: PERL, EOM's full, conjunctivae clear, fundi grossly normal.   Nose:  no rhinnorhea noted, no lesions  Throat: Clear, no exudates,  no lesions MMM and pink.  Neck: Supple, no masses, no thyromegaly, no bruits.   Chest: Lungs clear, no rales, no rhonchi, no wheezes.   Heart: RRR, no murmurs, no rubs, no gallops.    Abdomen: Soft, no tenderness, no masses, BS normal.    Extremities: Normal gait, no deformities, no edema.    Neuro: Physiological, no localizing findings.   Psych: Mood and Affect normal      ASSESSMENT:        ICD-10-CM    1. Cough  R05.9 guaiFENesin (MUCINEX) 600 mg Oral Tablet Extended Release 12hr   2. Nasal congestion  R09.81 guaiFENesin (MUCINEX) 600 mg Oral Tablet Extended Release 12hr   3. Essential hypertension  I10 CBC/DIFF     LIPID PANEL     HGA1C (HEMOGLOBIN A1C WITH EST AVG GLUCOSE)     THYROID STIMULATING HORMONE (SENSITIVE TSH)     MICROALBUMIN/CREATININE RATIO, URINE, RANDOM     COMPREHENSIVE METABOLIC PANEL, NON-FASTING   4. Mixed hyperlipidemia  E78.2 LIPID PANEL   5. Abdominal aortic atherosclerosis (CMS HCC)  I70.0 CBC/DIFF     LIPID PANEL     HGA1C (HEMOGLOBIN A1C WITH EST AVG GLUCOSE)     THYROID STIMULATING HORMONE (SENSITIVE  TSH)     MICROALBUMIN/CREATININE RATIO, URINE, RANDOM     COMPREHENSIVE METABOLIC PANEL, NON-FASTING   6. Hypothyroidism due to Hashimoto's thyroiditis  E03.8 THYROID STIMULATING HORMONE (SENSITIVE TSH)    E06.3    7. Uncontrolled type 2 diabetes mellitus with hyperglycemia (CMS HCC)  E11.65 CBC/DIFF     LIPID PANEL     HGA1C (HEMOGLOBIN A1C WITH EST AVG GLUCOSE)     THYROID STIMULATING HORMONE (SENSITIVE TSH)     MICROALBUMIN/CREATININE RATIO, URINE, RANDOM     COMPREHENSIVE METABOLIC PANEL, NON-FASTING   8. Lumbar pain  M54.50 meloxicam (MOBIC) 15 mg Oral Tablet   9. Simple chronic bronchitis (CMS HCC)  J41.0 tiotropium bromide (SPIRIVA HANDIHALER) 18 mcg Inhalation Capsule, w/Inhalation Device   10. Tachycardia  R00.0 ECG 12 LEAD     Body mass index is 37.59 kg/m. BMI addressed: Advised on diet, weight loss, and exercise to reduce above normal BMI.        Laboratory studies/data reviewed  I have reviewed all available and pertinent laboratory studies, images and health maintenance.    PLAN:    Records reviewed today:  Refill needed meds. ATTACHED  Pt was educated/ counseled on the decisions made today with their involvement in these plans.   Preventive counseling: dMii HTN   Diet and exercise reviewed  See dentist and eye doctor with regular scheduled visits.  Active listening/Asked pertinent questions.    A total of (21) minutes was spent on this patient encounter including review of historical information, examination, documentation and post visit activities.  Refer to cardiology for evaluation of HTN cad tachycardia she will call me back with the name.   Order EKG today will call with results  Order labs cbc, cmp, lipids, TSH, hba1c urine micro will call with results  Take mucinex as directed  Start singular take as directed   Refill singular use as directed COPD   Refill lisinopril take as directed HTN   Continue all prescribed medications as directed    HTN Hypertension - Follow a low fat , low cholesterol  diet. Gradually work up to 30 mins a day of exercise with a goal of exercising 3 to 6 days a week. Take all medication as directed. Follow a heart  healthy diet and loose weight. BMI goal of 18.5-24.9 kg/m2. Keep a blood pressure log and bring this log to all appointments. .     Hyperlipidemia - Follow a low fat and low cholesterol diet. Exercise start slowly with the goal of exercising 3 to 5 days a week for at least 30 mins per day. Eating five small meals a day is better than three large meals. Keep an exercise log an bring it to your appointments.     DMII - Follow a diabetic diet such as the ADA diet (American Diabetes Association). Take all medication as directed. Exercise and loose weight because even a 10 lb weight loss can make a big difference in your blood sugar. Keep an exercise log and record your weight once a week. Keep a blood sugar log and bring this to all appointments.  Recognize signs, symptoms, and treatment of hypoglycemia (treatment:  Glucose of any form of glucose-containing carbohydrate).  Control blood pressure and in here with antihypertensive medications.  Follow a low-carbohydrate low-fat diet, especially low in saturated fats.  Lower sodium intake.  Get regular aerobic exercise with a goal of at least 150 minutes per week of moderate intensity exercises.  Maintain a healthy body weight.    Gerd - GERD or acid reflux can cause a chronic cough. Try raising the head of your bed about 4 inches. Avoid eating and drinking 2 hours prior  to going to bed. Alcoholic beverages and drinks with caffeine in them, chocolate, spicy and greasy food should be consumed in moderation. Antacids such as tums or acid blockers such as pepcid, zantax, or tagamet may also be helpful. Loosing weight can also be helpful. Take all medication as directed.     FOLLOW UP:  Return in about 3 months (around 04/30/2021) for medicare wellness exam .    Patient can return sooner if needed.      The patient/care give was  given ample opportunity to ask questions and those questions were answered to his/her satisfaction. A good faith effort was made to reconcile the patient's medications. The patient/caregiver was counseled on any appropriate vaccinations by the provider and questions were answered. The patient/care giver was told to contact me with any additional questions or concerns, or go to the ED in an emergency.       Bing Plume, DO. 01/27/21 12:11  [facility]   Medicine  [address]  [place], [place] [address]  Phone [phone]    This note may have been partially generated using M-Modal Fluency Direct system, and there may be some incorrect words, spellings, and punctuation that were not noted in checking the note before saving.

## 2021-01-28 ENCOUNTER — Ambulatory Visit (HOSPITAL_COMMUNITY): Payer: Self-pay

## 2021-01-28 NOTE — Result Encounter Note (Signed)
Please advise her EKG is normal I have referred her to the cardiologist

## 2021-01-28 NOTE — Result Encounter Note (Signed)
Please advise patient that her thyroid hormone is normal, her cholesterol is normal, her potassium is slightly elevated not much, her glucose is slightly elevated but improved hemoglobin A1c over 11 months ago.  Eleven months ago her hemoglobin A1c was 7.6 now it is 6.4 that is great.  Her hemoglobin and hematocrit which is her blood count is decreased.  It appears she is deficient in iron B12 and folic acid.  Ask her has she seen any blood in her stool.  If not I want to recheck her hemoglobin in 2 weeks.

## 2021-02-06 ENCOUNTER — Other Ambulatory Visit: Payer: Self-pay

## 2021-02-06 ENCOUNTER — Encounter (HOSPITAL_BASED_OUTPATIENT_CLINIC_OR_DEPARTMENT_OTHER): Payer: Self-pay | Admitting: Obstetrics & Gynecology

## 2021-02-06 ENCOUNTER — Ambulatory Visit: Payer: Medicare PPO | Attending: Obstetrics & Gynecology | Admitting: Obstetrics & Gynecology

## 2021-02-06 VITALS — BP 100/58 | HR 90 | Resp 18 | Ht 64.0 in | Wt 223.4 lb

## 2021-02-06 DIAGNOSIS — N950 Postmenopausal bleeding: Secondary | ICD-10-CM | POA: Insufficient documentation

## 2021-02-06 DIAGNOSIS — N939 Abnormal uterine and vaginal bleeding, unspecified: Secondary | ICD-10-CM | POA: Insufficient documentation

## 2021-02-06 NOTE — Progress Notes (Signed)
[facility] [place], [facility]  [address]  [place] [address]  [phone]    Arnetra Terris 02/06/2021   [id number] [birth year]     Chief Complaint: Follow Up (Test results//)      Subjective: Angela Frey is a 60 y.o. female presenting for f/u on testing for PMB. Pt states after the [place] she bled "like a stuck pig".   [place] did not reveal any abnormalities. Infectious work up neg for [employer]. Pt did have UTI that was treated.     Medical History   I have reviewed and updated as appropriate the past medical, family and social history today:    Medical History/Surgical History/Family History  Past Medical History:   Diagnosis Date   . Asthma    . Diabetes (CMS HCC)    . Hypertension    . Hypothyroid           Past Surgical History:   Procedure Laterality Date   . HX TUBAL LIGATION     . LEG SURGERY            Family Medical History:    None          Menstrual History   . Total time span for birth control pills     . Use of HRT (how long total use)     . Use of Infertility Treatments     . Age of Menarche     . Avg. frequency (days)     . Duration (days)     . Flow     . Pattern     . Age of Menopause         OB History   No obstetric history on file.     Social History     Socioeconomic History   . Marital status: Unknown   Tobacco Use   . Smoking status: Former Games developer   . Smokeless tobacco: Never Used     Social Determinants of Health     Financial Resource Strain: Medium Risk   . Difficulty of Paying Living Expenses: Somewhat hard   Food Insecurity: No Food Insecurity   . Worried About Programme researcher, broadcasting/film/video in the Last Year: Never true   . Ran Out of Food in the Last Year: Never true   Transportation Needs: No Transportation Needs   . Lack of Transportation (Medical): No   . Lack of Transportation (Non-Medical): No   Physical Activity: Inactive   . Days of Exercise per Week: 0 days   . Minutes of Exercise per Session: 0 min   Stress: Stress  Concern Present   . Feeling of Stress : Rather much   Intimate Partner Violence: Not At Risk   . Fear of Current or Ex-Partner: No   . Emotionally Abused: No   . Physically Abused: No   . Sexually Abused: No   Housing Stability: Low Risk    . Unable to Pay for Housing in the Last Year: No   . Number of Places Lived in the Last Year: 1   . Unstable Housing in the Last Year: No     Expanded Substance History       Allergies:  Allergies   Allergen Reactions   . Clindamycin Phosphate    . Cleocin [Clindamycin] Hives/ Urticaria   . Doxylamin-Pse-Dm-Acetaminophen Hives/ Urticaria and  Other Adverse Reaction (Add comment)   . Tessalon [Benzonatate] Hives/ Urticaria  Medications:  Current Outpatient Medications   Medication Sig   . albuterol sulfate (PROVENTIL OR VENTOLIN OR PROAIR) 90 mcg/actuation Inhalation HFA Aerosol Inhaler Take 2 Puffs by inhalation Every 6 hours as needed   . albuterol sulfate (PROVENTIL) 2.5 mg /3 mL (0.083 %) Inhalation Solution for Nebulization Take 3 mL (2.5 mg total) by nebulization Four times a day as needed for Wheezing   . amitriptyline (ELAVIL) 25 mg Oral Tablet Take 25 mg by mouth Every night   . aspirin 81 mg Oral Tablet, Chewable Take 81 mg by mouth Once a day   . Blood Sugar Diagnostic (ACCU-CHEK GUIDE TEST STRIPS) Strip 1 Strip by Does not apply route Four times a day   . Blood-Glucose Meter Misc 4 times per day   . calcium carbonate/vitamin D3 (CALCIUM 600 + D ORAL) Take by mouth Once a day   . chlorhexidine gluconate (PERIDEX) 0.12 % Mucous Membrane Mouthwash 15 mL swish and spit Twice daily   . esomeprazole magnesium (NEXIUM) 40 mg Oral Capsule, Delayed Release(E.C.) Take 1 Capsule (40 mg total) by mouth Every morning before breakfast   . fluorouraciL (EFUDEX) 5 % Cream Apply topically Twice daily Apply to lesions as directed   . gabapentin (NEURONTIN) 400 mg Oral Capsule TAKE 1 CAPSULE BY MOUTH THREE TIMES DAILY   . guaiFENesin (MUCINEX) 600 mg Oral Tablet Extended Release 12hr  Take 1 Tablet (600 mg total) by mouth Twice daily   . hydroCHLOROthiazide (HYDRODIURIL) 25 mg Oral Tablet Take 1 Tablet (25 mg total) by mouth Once a day   . ketoconazole (NIZORAL) 2 % Shampoo 3x per week 1/2 to 1 oz per shampoo   . Lactobac no.41/Bifidobact no.7 (PROBIOTIC-10 ORAL) Take by mouth Once a day   . Lancets (ACCU-CHEK SOFTCLIX LANCETS) Misc 1 Each by Does not apply route Four times a day   . levothyroxine (SYNTHROID) 175 mcg Oral Tablet TAKE 1 TABLET BY MOUTH IN THE MORNING   . lisinopriL (PRINIVIL) 20 mg Oral Tablet Take 1 Tablet (20 mg total) by mouth Twice daily   . Magnesium 250 mg Oral Tablet Take by mouth Once a day   . meloxicam (MOBIC) 15 mg Oral Tablet Take 1 Tablet (15 mg total) by mouth Once per day as needed for Pain   . metFORMIN (GLUCOPHAGE) 500 mg Oral Tablet Take 1 Tablet (500 mg total) by mouth Twice daily with food   . Milk Thistle 500 mg Oral Capsule Take by mouth Twice daily   . montelukast (SINGULAIR) 10 mg Oral Tablet Take 1 Tablet (10 mg total) by mouth Every evening   . nystatin (MYCOSTATIN) 100,000 unit/gram Cream Apply 0.5 g t.i.d. to affected area.   . nystatin (NYSTOP) 100,000 unit/gram Powder by Apply Topically route Three times a day as needed   . oxybutynin (DITROPAN) 5 mg Oral Tablet    . pediatric multivitamins Oral Tablet, Chewable Take 1 Tablet by mouth Once a day   . pravastatin (PRAVACHOL) 10 mg Oral Tablet TAKE 1 TABLET BY MOUTH ONCE DAILY IN THE EVENING   . sertraline (ZOLOFT) 100 mg Oral Tablet Take 1 Tablet (100 mg total) by mouth Once a day   . terbinafine HCL (LAMISIL AT) 1 % Cream Apply topically Twice daily Apply to nails twice daily with a q tip. Apply to bottoms of feet and toes twice daily for 2-4 weeks or until resolution   . tiotropium bromide (SPIRIVA HANDIHALER) 18 mcg Inhalation Capsule, w/Inhalation Device Take 1 Capsule (18 mcg  total) by inhalation Once a day   . trimethoprim-sulfamethoxazole (BACTRIM DS) 160-800mg  per tablet Take 1 Tablet (160 mg  total) by mouth Twice daily   . Urea 40 % 40 % Cream by Apply Topically route Twice daily Apply a pea sized amount with a q-tip to nails twice daily     Immunization History:  Immunization History   Administered Date(s) Administered   . Covid-19 Vaccine,Pfizer-BioNTech,Purple Top,75yrs+ 09/22/2019, 10/13/2019     Problem List:  Patient Active Problem List    Diagnosis Date Noted   . Abdominal aortic atherosclerosis (CMS HCC) 07/09/2020   . Mixed stress and urge urinary incontinence 02/28/2020   . Essential hypertension 02/20/2020   . Hypothyroidism due to Hashimoto's thyroiditis 02/20/2020   . Morbid obesity (CMS HCC) 02/20/2020   . Uncontrolled type 2 diabetes mellitus with hyperglycemia (CMS HCC) 02/20/2020   . Mild intermittent asthma without complication 02/20/2020   . Simple chronic bronchitis (CMS HCC) 02/20/2020   . Moderate major depression (CMS HCC) 02/20/2020   . Gastroesophageal reflux disease without esophagitis 02/20/2020   . Seborrheic dermatitis of scalp 02/20/2020   . Mixed hyperlipidemia 02/20/2020   . Primary insomnia 02/20/2020       Review of Systems:  Patient was asked about all of the following:  Constitutional: Weakness, fatigue, fever, chills, weight loss, weight gain, daytime sleeping.  Eyes: Glasses, blurred vision, double vision, burning, cataracts, vision changes, problems with bright lights, itching.  Ears, nose, mouth, throat, and face: Earache or drainage, hearing loss, hoarseness, nose bleeds, sinus problems, sore throat, ringing in the ears, nasal congestion.  Respiratory:  Shortness of breath, cough, coughing up blood, recent lung infection, snoring, wheezing, home breathing treatment.  Cardiovascular:  Chest pain, swelling legs, heart trouble, palpitations, leg cramps, SOB with exertion, murmur.  Breast: No new breast lumps or masses  Gastrointestinal: Abdominal pain, black stool, constipation, diarrhea, heartburn, nausea, vomiting, swallowing problems.  Genitourinary:  Blood in  urine, painful urination, increased frequency, leaking urine, recurrent UTI's, urination at night, painful periods, straining.  Integument/breast:  Rash, itching, color change of lesion, change in size of lesion, bleeding ulcer/lesion, new lesion, delayed healing.  Hematologic/lymphatic: Anemia, blood clots, easy bruising, hepatitis exposure, transfusions, swollen lymph nodes, bleeding problems.  Musculoskeletal: Back pain, joint pain, difficulty walking, cramps, trauma, weakness, joint swelling.  Neurological: Dizziness, headache, numbness, paralysis, seizures, stroke, memory loss.  Behavioral/Psych: Suicidal ideations, homicidal ideations, anxiety, depression, confusion, sleeping problems, stress, panic attacks, hallucinations.  Endocrine: Dry skin, excess thirst, hair loss, heat intolerance, cold intolerance, excess hair growth.    ROS neg except as mentioned in CC.    Objective   Vitals:  BP (!) 100/58   Pulse 90   Resp 18   Ht 1.626 m (5\' 4" )   Wt 101 kg (223 lb 6.4 oz)   LMP  (LMP Unknown) Comment: stopped having periods at aget 44-46  SpO2 98%   BMI 38.35 kg/m       Height: 162.6 cm (5\' 4" )  Wt Readings from Last 5 Encounters:   02/06/21 101 kg (223 lb 6.4 oz)   01/27/21 99.3 kg (219 lb)   01/16/21 98 kg (216 lb)   10/14/20 99 kg (218 lb 3.2 oz)   07/09/20 99.3 kg (219 lb)     Body mass index is 38.35 kg/m.    Physical Exam:  General Appearance: appears in good health, mildly obese, appears stated age, no distress and vital signs reviewed  Head: NCAT  Eyes: lids  and lashes normal, conjunctivae and sclerae normal and pupils equal, EOM intact  Psychiatric: Normal affect, behavior, memory, thought content, judgement, and speech.  Neurology: normal without focal findings  mental status, speech normal, alert and oriented x iii             Laboratory Studies/Data Reviewed   I have reviewed all available and pertinent laboratory studies, imaging and health maintenance  Health Maintenance   Topic Date Due    . Pap smear  Never done   . Colonoscopy  Never done   . Covid-19 Vaccine (3 - Booster for Pfizer series) 01/27/2022 (Originally 03/14/2020)   . Pneumococcal Vaccine, Age 3-64 (1 - PCV) 01/27/2022 (Originally 01/08/1967)   . Diabetic Retinal Exam  03/14/2021   . Diabetes A1C  07/30/2021   . Adult Tdap-Td (2 - Td or Tdap) 06/13/2022   . Mammography  07/01/2022   . Annual Wellness Visit - Calendar Year Ins[employer]  Completed   . Meningococcal Vaccine  Aged Out   . Hepatitis C screening  Discontinued   . HIV Screening  Discontinued   . Influenza Vaccine  Discontinued   . Shingles Vaccine  Discontinued     Nursing Notes:   Alfred Levins, LPN  04/54/09 8119  Signed   Travel Screening     Question   Response    In the last 10 days, have you been in contact with someone who was confirmed or suspected to have Coronavirus/COVID-19?  No / Unsure    Have you had a COVID-19 viral test in the last 10 days?  No    Do you have any of the following new or worsening symptoms?  None of these    Have you traveled internationally or domestically in the last month?  No      Travel History   Travel since 01/07/21    No documented travel since 01/07/21       Body mass index is 38.35 kg/m.         I have reviewed all health maintenance items with the patient.  Assessment/Plan   Diagnosis    ICD-10-CM    1. PMB (postmenopausal bleeding)  N95.0    2. Abnormal vaginal bleeding  N93.9        Encounter Medications and Orders  No orders of the defined types were placed in this encounter.        Plan  Discussed options for endometrial sampling, pt agrees to in office [facility], will premedicate pt prior to procedure        Return for hysteroscopy .    Seward Meth, DO

## 2021-02-06 NOTE — Nursing Note (Signed)
Travel Screening     Question   Response    In the last 10 days, have you been in contact with someone who was confirmed or suspected to have Coronavirus/COVID-19?  No / Unsure    Have you had a COVID-19 viral test in the last 10 days?  No    Do you have any of the following new or worsening symptoms?  None of these    Have you traveled internationally or domestically in the last month?  No      Travel History   Travel since 01/07/21    No documented travel since 01/07/21       Body mass index is 38.35 kg/m.

## 2021-02-16 ENCOUNTER — Telehealth (INDEPENDENT_AMBULATORY_CARE_PROVIDER_SITE_OTHER): Payer: Self-pay | Admitting: FAMILY MEDICINE

## 2021-02-16 NOTE — Telephone Encounter (Signed)
Angela Frey called and stated that she received a phone call stating they wanted to give results of blood work but I do not see any messages about blood work in her chart.  She is requesting a phone call,  I do not care to call her concerning this but I need to know what to tell her.      Thanks    [name], Unit Clerk  02/16/2021, 16:10

## 2021-02-17 NOTE — Telephone Encounter (Signed)
Patient stated theres no blood in her stool, and advised her to have the repeat labs in 2 weeks.      [name], Unit Clerk  02/17/2021, 12:57

## 2021-02-17 NOTE — Telephone Encounter (Signed)
I called the patient back on 07/19 about her blood work but not since then.  The following is the results of that blood work please advise patient her EKG was normal  Please advise patient that her thyroid hormone is normal, her cholesterol is normal, her potassium is slightly elevated not much, her glucose is slightly elevated but improved hemoglobin A1c over 11 months ago.  Eleven months ago her hemoglobin A1c was 7.6 now it is 6.4 that is great.  Her hemoglobin and hematocrit which is her blood count is decreased.  It appears she is deficient in iron B12 and folic acid.  Ask her has she seen any blood in her stool.  If not I want to recheck her hemoglobin in 2 weeks.

## 2021-02-27 ENCOUNTER — Other Ambulatory Visit (INDEPENDENT_AMBULATORY_CARE_PROVIDER_SITE_OTHER): Payer: Self-pay | Admitting: FAMILY MEDICINE

## 2021-02-27 ENCOUNTER — Telehealth (INDEPENDENT_AMBULATORY_CARE_PROVIDER_SITE_OTHER): Payer: Self-pay | Admitting: FAMILY MEDICINE

## 2021-02-27 DIAGNOSIS — M62838 Other muscle spasm: Secondary | ICD-10-CM

## 2021-02-27 DIAGNOSIS — M792 Neuralgia and neuritis, unspecified: Secondary | ICD-10-CM

## 2021-02-27 DIAGNOSIS — M545 Low back pain, unspecified: Secondary | ICD-10-CM

## 2021-02-27 MED ORDER — BACLOFEN 10 MG TABLET
10.0000 mg | ORAL_TABLET | Freq: Three times a day (TID) | ORAL | 2 refills | Status: DC
Start: 2021-02-27 — End: 2021-07-01

## 2021-02-27 NOTE — Telephone Encounter (Signed)
Pt called in and stated she needs refills on her Baclofen.     She also has some questions for Bev if you can give her a call.    Thanks!    Cecile Sheerer  02/27/2021, 16:21

## 2021-03-01 ENCOUNTER — Other Ambulatory Visit (INDEPENDENT_AMBULATORY_CARE_PROVIDER_SITE_OTHER): Payer: Self-pay | Admitting: FAMILY MEDICINE

## 2021-03-01 DIAGNOSIS — E063 Autoimmune thyroiditis: Secondary | ICD-10-CM

## 2021-03-01 DIAGNOSIS — E038 Other specified hypothyroidism: Secondary | ICD-10-CM

## 2021-03-03 ENCOUNTER — Other Ambulatory Visit (INDEPENDENT_AMBULATORY_CARE_PROVIDER_SITE_OTHER): Payer: Self-pay | Admitting: Obstetrics & Gynecology

## 2021-03-03 MED ORDER — LORAZEPAM 0.5 MG TABLET
0.5000 mg | ORAL_TABLET | Freq: Once | ORAL | 0 refills | Status: AC
Start: 2021-03-03 — End: 2021-03-03

## 2021-03-03 MED ORDER — TRAMADOL 50 MG TABLET
1.0000 | ORAL_TABLET | Freq: Once | ORAL | 0 refills | Status: AC
Start: 2021-03-03 — End: 2021-03-03

## 2021-03-03 MED ORDER — PROMETHAZINE 12.5 MG TABLET
12.5000 mg | ORAL_TABLET | Freq: Once | ORAL | 0 refills | Status: AC
Start: 2021-03-03 — End: 2021-03-03

## 2021-03-05 ENCOUNTER — Other Ambulatory Visit (INDEPENDENT_AMBULATORY_CARE_PROVIDER_SITE_OTHER): Payer: Self-pay | Admitting: FAMILY MEDICINE

## 2021-03-05 DIAGNOSIS — E782 Mixed hyperlipidemia: Secondary | ICD-10-CM

## 2021-03-05 DIAGNOSIS — E1165 Type 2 diabetes mellitus with hyperglycemia: Secondary | ICD-10-CM

## 2021-03-06 ENCOUNTER — Ambulatory Visit (HOSPITAL_BASED_OUTPATIENT_CLINIC_OR_DEPARTMENT_OTHER): Payer: Self-pay | Admitting: Obstetrics & Gynecology

## 2021-03-19 ENCOUNTER — Ambulatory Visit (HOSPITAL_BASED_OUTPATIENT_CLINIC_OR_DEPARTMENT_OTHER): Payer: Self-pay | Admitting: Obstetrics & Gynecology

## 2021-03-20 ENCOUNTER — Telehealth (INDEPENDENT_AMBULATORY_CARE_PROVIDER_SITE_OTHER): Payer: Self-pay | Admitting: Obstetrics & Gynecology

## 2021-03-20 ENCOUNTER — Ambulatory Visit (HOSPITAL_BASED_OUTPATIENT_CLINIC_OR_DEPARTMENT_OTHER): Payer: Self-pay | Admitting: Obstetrics & Gynecology

## 2021-03-20 NOTE — Telephone Encounter (Signed)
Nina sch nurse in OR sent me a internal message asking if pt was having endo sampling/biopsy in office per last office note due to pt is on the OR sch. I called pt spoke with pt she stated "she decided to have it in OR due to the medications she was going to take prior to surg in offcie would not help her". I have informed Coralee North of covo with pt.

## 2021-03-23 ENCOUNTER — Other Ambulatory Visit (INDEPENDENT_AMBULATORY_CARE_PROVIDER_SITE_OTHER): Payer: Self-pay | Admitting: FAMILY MEDICINE

## 2021-03-23 DIAGNOSIS — M792 Neuralgia and neuritis, unspecified: Secondary | ICD-10-CM

## 2021-03-24 ENCOUNTER — Encounter (HOSPITAL_COMMUNITY): Payer: Self-pay | Admitting: Obstetrics & Gynecology

## 2021-03-24 NOTE — Anesthesia Preprocedure Evaluation (Signed)
ANESTHESIA PRE-OP EVALUATION  Planned Procedure: HYSTEROSCOPY WITH DILATION AND CURETTAGE (N/A )  Review of Systems         patient summary reviewed          Pulmonary   asthma,   Cardiovascular    Hypertension, well controlled, ECG reviewed and Abdominal aortic athrosclerosis ,No peripheral edema,  Exercise Tolerance: <4 METS        GI/Hepatic/Renal    Urinary stress incontinence, GERD and poorly controlled        Endo/Other    hypothyroidism and morbid obesity,   type 2 diabetes/ poorly controlled    Neuro/Psych/MS        Cancer                      Physical Assessment      Airway       Mallampati: III    TM distance: >3 FB    Neck ROM: full  Mouth Opening: fair.  No Facial hair  No Beard  No endotracheal tube present  No Tracheostomy present    Dental                    Pulmonary    Breath sounds clear to auscultation  (-) no rhonchi, no decreased breath sounds, no wheezes, no rales and no stridor     Cardiovascular    Rhythm: regular  Rate: Normal  (-) no friction rub, carotid bruit is not present, no peripheral edema and no murmur     Other findings            Plan  ASA 3     Planned anesthesia type: MAC                     Intravenous induction     Anesthesia issues/risks discussed are: Cardiac Events/MI, Post-op Agitation/Tantrum, Post-op Cognitive Dysfunction, PONV, Aspiration, Post-op Pain Management, Intraoperative Awareness/ Recall and Dental Injuries.  Anesthetic plan and risks discussed with patient  Signed consent obtained            Patient's NPO status is appropriate for Anesthesia.           Plan discussed with physician.

## 2021-04-01 ENCOUNTER — Encounter (HOSPITAL_BASED_OUTPATIENT_CLINIC_OR_DEPARTMENT_OTHER): Payer: Self-pay | Admitting: Obstetrics & Gynecology

## 2021-04-01 ENCOUNTER — Ambulatory Visit (HOSPITAL_COMMUNITY): Payer: Medicare PPO

## 2021-04-01 ENCOUNTER — Other Ambulatory Visit: Payer: Self-pay

## 2021-04-01 ENCOUNTER — Ambulatory Visit: Payer: Medicare PPO | Attending: Obstetrics & Gynecology | Admitting: Obstetrics & Gynecology

## 2021-04-01 VITALS — BP 112/68 | HR 94 | Temp 97.6°F | Resp 18 | Ht 64.0 in | Wt 222.0 lb

## 2021-04-01 DIAGNOSIS — N950 Postmenopausal bleeding: Secondary | ICD-10-CM | POA: Insufficient documentation

## 2021-04-01 DIAGNOSIS — Z01818 Encounter for other preprocedural examination: Secondary | ICD-10-CM | POA: Insufficient documentation

## 2021-04-01 LAB — URINALYSIS, MACRO/MICRO
BILIRUBIN: NEGATIVE mg/dL
GLUCOSE: NEGATIVE mg/dL
KETONES: NEGATIVE mg/dL
NITRITE: NEGATIVE
PH: 7 (ref 4.6–8.0)
PROTEIN: NEGATIVE mg/dL
SPECIFIC GRAVITY: 1.015 (ref 1.005–1.030)
UROBILINOGEN: 0.2 mg/dL (ref 0.2–1.0)

## 2021-04-01 LAB — URINALYSIS, MICROSCOPIC
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
## 2021-04-01 NOTE — Progress Notes (Signed)
[facility]N, [facility]  [address]  [place] [address]  [phone]    Angela Frey 04/01/2021   [id number] 05-30-1961     Chief Complaint: Other (Pre-op)      Subjective: Angela Frey is a 60 y.o. female presenting for Preop visit. Pt desires Hysteroscopy D&C under anesthesia. She is having PMB. [place] did not reveal any gross abnormalities. Urea/myco testing was neg.    Pt has chest CT in November 2021 revealing mild emphysema. EKG in July 2022 NSR.  Pt had event of bronchitis in July that caused her to have some tachycardia, which resolved with treatment of bronchitis.   Pt reports Normal stress test 3 years ago with Cariologist Dr Lady Gary in [place].    Medical History   I have reviewed and updated as appropriate the past medical, family and social history today:    Medical History/Surgical History/Family History  Past Medical History:   Diagnosis Date   . Asthma    . Diabetes (CMS HCC)    . Hypertension    . Hypothyroid           Past Surgical History:   Procedure Laterality Date   . HX TUBAL LIGATION     . LEG SURGERY            Family Medical History:    None          Menstrual History   . Total time span for birth control pills     . Use of HRT (how long total use)     . Use of Infertility Treatments     . Age of Menarche     . Avg. frequency (days)     . Duration (days)     . Flow     . Pattern     . Age of Menopause         OB History   No obstetric history on file.     Social History     Socioeconomic History   . Marital status: Unknown   Tobacco Use   . Smoking status: Former Games developer   . Smokeless tobacco: Never Used     Social Determinants of Health     Financial Resource Strain: Medium Risk   . Difficulty of Paying Living Expenses: Somewhat hard   Food Insecurity: No Food Insecurity   . Worried About Programme researcher, broadcasting/film/video in the Last Year: Never true   . Ran Out of Food in the Last Year: Never true   Transportation Needs: No Transportation Needs   . Lack of Transportation  (Medical): No   . Lack of Transportation (Non-Medical): No   Physical Activity: Inactive   . Days of Exercise per Week: 0 days   . Minutes of Exercise per Session: 0 min   Stress: Stress Concern Present   . Feeling of Stress : Rather much   Housing Stability: Unknown   . Unable to Pay for Housing in the Last Year: No   . Unstable Housing in the Last Year: No     Expanded Substance History       Allergies:  Allergies   Allergen Reactions   . Clindamycin Phosphate    . Cleocin [Clindamycin] Hives/ Urticaria   . Doxylamin-Pse-Dm-Acetaminophen Hives/ Urticaria and  Other Adverse Reaction (Add comment)   . Tessalon [Benzonatate] Hives/ Urticaria     Medications:  Current Outpatient Medications   Medication Sig   . albuterol sulfate (PROVENTIL  OR VENTOLIN OR PROAIR) 90 mcg/actuation Inhalation HFA Aerosol Inhaler Take 2 Puffs by inhalation Every 6 hours as needed   . albuterol sulfate (PROVENTIL) 2.5 mg /3 mL (0.083 %) Inhalation Solution for Nebulization Take 3 mL (2.5 mg total) by nebulization Four times a day as needed for Wheezing   . amitriptyline (ELAVIL) 25 mg Oral Tablet Take 25 mg by mouth Every night   . aspirin 81 mg Oral Tablet, Chewable Take 81 mg by mouth Once a day   . baclofen (LIORESAL) 10 mg Oral Tablet Take 1 Tablet (10 mg total) by mouth Three times a day   . Blood Sugar Diagnostic (ACCU-CHEK GUIDE TEST STRIPS) Strip 1 Strip by Does not apply route Four times a day   . Blood-Glucose Meter Misc 4 times per day   . calcium carbonate/vitamin D3 (CALCIUM 600 + D ORAL) Take by mouth Once a day   . chlorhexidine gluconate (PERIDEX) 0.12 % Mucous Membrane Mouthwash 15 mL swish and spit Twice daily   . cyanocobalamin (VITAMIN B 12) 1,000 mcg Oral Tablet Take 1,000 mcg by mouth Once a day   . esomeprazole magnesium (NEXIUM) 40 mg Oral Capsule, Delayed Release(E.C.) Take 1 Capsule (40 mg total) by mouth Every morning before breakfast   . fluorouraciL (EFUDEX) 5 % Cream Apply topically Twice daily Apply to  lesions as directed   . gabapentin (NEURONTIN) 400 mg Oral Capsule TAKE 1 CAPSULE BY MOUTH THREE TIMES DAILY   . guaiFENesin (MUCINEX) 600 mg Oral Tablet Extended Release 12hr Take 1 Tablet (600 mg total) by mouth Twice daily   . hydroCHLOROthiazide (HYDRODIURIL) 25 mg Oral Tablet Take 1 Tablet (25 mg total) by mouth Once a day   . ketoconazole (NIZORAL) 2 % Shampoo 3x per week 1/2 to 1 oz per shampoo   . Lactobac no.41/Bifidobact no.7 (PROBIOTIC-10 ORAL) Take by mouth Once a day   . Lancets (ACCU-CHEK SOFTCLIX LANCETS) Misc 1 Each by Does not apply route Four times a day   . levothyroxine (SYNTHROID) 175 mcg Oral Tablet TAKE 1 TABLET BY MOUTH IN THE MORNING   . lisinopriL (PRINIVIL) 20 mg Oral Tablet Take 1 Tablet (20 mg total) by mouth Twice daily   . Magnesium 250 mg Oral Tablet Take by mouth Once a day (Patient not taking: Reported on 04/01/2021)   . meloxicam (MOBIC) 15 mg Oral Tablet Take 1 Tablet (15 mg total) by mouth Once per day as needed for Pain   . metFORMIN (GLUCOPHAGE) 500 mg Oral Tablet Take 1 tablet by mouth twice daily with food   . Milk Thistle 500 mg Oral Capsule Take by mouth Twice daily   . montelukast (SINGULAIR) 10 mg Oral Tablet Take 1 Tablet (10 mg total) by mouth Every evening   . nystatin (MYCOSTATIN) 100,000 unit/gram Cream Apply 0.5 g t.i.d. to affected area.   . nystatin (NYSTOP) 100,000 unit/gram Powder by Apply Topically route Three times a day as needed   . oxybutynin (DITROPAN) 5 mg Oral Tablet  (Patient not taking: Reported on 04/01/2021)   . pediatric multivitamins Oral Tablet, Chewable Take 1 Tablet by mouth Once a day   . pravastatin (PRAVACHOL) 10 mg Oral Tablet TAKE 1 TABLET BY MOUTH ONCE DAILY IN THE EVENING   . sertraline (ZOLOFT) 100 mg Oral Tablet Take 1 Tablet (100 mg total) by mouth Once a day   . terbinafine HCL (LAMISIL AT) 1 % Cream Apply topically Twice daily Apply to nails twice daily with a q  tip. Apply to bottoms of feet and toes twice daily for 2-4 weeks or until  resolution   . tiotropium bromide (SPIRIVA HANDIHALER) 18 mcg Inhalation Capsule, w/Inhalation Device Take 1 Capsule (18 mcg total) by inhalation Once a day   . Urea 40 % 40 % Cream by Apply Topically route Twice daily Apply a pea sized amount with a q-tip to nails twice daily     Immunization History:  Immunization History   Administered Date(s) Administered   . Covid-19 Vaccine,Pfizer-BioNTech,Purple Top,51yrs+ 09/22/2019, 10/13/2019     Problem List:  Patient Active Problem List    Diagnosis Date Noted   . PMB (postmenopausal bleeding) 04/01/2021   . Abdominal aortic atherosclerosis (CMS HCC) 07/09/2020   . Mixed stress and urge urinary incontinence 02/28/2020   . Essential hypertension 02/20/2020   . Hypothyroidism due to Hashimoto's thyroiditis 02/20/2020   . Morbid obesity (CMS HCC) 02/20/2020   . Uncontrolled type 2 diabetes mellitus with hyperglycemia (CMS HCC) 02/20/2020   . Mild intermittent asthma without complication 02/20/2020   . Simple chronic bronchitis (CMS HCC) 02/20/2020   . Moderate major depression (CMS HCC) 02/20/2020   . Gastroesophageal reflux disease without esophagitis 02/20/2020   . Seborrheic dermatitis of scalp 02/20/2020   . Mixed hyperlipidemia 02/20/2020   . Primary insomnia 02/20/2020       Review of Systems:  Patient was asked about all of the following:  Constitutional: Weakness, fatigue, fever, chills, weight loss, weight gain, daytime sleeping.  Eyes: Glasses, blurred vision, double vision, burning, cataracts, vision changes, problems with bright lights, itching.  Ears, nose, mouth, throat, and face: Earache or drainage, hearing loss, hoarseness, nose bleeds, sinus problems, sore throat, ringing in the ears, nasal congestion.  Respiratory:  Shortness of breath, cough, coughing up blood, recent lung infection, snoring, wheezing, home breathing treatment.  Cardiovascular:  Chest pain, swelling legs, heart trouble, palpitations, leg cramps, SOB with exertion, murmur.  Breast: No new  breast lumps or masses  Gastrointestinal: Abdominal pain, black stool, constipation, diarrhea, heartburn, nausea, vomiting, swallowing problems.  Genitourinary:  Blood in urine, painful urination, increased frequency, leaking urine, recurrent UTI's, urination at night, painful periods, straining.  Integument/breast:  Rash, itching, color change of lesion, change in size of lesion, bleeding ulcer/lesion, new lesion, delayed healing.  Hematologic/lymphatic: Anemia, blood clots, easy bruising, hepatitis exposure, transfusions, swollen lymph nodes, bleeding problems.  Musculoskeletal: Back pain, joint pain, difficulty walking, cramps, trauma, weakness, joint swelling.  Neurological: Dizziness, headache, numbness, paralysis, seizures, stroke, memory loss.  Behavioral/Psych: Suicidal ideations, homicidal ideations, anxiety, depression, confusion, sleeping problems, stress, panic attacks, hallucinations.  Endocrine: Dry skin, excess thirst, hair loss, heat intolerance, cold intolerance, excess hair growth.    ROS neg except as mentioned in CC.    Objective   Vitals:  BP 112/68   Pulse 94   Temp 36.4 C (97.6 F)   Resp 18   Ht 1.626 m (5\' 4" )   Wt 101 kg (222 lb)   LMP  (LMP Unknown) Comment: stopped having periods at aget 44-46  SpO2 96%   Breastfeeding No   BMI 38.11 kg/m       Height: 162.6 cm (5\' 4" )  Wt Readings from Last 5 Encounters:   04/01/21 101 kg (222 lb)   02/06/21 101 kg (223 lb 6.4 oz)   01/27/21 99.3 kg (219 lb)   01/16/21 98 kg (216 lb)   10/14/20 99 kg (218 lb 3.2 oz)     Body mass index is 38.11  kg/m.    Physical Exam:  General Appearance: appears in good health, moderately obese, appears stated age, no distress and vital signs reviewed  Head: NCAT  Eyes: lids and lashes normal, conjunctivae and sclerae normal and pupils equal, EOM intact  Neck: No lymphadenopathy. No thyromegaly or thyroid mass.  Cardiovascular: Regular rate and rhythm with no murmurs, gallops or rubs.  Pulmonary: Lungs clear  to auscultation in all fields bilaterally. Good air movement.  Abdomen: Soft, non-tender, non-distended, no massses or organomegaly  Extremities: No clubbing, cyanosis, or edema. and  No joint effusion.  Skin: Skin warm and dry and No rashes  Musculoskeletal: inspection is normal  Psychiatric: Normal affect, behavior, memory, thought content, judgement, and speech.  Neurology: normal without focal findings  mental status, speech normal, alert and oriented x iii             Laboratory Studies/Data Reviewed   I have reviewed all available and pertinent laboratory studies, imaging and health maintenance    Health Maintenance   Topic Date Due   . Pap smear  Never done   . Colonoscopy  Never done   . Diabetic Retinal Exam  03/14/2021   . Covid-19 Vaccine (3 - Booster for Pfizer series) 01/27/2022 (Originally 03/14/2020)   . Pneumococcal Vaccine, Age 66-64 (1 - PCV) 01/27/2022 (Originally 01/08/1967)   . Diabetes A1C  07/30/2021   . Adult Tdap-Td (2 - Td or Tdap) 06/13/2022   . Mammography  07/01/2022   . Annual Wellness Visit - Calendar Year Insurers  Completed   . Meningococcal Vaccine  Aged Out   . Hepatitis C screening  Discontinued   . HIV Screening  Discontinued   . Influenza Vaccine  Discontinued   . Shingles Vaccine  Discontinued            Assessment/Plan   Diagnosis    ICD-10-CM    1. PMB (postmenopausal bleeding)  N95.0 CASE REQUEST SURGICAL: HYSTEROSCOPY WITH DILATION AND CURETTAGE       Encounter Medications and Orders  Orders Placed This Encounter   . CASE REQUEST SURGICAL: HYSTEROSCOPY WITH DILATION AND CURETTAGE         Plan  Hysteroscopy D&C  Get copy of stress test  Stop NSAIDs 3 days before surgery  Take BP pill and inhalers the am of surgery, only    Return in about 4 weeks (around 04/29/2021) for post op.      Seward Meth, DO

## 2021-04-02 ENCOUNTER — Inpatient Hospital Stay (HOSPITAL_COMMUNITY): Admission: RE | Admit: 2021-04-02 | Payer: Medicare PPO | Source: Ambulatory Visit | Admitting: Obstetrics & Gynecology

## 2021-04-02 ENCOUNTER — Encounter (HOSPITAL_COMMUNITY): Payer: Self-pay | Admitting: Certified Registered"

## 2021-04-02 ENCOUNTER — Encounter (HOSPITAL_COMMUNITY): Admission: RE | Payer: Self-pay | Source: Ambulatory Visit

## 2021-04-02 SURGERY — HYSTEROSCOPY WITH DILATION AND CURETTAGE
Anesthesia: General

## 2021-04-03 LAB — URINE CULTURE

## 2021-04-06 ENCOUNTER — Encounter (HOSPITAL_COMMUNITY): Payer: Self-pay | Admitting: Obstetrics & Gynecology

## 2021-04-06 NOTE — Anesthesia Preprocedure Evaluation (Addendum)
ANESTHESIA PRE-OP EVALUATION  Planned Procedure: HYSTEROSCOPY WITH DILATION AND CURETTAGE (N/A )  Review of Systems     anesthesia history negative     patient summary reviewed  nursing notes reviewed        Pulmonary   COPD and asthma,   Cardiovascular    Hypertension, well controlled, ECG reviewed and Abdominal aortic atherosclerosis,  ,No peripheral edema,        GI/Hepatic/Renal    GERD        Endo/Other    hypothyroidism and morbid obesity,   type 2 diabetes/ stable/ controlled with oral medications    Neuro/Psych/MS    depression     Cancer    negative hematology/oncology ROS,                 Physical Assessment      Airway       Mallampati: III    TM distance: >3 FB    Neck ROM: full  Mouth Opening: good.  No Facial hair  No Beard  No endotracheal tube present  No Tracheostomy present    Dental           (+) edentulous           Pulmonary    Breath sounds clear to auscultation  (-) no rhonchi, no decreased breath sounds, no wheezes, no rales and no stridor     Cardiovascular    Rhythm: regular  Rate: Normal  (-) no friction rub, carotid bruit is not present, no peripheral edema and no murmur     Other findings            Plan  ASA 3     Planned anesthesia type: MAC                   PONV/POV Plan:  I plan to administer pharmcologic prophalaxis antiemetics  Intravenous induction     Anesthesia issues/risks discussed are: Sore Throat, Cardiac Events/MI, Post-op Intubation/Ventilation, Post-op Agitation/Tantrum, Difficult Airway, Blood Loss, Post-op Cognitive Dysfunction, PONV, Aspiration, Post-op Pain Management, Intraoperative Awareness/ Recall, Dental Injuries, Nerve Injuries and Stroke.  Anesthetic plan and risks discussed with patient  Signed consent obtained        Use of blood products discussed with patient who.     Patient's NPO status is appropriate for Anesthesia.           Plan discussed with physician.             Plan for Conscious Sedation after discussion. Holt

## 2021-04-07 ENCOUNTER — Encounter (HOSPITAL_COMMUNITY): Payer: Self-pay | Admitting: Obstetrics & Gynecology

## 2021-04-09 ENCOUNTER — Encounter (HOSPITAL_COMMUNITY)
Admission: RE | Disposition: A | Discharge status: Home / Self Care | Payer: Self-pay | Source: Ambulatory Visit | Attending: Obstetrics & Gynecology

## 2021-04-09 ENCOUNTER — Ambulatory Visit (HOSPITAL_COMMUNITY): Payer: Medicare PPO | Admitting: Certified Registered"

## 2021-04-09 ENCOUNTER — Other Ambulatory Visit: Payer: Self-pay

## 2021-04-09 ENCOUNTER — Inpatient Hospital Stay
Admission: RE | Admit: 2021-04-09 | Discharge: 2021-04-09 | Disposition: A | Discharge status: Home / Self Care | Payer: Medicare PPO | Source: Ambulatory Visit | Attending: Obstetrics & Gynecology | Admitting: Obstetrics & Gynecology

## 2021-04-09 DIAGNOSIS — N858 Other specified noninflammatory disorders of uterus: Secondary | ICD-10-CM | POA: Insufficient documentation

## 2021-04-09 DIAGNOSIS — N950 Postmenopausal bleeding: Secondary | ICD-10-CM | POA: Insufficient documentation

## 2021-04-09 HISTORY — DX: Chronic obstructive pulmonary disease, unspecified: J44.9

## 2021-04-09 SURGERY — HYSTEROSCOPY WITH DILATION AND CURETTAGE
Anesthesia: Monitor Anesthesia Care | Wound class: Clean Contaminated Wounds-The respiratory, GI, Genital, or urinary

## 2021-04-09 MED ORDER — MIDAZOLAM (PF) 1 MG/ML INJECTION SOLUTION
Freq: Once | INTRAMUSCULAR | Status: DC | PRN
Start: 2021-04-09 — End: 2021-04-09
  Administered 2021-04-09 (×2): 1 mg via INTRAVENOUS
  Administered 2021-04-09: 2 mg via INTRAVENOUS

## 2021-04-09 MED ORDER — BUPIVACAINE (PF) 0.25 % (2.5 MG/ML) INJECTION SOLUTION
Freq: Once | INTRAMUSCULAR | Status: DC | PRN
Start: 2021-04-09 — End: 2021-04-09
  Administered 2021-04-09: 10 mL

## 2021-04-09 MED ORDER — MIDAZOLAM 1 MG/ML INJECTION WRAPPER
INTRAMUSCULAR | Status: AC
Start: 2021-04-09 — End: 2021-04-09
  Filled 2021-04-09: qty 2

## 2021-04-09 MED ORDER — LIDOCAINE 20 MG/ML (2 %)-EPINEPHRINE 1:100,000 INJECTION SOLUTION
INTRAMUSCULAR | Status: AC
Start: 2021-04-09 — End: 2021-04-09
  Filled 2021-04-09: qty 20

## 2021-04-09 MED ORDER — KETAMINE 20 MG/2 ML (10 MG/ML) IN 0.9 % SODIUM CHLORIDE IV SYRINGE
INJECTION | INTRAVENOUS | Status: AC
Start: 2021-04-09 — End: 2021-04-09
  Filled 2021-04-09: qty 2

## 2021-04-09 MED ORDER — HYDROMORPHONE 1 MG/ML INJECTION SYRINGE
INJECTION | Freq: Once | INTRAMUSCULAR | Status: DC | PRN
Start: 2021-04-09 — End: 2021-04-09
  Administered 2021-04-09: .5 mg via INTRAVENOUS

## 2021-04-09 MED ORDER — HYDROMORPHONE (PF) 0.5 MG/0.5 ML INJECTION SYRINGE
INJECTION | INTRAMUSCULAR | Status: AC
Start: 2021-04-09 — End: 2021-04-09
  Filled 2021-04-09: qty 0.5

## 2021-04-09 MED ORDER — ONDANSETRON HCL (PF) 4 MG/2 ML INJECTION SOLUTION
INTRAMUSCULAR | Status: AC
Start: 2021-04-09 — End: 2021-04-09
  Filled 2021-04-09: qty 2

## 2021-04-09 MED ORDER — SODIUM CHLORIDE 0.9 % INTRAVENOUS SOLUTION
INTRAVENOUS | Status: DC | PRN
Start: 2021-04-09 — End: 2021-04-09

## 2021-04-09 MED ORDER — ONDANSETRON HCL (PF) 4 MG/2 ML INJECTION SOLUTION
Freq: Once | INTRAMUSCULAR | Status: DC | PRN
Start: 2021-04-09 — End: 2021-04-09
  Administered 2021-04-09: 4 mg via INTRAVENOUS

## 2021-04-09 MED ORDER — BUPIVACAINE (PF) 0.25 % (2.5 MG/ML) INJECTION SOLUTION
INTRAMUSCULAR | Status: AC
Start: 2021-04-09 — End: 2021-04-09
  Filled 2021-04-09: qty 30

## 2021-04-09 MED ORDER — OXYCODONE-ACETAMINOPHEN 5 MG-325 MG TABLET
1.0000 | ORAL_TABLET | Freq: Once | ORAL | Status: DC | PRN
Start: 2021-04-09 — End: 2021-04-09

## 2021-04-09 MED ORDER — LACTATED RINGERS INTRAVENOUS SOLUTION
INTRAVENOUS | Status: DC | PRN
Start: 2021-04-09 — End: 2021-04-09

## 2021-04-09 MED ORDER — ONDANSETRON HCL (PF) 4 MG/2 ML INJECTION SOLUTION
4.0000 mg | Freq: Once | INTRAMUSCULAR | Status: DC | PRN
Start: 2021-04-09 — End: 2021-04-09

## 2021-04-09 MED ORDER — KETAMINE 10 MG/ML INJECTION SOLUTION
Freq: Once | INTRAMUSCULAR | Status: DC | PRN
Start: 2021-04-09 — End: 2021-04-09
  Administered 2021-04-09: 5 mg via INTRAVENOUS
  Administered 2021-04-09 (×2): 10 mg via INTRAVENOUS
  Administered 2021-04-09: 5 mg via INTRAVENOUS
  Administered 2021-04-09: 10 mg via INTRAVENOUS

## 2021-04-09 SURGICAL SUPPLY — 25 items
CONTAINR CLICK N CLOSE POLYPROP 4OZ PNEUM TUBE SAFE LEAK PRF TMPR EVD SEAL CUP SCREW LID STRL SPECI (MISCELLANEOUS PT CARE ITEMS) ×2 IMPLANT
CONV USE ITEM 321835 - GLOVE SURG 6.5 LTX 3 PLY PF BE_AD CUF SMTH STRL BRN TINT (GLOVES AND ACCESSORIES) ×1 IMPLANT
CONV USE ITEM 321848 - GLOVE SURG 6 LF PF SMTH BEAD_CUF STRL NTR 12IN PROTEXIS PI (GLOVES AND ACCESSORIES) ×1 IMPLANT
CONV USE ITEM 88093 - STOCKING COMPRESS XL REG KNEE_LGTH HEEL PCKT OPN TOE 1 WY (MED SURG SUPPLIES) ×1 IMPLANT
CURETTE DIAG MCKESSON ARGENT 3MM ENDMTRL POLYPROP FLXB SNGL END HNDL STRL LF  DISP (MED SURG SUPPLIES) ×1 IMPLANT
DCNTR VIAL DISPENSR VLJT DISP STRL LF  IV ASPT TRANSF (IV TUBING & ACCESSORIES) ×1 IMPLANT
DISCONTINUED USE 162518 - PAD SANITARY CURITY HVY ABS MOIST BARRIER NWVN CVR MATERNITY SILK FLF DISP LF  NONST 12.25X4.25IN (MED SURG SUPPLIES) ×1 IMPLANT
DRAPE OB LITH UNDR BUTT FLUID COLLECT PCH SUCT PORT GRAD MRK ADH CLSR 44X40IN STRL SURG POLY CLR (DRAPE/PACKS/SHEETS/OR TOWEL) ×1 IMPLANT
DRESSING NON-ADHERENT 3X8_STRL (WOUND CARE/ENTEROSTOMAL SUPPLY) ×1
GOWN SURG XL L3 NONREINFORCE HKLP CLSR SET IN SLEEVE STRL LF  DISP BLU SIRUS SMS 47IN (DRAPE/PACKS/SHEETS/OR TOWEL) ×2 IMPLANT
JELLY LUB SRGLB FOILPAC BCTRST WATER SOL STRL CHG GLYC 5GM LF (MED SURG SUPPLIES) ×1 IMPLANT
JELLY LUB SRGLB FOILPAC BCTRST_H2O SOL STRL CHG GLYC 5GM LF (MED SURG SUPPLIES) ×1
LEGGINGS SURG 48X31IN CUF PRXM SMS 6IN STRL LF  DISP (DRAPE/PACKS/SHEETS/OR TOWEL) ×1 IMPLANT
NEEDLE HYPO  21GA 1.5IN TW SFGLD POLYPROP REG BVL LL SHIELD MECH DEHP-FR IM GRN STRL LF  DISP (MED SURG SUPPLIES) ×1 IMPLANT
NEEDLE SPINAL PNK 3.5IN 18GA QUINCKE REG WL POLYPROP QUINCKE TIP STRL LF  DISP (MED SURG SUPPLIES) ×1 IMPLANT
PACK SURG LITH II ECLIPSE STRL (CUSTOM TRAYS & PACK) ×1 IMPLANT
PAD DRESS 8X3IN MDCHC NONADH NWVN LF  STRL DISP WHT (WOUND CARE SUPPLY) ×1 IMPLANT
PAD SANITARY CURITY HVY ABS MOIST BARRIER NWVN CVR MATERNITY SILK FLF DISP LF  NONST 12.25X4.25IN (MED SURG SUPPLIES) ×1
PEN SURG MRKNG SKIN RLR LRG RESERVOIR REG TIP LBL VIOL STRL LF  6IN (MED SURG SUPPLIES) ×1 IMPLANT
SET IRRG CYSTO CONTINUOUS_OVERHEAD NONVENTED (IV TUBING & ACCESSORIES) ×1 IMPLANT
SOL IRRG 0.9% NACL 1000ML PLASTIC PR BTL ISTNC N-PYRG STRL LF (MEDICATIONS/SOLUTIONS) ×1 IMPLANT
SPONGE SURG 4X4IN 16 PLY XRY DETECT COTTON STRL LF  DISP (WOUND CARE SUPPLY) ×1 IMPLANT
SPONGE SURG 4X4IN 16 PLY_RADOPQ COT STRL LF DISP (WOUND CARE/ENTEROSTOMAL SUPPLY) ×1
SYRINGE LL 10ML LF  STRL CONTROL CONCEN TIP PRGN FREE DEHP-FR MED DISP (MED SURG SUPPLIES) ×1 IMPLANT
TRAY SKIN SCRUB VNYL WET SM WNG SPONGE STICK CTNTP APPL LF (MED SURG SUPPLIES) ×1 IMPLANT

## 2021-04-09 NOTE — Brief Op Note (Signed)
[facility]  BRIEF OPERATIVE NOTE  Patient:  Angela Frey  Age:  60 y.o.  Sex:  female  MRN:  [id number]  CSN:  [id number]  Date of Service:  04/01/2021  Date of Birth:  [birth year]    Procedure Performed:  Procedure(s):  HYSTEROSCOPY WITH DILATION AND CURETTAGE      Preoperative Diagnosis  PMB      Postoperative Diagnosis  PMB, pt declined in office procedure      Surgeon Name:    Francoise Ceo, DO      Anesthesia Type:    General      Estimated Blood Loss  1cc    Specimen(s):  Endometrial curetting    Complications:   None    Disposition: PACU - hemodynamically stable.  Condition: stable    Deliyah Muckle E Haward Pope, DO

## 2021-04-09 NOTE — OR Nursing (Signed)
PERI PAD IN PLACE

## 2021-04-09 NOTE — Interval H&P Note (Signed)
[facility]      H&P UPDATE FORM                                                                                  Tabby, Beaston, 60 y.o. female  Date of Admission:  04/09/2021  Date of Birth:  1961/03/02    04/09/2021    STOP: IF H&P IS GREATER THAN 30 DAYS FROM SURGICAL DAY COMPLETE NEW H&P IS REQUIRED.     H & P updated the day of the procedure.  1.  H&P completed within 30 days of surgical procedure and has been reviewed within 24 hours of admission but prior to surgery or a procedure requiring anesthesia services. The patient denies any ill signs or symptoms. The patient has been examined, and no change has occured in the patients condition since the H&P was completed.       Change in medications: No        No LMP recorded (lmp unknown). Patient is postmenopausal.          Comments:  Patient's actual stress test was over 5 years ago. Pt is on home O2 at night. No consult is seen from Pulmonology. Pt next cardiology apt is in November. Pt was referred in May. That was the first apt available.   Decision was made to offer pt procedure under conscious sedation. Pt agrees.     2.  Patient continues to be appropriate candidate for planned surgical procedure. YES      Letesha Klecker E Vonte Rossin, DO

## 2021-04-09 NOTE — Anesthesia Postprocedure Evaluation (Signed)
Anesthesia Post Op Evaluation    Patient: Angela Frey  Procedure(s):  HYSTEROSCOPY WITH DILATION AND CURETTAGE    Last Vitals:Temperature: 36.7 C (98.1 F) (04/09/21 1132)  Heart Rate: 81 (04/09/21 1132)  BP (Non-Invasive): (!) 142/78 (04/09/21 1132)  Respiratory Rate: 14 (04/09/21 1132)  SpO2: 98 % (04/09/21 1132)    No complications documented.    Patient is sufficiently recovered from the effects of anesthesia to participate in the evaluation and has returned to their pre-procedure level.  Patient location during evaluation: PACU       Patient participation: complete - patient participated  Level of consciousness: awake and alert and responsive to verbal stimuli    Pain score: 0  Pain management: adequate  Airway patency: patent    Anesthetic complications: no  Cardiovascular status: acceptable  Respiratory status: acceptable  Hydration status: acceptable  Patient post-procedure temperature: Pt Normothermic   PONV Status: Absent

## 2021-04-09 NOTE — OR Surgeon (Signed)
[facility]  OPERATIVE REPORT    Patient Name: [facility] Number: [id number]  Date of Service: 04/09/2021  Date of Birth: [birth year]      Pre-Operative Diagnosis: PMB    Post-Operative Diagnosis: same    Procedure(s)/Description:  Procedure(s):  HYSTEROSCOPY WITH DILATION AND CURETTAGE    Findings/Complexity (inherent to the procedure performed):    NA  Attending Surgeon: Seward Meth, DO    Assistant(s): None.    Anesthesia Type: Conscious sedation    Estimated Blood Loss:  1cc    Fluids Given:  1000 ml LR    Complications:  None  Tubes: None  Drains: None  Specimens/ Cultures: endometrial curetting           Disposition: PACU - hemodynamically stable.  Condition: stable      Description of Procedure:    After informed consent was obtained the patient was taken to the operating room and placed on the operating table in the dorsal lithotomy position with care taken to avoid excessive hip or knee adduction or abduction. Sequential compression devices were placed on the patient's lower extremities and activated prior to the induction of anesthesia. The patient was given IV sedation without difficulty. A surgical pause was performed identifying the patient and procedure to be done and all were in agreement.     Once patient was comfortable, a weighted speculum was placed in the patient's posterior vagina and the cervix was visualized.  Cervix was grasped horizontally on the anterior lip with a single tooth tenaculum.  The uterus sounded to 7 cm.  The cervix was dilated to accommodate the hysteroscope.  The hysteroscope was introduced and the endometrial and endocervical cavities were surveyed. There was a small amount of endometrium noted on the uterine walls, but did not appear grossly thickened.     The hysteroscope was removed and circumferential curettage was performed. The tissue was passed off for pathology.  The patient tolerated the procedure well.       All instruments were  removed from the patient's vagina. The tenaculum site was noted to be hemostatic. All counts were correct x 2 at the end of the procedure.     Seward Meth, DO 04/09/2021, 11:30    This note was partially generated using MModal Fluency Direct system, and there may be some incorrect words, spellings, and punctuation that were not noted in checking the note before saving.

## 2021-04-09 NOTE — Anesthesia Transfer of Care (Signed)
ANESTHESIA TRANSFER OF CARE   Angela Frey is a 60 y.o. ,female,     had Procedure(s):  HYSTEROSCOPY WITH DILATION AND CURETTAGE  performed  04/09/21   Primary Service: Seward Meth,*    Past Medical History:   Diagnosis Date   . Asthma    . Chronic obstructive airway disease (CMS HCC)    . Diabetes (CMS HCC)    . Hypertension    . Hypothyroid       Allergy History as of 04/09/21     CLINDAMYCIN       Noted Status Severity Type Reaction    09/10/19 1855 Marjie Skiff, NP 09/10/19 Active Low  Hives/ Urticaria          DOXYLAMIN-PSE-DM-ACETAMINOPHEN       Noted Status Severity Type Reaction    07/09/20 0731 Rodney Booze, LPN 54/00/86 Active Low  Hives/ Urticaria,  Other Adverse Reaction (Add comment)    09/10/19 1857 Marjie Skiff, NP 09/10/19 Active Low  Hives/ Urticaria          BENZONATATE       Noted Status Severity Type Reaction    02/20/20 1313 Rodney Booze, LPN 76/19/50 Active Low  Hives/ Urticaria          CLINDAMYCIN PHOSPHATE       Noted Status Severity Type Reaction    11/04/20 1619 Overmiller, Joesph Fillers, MD 11/04/20 Active                 I completed my transfer of care / handoff to the receiving personnel during which we discussed:  All key/critical aspects of case discussed and Gave opportunity for questions and acknowledgement of understanding    Post Location: Phase II                                                                  Last OR Temp: Temperature: 36.7 C (98.1 F)  ABG:  POTASSIUM   Date Value Ref Range Status   01/27/2021 5.2 (H) 3.5 - 5.1 mmol/L Final     KETONES   Date Value Ref Range Status   04/01/2021 Negative Negative mg/dL Final     CALCIUM   Date Value Ref Range Status   01/27/2021 10.2 8.4 - 10.5 mg/dL Final     Calculated P Axis   Date Value Ref Range Status   01/27/2021 70 degrees Final     Calculated R Axis   Date Value Ref Range Status   01/27/2021 15 degrees Final     Calculated T Axis   Date Value Ref Range Status   01/27/2021 57 degrees Final      Airway:* No LDAs found *  Blood pressure (!) 142/78, pulse 81, temperature 36.7 C (98.1 F), resp. rate 14, SpO2 98 %, not currently breastfeeding.

## 2021-04-09 NOTE — Discharge Instructions (Addendum)
SURGICAL DISCHARGE INSTRUCTIONS     Dr. Kessler, Michaela E, DO  performed your HYSTEROSCOPY WITH DILATION AND CURETTAGE today at the [facility]    [facility]:  Monday through Friday from 8 a.m. - 4 p.m.: [phone]  Between 4 p.m. - 8 a.m., weekends and holidays:  Call ER [phone]    PLEASE SEE WRITTEN HANDOUTS AS DISCUSSED BY YOUR NURSE:  [employer]. [employer], RN    SIGNS AND SYMPTOMS OF A WOUND / INCISION INFECTION   Be sure to watch for the following:  Increase in redness or red streaks near or around the wound or incision.  Increase in pain that is intense or severe and cannot be relieved by the pain medication that your doctor has given you.  Increase in swelling that cannot be relieved by elevation of a body part, or by applying ice, if permitted.  Increase in drainage, or if yellow / green in color and smells bad. This could be on a dressing or a cast.  Increase in fever for longer than 24 hours, or an increase that is higher than 101 degrees Fahrenheit (normal body temperature is 98 degrees Fahrenheit). The incision may feel warm to the touch.    **CALL YOUR DOCTOR IF ONE OR MORE OF THESE SIGNS / SYMPTOMS SHOULD OCCUR.    ANESTHESIA INFORMATION   ANESTHESIA -- ADULT PATIENTS:  You have received intravenous sedation / general anesthesia, and you may feel drowsy and light-headed for several hours. You may even experience some forgetfulness of the procedure. DO NOT DRIVE A MOTOR VEHICLE or perform any activity requiring complete alertness or coordination until you feel fully awake in about 24-48 hours. Do not drink alcoholic beverages for at least 24 hours. Do not stay alone, you must have a responsible adult available to be with you. You may also experience a dry mouth or nausea for 24 hours. This is a normal side effect and will disappear as the effects of the medication wear off.    REMEMBER   If you experience any difficulty breathing, chest pain, bleeding  that you feel is excessive, persistent nausea or vomiting or for any other concerns:  Call your physician Dr.  Kessler, Michaela E, DO   at [phone]. You may also ask to have the general doctor on call paged. They are available to you 24 hours a day.      SPECIAL INSTRUCTIONS / COMMENTS   AS PER DR. KESSLER    FOLLOW-UP APPOINTMENTS   Please call your surgeon's office at the number listed to schedule a date / time of return for follow-up.     Gynecology: Dr. Michaela Kessler [phone]   Monday through Friday 8:00 am - 4:00 pm

## 2021-04-09 NOTE — Nurses Notes (Signed)
PT ARRIVED IN OP AREA ON STRETCHER FROM OR.  PT IS AWAKE, ALERT, ORIENTED AND TALKING.  She ANSWERS QUESTIONS APPROPRIATELY.  PT IS SITTING IN CHAIR DRINKING A BEVERAGE WITHOUT PROBLEMS.  PT AND MOTHER WILL BE GIVEN DISCHARGE INSTRUCTIONS AND WILL BE DISCHARGED HOME IN STABLE CONDITION.

## 2021-04-09 NOTE — H&P (Signed)
Posted from clinic visit    [facility]  OB/GYN, [facility]  [address]  [place] [address]  [id number]    Angela Frey 04/01/2021   [id number] [birth year]     Chief Complaint: Other (Pre-op)      Subjective: Angela Frey is a 60 y.o. female presenting for Preop visit. Pt desires Hysteroscopy D&C under anesthesia. She is having PMB. [place] did not reveal any gross abnormalities. Urea/myco testing was neg.    Pt has chest CT in November 2021 revealing mild emphysema. EKG in July 2022 NSR.  Pt had event of bronchitis in July that caused her to have some tachycardia, which resolved with treatment of bronchitis.   Pt reports Normal stress test 3 years ago with Cariologist Dr Lady Gary in [facility].    Medical History   I have reviewed and updated as appropriate the past medical, family and social history today:    Medical History/Surgical History/Family History       Past Medical History:   Diagnosis Date   . Asthma    . Diabetes (CMS HCC)    . Hypertension    . Hypothyroid                 Past Surgical History:   Procedure Laterality Date   . HX TUBAL LIGATION     . LEG SURGERY            Family Medical History:    None                Menstrual History   . Total time span for birth control pills     . Use of HRT (how long total use)     . Use of Infertility Treatments     . Age of Menarche     . Avg. frequency (days)     . Duration (days)     . Flow     . Pattern     . Age of Menopause         OB History   No obstetric history on file.     Social History          Socioeconomic History   . Marital status: Unknown   Tobacco Use   . Smoking status: Former Games developer   . Smokeless tobacco: Never Used     Social Determinants of Health         Financial Resource Strain: Medium Risk   . Difficulty of Paying Living Expenses: Somewhat hard   Food Insecurity: No Food Insecurity   . Worried About Programme researcher, broadcasting/film/video in the Last Year: Never true   . Ran Out of Food in the  Last Year: Never true   Transportation Needs: No Transportation Needs   . Lack of Transportation (Medical): No   . Lack of Transportation (Non-Medical): No   Physical Activity: Inactive   . Days of Exercise per Week: 0 days   . Minutes of Exercise per Session: 0 min   Stress: Stress Concern Present   . Feeling of Stress : Rather much   Housing Stability: Unknown   . Unable to Pay for Housing in the Last Year: No   . Unstable Housing in the Last Year: No     Expanded Substance History       Allergies:       Allergies   Allergen Reactions   . Clindamycin Phosphate    . Cleocin [Clindamycin] Hives/ Urticaria   .  Doxylamin-Pse-Dm-Acetaminophen Hives/ Urticaria and  Other Adverse Reaction (Add comment)   . Tessalon [Benzonatate] Hives/ Urticaria     Medications:       Current Outpatient Medications   Medication Sig   . albuterol sulfate (PROVENTIL OR VENTOLIN OR PROAIR) 90 mcg/actuation Inhalation HFA Aerosol Inhaler Take 2 Puffs by inhalation Every 6 hours as needed   . albuterol sulfate (PROVENTIL) 2.5 mg /3 mL (0.083 %) Inhalation Solution for Nebulization Take 3 mL (2.5 mg total) by nebulization Four times a day as needed for Wheezing   . amitriptyline (ELAVIL) 25 mg Oral Tablet Take 25 mg by mouth Every night   . aspirin 81 mg Oral Tablet, Chewable Take 81 mg by mouth Once a day   . baclofen (LIORESAL) 10 mg Oral Tablet Take 1 Tablet (10 mg total) by mouth Three times a day   . Blood Sugar Diagnostic (ACCU-CHEK GUIDE TEST STRIPS) Strip 1 Strip by Does not apply route Four times a day   . Blood-Glucose Meter Misc 4 times per day   . calcium carbonate/vitamin D3 (CALCIUM 600 + D ORAL) Take by mouth Once a day   . chlorhexidine gluconate (PERIDEX) 0.12 % Mucous Membrane Mouthwash 15 mL swish and spit Twice daily   . cyanocobalamin (VITAMIN B 12) 1,000 mcg Oral Tablet Take 1,000 mcg by mouth Once a day   . esomeprazole magnesium (NEXIUM) 40 mg Oral Capsule, Delayed Release(E.C.) Take 1 Capsule (40 mg total) by  mouth Every morning before breakfast   . fluorouraciL (EFUDEX) 5 % Cream Apply topically Twice daily Apply to lesions as directed   . gabapentin (NEURONTIN) 400 mg Oral Capsule TAKE 1 CAPSULE BY MOUTH THREE TIMES DAILY   . guaiFENesin (MUCINEX) 600 mg Oral Tablet Extended Release 12hr Take 1 Tablet (600 mg total) by mouth Twice daily   . hydroCHLOROthiazide (HYDRODIURIL) 25 mg Oral Tablet Take 1 Tablet (25 mg total) by mouth Once a day   . ketoconazole (NIZORAL) 2 % Shampoo 3x per week 1/2 to 1 oz per shampoo   . Lactobac no.41/Bifidobact no.7 (PROBIOTIC-10 ORAL) Take by mouth Once a day   . Lancets (ACCU-CHEK SOFTCLIX LANCETS) Misc 1 Each by Does not apply route Four times a day   . levothyroxine (SYNTHROID) 175 mcg Oral Tablet TAKE 1 TABLET BY MOUTH IN THE MORNING   . lisinopriL (PRINIVIL) 20 mg Oral Tablet Take 1 Tablet (20 mg total) by mouth Twice daily   . Magnesium 250 mg Oral Tablet Take by mouth Once a day (Patient not taking: Reported on 04/01/2021)   . meloxicam (MOBIC) 15 mg Oral Tablet Take 1 Tablet (15 mg total) by mouth Once per day as needed for Pain   . metFORMIN (GLUCOPHAGE) 500 mg Oral Tablet Take 1 tablet by mouth twice daily with food   . Milk Thistle 500 mg Oral Capsule Take by mouth Twice daily   . montelukast (SINGULAIR) 10 mg Oral Tablet Take 1 Tablet (10 mg total) by mouth Every evening   . nystatin (MYCOSTATIN) 100,000 unit/gram Cream Apply 0.5 g t.i.d. to affected area.   . nystatin (NYSTOP) 100,000 unit/gram Powder by Apply Topically route Three times a day as needed   . oxybutynin (DITROPAN) 5 mg Oral Tablet  (Patient not taking: Reported on 04/01/2021)   . pediatric multivitamins Oral Tablet, Chewable Take 1 Tablet by mouth Once a day   . pravastatin (PRAVACHOL) 10 mg Oral Tablet TAKE 1 TABLET BY MOUTH ONCE DAILY IN THE EVENING   .  sertraline (ZOLOFT) 100 mg Oral Tablet Take 1 Tablet (100 mg total) by mouth Once a day   . terbinafine HCL (LAMISIL AT) 1 % Cream Apply topically Twice daily  Apply to nails twice daily with a q tip. Apply to bottoms of feet and toes twice daily for 2-4 weeks or until resolution   . tiotropium bromide (SPIRIVA HANDIHALER) 18 mcg Inhalation Capsule, w/Inhalation Device Take 1 Capsule (18 mcg total) by inhalation Once a day   . Urea 40 % 40 % Cream by Apply Topically route Twice daily Apply a pea sized amount with a q-tip to nails twice daily     Immunization History:       Immunization History   Administered Date(s) Administered   . Covid-19 Vaccine,Pfizer-BioNTech,Purple Top,73yrs+ 09/22/2019, 10/13/2019     Problem List:       Patient Active Problem List    Diagnosis Date Noted   . PMB (postmenopausal bleeding) 04/01/2021   . Abdominal aortic atherosclerosis (CMS HCC) 07/09/2020   . Mixed stress and urge urinary incontinence 02/28/2020   . Essential hypertension 02/20/2020   . Hypothyroidism due to Hashimoto's thyroiditis 02/20/2020   . Morbid obesity (CMS HCC) 02/20/2020   . Uncontrolled type 2 diabetes mellitus with hyperglycemia (CMS HCC) 02/20/2020   . Mild intermittent asthma without complication 02/20/2020   . Simple chronic bronchitis (CMS HCC) 02/20/2020   . Moderate major depression (CMS HCC) 02/20/2020   . Gastroesophageal reflux disease without esophagitis 02/20/2020   . Seborrheic dermatitis of scalp 02/20/2020   . Mixed hyperlipidemia 02/20/2020   . Primary insomnia 02/20/2020       Review of Systems:  Patient was asked about all of the following:  Constitutional: Weakness, fatigue, fever, chills, weight loss, weight gain, daytime sleeping.  Eyes: Glasses, blurred vision, double vision, burning, cataracts, vision changes, problems with bright lights, itching.  Ears, nose, mouth, throat, and face: Earache or drainage, hearing loss, hoarseness, nose bleeds, sinus problems, sore throat, ringing in the ears, nasal congestion.  Respiratory:  Shortness of breath, cough, coughing up blood, recent lung infection, snoring, wheezing, home breathing  treatment.  Cardiovascular:  Chest pain, swelling legs, heart trouble, palpitations, leg cramps, SOB with exertion, murmur.  Breast: No new breast lumps or masses  Gastrointestinal: Abdominal pain, black stool, constipation, diarrhea, heartburn, nausea, vomiting, swallowing problems.  Genitourinary:  Blood in urine, painful urination, increased frequency, leaking urine, recurrent UTI's, urination at night, painful periods, straining.  Integument/breast:  Rash, itching, color change of lesion, change in size of lesion, bleeding ulcer/lesion, new lesion, delayed healing.  Hematologic/lymphatic: Anemia, blood clots, easy bruising, hepatitis exposure, transfusions, swollen lymph nodes, bleeding problems.  Musculoskeletal: Back pain, joint pain, difficulty walking, cramps, trauma, weakness, joint swelling.  Neurological: Dizziness, headache, numbness, paralysis, seizures, stroke, memory loss.  Behavioral/Psych: Suicidal ideations, homicidal ideations, anxiety, depression, confusion, sleeping problems, stress, panic attacks, hallucinations.  Endocrine: Dry skin, excess thirst, hair loss, heat intolerance, cold intolerance, excess hair growth.    ROS neg except as mentioned in CC.    Objective   Vitals:  BP 112/68   Pulse 94   Temp 36.4 C (97.6 F)   Resp 18   Ht 1.626 m (5\' 4" )   Wt 101 kg (222 lb)   LMP  (LMP Unknown) Comment: stopped having periods at aget 44-46  SpO2 96%   Breastfeeding No   BMI 38.11 kg/m       Height: 162.6 cm (5\' 4" )      Wt  Readings from Last 5 Encounters:   04/01/21 101 kg (222 lb)   02/06/21 101 kg (223 lb 6.4 oz)   01/27/21 99.3 kg (219 lb)   01/16/21 98 kg (216 lb)   10/14/20 99 kg (218 lb 3.2 oz)     Body mass index is 38.11 kg/m.    Physical Exam:  General Appearance: appears in good health, moderately obese, appears stated age, no distress and vital signs reviewed  Head: NCAT  Eyes: lids and lashes normal, conjunctivae and sclerae normal and pupils equal, EOM intact  Neck:  No lymphadenopathy. No thyromegaly or thyroid mass.  Cardiovascular: Regular rate and rhythm with no murmurs, gallops or rubs.  Pulmonary: Lungs clear to auscultation in all fields bilaterally. Good air movement.  Abdomen: Soft, non-tender, non-distended, no massses or organomegaly  Extremities: No clubbing, cyanosis, or edema. and  No joint effusion.  Skin: Skin warm and dry and No rashes  Musculoskeletal: inspection is normal  Psychiatric: Normal affect, behavior, memory, thought content, judgement, and speech.  Neurology: normal without focal findings  mental status, speech normal, alert and oriented x iii             Laboratory Studies/Data Reviewed   I have reviewed all available and pertinent laboratory studies, imaging and health maintenance         Health Maintenance   Topic Date Due   . Pap smear  Never done   . Colonoscopy  Never done   . Diabetic Retinal Exam  03/14/2021   . Covid-19 Vaccine (3 - Booster for Pfizer series) 01/27/2022 (Originally 03/14/2020)   . Pneumococcal Vaccine, Age 86-64 (1 - PCV) 01/27/2022 (Originally 01/08/1967)   . Diabetes A1C  07/30/2021   . Adult Tdap-Td (2 - Td or Tdap) 06/13/2022   . Mammography  07/01/2022   . Annual Wellness Visit - Calendar Year Insurers  Completed   . Meningococcal Vaccine  Aged Out   . Hepatitis C screening  Discontinued   . HIV Screening  Discontinued   . Influenza Vaccine  Discontinued   . Shingles Vaccine  Discontinued            Assessment/Plan   Diagnosis    ICD-10-CM    1. PMB (postmenopausal bleeding)  N95.0 CASE REQUEST SURGICAL: HYSTEROSCOPY WITH DILATION AND CURETTAGE       Encounter Medications and Orders      Orders Placed This Encounter   . CASE REQUEST SURGICAL: HYSTEROSCOPY WITH DILATION AND CURETTAGE         Plan  Hysteroscopy D&C  Get copy of stress test  Stop NSAIDs 3 days before surgery  Take BP pill and inhalers the am of surgery, only    Return in about 4 weeks (around 04/29/2021) for post op.      Seward Meth,  DO

## 2021-04-13 DIAGNOSIS — N858 Other specified noninflammatory disorders of uterus: Secondary | ICD-10-CM

## 2021-04-13 LAB — SURGICAL PATHOLOGY SPECIMEN: Microscopic Description: NONE SEEN

## 2021-04-16 DIAGNOSIS — N950 Postmenopausal bleeding: Secondary | ICD-10-CM | POA: Insufficient documentation

## 2021-04-20 ENCOUNTER — Other Ambulatory Visit (HOSPITAL_BASED_OUTPATIENT_CLINIC_OR_DEPARTMENT_OTHER): Payer: Self-pay | Admitting: Obstetrics & Gynecology

## 2021-04-28 ENCOUNTER — Encounter (HOSPITAL_BASED_OUTPATIENT_CLINIC_OR_DEPARTMENT_OTHER): Payer: Self-pay | Admitting: Obstetrics & Gynecology

## 2021-04-28 ENCOUNTER — Ambulatory Visit (HOSPITAL_COMMUNITY): Payer: Medicare PPO

## 2021-04-28 ENCOUNTER — Ambulatory Visit: Payer: Medicare PPO | Attending: Obstetrics & Gynecology | Admitting: Obstetrics & Gynecology

## 2021-04-28 ENCOUNTER — Other Ambulatory Visit: Payer: Self-pay

## 2021-04-28 VITALS — BP 124/72 | HR 105 | Temp 98.6°F | Resp 18 | Ht 64.0 in | Wt 226.0 lb

## 2021-04-28 DIAGNOSIS — R399 Unspecified symptoms and signs involving the genitourinary system: Secondary | ICD-10-CM

## 2021-04-28 DIAGNOSIS — Z9889 Other specified postprocedural states: Secondary | ICD-10-CM | POA: Insufficient documentation

## 2021-04-28 DIAGNOSIS — J449 Chronic obstructive pulmonary disease, unspecified: Secondary | ICD-10-CM | POA: Insufficient documentation

## 2021-04-28 LAB — URINALYSIS, MACRO/MICRO
BILIRUBIN: NEGATIVE mg/dL
GLUCOSE: NEGATIVE mg/dL
KETONES: NEGATIVE mg/dL
NITRITE: POSITIVE — AB
PH: 7.5 (ref 4.6–8.0)
PROTEIN: NEGATIVE mg/dL
SPECIFIC GRAVITY: 1.02 (ref 1.005–1.030)
UROBILINOGEN: 0.2 mg/dL (ref 0.2–1.0)

## 2021-04-28 LAB — URINALYSIS, MICROSCOPIC: WBCS: >50 /hpf — AB

## 2021-04-28 MED ORDER — SULFAMETHOXAZOLE 800 MG-TRIMETHOPRIM 160 MG TABLET
1.0000 | ORAL_TABLET | Freq: Two times a day (BID) | ORAL | 0 refills | Status: DC
Start: 2021-04-28 — End: 2021-07-24
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
## 2021-04-28 NOTE — Progress Notes (Signed)
[facility]  OB/GYN, [facility]  [address]  [place] [address]  [phone]    Angela Frey 04/28/2021   [id number] [birth year]     Chief Complaint: Follow Up After Gyn Procedure and Other (Pt think possible UTI)      Subjective: Angela Frey is a 60 y.o. female presenting for f/u after Hysteroscopy D&C. Pathology was benign inactive endometrium.     Pt c/o increase urgency and LOU. She would like an abx and to have her urine tested.     Pt c/o DOE and decreased stamina. She has never had pulmonary rehab.       Medical History   I have reviewed and updated as appropriate the past medical, family and social history today:    Medical History/Surgical History/Family History  Past Medical History:   Diagnosis Date   . Asthma    . Chronic obstructive airway disease (CMS HCC)    . Diabetes (CMS HCC)    . Hypertension    . Hypothyroid           Past Surgical History:   Procedure Laterality Date   . HX PELVIC LAPAROSCOPY     . HX TONSILLECTOMY     . HX TUBAL LIGATION     . LEG SURGERY      2-3 surgeries   . NECK SURGERY     . TUBOPLASTY / TUBOTUBAL ANASTOMOSIS     . ULNAR NERVE REPAIR Bilateral           Family Medical History:    None          Menstrual History   . Total time span for birth control pills     . Use of HRT (how long total use)     . Use of Infertility Treatments     . Age of Menarche     . Avg. frequency (days)     . Duration (days)     . Flow     . Pattern     . Age of Menopause         OB History   Gravida Para Term Preterm AB Living   2 2 2     2    SAB IAB Ectopic Multiple Live Births                  # Outcome Date GA Lbr Len/2nd Weight Sex Delivery Anes PTL Lv   2 Term            1 Term              Social History     Socioeconomic History   . Marital status: Unknown   Tobacco Use   . Smoking status: Former   . Smokeless tobacco: Never   Vaping Use   . Vaping Use: Never used   Substance and Sexual Activity   . Alcohol use: Never   . Drug use: Never     Social  Determinants of Health     Financial Resource Strain: Medium Risk   . Difficulty of Paying Living Expenses: Somewhat hard   Food Insecurity: No Food Insecurity   . Worried About in the Last Year: Never true   . Ran Out of Food in the Last Year: Never true   Transportation Needs: No Transportation Needs   . Lack of Transportation (Medical): No   . Lack of Transportation (Non-Medical): No   Physical Activity: Inactive   .  Days of Exercise per Week: 0 days   . Minutes of Exercise per Session: 0 min   Stress: Stress Concern Present   . Feeling of Stress : Rather much   Housing Stability: Unknown   . Unable to Pay for Housing in the Last Year: No   . Unstable Housing in the Last Year: No     Expanded Substance History       Allergies:  Allergies   Allergen Reactions   . Clindamycin Phosphate    . Cleocin [Clindamycin] Hives/ Urticaria   . Doxylamin-Pse-Dm-Acetaminophen Hives/ Urticaria and  Other Adverse Reaction (Add comment)   . Tessalon [Benzonatate] Hives/ Urticaria     Medications:  Current Outpatient Medications   Medication Sig   . albuterol sulfate (PROVENTIL OR VENTOLIN OR PROAIR) 90 mcg/actuation Inhalation HFA Aerosol Inhaler Take 2 Puffs by inhalation Every 6 hours as needed   . albuterol sulfate (PROVENTIL) 2.5 mg /3 mL (0.083 %) Inhalation Solution for Nebulization Take 3 mL (2.5 mg total) by nebulization Four times a day as needed for Wheezing   . amitriptyline (ELAVIL) 25 mg Oral Tablet Take 25 mg by mouth Every night   . aspirin 81 mg Oral Tablet, Chewable Take 81 mg by mouth Once a day   . baclofen (LIORESAL) 10 mg Oral Tablet Take 1 Tablet (10 mg total) by mouth Three times a day   . Blood Sugar Diagnostic (ACCU-CHEK GUIDE TEST STRIPS) Strip 1 Strip by Does not apply route Four times a day   . Blood-Glucose Meter Misc 4 times per day   . calcium carbonate/vitamin D3 (CALCIUM 600 + D ORAL) Take by mouth Once a day   . chlorhexidine gluconate (PERIDEX) 0.12 % Mucous Membrane Mouthwash  15 mL swish and spit Twice daily   . cyanocobalamin (VITAMIN B 12) 1,000 mcg Oral Tablet Take 1,000 mcg by mouth Once a day   . esomeprazole magnesium (NEXIUM) 40 mg Oral Capsule, Delayed Release(E.C.) Take 1 Capsule (40 mg total) by mouth Every morning before breakfast   . fluorouraciL (EFUDEX) 5 % Cream Apply topically Twice daily Apply to lesions as directed   . gabapentin (NEURONTIN) 400 mg Oral Capsule TAKE 1 CAPSULE BY MOUTH THREE TIMES DAILY   . guaiFENesin (MUCINEX) 600 mg Oral Tablet Extended Release 12hr Take 1 Tablet (600 mg total) by mouth Twice daily   . hydroCHLOROthiazide (HYDRODIURIL) 25 mg Oral Tablet Take 1 Tablet (25 mg total) by mouth Once a day   . ketoconazole (NIZORAL) 2 % Shampoo 3x per week 1/2 to 1 oz per shampoo   . Lactobac no.41/Bifidobact no.7 (PROBIOTIC-10 ORAL) Take by mouth Once a day   . Lancets (ACCU-CHEK SOFTCLIX LANCETS) Misc 1 Each by Does not apply route Four times a day   . levothyroxine (SYNTHROID) 175 mcg Oral Tablet TAKE 1 TABLET BY MOUTH IN THE MORNING   . lisinopriL (PRINIVIL) 20 mg Oral Tablet Take 1 Tablet (20 mg total) by mouth Twice daily   . Magnesium 250 mg Oral Tablet Take by mouth Once a day   . meloxicam (MOBIC) 15 mg Oral Tablet Take 1 Tablet (15 mg total) by mouth Once per day as needed for Pain   . metFORMIN (GLUCOPHAGE) 500 mg Oral Tablet Take 1 tablet by mouth twice daily with food   . Milk Thistle 500 mg Oral Capsule Take by mouth Twice daily   . montelukast (SINGULAIR) 10 mg Oral Tablet Take 1 Tablet (10 mg total) by mouth Every evening   .  nystatin (MYCOSTATIN) 100,000 unit/gram Cream Apply 0.5 g t.i.d. to affected area.   . nystatin (NYSTOP) 100,000 unit/gram Powder by Apply Topically route Three times a day as needed   . oxybutynin (DITROPAN) 5 mg Oral Tablet    . pediatric multivitamins Oral Tablet, Chewable Take 1 Tablet by mouth Once a day   . pravastatin (PRAVACHOL) 10 mg Oral Tablet TAKE 1 TABLET BY MOUTH ONCE DAILY IN THE EVENING   . sertraline  (ZOLOFT) 100 mg Oral Tablet Take 1 Tablet (100 mg total) by mouth Once a day   . terbinafine HCL (LAMISIL AT) 1 % Cream Apply topically Twice daily Apply to nails twice daily with a q tip. Apply to bottoms of feet and toes twice daily for 2-4 weeks or until resolution   . tiotropium bromide (SPIRIVA HANDIHALER) 18 mcg Inhalation Capsule, w/Inhalation Device Take 1 Capsule (18 mcg total) by inhalation Once a day   . trimethoprim-sulfamethoxazole (BACTRIM DS) 160-800mg  per tablet Take 1 Tablet (160 mg total) by mouth Twice daily   . Urea 40 % 40 % Cream by Apply Topically route Twice daily Apply a pea sized amount with a q-tip to nails twice daily     Immunization History:  Immunization History   Administered Date(s) Administered   . Covid-19 Vaccine,Pfizer-BioNTech,Purple Top,28yrs+ 09/22/2019, 10/13/2019     Problem List:  Patient Active Problem List    Diagnosis Date Noted   . PMB (postmenopausal bleeding) 04/01/2021   . Abdominal aortic atherosclerosis (CMS HCC) 07/09/2020   . Mixed stress and urge urinary incontinence 02/28/2020   . Essential hypertension 02/20/2020   . Hypothyroidism due to Hashimoto's thyroiditis 02/20/2020   . Morbid obesity (CMS HCC) 02/20/2020   . Uncontrolled type 2 diabetes mellitus with hyperglycemia (CMS HCC) 02/20/2020   . Mild intermittent asthma without complication 02/20/2020   . Simple chronic bronchitis (CMS HCC) 02/20/2020   . Moderate major depression (CMS HCC) 02/20/2020   . Gastroesophageal reflux disease without esophagitis 02/20/2020   . Seborrheic dermatitis of scalp 02/20/2020   . Mixed hyperlipidemia 02/20/2020   . Primary insomnia 02/20/2020       Review of Systems:  Patient was asked about all of the following:  Constitutional: Weakness, fatigue, fever, chills, weight loss, weight gain, daytime sleeping.  Eyes: Glasses, blurred vision, double vision, burning, cataracts, vision changes, problems with bright lights, itching.  Ears, nose, mouth, throat, and face: Earache or  drainage, hearing loss, hoarseness, nose bleeds, sinus problems, sore throat, ringing in the ears, nasal congestion.  Respiratory:  Shortness of breath, cough, coughing up blood, recent lung infection, snoring, wheezing, home breathing treatment.  Cardiovascular:  Chest pain, swelling legs, heart trouble, palpitations, leg cramps, SOB with exertion, murmur.  Breast: Not specifically reviewed  Gastrointestinal: Abdominal pain, black stool, constipation, diarrhea, heartburn, nausea, vomiting, swallowing problems.  Genitourinary:  Blood in urine, painful urination, increased frequency, recurrent UTI's, urination at night, painful periods, straining.  Integument/breast:  Rash, itching, color change of lesion, change in size of lesion, bleeding ulcer/lesion, new lesion, delayed healing.  Hematologic/lymphatic: Anemia, blood clots, easy bruising, hepatitis exposure, transfusions, swollen lymph nodes, bleeding problems.  Musculoskeletal: Back pain, joint pain, difficulty walking, cramps, trauma, weakness, joint swelling.  Neurological: Dizziness, headache, numbness, paralysis, seizures, stroke, memory loss.  Behavioral/Psych: Suicidal ideations, homicidal ideations, anxiety, depression, confusion, sleeping problems, stress, panic attacks, hallucinations.  Endocrine: Dry skin, excess thirst, hair loss, heat intolerance, cold intolerance, excess hair growth.    ROS neg except as mentioned in  CC.    Objective   Vitals:  BP 124/72   Pulse (!) 105   Temp 37 C (98.6 F)   Resp 18   Ht 1.626 m (5\' 4" )   Wt 103 kg (226 lb)   LMP  (LMP Unknown) Comment: stopped having periods at aget 44-46  SpO2 96%   BMI 38.79 kg/m       Height: 162.6 cm (5\' 4" )  Wt Readings from Last 5 Encounters:   04/28/21 103 kg (226 lb)   04/01/21 101 kg (222 lb)   02/06/21 101 kg (223 lb 6.4 oz)   01/27/21 99.3 kg (219 lb)   01/16/21 98 kg (216 lb)     Body mass index is 38.79 kg/m.    Physical Exam:  General Appearance: appears in good health,  moderately obese, appears stated age, no distress and vital signs reviewed  Head: NCAT  Eyes: lids and lashes normal, conjunctivae and sclerae normal and pupils equal, EOM intact  Psychiatric: Normal affect, behavior, memory, thought content, judgement, and speech.  Neurology: normal without focal findings  mental status, speech normal, alert and oriented x iii             Laboratory Studies/Data Reviewed   I have reviewed all available and pertinent laboratory studies, imaging and health maintenance    Health Maintenance   Topic Date Due   . Pap smear  Never done   . Colonoscopy  Never done   . Diabetic Retinal Exam  03/14/2021   . Covid-19 Vaccine (3 - Booster for Pfizer series) 01/27/2022 (Originally 12/08/2019)   . Pneumococcal Vaccine, Age 96-64 (1 - PCV) 01/27/2022 (Originally 01/08/1967)   . Diabetes A1C  07/30/2021   . Adult Tdap-Td (2 - Td or Tdap) 06/13/2022   . Mammography  07/01/2022   . Annual Wellness Visit - Calendar Year Insurers  Completed   . Meningococcal Vaccine  Aged Out   . Depression Screening  Discontinued   . Hepatitis C screening  Discontinued   . HIV Screening  Discontinued   . Influenza Vaccine  Discontinued   . Shingles Vaccine  Discontinued          Results for orders placed or performed during the hospital encounter of 04/09/21 (from the past 672 hour(s))   SURGICAL PATHOLOGY SPECIMEN   Result Value Ref Range    Final Diagnosis       ENDOMETRIAL CURETTINGS:   INACTIVE/ATROPHIC ENDOMETRIUM.      Microscopic Description       No hyperplasia, dysplasia or tumor is seen.       Gross Description       A. Endometrium.  Formalin, 07/03/2022, endometrial curettage.  Present are a few scattered fragments of tan/brown tissue measuring 1 x 0.5 x less than 0.1 cm in aggregate, which are wrapped and entirely embedded.       Results for orders placed or performed in visit on 04/01/21 (from the past 672 hour(s))   URINE CULTURE    Specimen: Urine, Site not specified   Result Value Ref Range     URINE CULTURE Probable Contaminants, suggest recollection.    URINALYSIS WITH REFLEX MICROSCOPIC AND CULTURE IF POSITIVE    Specimen: Urine, Site not specified    Narrative    The following orders were created for panel order URINALYSIS WITH REFLEX MICROSCOPIC AND CULTURE IF POSITIVE.  Procedure  Abnormality         Status                     ---------                               -----------         ------                     URINALYSIS, MACRO/MICRO[[id number]]      Abnormal            Final result               URINALYSIS, MICROSCOPIC[[id number]]      Abnormal            Final result                 Please view results for these tests on the individual orders.   URINALYSIS, MACRO/MICRO   Result Value Ref Range    SPECIFIC GRAVITY 1.015 1.005 - 1.030    GLUCOSE Negative Negative mg/dL    PROTEIN Negative Negative mg/dL    BILIRUBIN Negative Negative mg/dL    UROBILINOGEN 0.2 0.2 - 1.0 mg/dL    PH 7.0 4.6 - 8.0    BLOOD Trace (A) Negative mg/dL    KETONES Negative Negative mg/dL    NITRITE Negative Negative    LEUKOCYTES Small (A) Negative WBCs/uL    APPEARANCE Slightly Hazy (A) Clear    COLOR Yellow Yellow   URINALYSIS, MICROSCOPIC   Result Value Ref Range    RBCS Rare (A) None, 0-2 /hpf    WBCS Rare None, Occasional, 0-2, 3-5, Rare /hpf    BACTERIA Trace Trace, None /hpf    OTHER URINE CRYSTALS Starch (A) None /hpf    SQUAMOUS EPITHELIAL CELLS Rare None, Occasional, 0-2, 3-5, 11-15, Rare /lpf          No results found for this or any previous visit (from the past 720 hour(s)).     Assessment/Plan   Diagnosis    ICD-10-CM    1. Post-operative state  Z98.890       2. UTI symptoms  R39.9 URINALYSIS WITH REFLEX MICROSCOPIC AND CULTURE IF POSITIVE      3. Chronic obstructive pulmonary disease, unspecified COPD type (CMS HCC)  J44.9 PULMONARY REHAB PHASE 2          Encounter Medications and Orders  Orders Placed This Encounter   . URINALYSIS WITH REFLEX MICROSCOPIC AND CULTURE IF POSITIVE   .  PULMONARY REHAB PHASE 2   . trimethoprim-sulfamethoxazole (BACTRIM DS) 160-800mg  per tablet         Plan  Refer to pulm rehab  Conservative monitoring  Bactrim rx sent  ua with reflex culture      Return for prn and annual.      Seward Meth, DO

## 2021-04-30 ENCOUNTER — Telehealth (HOSPITAL_BASED_OUTPATIENT_CLINIC_OR_DEPARTMENT_OTHER): Payer: Self-pay | Admitting: Obstetrics & Gynecology

## 2021-04-30 LAB — URINE CULTURE,ROUTINE: URINE CULTURE: >10000 — AB

## 2021-04-30 NOTE — Telephone Encounter (Signed)
Notify pt that she does have a UTI, take abx as directed. mk

## 2021-05-12 NOTE — Telephone Encounter (Signed)
LM on VM of mentioned, stated is she has any other questions or concerns to give our office a call back.

## 2021-05-17 ENCOUNTER — Other Ambulatory Visit (INDEPENDENT_AMBULATORY_CARE_PROVIDER_SITE_OTHER): Payer: Self-pay | Admitting: FAMILY MEDICINE

## 2021-05-17 DIAGNOSIS — L219 Seborrheic dermatitis, unspecified: Secondary | ICD-10-CM

## 2021-05-27 ENCOUNTER — Other Ambulatory Visit (INDEPENDENT_AMBULATORY_CARE_PROVIDER_SITE_OTHER): Payer: Self-pay | Admitting: FAMILY MEDICINE

## 2021-05-27 DIAGNOSIS — E038 Other specified hypothyroidism: Secondary | ICD-10-CM

## 2021-05-27 DIAGNOSIS — E063 Autoimmune thyroiditis: Secondary | ICD-10-CM

## 2021-06-15 ENCOUNTER — Other Ambulatory Visit (INDEPENDENT_AMBULATORY_CARE_PROVIDER_SITE_OTHER): Payer: Self-pay | Admitting: FAMILY MEDICINE

## 2021-06-15 DIAGNOSIS — E1165 Type 2 diabetes mellitus with hyperglycemia: Secondary | ICD-10-CM

## 2021-06-15 DIAGNOSIS — E782 Mixed hyperlipidemia: Secondary | ICD-10-CM

## 2021-06-20 ENCOUNTER — Other Ambulatory Visit (INDEPENDENT_AMBULATORY_CARE_PROVIDER_SITE_OTHER): Payer: Self-pay | Admitting: FAMILY MEDICINE

## 2021-06-26 ENCOUNTER — Other Ambulatory Visit (INDEPENDENT_AMBULATORY_CARE_PROVIDER_SITE_OTHER): Payer: Self-pay | Admitting: FAMILY MEDICINE

## 2021-06-30 ENCOUNTER — Telehealth (INDEPENDENT_AMBULATORY_CARE_PROVIDER_SITE_OTHER): Payer: Self-pay | Admitting: FAMILY MEDICINE

## 2021-06-30 NOTE — Telephone Encounter (Signed)
Patient called in and stated she needs a refill on all her medications, She stated that she had left previous messages but The only other message in her chart stated she needed a refill on Nexium which was sent, but she stated she needs all new prescriptions per the pharmacy on all her medications.      [name], Unit Clerk  06/30/2021, 16:20

## 2021-07-01 ENCOUNTER — Other Ambulatory Visit (INDEPENDENT_AMBULATORY_CARE_PROVIDER_SITE_OTHER): Payer: Self-pay | Admitting: FAMILY MEDICINE

## 2021-07-01 DIAGNOSIS — M62838 Other muscle spasm: Secondary | ICD-10-CM

## 2021-07-01 DIAGNOSIS — F321 Major depressive disorder, single episode, moderate: Secondary | ICD-10-CM

## 2021-07-01 DIAGNOSIS — M792 Neuralgia and neuritis, unspecified: Secondary | ICD-10-CM

## 2021-07-01 DIAGNOSIS — M545 Low back pain, unspecified: Secondary | ICD-10-CM

## 2021-07-01 DIAGNOSIS — J410 Simple chronic bronchitis: Secondary | ICD-10-CM

## 2021-07-01 DIAGNOSIS — E1165 Type 2 diabetes mellitus with hyperglycemia: Secondary | ICD-10-CM

## 2021-07-01 DIAGNOSIS — I10 Essential (primary) hypertension: Secondary | ICD-10-CM

## 2021-07-01 MED ORDER — TIOTROPIUM BROMIDE 18 MCG CAPSULE WITH INHALATION DEVICE
18.0000 ug | ORAL_CAPSULE | Freq: Every day | RESPIRATORY_TRACT | 5 refills | Status: DC
Start: 2021-07-01 — End: 2022-03-02

## 2021-07-01 MED ORDER — ALBUTEROL SULFATE 2.5 MG/3 ML (0.083 %) SOLUTION FOR NEBULIZATION
2.5000 mg | INHALATION_SOLUTION | Freq: Four times a day (QID) | RESPIRATORY_TRACT | 5 refills | Status: DC | PRN
Start: 2021-07-01 — End: 2021-10-21

## 2021-07-01 MED ORDER — SERTRALINE 100 MG TABLET
100.0000 mg | ORAL_TABLET | Freq: Every day | ORAL | 1 refills | Status: DC
Start: 2021-07-01 — End: 2021-07-10

## 2021-07-01 MED ORDER — BACLOFEN 10 MG TABLET
10.0000 mg | ORAL_TABLET | Freq: Three times a day (TID) | ORAL | 5 refills | Status: DC
Start: 2021-07-01 — End: 2021-07-24

## 2021-07-01 MED ORDER — AMITRIPTYLINE 25 MG TABLET
25.0000 mg | ORAL_TABLET | Freq: Every evening | ORAL | 2 refills | Status: DC
Start: 2021-07-01 — End: 2022-03-02

## 2021-07-01 MED ORDER — METFORMIN 500 MG TABLET
500.0000 mg | ORAL_TABLET | Freq: Two times a day (BID) | ORAL | 1 refills | Status: DC
Start: 2021-07-01 — End: 2022-03-02

## 2021-07-01 MED ORDER — MELOXICAM 15 MG TABLET
15.0000 mg | ORAL_TABLET | Freq: Every day | ORAL | 1 refills | Status: DC | PRN
Start: 2021-07-01 — End: 2022-03-02

## 2021-07-01 MED ORDER — GABAPENTIN 400 MG CAPSULE
400.0000 mg | ORAL_CAPSULE | Freq: Three times a day (TID) | ORAL | 1 refills | Status: DC
Start: 2021-07-01 — End: 2022-01-24

## 2021-07-01 MED ORDER — LISINOPRIL 20 MG TABLET
20.0000 mg | ORAL_TABLET | Freq: Two times a day (BID) | ORAL | 1 refills | Status: DC
Start: 2021-07-01 — End: 2022-01-24

## 2021-07-01 MED ORDER — MONTELUKAST 10 MG TABLET
10.0000 mg | ORAL_TABLET | Freq: Every evening | ORAL | 1 refills | Status: DC
Start: 2021-07-01 — End: 2022-03-02

## 2021-07-01 MED ORDER — OXYBUTYNIN CHLORIDE 5 MG TABLET
5.0000 mg | ORAL_TABLET | Freq: Every day | ORAL | 1 refills | Status: DC
Start: 2021-07-01 — End: 2021-09-07

## 2021-07-01 MED ORDER — HYDROCHLOROTHIAZIDE 25 MG TABLET
25.0000 mg | ORAL_TABLET | Freq: Every day | ORAL | 1 refills | Status: DC
Start: 2021-07-01 — End: 2021-07-10

## 2021-07-09 ENCOUNTER — Other Ambulatory Visit (INDEPENDENT_AMBULATORY_CARE_PROVIDER_SITE_OTHER): Payer: Self-pay | Admitting: FAMILY MEDICINE

## 2021-07-09 DIAGNOSIS — F321 Major depressive disorder, single episode, moderate: Secondary | ICD-10-CM

## 2021-07-09 DIAGNOSIS — I10 Essential (primary) hypertension: Secondary | ICD-10-CM

## 2021-07-24 ENCOUNTER — Encounter (INDEPENDENT_AMBULATORY_CARE_PROVIDER_SITE_OTHER): Payer: Self-pay | Admitting: FAMILY MEDICINE

## 2021-07-24 ENCOUNTER — Other Ambulatory Visit: Payer: Self-pay

## 2021-07-24 ENCOUNTER — Ambulatory Visit: Payer: Medicare PPO | Attending: FAMILY MEDICINE | Admitting: FAMILY MEDICINE

## 2021-07-24 VITALS — BP 133/79 | HR 92 | Temp 97.1°F | Resp 17 | Ht 64.0 in | Wt 224.4 lb

## 2021-07-24 DIAGNOSIS — E1165 Type 2 diabetes mellitus with hyperglycemia: Secondary | ICD-10-CM | POA: Insufficient documentation

## 2021-07-24 DIAGNOSIS — I10 Essential (primary) hypertension: Secondary | ICD-10-CM | POA: Insufficient documentation

## 2021-07-24 DIAGNOSIS — I700 Atherosclerosis of aorta: Secondary | ICD-10-CM | POA: Insufficient documentation

## 2021-07-24 DIAGNOSIS — J449 Chronic obstructive pulmonary disease, unspecified: Secondary | ICD-10-CM | POA: Insufficient documentation

## 2021-07-24 DIAGNOSIS — M62838 Other muscle spasm: Secondary | ICD-10-CM | POA: Insufficient documentation

## 2021-07-24 DIAGNOSIS — B372 Candidiasis of skin and nail: Secondary | ICD-10-CM | POA: Insufficient documentation

## 2021-07-24 DIAGNOSIS — E038 Other specified hypothyroidism: Secondary | ICD-10-CM | POA: Insufficient documentation

## 2021-07-24 DIAGNOSIS — E063 Autoimmune thyroiditis: Secondary | ICD-10-CM | POA: Insufficient documentation

## 2021-07-24 DIAGNOSIS — E782 Mixed hyperlipidemia: Secondary | ICD-10-CM | POA: Insufficient documentation

## 2021-07-24 DIAGNOSIS — F321 Major depressive disorder, single episode, moderate: Secondary | ICD-10-CM | POA: Insufficient documentation

## 2021-07-24 DIAGNOSIS — E039 Hypothyroidism, unspecified: Secondary | ICD-10-CM | POA: Insufficient documentation

## 2021-07-24 MED ORDER — NYSTATIN 100,000 UNIT/GRAM TOPICAL CREAM
TOPICAL_CREAM | CUTANEOUS | 2 refills | Status: DC
Start: 2021-07-24 — End: 2023-10-13

## 2021-07-24 MED ORDER — SERTRALINE 100 MG TABLET
100.0000 mg | ORAL_TABLET | Freq: Every day | ORAL | 1 refills | Status: DC
Start: 2021-07-24 — End: 2022-03-02

## 2021-07-24 MED ORDER — BACLOFEN 10 MG TABLET
10.0000 mg | ORAL_TABLET | Freq: Four times a day (QID) | ORAL | 5 refills | Status: DC
Start: 2021-07-24 — End: 2022-03-02

## 2021-07-24 MED ORDER — LEVOTHYROXINE 175 MCG TABLET
175.0000 ug | ORAL_TABLET | Freq: Every morning | ORAL | 1 refills | Status: DC
Start: 2021-07-24 — End: 2022-01-24
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
## 2021-07-24 NOTE — Nursing Note (Signed)
07/24/21 1600   PHQ 9 (follow up)   Little interest or pleasure in doing things. 0   Feeling down, depressed, or hopeless 0   Interpretation of Total Score No depression
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
## 2021-07-24 NOTE — Nursing Note (Signed)
07/24/21 1600   Fall Risk Assessment   Do you feel unsteady when standing or walking? No   Do you worry about falling? Yes   Have you fallen in the past year? Yes   How many times have you fallen? 2 or more times   Were you ever injured from falling? No   Timed up and go test (in seconds) 14

## 2021-08-18 ENCOUNTER — Telehealth (INDEPENDENT_AMBULATORY_CARE_PROVIDER_SITE_OTHER): Payer: Self-pay | Admitting: FAMILY MEDICINE

## 2021-08-18 ENCOUNTER — Other Ambulatory Visit (INDEPENDENT_AMBULATORY_CARE_PROVIDER_SITE_OTHER): Payer: Self-pay | Admitting: FAMILY MEDICINE

## 2021-08-18 DIAGNOSIS — B354 Tinea corporis: Secondary | ICD-10-CM

## 2021-08-18 MED ORDER — TERBINAFINE HCL 1 % TOPICAL CREAM
TOPICAL_CREAM | Freq: Two times a day (BID) | CUTANEOUS | 1 refills | Status: AC
Start: 2021-08-18 — End: 2021-09-01

## 2021-08-18 NOTE — Telephone Encounter (Signed)
Angela Frey called in and lvm stating she has a fungal infection/rash under the flap of her stomach and she was wondering if you could call in an antifungal cream for her?  She stated she has tried nystatin cream and powder without relief.    [name], Unit Clerk  08/18/2021, 06:53

## 2021-09-06 ENCOUNTER — Other Ambulatory Visit (INDEPENDENT_AMBULATORY_CARE_PROVIDER_SITE_OTHER): Payer: Self-pay | Admitting: FAMILY MEDICINE

## 2021-09-06 DIAGNOSIS — E782 Mixed hyperlipidemia: Secondary | ICD-10-CM

## 2021-09-06 DIAGNOSIS — E1165 Type 2 diabetes mellitus with hyperglycemia: Secondary | ICD-10-CM

## 2021-09-13 ENCOUNTER — Other Ambulatory Visit (INDEPENDENT_AMBULATORY_CARE_PROVIDER_SITE_OTHER): Payer: Self-pay | Admitting: FAMILY MEDICINE

## 2021-09-13 DIAGNOSIS — E782 Mixed hyperlipidemia: Secondary | ICD-10-CM

## 2021-09-13 DIAGNOSIS — E1165 Type 2 diabetes mellitus with hyperglycemia: Secondary | ICD-10-CM

## 2021-10-05 ENCOUNTER — Other Ambulatory Visit: Payer: Self-pay

## 2021-10-05 ENCOUNTER — Other Ambulatory Visit: Payer: Medicare PPO | Attending: FAMILY MEDICINE

## 2021-10-05 DIAGNOSIS — E1165 Type 2 diabetes mellitus with hyperglycemia: Secondary | ICD-10-CM | POA: Insufficient documentation

## 2021-10-05 DIAGNOSIS — E782 Mixed hyperlipidemia: Secondary | ICD-10-CM | POA: Insufficient documentation

## 2021-10-05 DIAGNOSIS — E039 Hypothyroidism, unspecified: Secondary | ICD-10-CM | POA: Insufficient documentation

## 2021-10-05 DIAGNOSIS — I10 Essential (primary) hypertension: Secondary | ICD-10-CM | POA: Insufficient documentation

## 2021-10-05 LAB — COMPREHENSIVE METABOLIC PNL, FASTING
ALBUMIN: 3.7 g/dL (ref 3.4–4.8)
ALKALINE PHOSPHATASE: 103 U/L (ref 50–130)
ALT (SGPT): 31 U/L — ABNORMAL HIGH (ref 8–22)
ANION GAP: 10 mmol/L (ref 4–13)
AST (SGOT): 44 U/L (ref 8–45)
BILIRUBIN TOTAL: 0.6 mg/dL (ref 0.3–1.3)
BUN/CREA RATIO: 12 (ref 6–22)
BUN: 10 mg/dL (ref 8–25)
CALCIUM: 9.9 mg/dL (ref 8.8–10.2)
CHLORIDE: 104 mmol/L (ref 96–111)
CO2 TOTAL: 29 mmol/L (ref 23–31)
CREATININE: 0.82 mg/dL (ref 0.60–1.05)
ESTIMATED GFR: 82 mL/min/BSA (ref >=60)
GLUCOSE: 97 mg/dL (ref 70–99)
POTASSIUM: 4 mmol/L (ref 3.5–5.1)
PROTEIN TOTAL: 7.4 g/dL (ref 6.0–8.0)
SODIUM: 143 mmol/L (ref 136–145)

## 2021-10-05 LAB — CBC WITH DIFF
BASOPHIL #: <0.1 10*3/uL (ref <=0.20)
BASOPHIL %: 0 %
EOSINOPHIL #: 0.37 10*3/uL (ref <=0.50)
EOSINOPHIL %: 7 %
HCT: 33.1 % — ABNORMAL LOW (ref 34.8–46.0)
HGB: 10.1 g/dL — ABNORMAL LOW (ref 11.5–16.0)
IMMATURE GRANULOCYTE #: <0.1 10*3/uL (ref <0.10)
IMMATURE GRANULOCYTE %: 0 % (ref 0–1)
LYMPHOCYTE #: 1.4 10*3/uL (ref 1.00–4.80)
LYMPHOCYTE %: 25 %
MCH: 25.8 pg — ABNORMAL LOW (ref 26.0–32.0)
MCHC: 30.5 g/dL — ABNORMAL LOW (ref 31.0–35.5)
MCV: 84.7 fL (ref 78.0–100.0)
MONOCYTE #: 0.47 10*3/uL (ref 0.20–1.10)
MONOCYTE %: 9 %
MPV: 12.7 fL — ABNORMAL HIGH (ref 8.7–12.5)
NEUTROPHIL #: 3.28 10*3/uL (ref 1.50–7.70)
NEUTROPHIL %: 59 %
PLATELETS: 132 10*3/uL — ABNORMAL LOW (ref 150–400)
RBC: 3.91 10*6/uL (ref 3.85–5.22)
RDW-CV: 16.5 % — ABNORMAL HIGH (ref 11.5–15.5)
WBC: 5.6 10*3/uL (ref 3.7–11.0)

## 2021-10-05 LAB — HGA1C (HEMOGLOBIN A1C WITH EST AVG GLUCOSE)
ESTIMATED AVERAGE GLUCOSE: 151 mg/dL
HEMOGLOBIN A1C: 6.9 % — ABNORMAL HIGH (ref <=5.7)

## 2021-10-05 LAB — LIPID PANEL
CHOL/HDL RATIO: 3.9
CHOLESTEROL: 158 mg/dL (ref 100–200)
HDL CHOL: 41 mg/dL — ABNORMAL LOW (ref >=50)
LDL CALC: 89 mg/dL (ref <100)
NON-HDL: 117 mg/dL (ref <=190)
TRIGLYCERIDES: 159 mg/dL — ABNORMAL HIGH (ref <150)
VLDL CALC: 25 mg/dL (ref <30)

## 2021-10-05 LAB — THYROID STIMULATING HORMONE (SENSITIVE TSH): TSH: 1.701 u[IU]/mL (ref 0.430–3.550)

## 2021-10-11 ENCOUNTER — Other Ambulatory Visit (INDEPENDENT_AMBULATORY_CARE_PROVIDER_SITE_OTHER): Payer: Self-pay | Admitting: FAMILY MEDICINE

## 2021-10-17 ENCOUNTER — Other Ambulatory Visit: Payer: Self-pay

## 2021-10-17 ENCOUNTER — Other Ambulatory Visit: Payer: Medicare PPO | Attending: FAMILY MEDICINE

## 2021-10-17 DIAGNOSIS — E1165 Type 2 diabetes mellitus with hyperglycemia: Secondary | ICD-10-CM | POA: Insufficient documentation

## 2021-10-17 LAB — MICROALBUMIN/CREATININE RATIO, URINE, RANDOM
CREATININE RANDOM URINE: 97 mg/dL (ref 50–100)
MICROALBUMIN RANDOM URINE: 0.7 mg/dL
MICROALBUMIN/CREATININE RATIO RANDOM URINE: 7.2 mg/g (ref <30.0)

## 2021-10-21 ENCOUNTER — Other Ambulatory Visit: Payer: Self-pay

## 2021-10-21 ENCOUNTER — Telehealth (INDEPENDENT_AMBULATORY_CARE_PROVIDER_SITE_OTHER): Payer: Self-pay | Admitting: FAMILY MEDICINE

## 2021-10-21 ENCOUNTER — Ambulatory Visit: Payer: Medicare PPO | Attending: FAMILY MEDICINE | Admitting: FAMILY MEDICINE

## 2021-10-21 ENCOUNTER — Encounter (INDEPENDENT_AMBULATORY_CARE_PROVIDER_SITE_OTHER): Payer: Self-pay | Admitting: FAMILY MEDICINE

## 2021-10-21 VITALS — BP 118/62 | HR 88 | Resp 18 | Ht 64.0 in | Wt 224.2 lb

## 2021-10-21 DIAGNOSIS — Z7984 Long term (current) use of oral hypoglycemic drugs: Secondary | ICD-10-CM | POA: Insufficient documentation

## 2021-10-21 DIAGNOSIS — J449 Chronic obstructive pulmonary disease, unspecified: Secondary | ICD-10-CM | POA: Insufficient documentation

## 2021-10-21 DIAGNOSIS — Z79899 Other long term (current) drug therapy: Secondary | ICD-10-CM | POA: Insufficient documentation

## 2021-10-21 DIAGNOSIS — M79605 Pain in left leg: Secondary | ICD-10-CM | POA: Insufficient documentation

## 2021-10-21 DIAGNOSIS — Z8051 Family history of malignant neoplasm of kidney: Secondary | ICD-10-CM | POA: Insufficient documentation

## 2021-10-21 DIAGNOSIS — I10 Essential (primary) hypertension: Secondary | ICD-10-CM | POA: Insufficient documentation

## 2021-10-21 DIAGNOSIS — E038 Other specified hypothyroidism: Secondary | ICD-10-CM | POA: Insufficient documentation

## 2021-10-21 DIAGNOSIS — E063 Autoimmune thyroiditis: Secondary | ICD-10-CM | POA: Insufficient documentation

## 2021-10-21 DIAGNOSIS — L03311 Cellulitis of abdominal wall: Secondary | ICD-10-CM | POA: Insufficient documentation

## 2021-10-21 DIAGNOSIS — E1165 Type 2 diabetes mellitus with hyperglycemia: Secondary | ICD-10-CM | POA: Insufficient documentation

## 2021-10-21 DIAGNOSIS — E782 Mixed hyperlipidemia: Secondary | ICD-10-CM | POA: Insufficient documentation

## 2021-10-21 DIAGNOSIS — Z87891 Personal history of nicotine dependence: Secondary | ICD-10-CM | POA: Insufficient documentation

## 2021-10-21 DIAGNOSIS — H579 Unspecified disorder of eye and adnexa: Secondary | ICD-10-CM | POA: Insufficient documentation

## 2021-10-21 DIAGNOSIS — R262 Difficulty in walking, not elsewhere classified: Secondary | ICD-10-CM | POA: Insufficient documentation

## 2021-10-21 DIAGNOSIS — Z6838 Body mass index (BMI) 38.0-38.9, adult: Secondary | ICD-10-CM | POA: Insufficient documentation

## 2021-10-21 DIAGNOSIS — L0390 Cellulitis, unspecified: Secondary | ICD-10-CM | POA: Insufficient documentation

## 2021-10-21 DIAGNOSIS — B372 Candidiasis of skin and nail: Secondary | ICD-10-CM | POA: Insufficient documentation

## 2021-10-21 MED ORDER — FLUCONAZOLE 150 MG TABLET
ORAL_TABLET | ORAL | 0 refills | Status: DC
Start: 2021-10-21 — End: 2022-03-02

## 2021-10-21 MED ORDER — RYBELSUS 3 MG TABLET
3.0000 mg | ORAL_TABLET | Freq: Every day | ORAL | 0 refills | Status: DC
Start: 2021-10-21 — End: 2022-09-03

## 2021-10-21 MED ORDER — ALBUTEROL SULFATE 2.5 MG/3 ML (0.083 %) SOLUTION FOR NEBULIZATION
2.5000 mg | INHALATION_SOLUTION | Freq: Four times a day (QID) | RESPIRATORY_TRACT | 5 refills | Status: DC | PRN
Start: 2021-10-21 — End: 2022-03-02

## 2021-10-21 MED ORDER — HYDROCHLOROTHIAZIDE 25 MG TABLET
25.0000 mg | ORAL_TABLET | Freq: Every day | ORAL | 1 refills | Status: DC
Start: 2021-10-21 — End: 2022-03-02

## 2021-10-21 MED ORDER — DOXYCYCLINE HYCLATE 100 MG CAPSULE
100.0000 mg | ORAL_CAPSULE | Freq: Two times a day (BID) | ORAL | 0 refills | Status: AC
Start: 2021-10-21 — End: 2021-10-28
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
## 2021-10-21 NOTE — Telephone Encounter (Signed)
Please fax order for DME scooter to [place] or [place]
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
## 2021-11-23 ENCOUNTER — Other Ambulatory Visit (HOSPITAL_COMMUNITY): Payer: Self-pay | Admitting: Orthopaedic Surgery

## 2021-11-23 DIAGNOSIS — M25512 Pain in left shoulder: Secondary | ICD-10-CM

## 2021-11-26 ENCOUNTER — Ambulatory Visit (HOSPITAL_COMMUNITY): Payer: Self-pay

## 2021-12-10 ENCOUNTER — Ambulatory Visit (HOSPITAL_COMMUNITY): Payer: Self-pay

## 2021-12-11 ENCOUNTER — Other Ambulatory Visit: Payer: Self-pay

## 2021-12-11 ENCOUNTER — Inpatient Hospital Stay
Admission: RE | Admit: 2021-12-11 | Discharge: 2021-12-11 | Disposition: A | Discharge status: Home / Self Care | Payer: Medicare PPO | Source: Ambulatory Visit | Attending: Orthopaedic Surgery | Admitting: Orthopaedic Surgery

## 2021-12-11 DIAGNOSIS — M25512 Pain in left shoulder: Secondary | ICD-10-CM | POA: Insufficient documentation

## 2021-12-13 ENCOUNTER — Other Ambulatory Visit (INDEPENDENT_AMBULATORY_CARE_PROVIDER_SITE_OTHER): Payer: Self-pay | Admitting: FAMILY MEDICINE

## 2021-12-13 DIAGNOSIS — E782 Mixed hyperlipidemia: Secondary | ICD-10-CM

## 2021-12-13 DIAGNOSIS — E1165 Type 2 diabetes mellitus with hyperglycemia: Secondary | ICD-10-CM

## 2021-12-14 DIAGNOSIS — S46012A Strain of muscle(s) and tendon(s) of the rotator cuff of left shoulder, initial encounter: Secondary | ICD-10-CM

## 2021-12-14 DIAGNOSIS — R600 Localized edema: Secondary | ICD-10-CM

## 2021-12-14 DIAGNOSIS — M62512 Muscle wasting and atrophy, not elsewhere classified, left shoulder: Secondary | ICD-10-CM

## 2021-12-14 DIAGNOSIS — M19012 Primary osteoarthritis, left shoulder: Secondary | ICD-10-CM

## 2021-12-14 DIAGNOSIS — S43082A Other subluxation of left shoulder joint, initial encounter: Secondary | ICD-10-CM

## 2021-12-14 DIAGNOSIS — S43432A Superior glenoid labrum lesion of left shoulder, initial encounter: Secondary | ICD-10-CM

## 2022-01-21 ENCOUNTER — Ambulatory Visit (INDEPENDENT_AMBULATORY_CARE_PROVIDER_SITE_OTHER): Payer: Self-pay | Admitting: FAMILY MEDICINE

## 2022-01-23 ENCOUNTER — Other Ambulatory Visit (INDEPENDENT_AMBULATORY_CARE_PROVIDER_SITE_OTHER): Payer: Self-pay | Admitting: FAMILY MEDICINE

## 2022-01-23 DIAGNOSIS — E038 Other specified hypothyroidism: Secondary | ICD-10-CM

## 2022-01-23 DIAGNOSIS — I10 Essential (primary) hypertension: Secondary | ICD-10-CM

## 2022-01-23 DIAGNOSIS — M792 Neuralgia and neuritis, unspecified: Secondary | ICD-10-CM

## 2022-01-23 DIAGNOSIS — L219 Seborrheic dermatitis, unspecified: Secondary | ICD-10-CM

## 2022-02-03 ENCOUNTER — Telehealth (INDEPENDENT_AMBULATORY_CARE_PROVIDER_SITE_OTHER): Payer: Self-pay | Admitting: FAMILY MEDICINE

## 2022-02-03 NOTE — Telephone Encounter (Signed)
[facility]  Called, and stated that the patient had not had the gabapentin filled since 07/01/21, and the pharmacy was concerned that she hadn't take this for some time and is now taking the 400mg  tid, but I can't see where this was increased? So I am not sure about the  Concern.  , LPN

## 2022-02-03 NOTE — Telephone Encounter (Signed)
Ok I will give the ok because they didn't feel it till they talked to [place].  Marion Downer, LPN

## 2022-02-03 NOTE — Telephone Encounter (Signed)
Patient notified that she can pick up the Rx.  Marion Downer, LPN

## 2022-02-04 ENCOUNTER — Ambulatory Visit (INDEPENDENT_AMBULATORY_CARE_PROVIDER_SITE_OTHER): Payer: Self-pay | Admitting: FAMILY MEDICINE

## 2022-02-09 ENCOUNTER — Telehealth (INDEPENDENT_AMBULATORY_CARE_PROVIDER_SITE_OTHER): Payer: Self-pay | Admitting: FAMILY MEDICINE

## 2022-02-09 NOTE — Telephone Encounter (Signed)
Angela Frey rescheduled her appointment for a medicare wellness to 8/22 but she wanted to know if you could order routine blood work for her? She requested a CMP and CBC .      [name], [place]

## 2022-02-10 ENCOUNTER — Other Ambulatory Visit (INDEPENDENT_AMBULATORY_CARE_PROVIDER_SITE_OTHER): Payer: Self-pay | Admitting: FAMILY MEDICINE

## 2022-02-10 DIAGNOSIS — E782 Mixed hyperlipidemia: Secondary | ICD-10-CM

## 2022-02-10 DIAGNOSIS — E1165 Type 2 diabetes mellitus with hyperglycemia: Secondary | ICD-10-CM

## 2022-02-10 DIAGNOSIS — I10 Essential (primary) hypertension: Secondary | ICD-10-CM

## 2022-02-26 DIAGNOSIS — M19012 Primary osteoarthritis, left shoulder: Secondary | ICD-10-CM | POA: Insufficient documentation

## 2022-03-01 ENCOUNTER — Other Ambulatory Visit (INDEPENDENT_AMBULATORY_CARE_PROVIDER_SITE_OTHER): Payer: Self-pay | Admitting: FAMILY MEDICINE

## 2022-03-01 DIAGNOSIS — I10 Essential (primary) hypertension: Secondary | ICD-10-CM

## 2022-03-01 DIAGNOSIS — E063 Autoimmune thyroiditis: Secondary | ICD-10-CM

## 2022-03-01 NOTE — Telephone Encounter (Signed)
I will refill at her next apt

## 2022-03-02 ENCOUNTER — Ambulatory Visit: Payer: Medicare PPO | Attending: FAMILY MEDICINE | Admitting: FAMILY MEDICINE

## 2022-03-02 ENCOUNTER — Telehealth (INDEPENDENT_AMBULATORY_CARE_PROVIDER_SITE_OTHER): Payer: Self-pay | Admitting: FAMILY MEDICINE

## 2022-03-02 ENCOUNTER — Encounter (INDEPENDENT_AMBULATORY_CARE_PROVIDER_SITE_OTHER): Payer: Self-pay | Admitting: FAMILY MEDICINE

## 2022-03-02 ENCOUNTER — Other Ambulatory Visit: Payer: Self-pay

## 2022-03-02 VITALS — BP 120/66 | HR 79 | Temp 97.9°F | Resp 20 | Ht 64.0 in | Wt 214.6 lb

## 2022-03-02 DIAGNOSIS — Z7984 Long term (current) use of oral hypoglycemic drugs: Secondary | ICD-10-CM | POA: Insufficient documentation

## 2022-03-02 DIAGNOSIS — Z1211 Encounter for screening for malignant neoplasm of colon: Secondary | ICD-10-CM | POA: Insufficient documentation

## 2022-03-02 DIAGNOSIS — Z0001 Encounter for general adult medical examination with abnormal findings: Secondary | ICD-10-CM | POA: Insufficient documentation

## 2022-03-02 DIAGNOSIS — M79671 Pain in right foot: Secondary | ICD-10-CM | POA: Insufficient documentation

## 2022-03-02 DIAGNOSIS — M545 Low back pain, unspecified: Secondary | ICD-10-CM | POA: Insufficient documentation

## 2022-03-02 DIAGNOSIS — M62838 Other muscle spasm: Secondary | ICD-10-CM | POA: Insufficient documentation

## 2022-03-02 DIAGNOSIS — Z87891 Personal history of nicotine dependence: Secondary | ICD-10-CM | POA: Insufficient documentation

## 2022-03-02 DIAGNOSIS — F321 Major depressive disorder, single episode, moderate: Secondary | ICD-10-CM | POA: Insufficient documentation

## 2022-03-02 DIAGNOSIS — E782 Mixed hyperlipidemia: Secondary | ICD-10-CM | POA: Insufficient documentation

## 2022-03-02 DIAGNOSIS — Z1331 Encounter for screening for depression: Secondary | ICD-10-CM | POA: Insufficient documentation

## 2022-03-02 DIAGNOSIS — M792 Neuralgia and neuritis, unspecified: Secondary | ICD-10-CM | POA: Insufficient documentation

## 2022-03-02 DIAGNOSIS — M869 Osteomyelitis, unspecified: Secondary | ICD-10-CM | POA: Insufficient documentation

## 2022-03-02 DIAGNOSIS — M79672 Pain in left foot: Secondary | ICD-10-CM | POA: Insufficient documentation

## 2022-03-02 DIAGNOSIS — E038 Other specified hypothyroidism: Secondary | ICD-10-CM | POA: Insufficient documentation

## 2022-03-02 DIAGNOSIS — Z124 Encounter for screening for malignant neoplasm of cervix: Secondary | ICD-10-CM | POA: Insufficient documentation

## 2022-03-02 DIAGNOSIS — F5101 Primary insomnia: Secondary | ICD-10-CM | POA: Insufficient documentation

## 2022-03-02 DIAGNOSIS — Z131 Encounter for screening for diabetes mellitus: Secondary | ICD-10-CM | POA: Insufficient documentation

## 2022-03-02 DIAGNOSIS — I10 Essential (primary) hypertension: Secondary | ICD-10-CM | POA: Insufficient documentation

## 2022-03-02 DIAGNOSIS — M13871 Other specified arthritis, right ankle and foot: Secondary | ICD-10-CM | POA: Insufficient documentation

## 2022-03-02 DIAGNOSIS — Z7985 Long-term (current) use of injectable non-insulin antidiabetic drugs: Secondary | ICD-10-CM | POA: Insufficient documentation

## 2022-03-02 DIAGNOSIS — E1165 Type 2 diabetes mellitus with hyperglycemia: Secondary | ICD-10-CM | POA: Insufficient documentation

## 2022-03-02 DIAGNOSIS — J410 Simple chronic bronchitis: Secondary | ICD-10-CM | POA: Insufficient documentation

## 2022-03-02 DIAGNOSIS — N3946 Mixed incontinence: Secondary | ICD-10-CM | POA: Insufficient documentation

## 2022-03-02 DIAGNOSIS — G8929 Other chronic pain: Secondary | ICD-10-CM | POA: Insufficient documentation

## 2022-03-02 DIAGNOSIS — E063 Autoimmune thyroiditis: Secondary | ICD-10-CM | POA: Insufficient documentation

## 2022-03-02 DIAGNOSIS — K219 Gastro-esophageal reflux disease without esophagitis: Secondary | ICD-10-CM | POA: Insufficient documentation

## 2022-03-02 MED ORDER — ESOMEPRAZOLE MAGNESIUM 40 MG CAPSULE,DELAYED RELEASE
DELAYED_RELEASE_CAPSULE | ORAL | 1 refills | Status: DC
Start: 2022-03-02 — End: 2022-09-03

## 2022-03-02 MED ORDER — PRAVASTATIN 10 MG TABLET
10.0000 mg | ORAL_TABLET | Freq: Every evening | ORAL | 1 refills | Status: DC
Start: 2022-03-02 — End: 2022-10-28

## 2022-03-02 MED ORDER — OXYBUTYNIN CHLORIDE 5 MG TABLET
5.0000 mg | ORAL_TABLET | Freq: Two times a day (BID) | ORAL | 1 refills | Status: DC
Start: 2022-03-02 — End: 2022-09-03

## 2022-03-02 MED ORDER — LISINOPRIL 20 MG TABLET
20.0000 mg | ORAL_TABLET | Freq: Two times a day (BID) | ORAL | 1 refills | Status: DC
Start: 2022-03-02 — End: 2022-09-03

## 2022-03-02 MED ORDER — HYDROCHLOROTHIAZIDE 25 MG TABLET
25.0000 mg | ORAL_TABLET | Freq: Every day | ORAL | 1 refills | Status: DC
Start: 2022-03-02 — End: 2022-09-03

## 2022-03-02 MED ORDER — MELOXICAM 15 MG TABLET
15.0000 mg | ORAL_TABLET | Freq: Every day | ORAL | 1 refills | Status: DC | PRN
Start: 2022-03-02 — End: 2022-09-03

## 2022-03-02 MED ORDER — METFORMIN 500 MG TABLET
500.0000 mg | ORAL_TABLET | Freq: Two times a day (BID) | ORAL | 1 refills | Status: DC
Start: 2022-03-02 — End: 2022-08-03

## 2022-03-02 MED ORDER — AMITRIPTYLINE 25 MG TABLET
25.0000 mg | ORAL_TABLET | Freq: Every evening | ORAL | 2 refills | Status: DC
Start: 2022-03-02 — End: 2022-09-03

## 2022-03-02 MED ORDER — LEVOTHYROXINE 175 MCG TABLET
175.0000 ug | ORAL_TABLET | Freq: Every morning | ORAL | 1 refills | Status: DC
Start: 2022-03-02 — End: 2022-09-03

## 2022-03-02 MED ORDER — MONTELUKAST 10 MG TABLET
10.0000 mg | ORAL_TABLET | Freq: Every evening | ORAL | 1 refills | Status: DC
Start: 2022-03-02 — End: 2022-09-03

## 2022-03-02 MED ORDER — BACLOFEN 10 MG TABLET
10.0000 mg | ORAL_TABLET | Freq: Four times a day (QID) | ORAL | 1 refills | Status: DC
Start: 2022-03-02 — End: 2022-08-03

## 2022-03-02 MED ORDER — SERTRALINE 100 MG TABLET
100.0000 mg | ORAL_TABLET | Freq: Every day | ORAL | 1 refills | Status: DC
Start: 2022-03-02 — End: 2022-09-03

## 2022-03-02 MED ORDER — ALBUTEROL SULFATE 2.5 MG/3 ML (0.083 %) SOLUTION FOR NEBULIZATION
2.5000 mg | INHALATION_SOLUTION | Freq: Four times a day (QID) | RESPIRATORY_TRACT | 1 refills | Status: DC | PRN
Start: 2022-03-02 — End: 2022-08-04

## 2022-03-02 MED ORDER — GABAPENTIN 400 MG CAPSULE
400.0000 mg | ORAL_CAPSULE | Freq: Three times a day (TID) | ORAL | 1 refills | Status: DC
Start: 2022-03-02 — End: 2022-09-03

## 2022-03-02 MED ORDER — TIOTROPIUM BROMIDE 18 MCG CAPSULE WITH INHALATION DEVICE
18.0000 ug | ORAL_CAPSULE | Freq: Every day | RESPIRATORY_TRACT | 1 refills | Status: DC
Start: 2022-03-02 — End: 2022-09-03
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
## 2022-03-02 NOTE — Addendum Note (Signed)
Addended by: Camelia Eng on: 03/02/2022 03:43 PM     Modules accepted: Orders

## 2022-03-02 NOTE — Patient Instructions (Signed)
Medicare Preventive Services  Medicare coverage information Recommendation for YOU   Heart Disease and Diabetes   Lipid profile Every 5 years or more often if at risk for cardiovascular disease  Last Lipid Panel  (Last result in the past 2 years)      Cholesterol   HDL   LDL   Direct LDL   Triglycerides        10/05/21 2001 158   41   89  Comment: <100 mg/dL, Optimal  100-129 mg/dL, Near/Above Optimal  130-159 mg/dL, Borderline High  160-189 mg/dL, High  >=190 mg/dL, Very high     159            Diabetes Screening  yearly for those at risk for diabetes, 2 tests per year for those with prediabetes Last Glucose: 97    Diabetes Self Management Training or Medical Nutrition Therapy  For those with diabetes, up to 10 hrs initial training within a year, subsequent years up to 2 hrs of follow up training Optional for those with diabetes     Medical Nutrition Therapy Three hours of one-on-one counseling in first year, two hours in subsequent years Optional for those with diabetes, kidney disease   Intensive Behavioral Therapy for Obesity  Face-to-face counseling, first month every week, month 2-6 every other week, month 7-12 every month if continued progress is documented Optional for those with Body Mass Index 30 or higher  Your Body mass index is 36.84 kg/m.   Tobacco Cessation (Quitting) Counseling   Two attempts per year, max 4 sessions per attempt, up to 8 per year, for those with tobacco-related health condition Optional for those that use tobacco   Cancer Screening   Colorectal screening   For anyone age 8 to 11 or any age if high risk:  Screening Colonoscopy every 10 yrs if low risk,  more frequent if higher risk  OR  Cologuard Stool DNA test once every 3 years OR  Fecal Occult Blood Testing yearly OR  Flexible  Sigmoidoscopy  every 5 yr OR  CT Colonography every 5 yrs    See your schedule below   Screening Pap Test Recommended every 3 years for all women age 13 to 50, or every five years if combined with HPV test  (routine screening not needed after total hysterectomy).  Medicare covers every 2 years, up to yearly if high risk.  Screening Pelvic Exam Medicare covers every 2 years, yearly if high risk or childbearing age with abnormal Pap in last 3 yrs. See your schedule below   Screening Mammogram   Recommended every 2 years for women age 66 to 20, or more frequent if you have a higher risk. Selectively recommended for women between 40-49 based on shared decisions about risk. Covered by Medicare up to every year for women age 51 or older See your schedule below   Lung Cancer Screening  Annual low dose computed tomography (LDCT scan) is recommended for those age 53-77 who smoked 20 pack-years and are current smokers or quit smoking within past 15 years (one pack-year= smoking one PPD for one year), after counseling by your doctor or nurse clinician about the possible benefits or harms. See your schedule below   Vaccinations   Pneumococcal Vaccine: Recommended routinely age 40+ with one or two separate vaccines based on your risk    Recommended before age 64 if medical conditions with increased risk  Seasonal Influenza Vaccine: Once every flu season   Hepatitis B Vaccine: 3 doses  if risk (including anyone with diabetes or liver disease)  Shingles Vaccine: Two doses at age 36 or older  Diphtheria Tetanus Pertussis Vaccine: ONCE as adult, booster every 10 years     Immunization History   Administered Date(s) Administered   . Covid-19 Vaccine,Pfizer-BioNTech,Purple Top,29yr+ 09/22/2019, 10/13/2019     Shingles vaccine and Diphtheria Tetanus Pertussis vaccines are available at pharmacies or local health department without a prescription.   Other Screening   Bone Densitometry   Every 24 months for anyone at risk, including postmenopausal       Glaucoma Screening   Yearly if in high risk group such as diabetes, family history, African American age 61+or Hispanic American age 36+      Hepatitis C Screening recommended ONCE for those  born between 1945-1965, or high risk for HCV infection       HIV Testing recommended routinely at least ONCE, covered every year for age 5346to 659regardless of risk, and every year for age over 652who ask for the test or higher risk  Yearly or up to 3 times in pregnancy         Abdominal Aortic Aneurysm Screening Ultrasound   Once between the age of 637-75with a family history of AAA       Your Personalized Schedule for Preventive Tests   Health Maintenance: Pending and Last Completed       Date Due Completion Date    Pneumococcal Vaccine, Age 5-64 (1 - PCV) Never done ---    Pap smear Never done ---    Covid-19 Vaccine (3 - Pfizer series) 12/08/2019 10/13/2019    Colonoscopy Never done ---    Diabetic Retinal Exam 03/14/2021 03/14/2020    Diabetic A1C 04/07/2022 10/05/2021    Adult Tdap-Td (2 - Td or Tdap) 06/13/2022 06/13/2012    Mammography 07/01/2022 07/01/2020    Diabetic Kidney Health eGFR 10/06/2022 10/05/2021    Diabetic Kidney Health Microalb/Cr Ratio 10/18/2022 10/17/2021

## 2022-03-02 NOTE — Telephone Encounter (Signed)
Dr Mertie Moores last diabetic eye exam 03/31/2021.  She will get her mammogram at home in the few months and have copy sent to me.

## 2022-03-02 NOTE — Progress Notes (Addendum)
PRIMARY CARE, MEDICAL OFFICE BUILDING [address]  [place] [id number]  Operated by [facility]  Medicare Annual Wellness Visit    Name: Angela Frey MRN:  [id number]   Date: 03/02/2022 Age: 61 y.o.     SUBJECTIVE:   Angela Frey is a 61 y.o. female for presenting for Medicare Wellness exam.   I have reviewed and reconciled the medication list with the patient today.    Medicare wellness exam   She is going to have reverse shoulder replacement on both shoulders.   She just a stimulator put in for her bladder on her back.   She has had botox injections in the past.   Advised to get diabetic eye exam.   She needs her pap she sees Dr. Deliah Goody. Will refer back to Dr. Deliah Goody.   She had colonoscopy in [place] she will get the name of the doctor from her sister.   [facility]. Dr.Whittington did last exam. Sept 20th 2022.   She has had swelling in her right leg the one that was broken and she uses her pneumatic socking.   She is trying to get her CPAP machine. She is saving the money to get it.   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   She reports she can not afford her medicine and she is supposed to be working on that through some of the medication companies.  She she is doctors here and where [place]        02/28/2020     2:00 PM 10/14/2020     3:00 PM 03/02/2022     2:32 PM   Comprehensive Health Assessment-Adult   Do you wish to complete this form? No Yes No   During the past 4 weeks, how would you rate your health in general? [place]   During the past 4 weeks, how much difficulty have you had doing your usual activities inside and outside your home because of medical or emotional problems? Much difficulty Much difficulty Some difficulty   During the past 4 weeks, was someone available to help you if you needed and wanted help? No No No   In the past year, how many times have you gone to the emergency department or been admitted to a hospital for a  health problem? None None None   Are you generally satisfied with your sleep? No No No   Do you have enough money to buy things you need in everyday life, such as food, clothing, medicines, and housing? Sometimes Sometimes Yes, always   Can you get to places beyond walking distance without help?  (For example, can you drive your own car or travel alone on buses)? Yes Yes Yes   Do you fasten your seatbelt when you are in a car? Yes, usually Yes, usually Yes, usually   Do you exercise 20 minutes 3 or more days per week (such as walking, dancing, biking, mowing grass, swimming)? No, I usually don't exercise this much No, I usually don't exercise this much No, I usually don't exercise this much   How often do you eat food that is healthy (fruits, vegetables, lean meats) instead of unhealthy (sweets, fast food, junk food, fatty foods)? Almost never A little bit of the time Some of the time   Have your parents, brothers or sisters had any of the following problems before the age of 72? (check all that apply)  Diabetes (sugar);High cholesterol   How often do you have trouble taking medicines the eay you are told to take them? I always take them as prescribed  I always take them as prescribed   Do you need any help communicating with your doctors and nurses because of vision or hearing problems? No No No   During the past 12 months, have you experienced confusion or memory loss that is happening more often or is getting worse? Possibly, I am not sure No No   Do you have one person you think of as your personal doctor (primary care provider or family doctor)?   Yes   If you are seeing a Primary Care Provider (PCP) or family doctor. please list their name   Dr. Wyvonnia Lora   Are you now also seeing any specialist physician(s) (such as eye doctor, foot doctor, skin doctor)? Yes Yes Yes   If you are seeing a specialist for anything such as foot, eye, skin, etc.  please list their name(s) Dr whittington-eye, Dr Drusilla Kanner- podiatry  eye, skin urology Dr. Memory Argue   How confident are you that you can control or manage most of your health problems? Very confident Very confident Very confident         I have reviewed and updated as appropriate the past medical, family and social history. 03/02/2022 as summarized below:  Past Medical History:   Diagnosis Date    Asthma     Chronic obstructive airway disease (CMS HCC)     Diabetes (CMS [place])     Hypertension     Hypothyroid      Past Surgical History:   Procedure Laterality Date    Hx pelvic laparoscopy      Hx tonsillectomy      Hx tubal ligation      Leg surgery      Neck surgery      Tuboplasty / tubotubal anastomosis      Ulnar nerve repair Bilateral      Current Outpatient Medications   Medication Sig    albuterol sulfate (PROVENTIL OR VENTOLIN OR PROAIR) 90 mcg/actuation Inhalation HFA Aerosol Inhaler Take 2 Puffs by inhalation Every 6 hours as needed    albuterol sulfate (PROVENTIL) 2.5 mg /3 mL (0.083 %) Inhalation nebulizer solution Take 3 mL (2.5 mg total) by nebulization Four times a day as needed for Wheezing    amitriptyline (ELAVIL) 25 mg Oral Tablet Take 1 Tablet (25 mg total) by mouth Every night    aspirin 81 mg Oral Tablet, Chewable Chew 1 Tablet (81 mg total) Once a day    baclofen (LIORESAL) 10 mg Oral Tablet Take 1 Tablet (10 mg total) by mouth Four times a day    Blood Sugar Diagnostic (ACCU-CHEK GUIDE TEST STRIPS) Strip 1 Strip by Does not apply route Four times a day    Blood-Glucose Meter Misc 4 times per day    calcium carbonate/vitamin D3 (CALCIUM 600 + D ORAL) Take by mouth Once a day    chlorhexidine gluconate (PERIDEX) 0.12 % Mucous Membrane Mouthwash Swish and spit 15 mL Twice daily    cyanocobalamin (VITAMIN B 12) 1,000 mcg Oral Tablet Take 1 Tablet (1,000 mcg total) by mouth Once a day    esomeprazole magnesium (NEXIUM) 40 mg Oral Capsule, Delayed Release(E.C.) TAKE 1 CAPSULE BY MOUTH IN THE MORNING BEFORE BREAKFAST    fluorouraciL (EFUDEX) 5 % Cream Apply topically Twice  daily Apply to lesions as directed    gabapentin (NEURONTIN) 400  mg Oral Capsule Take 1 Capsule (400 mg total) by mouth Three times a day    hydroCHLOROthiazide (HYDRODIURIL) 25 mg Oral Tablet Take 1 Tablet (25 mg total) by mouth Once a day    ketoconazole (NIZORAL) 2 % Shampoo USE 1/2 TO 1 (ONE-HALF TO ONE) OUNCE OF SHAMPOO TOPICALLY THREE TIMES A WEEK    Lactobac no.41/Bifidobact no.7 (PROBIOTIC-10 ORAL) Take by mouth Once a day    Lancets (ACCU-CHEK SOFTCLIX LANCETS) Misc 1 Each by Does not apply route Four times a day    levothyroxine (SYNTHROID) 175 mcg Oral Tablet Take 1 Tablet (175 mcg total) by mouth Every morning    lisinopriL (PRINIVIL) 20 mg Oral Tablet Take 1 Tablet (20 mg total) by mouth Twice daily    Magnesium 250 mg Oral Tablet Take by mouth Once a day    meloxicam (MOBIC) 15 mg Oral Tablet Take 1 Tablet (15 mg total) by mouth Once per day as needed for Pain    metFORMIN (GLUCOPHAGE) 500 mg Oral Tablet Take 1 Tablet (500 mg total) by mouth Twice daily with food    Milk Thistle 500 mg Oral Capsule Take by mouth Twice daily    montelukast (SINGULAIR) 10 mg Oral Tablet Take 1 Tablet (10 mg total) by mouth Every evening    nystatin (MYCOSTATIN) 100,000 unit/gram Cream Apply 0.5 g t.i.d. to affected area.    nystatin (NYSTOP) 100,000 unit/gram Powder by Apply Topically route Three times a day as needed    oxybutynin (DITROPAN) 5 mg Oral Tablet Take 1 Tablet (5 mg total) by mouth Twice daily    pediatric multivitamins Oral Tablet, Chewable Chew 1 Tablet Once a day    pravastatin (PRAVACHOL) 10 mg Oral Tablet Take 1 Tablet (10 mg total) by mouth Every evening    semaglutide (RYBELSUS) 3 mg Oral Tablet Take 1 Tablet (3 mg total) by mouth Once a day    sertraline (ZOLOFT) 100 mg Oral Tablet Take 1 Tablet (100 mg total) by mouth Once a day    terbinafine HCL (LAMISIL AT) 1 % Cream Apply topically Twice daily Apply to nails twice daily with a q tip. Apply to bottoms of feet and toes twice daily for 2-4 weeks or  until resolution    tiotropium bromide (SPIRIVA HANDIHALER) 18 mcg Inhalation Capsule, w/Inhalation Device Take 1 Capsule (18 mcg total) by inhalation Once a day    Urea 40 % 40 % Cream by Apply Topically route Twice daily Apply a pea sized amount with a q-tip to nails twice daily     Family Medical History:    None         Social History     Socioeconomic History    Marital status: Unknown   Tobacco Use    Smoking status: Former     Types: Cigarettes    Smokeless tobacco: Never   Vaping Use    Vaping Use: Never used   Substance and Sexual Activity    Alcohol use: Never    Drug use: Never     Social Determinants of Company secretary Strain: Low Risk     SDOH Financial: No   Transportation Needs: Low Risk     SDOH Transportation: No   Social Connections: Medium Risk    SDOH Social Isolation: 3 to 5 times a week   Intimate Partner Violence: Low Risk     SDOH Domestic Violence: No   Housing Stability: Low Risk  SDOH Housing Situation: I have housing.    SDOH Housing Worry: No   Health Literacy: Low Risk     SDOH Health Literacy: Never   Employment Status: Low Risk     SDOH Employment: Otherwise unemployed but not seeking work (ex. Ship broker, retired, disabled, unpaid primary care giver)         List of [place] Team       PCP       Name Type Specialty Phone Number    Kerry Dory, DO Physician [place] [phone]              Care Team       No care team found                      Health Maintenance   Topic Date Due    Pneumococcal Vaccine, Age 57-64 (1 - PCV) Never done    Pap smear  Never done    Covid-19 Vaccine (3 - Pfizer series) 12/08/2019    Colonoscopy  Never done    Diabetic Retinal Exam  03/14/2021    Diabetic A1C  04/07/2022    Adult Tdap-Td (2 - Td or Tdap) 06/13/2022    Mammography  07/01/2022    Diabetic Kidney Health eGFR  10/06/2022    Diabetic Kidney Health Microalb/Cr Ratio  10/18/2022    Annual Wellness Visit - Calendar Year Insurers  Completed     Meningococcal Vaccine  Aged Out    Depression Screening  Discontinued    Hepatitis C screening  Discontinued    HIV Screening  Discontinued    Influenza Vaccine  Discontinued    Shingles Vaccine  Discontinued     Medicare Wellness Assessment   Medicare initial or wellness physical in the last year?: No  Advance Directives   Does patient have a living will or MPOA: no   Has patient provided [employer] with a copy?: no   Advance directive information given to the patient today?: no      Activities of Daily Living   Do you need help with dressing, bathing, or walking?: No   Do you need help with shopping, housekeeping, medications, or finances?: No   Do you have rugs in hallways, broken steps, or poor lighting?: No   Do you have grab bars in your bathroom, non-slip strips in your tub, and hand rails on your stairs?: No   Urinary Incontinence Screen       Cognitive Function Screen (1=Yes, 0=No)   What is you age?: Correct   What is the time to the nearest hour?: Correct   What is the year?: Correct   What is the name of this clinic?: Correct   Can the patient recognize two persons (the doctor, the nurse, home help, etc.)?: Correct   What is the date of your birth? (day and month sufficient) : Correct   In what year did World War II end?: Incorrect   Who is the current president of the [place]?: Correct   Count from 20 down to 1?: Correct   What address did I give you earlier?: Correct   Total Score: 9   Interpretation of Total Score: Greater than 6 Normal   Hearing Screen   Have you noticed any hearing difficulties?: No  After whispering 9-1-6 how many numbers did the patient repeat correctly?: 3   Fall Risk Screen   Do you feel unsteady when  standing or walking?: Yes  Do you worry about falling?: No  Have you fallen in the past year?: Yes  How many times have you fallen?: Once  Were you ever injured from falling?: Yes  Timed up and go test (in seconds): 7   Vision Screen   Right Eye = 20: 1.33   Left Eye =  20: 1.54   Depression Screen     Little interest or pleasure in doing things.: Not at all  Feeling down, depressed, or hopeless: Several Days  PHQ 2 Total: 1     Pain Score   Pain Score:   7    Substance Use-Abuse Screening     Tobacco Use     In Past 12 MONTHS, how often have you used any tobacco product (for example, cigarettes, e-cigarettes, cigars, pipes, or smokeless tobacco)?: Never     Alcohol use     In the PAST 12 MONTHS, how often have you had 5 (men)/4 (women) or more drinks containing alcohol in one day?: Never     Prescription Drug Use     In the PAST 12 months, how often have you used any prescription medications just for the feeling, more than prescribed, or that were not prescribed for you? Prescriptions may include: opioids, benzodiazepines, medications for ADHD: Never           Illicit Drug Use   In the PAST 12 MONTHS, how often have you used any drugs, including marijuana, cocaine or crack, heroin, methamphetamine, hallucinogens, ecstasy/MDMA?: Never          OBJECTIVE:   BP 120/66   Pulse 79   Temp 36.6 C (97.9 F) (Temporal)   Resp 20   Ht 1.626 m (5' 4" )   Wt 97.3 kg (214 lb 9.6 oz)   LMP  (LMP Unknown) Comment: stopped having periods at aget 44-46  SpO2 94%   BMI 36.84 kg/m        Other appropriate exam:  Physical Exam  Constitutional:       Appearance: She is obese.   HENT:      Head: Normocephalic and atraumatic.      Right Ear: Tympanic membrane normal.      Left Ear: Tympanic membrane normal.      Nose: Nose normal.      Mouth/Throat:      Mouth: Mucous membranes are moist.   Eyes:      Conjunctiva/sclera: Conjunctivae normal.   Cardiovascular:      Rate and Rhythm: Normal rate and regular rhythm.      Pulses: Normal pulses.      Heart sounds: Normal heart sounds.   Pulmonary:      Effort: Pulmonary effort is normal.      Breath sounds: Normal breath sounds.   Abdominal:      General: Bowel sounds are normal.      Palpations: Abdomen is soft.   Musculoskeletal:      Right lower  leg: Edema present.   Skin:     General: Skin is warm and dry.      Comments: Scar on right ankle   Neurological:      General: No focal deficit present.      Mental Status: She is alert. Mental status is at baseline.   Psychiatric:         Mood and Affect: Mood normal.       Health Maintenance Due   Topic Date Due    Pneumococcal Vaccine,  Age 63-64 (1 - PCV) Never done    Pap smear  Never done    Covid-19 Vaccine (3 - Pfizer series) 12/08/2019    Colonoscopy  Never done    Diabetic Retinal Exam  03/14/2021      ASSESSMENT & PLAN:    Identified Risk Factors/ Recommended Actions   (I10) Essential hypertension  (primary encounter diagnosis)  Plan: refill meds monitor BP at home and office     (M86.9) Osteomyelitis, unspecified site, unspecified type (CMS [facility])  Plan: resolved       Fall Risk Follow up plan of care: Discussed optimizing home safety        ICD-10-CM    1. Essential hypertension  I10 hydroCHLOROthiazide (HYDRODIURIL) 25 mg Oral Tablet     lisinopriL (PRINIVIL) 20 mg Oral Tablet      2. Osteomyelitis, unspecified site, unspecified type (CMS HCC)  M86.9       3. Colon cancer screening  Z12.11       4. Cervical cancer screening  Z12.4       5. Diabetes mellitus screening  Z13.1       6. Muscle spasm  M62.838 baclofen (LIORESAL) 10 mg Oral Tablet      7. Neuropathic pain  M79.2 amitriptyline (ELAVIL) 25 mg Oral Tablet     gabapentin (NEURONTIN) 400 mg Oral Capsule      8. Hypothyroidism due to Hashimoto's thyroiditis  E03.8 levothyroxine (SYNTHROID) 175 mcg Oral Tablet    E06.3       9. Lumbar pain  M54.50 meloxicam (MOBIC) 15 mg Oral Tablet      10. Uncontrolled type 2 diabetes mellitus with hyperglycemia (CMS HCC)  E11.65 metFORMIN (GLUCOPHAGE) 500 mg Oral Tablet     pravastatin (PRAVACHOL) 10 mg Oral Tablet      11. Mixed hyperlipidemia  E78.2 pravastatin (PRAVACHOL) 10 mg Oral Tablet      12. Moderate major depression (CMS HCC)  F32.1 sertraline (ZOLOFT) 100 mg Oral Tablet      13. Simple chronic  bronchitis (CMS HCC)  J41.0 albuterol sulfate (PROVENTIL) 2.5 mg /3 mL (0.083 %) Inhalation nebulizer solution     montelukast (SINGULAIR) 10 mg Oral Tablet     tiotropium bromide (SPIRIVA HANDIHALER) 18 mcg Inhalation Capsule, w/Inhalation Device      14. Primary insomnia  F51.01 amitriptyline (ELAVIL) 25 mg Oral Tablet      15. Gastroesophageal reflux disease without esophagitis  K21.9 esomeprazole magnesium (NEXIUM) 40 mg Oral Capsule, Delayed Release(E.C.)      16. Mixed stress and urge urinary incontinence  N39.46 oxybutynin (DITROPAN) 5 mg Oral Tablet      17. Allergic arthritis of right ankle  M13.871 Refer to External Provider      18. Chronic pain of both feet  M79.671 Refer to External Provider    [phone]     [phone]               Orders Placed This Encounter    Refer to External Provider    albuterol sulfate (PROVENTIL) 2.5 mg /3 mL (0.083 %) Inhalation nebulizer solution    amitriptyline (ELAVIL) 25 mg Oral Tablet    baclofen (LIORESAL) 10 mg Oral Tablet    esomeprazole magnesium (NEXIUM) 40 mg Oral Capsule, Delayed Release(E.C.)    gabapentin (NEURONTIN) 400 mg Oral Capsule    hydroCHLOROthiazide (HYDRODIURIL) 25 mg Oral Tablet    levothyroxine (SYNTHROID) 175 mcg Oral Tablet    lisinopriL (PRINIVIL) 20 mg Oral Tablet  meloxicam (MOBIC) 15 mg Oral Tablet    metFORMIN (GLUCOPHAGE) 500 mg Oral Tablet    montelukast (SINGULAIR) 10 mg Oral Tablet    oxybutynin (DITROPAN) 5 mg Oral Tablet    pravastatin (PRAVACHOL) 10 mg Oral Tablet    sertraline (ZOLOFT) 100 mg Oral Tablet    tiotropium bromide (SPIRIVA HANDIHALER) 18 mcg Inhalation Capsule, w/Inhalation Device          The patient has been educated about risk factors and recommended preventive care. Written Prevention Plan completed/ updated and given to patient (see After Visit Summary).    Medicare wellness exam today  Advised her to do her fasting labs  Refer to podiatry for evaluation of ankle pain history of fracture  Refill Spiriva uses directed  COPD follow-up with pulmonology as scheduled  Refill Zoloft take as directed anxiety depression and mood continue to monitor for symptom control  Refill pravastatin take as directed hyperlipidemia have your lab work completed please  Refill oxybutynin take as directed bladder issues follow-up with urology as scheduled follow-up for bladder stimulator incision looks well today.  Refill Singulair take as directed seasonal allergies continue to monitor for symptom control  Refill metformin take as directed diabetes type 2 continue to monitor labs have labs completed please  Refill meloxicam take as directed for arthritis pain he to monitor for symptom control  Refill lisinopril take as directed hypertension monitor blood pressure at home in office  Refill levothyroxine take as directed hypothyroidism continue to monitor labs every labs completed please  Refill hydrochlorothiazide take as directed hypertension continue to monitor blood pressure at home and in office  Refill Nexium take as directed continue to monitor for symptom control  Refill meloxicam continue to monitor for symptom control  Refill baclofen for muscle spasms P to monitor for symptom control  Refill amitriptyline take as directed for mood and insomnia continue to monitor for symptom control  Refill albuterol uses directed COPD continue   to monitor for symptom control  Follow-up with sleep doctor for reported CPAP and obstructive sleep apnea  Request diabetic eye exam from Dr. Tora Kindred  Refer to Dr. Deliah Goody for Pap smear  When you have your mammogram completed have copy sent to my office  Continue all medicines as directed    Depression screening is negative recheck next apt     BMI addressed: Advised on diet, weight loss, and exercise to reduce above normal BMI.    Fall Risk Follow up plan of care: Discussed optimizing home safety    Please get over the counter Calcium citrate with vitamin D. Take 2 daily.  You should be getting 1200 mg. Calcium  and 800 mg. Vitamin D daily.    A total of (47) minutes was spent on this patient encounter including review of historical information, examination, documentation and post visit activities    The patient/care give was given ample opportunity to ask questions and those questions were answered to his/her satisfaction. A good faith effort was made to reconcile the patient's medications. The patient/caregiver was counseled on any appropriate vaccinations by the provider and questions were answered. The patient/care giver was told to contact me with any additional questions or concerns, or go to the ED in an emergency.     Return in about 6 months (around 09/01/2022) for follow up chronic medical conditions .     K , DO

## 2022-03-03 ENCOUNTER — Telehealth (INDEPENDENT_AMBULATORY_CARE_PROVIDER_SITE_OTHER): Payer: Self-pay | Admitting: FAMILY MEDICINE

## 2022-03-03 NOTE — Telephone Encounter (Signed)
Diabetic eye exam requested over the phone from Dr. Mertie Moores.  Marion Downer, LPN

## 2022-03-04 ENCOUNTER — Other Ambulatory Visit (INDEPENDENT_AMBULATORY_CARE_PROVIDER_SITE_OTHER): Payer: Self-pay

## 2022-04-12 ENCOUNTER — Telehealth (INDEPENDENT_AMBULATORY_CARE_PROVIDER_SITE_OTHER): Payer: Self-pay | Admitting: FAMILY MEDICINE

## 2022-04-12 ENCOUNTER — Other Ambulatory Visit (INDEPENDENT_AMBULATORY_CARE_PROVIDER_SITE_OTHER): Payer: Self-pay | Admitting: FAMILY MEDICINE

## 2022-04-12 NOTE — Telephone Encounter (Signed)
Patient called in and wanted to know if you could order her a Dexa scan?    Andres Shad, Unit [place]

## 2022-04-15 ENCOUNTER — Telehealth (INDEPENDENT_AMBULATORY_CARE_PROVIDER_SITE_OTHER): Payer: Self-pay | Admitting: FAMILY MEDICINE

## 2022-04-15 NOTE — Telephone Encounter (Signed)
Pt also stated her insurance will pay for the dexa scan .      Andres Shad, Unit [place]

## 2022-04-15 NOTE — Telephone Encounter (Signed)
Angela Frey called in and stated she found a lump in her left breast and wanted to know if you could order a mammogram.      Andres Shad, Unit [place]

## 2022-04-16 ENCOUNTER — Other Ambulatory Visit (INDEPENDENT_AMBULATORY_CARE_PROVIDER_SITE_OTHER): Payer: Self-pay | Admitting: FAMILY MEDICINE

## 2022-04-16 DIAGNOSIS — M810 Age-related osteoporosis without current pathological fracture: Secondary | ICD-10-CM

## 2022-04-16 NOTE — Telephone Encounter (Signed)
Attempted to call and advise pt, but no answer, lvm.    Comments added by Debarah Crape on 04/16/22 at 08:01.

## 2022-04-20 NOTE — Telephone Encounter (Signed)
I attempted to call her and let her know, but her phone went to voicemail, I read her the message below and asked her to call the office if she wanted to come in for a visit.    Andres Shad, Unit [place]

## 2022-04-20 NOTE — Telephone Encounter (Signed)
Binnie called in again and stated that she found a lump in her breast and wanted an order for mammogram.       Andres Shad, Unit [facility]

## 2022-04-21 ENCOUNTER — Telehealth (INDEPENDENT_AMBULATORY_CARE_PROVIDER_SITE_OTHER): Payer: Self-pay | Admitting: FAMILY MEDICINE

## 2022-04-21 NOTE — Telephone Encounter (Signed)
She stated she has not had a mammogram since 2021, I told her I would send you another message and let you know.   For Documentation :     She also stated she wanted to just walk in to have the mammogram and Dexascan completed, I tried explaining that those things have to be scheduled and she stated that no, she was told by radiology she could walk in,  I transferred her to the radiology dept so they could explain as well.    Andres Shad, Unit [place]

## 2022-04-21 NOTE — Telephone Encounter (Signed)
I spoke to [facility] in Radiology and she stated that she could have screening mammogram at any time because according to their system they use to track mammograms her last one was 06/2020, but she is booked out till the end of November for screening mammograms. She stated if you examine Jezel and order a diagnostic one then she could possibly get her in sooner.    Andres Shad, Unit [place]

## 2022-04-23 ENCOUNTER — Encounter (INDEPENDENT_AMBULATORY_CARE_PROVIDER_SITE_OTHER): Payer: Self-pay | Admitting: FAMILY MEDICINE

## 2022-04-23 ENCOUNTER — Ambulatory Visit: Payer: Medicare PPO | Attending: FAMILY MEDICINE | Admitting: FAMILY MEDICINE

## 2022-04-23 ENCOUNTER — Other Ambulatory Visit: Payer: Self-pay

## 2022-04-23 DIAGNOSIS — N6452 Nipple discharge: Secondary | ICD-10-CM | POA: Insufficient documentation

## 2022-04-23 DIAGNOSIS — Z6836 Body mass index (BMI) 36.0-36.9, adult: Secondary | ICD-10-CM | POA: Insufficient documentation

## 2022-04-23 DIAGNOSIS — Z1331 Encounter for screening for depression: Secondary | ICD-10-CM

## 2022-04-23 DIAGNOSIS — N63 Unspecified lump in unspecified breast: Secondary | ICD-10-CM

## 2022-04-23 DIAGNOSIS — N6320 Unspecified lump in the left breast, unspecified quadrant: Secondary | ICD-10-CM | POA: Insufficient documentation
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
## 2022-04-23 NOTE — Progress Notes (Signed)
PRIMARY CARE, MEDICAL OFFICE BUILDING [address]  [address]  [place]  [id number]      Operated by [facility]     Name: Angela Frey MRN:  [id number]   Date: 04/23/2022 Age: 61 y.o.      Chief Complaint   Patient presents with    Breast Lump     Left breast       SUBJECTIVE    Patient is 61 y.o. she  presents today for complaint of a lump in her left breast.she noticed it a few days ago and than then she noticed the lump and a few months ago her nipple hurt and she squeezed the nipple and it looked like some pus come out. She say it had some blood in it.  She reports she has nipple marker in the breast also.  She says she did not hurt her breast but the bruise was there it was there for a while.  She says she went to the kidney specialists and they did a procedure in test but it did not work so they can not do anything about it.      ROS:      ROS - pertinent for presenting problem.  Systems otherwise negative than what has been noted.    PAST MEDICAL HISTORY  I have reviewed and updated as appropriate the past medical, surgical, family, and social history today:    Medical History/Surgical History/Family History/Social History  Past Medical History:   Diagnosis Date    Asthma     Chronic obstructive airway disease (CMS HCC)     Diabetes (CMS [place])     Hypertension     Hypothyroid          Past Surgical History:   Procedure Laterality Date    HX PELVIC LAPAROSCOPY      HX TONSILLECTOMY      HX TUBAL LIGATION      HYSTEROSCOPY WITH DILATION AND CURETTAGE N/A 04/09/2021    Performed by Kathi Der, DO at [facility]      2-3 surgeries    NECK SURGERY      TUBOPLASTY / TUBOTUBAL ANASTOMOSIS      ULNAR NERVE REPAIR Bilateral      Family Medical History:    None          Social History     Socioeconomic History    Marital status: Unknown   Tobacco Use    Smoking status: Former     Types: Cigarettes    Smokeless tobacco: Never   Vaping Use    Vaping Use: Never used    Substance and Sexual Activity    Alcohol use: Never    Drug use: Never     Social Determinants of Health     Financial Resource Strain: Low Risk  (03/02/2022)    Financial Resource Strain     SDOH Financial: No   Transportation Needs: Low Risk  (03/02/2022)    Transportation Needs     SDOH Transportation: No   Social Connections: Medium Risk (03/02/2022)    Social Connections     SDOH Social Isolation: 3 to 5 times a week   Intimate Partner Violence: Low Risk  (04/23/2022)    Intimate Partner Violence     SDOH Domestic Violence: No   Housing Stability: Low Risk  (03/02/2022)    Housing Stability     SDOH Housing Situation: I have housing.  SDOH Housing Worry: No       Allergies:  Allergies   Allergen Reactions    Pseudoephedrine Hcl Hives/ Urticaria    Clindamycin Phosphate     Guaifenesin  Other Adverse Reaction (Add comment)    Cleocin [Clindamycin] Hives/ Urticaria    Doxylamin-Pse-Dm-Acetaminophen Hives/ Urticaria and  Other Adverse Reaction (Add comment)    Tessalon [Benzonatate] Hives/ Urticaria       Medications:  Current Outpatient Medications   Medication Sig    albuterol sulfate (PROVENTIL OR VENTOLIN OR PROAIR) 90 mcg/actuation Inhalation HFA Aerosol Inhaler Take 2 Puffs by inhalation Every 6 hours as needed    albuterol sulfate (PROVENTIL) 2.5 mg /3 mL (0.083 %) Inhalation nebulizer solution Take 3 mL (2.5 mg total) by nebulization Four times a day as needed for Wheezing    amitriptyline (ELAVIL) 25 mg Oral Tablet Take 1 Tablet (25 mg total) by mouth Every night    aspirin 81 mg Oral Tablet, Chewable Chew 1 Tablet (81 mg total) Once a day    baclofen (LIORESAL) 10 mg Oral Tablet Take 1 Tablet (10 mg total) by mouth Four times a day    Blood Sugar Diagnostic (ACCU-CHEK GUIDE TEST STRIPS) Strip 1 Strip by Does not apply route Four times a day    Blood-Glucose Meter Misc 4 times per day    calcium carbonate/vitamin D3 (CALCIUM 600 + D ORAL) Take by mouth Once a day    chlorhexidine gluconate (PERIDEX) 0.12  % Mucous Membrane Mouthwash Swish and spit 15 mL Twice daily    cholecalciferol, Vitamin D3, 125 mcg (5,000 unit) Oral Tablet Take by oral route.    cyanocobalamin (VITAMIN B 12) 1,000 mcg Oral Tablet Take 1 Tablet (1,000 mcg total) by mouth Once a day    esomeprazole magnesium (NEXIUM) 40 mg Oral Capsule, Delayed Release(E.C.) TAKE 1 CAPSULE BY MOUTH IN THE MORNING BEFORE BREAKFAST    fluorouraciL (EFUDEX) 5 % Cream Apply topically Twice daily Apply to lesions as directed    gabapentin (NEURONTIN) 400 mg Oral Capsule Take 1 Capsule (400 mg total) by mouth Three times a day    hydroCHLOROthiazide (HYDRODIURIL) 25 mg Oral Tablet Take 1 Tablet (25 mg total) by mouth Once a day    ketoconazole (NIZORAL) 2 % Shampoo USE 1/2 TO 1 (ONE-HALF TO ONE) OUNCE OF SHAMPOO TOPICALLY THREE TIMES A WEEK    Lactobac no.41/Bifidobact no.7 (PROBIOTIC-10 ORAL) Take by mouth Once a day    Lancets (ACCU-CHEK SOFTCLIX LANCETS) Misc 1 Each by Does not apply route Four times a day    levothyroxine (SYNTHROID) 175 mcg Oral Tablet Take 1 Tablet (175 mcg total) by mouth Every morning    lisinopriL (PRINIVIL) 20 mg Oral Tablet Take 1 Tablet (20 mg total) by mouth Twice daily    Magnesium 250 mg Oral Tablet Take by mouth Once a day    meloxicam (MOBIC) 15 mg Oral Tablet Take 1 Tablet (15 mg total) by mouth Once per day as needed for Pain    metFORMIN (GLUCOPHAGE) 500 mg Oral Tablet Take 1 Tablet (500 mg total) by mouth Twice daily with food    Milk Thistle 500 mg Oral Capsule Take by mouth Twice daily    montelukast (SINGULAIR) 10 mg Oral Tablet Take 1 Tablet (10 mg total) by mouth Every evening    nystatin (MYCOSTATIN) 100,000 unit/gram Cream Apply 0.5 g t.i.d. to affected area.    nystatin (NYSTOP) 100,000 unit/gram Powder by Apply Topically route Three times a day  as needed    oxybutynin (DITROPAN) 5 mg Oral Tablet Take 1 Tablet (5 mg total) by mouth Twice daily    pediatric multivitamins Oral Tablet, Chewable Chew 1 Tablet Once a day     pravastatin (PRAVACHOL) 10 mg Oral Tablet Take 1 Tablet (10 mg total) by mouth Every evening    semaglutide (RYBELSUS) 3 mg Oral Tablet Take 1 Tablet (3 mg total) by mouth Once a day    sertraline (ZOLOFT) 100 mg Oral Tablet Take 1 Tablet (100 mg total) by mouth Once a day    solifenacin (VESICARE) 10 mg Oral Tablet Take 1 Tablet (10 mg total) by mouth Once a day    terbinafine HCL (LAMISIL AT) 1 % Cream Apply topically Twice daily Apply to nails twice daily with a q tip. Apply to bottoms of feet and toes twice daily for 2-4 weeks or until resolution    tiotropium bromide (SPIRIVA HANDIHALER) 18 mcg Inhalation Capsule, w/Inhalation Device Take 1 Capsule (18 mcg total) by inhalation Once a day    Urea 40 % 40 % Cream by Apply Topically route Twice daily Apply a pea sized amount with a q-tip to nails twice daily       Immunizations:  Immunization History   Administered Date(s) Administered    Covid-19 Vaccine,Pfizer-BioNTech,Purple Top,4yr+ 09/22/2019, 10/13/2019       OBJECTIVE    BP 124/71   Pulse 75   Temp 36.2 C (97.1 F)   Ht 1.626 m (_0 )   Wt 96.2 kg (212 lb)   LMP  (LMP Unknown) Comment: stopped having periods at aget 44-46  SpO2 96%   BMI 36.39 kg/m         Nursing Notes:   PCollene Leyden CMA  04/23/22 1409  Signed  Body mass index is 36.39 kg/m.    Fall Risk Assessment  Do you feel unsteady when standing or walking?: No  Do you worry about falling?: No  Have you fallen in the past year?: No      PHQ Questionnaire  Little interest or pleasure in doing things.: Several Days  Feeling down, depressed, or hopeless: Several Days  PHQ 2 Total: 2               No data to display                  Functional Health Screening:   Patient is under 18: No  Have you had a recent unexplained weight loss or gain?: No  Because we are aware of abuse and domestic violence today, we ask all patients: Are you being hurt, hit, or frightened by anyone at your home or in your life?: No  Do you have any basic needs  within your home that are not being met? (such as Food, Shelter, CGames developer Transportation): No  Patient is under 18 and therefore has no Advance Directives: No  Patient has: No Advance  Patient has Advance Directive: No  Patient offered: Refused Packet  Screening unable to be completed: No          Travel Screening       Question Response    Have you been in contact with someone who was sick? --    Do you have any of the following new or worsening symptoms? None of these    Have you traveled internationally in the last month? No          Travel History   Travel since 03/24/22  No documented travel since 03/24/22         [name], [place]  04/23/2022, 14:09       Physical Exam:  General: Pleasant, WNWD, NAD, AAO  Head: Normocephalic, AT,no lesions. Bruise on right check.    Eyes: PERL, EOM's full, conjunctivae clear, fundi grossly normal.   Nose:  no rhinnorhea noted, no lesions  Throat: Clear, no exudates, no lesions MMM and pink.  Neck: Supple, no masses, no thyromegaly, no bruits.   Chest: Lungs clear, no rales, no rhonchi, no wheezes.   Heart: RRR, no murmurs, no rubs, no gallops.   Abdomen: Soft, no tenderness, no masses, BS normal.    Extremities: Normal gait, no deformities, no edema.   Breast: fiberous tissue bilateral breasts and redness and flaking of the left nipple and nodule at lateral breast 3 o'clock.    Neuro: Physiological, no localizing findings.   Skin:  Normal PWD, nipple redness and dry flaky skin  Psych: Mood and Affect normal      ASSESSMENT:        ICD-10-CM    1. Morbid obesity (CMS [place])  E66.01       2. Breast lump  N63.0 MAMMO UNILAT DIAGNOSTIC LEFT-ADDL VIEWS/BREAST US AS REQ BY RAD      3. Discharge from left nipple  N64.52 MAMMO UNILAT DIAGNOSTIC LEFT-ADDL VIEWS/BREAST US AS REQ BY RAD      4. Depression screening negative  Z13.31           Body mass index is 36.39 kg/m. BMI addressed: Advised on diet, weight loss, and exercise to reduce above normal BMI.        Laboratory  studies/data reviewed  I have reviewed all available and pertinent laboratory studies, images and health maintenance.    PLAN:    Records reviewed today:  Refill needed meds. ATTACHED  Pt was educated/ counseled on the decisions made today with their involvement in these plans.  Immunizations:  Encourage COVID booster  Preventive counseling:  Continue to monitor blood sugars and blood pressures  Diet and exercise reviewed  See dentist and eye doctor with regular scheduled visits.  Active listening/Asked pertinent questions.    A total of (19) minutes was spent on this patient encounter including review of historical information, examination, documentation and post visit activities.  Continue to monitor blood sugar blood pressure at home take all the medications as directed  Follow-up with all specialists  Order diagnostic mammogram left breast will call with results.   Encourage colonoscopy   Encourage immunizations   She will get her labs done she ate today  She is scheduled for DEXA scan also.  If she has pain or discomfort she can take Tylenol or ibuprofen over-the-counter.  Continue all prescribed medications as directed    Depression screening is negative recheck next apt     BMI addressed: Advised on diet, weight loss, and exercise to reduce above normal BMI.    OBESITY - start an exercise program and gradually work up to 30 mins to one hour 5 days a week. IF your can't do that much 10 minutes 3 times a day. Keep a calorie counting log and weigh yourself once a week. Join a waling group it can make exercise more fun. Join a support group. Bring your logs to all visits. Eat a high fiber low fat lower calorie diet.     FOLLOW UP:  Return for As scheduled.    Patient can return sooner if needed.  The patient/care give was given ample opportunity to ask questions and those questions were answered to his/her satisfaction. A good faith effort was made to reconcile the patient's medications. The patient/caregiver  was counseled on any appropriate vaccinations by the provider and questions were answered. The patient/care giver was told to contact me with any additional questions or concerns, or go to the ED in an emergency.       Kerry Dory, DO. 04/23/22 14:28  [facility]  [address]  [place], [place] [address]  Phone [phone]    This note may have been partially generated using M-Modal Fluency Direct system, and there may be some incorrect words, spellings, and punctuation that were not noted in checking the note before saving.

## 2022-05-06 ENCOUNTER — Other Ambulatory Visit: Payer: Self-pay

## 2022-05-06 ENCOUNTER — Other Ambulatory Visit (HOSPITAL_COMMUNITY): Payer: Medicare PPO

## 2022-05-06 ENCOUNTER — Other Ambulatory Visit (INDEPENDENT_AMBULATORY_CARE_PROVIDER_SITE_OTHER): Payer: Self-pay | Admitting: FAMILY MEDICINE

## 2022-05-06 ENCOUNTER — Inpatient Hospital Stay
Admission: RE | Admit: 2022-05-06 | Discharge: 2022-05-06 | Disposition: A | Discharge status: Home / Self Care | Payer: Medicare PPO | Source: Ambulatory Visit | Attending: FAMILY MEDICINE | Admitting: FAMILY MEDICINE

## 2022-05-06 ENCOUNTER — Ambulatory Visit (HOSPITAL_COMMUNITY): Payer: Self-pay

## 2022-05-06 ENCOUNTER — Inpatient Hospital Stay (HOSPITAL_COMMUNITY)
Admission: RE | Admit: 2022-05-06 | Discharge: 2022-05-06 | Disposition: A | Discharge status: Home / Self Care | Payer: Medicare PPO | Source: Ambulatory Visit

## 2022-05-06 DIAGNOSIS — E063 Autoimmune thyroiditis: Secondary | ICD-10-CM

## 2022-05-06 DIAGNOSIS — Z131 Encounter for screening for diabetes mellitus: Secondary | ICD-10-CM

## 2022-05-06 DIAGNOSIS — I10 Essential (primary) hypertension: Secondary | ICD-10-CM

## 2022-05-06 DIAGNOSIS — E782 Mixed hyperlipidemia: Secondary | ICD-10-CM | POA: Insufficient documentation

## 2022-05-06 DIAGNOSIS — R928 Other abnormal and inconclusive findings on diagnostic imaging of breast: Secondary | ICD-10-CM | POA: Insufficient documentation

## 2022-05-06 DIAGNOSIS — E1165 Type 2 diabetes mellitus with hyperglycemia: Secondary | ICD-10-CM

## 2022-05-06 DIAGNOSIS — N6452 Nipple discharge: Secondary | ICD-10-CM

## 2022-05-06 DIAGNOSIS — E038 Other specified hypothyroidism: Secondary | ICD-10-CM | POA: Insufficient documentation

## 2022-05-06 DIAGNOSIS — N63 Unspecified lump in unspecified breast: Secondary | ICD-10-CM

## 2022-05-06 LAB — CBC WITH DIFF
BASOPHIL #: <0.1 10*3/uL (ref <=0.20)
BASOPHIL %: 1 %
EOSINOPHIL #: 0.36 10*3/uL (ref <=0.50)
EOSINOPHIL %: 6 %
HCT: 34.3 % — ABNORMAL LOW (ref 34.8–46.0)
HGB: 10.7 g/dL — ABNORMAL LOW (ref 11.5–16.0)
IMMATURE GRANULOCYTE #: <0.1 10*3/uL (ref <0.10)
IMMATURE GRANULOCYTE %: 0 % (ref 0–1)
LYMPHOCYTE #: 1.39 10*3/uL (ref 1.00–4.80)
LYMPHOCYTE %: 23 %
MCH: 27.6 pg (ref 26.0–32.0)
MCHC: 31.2 g/dL (ref 31.0–35.5)
MCV: 88.6 fL (ref 78.0–100.0)
MONOCYTE #: 0.54 10*3/uL (ref 0.20–1.10)
MONOCYTE %: 9 %
MPV: 11.9 fL (ref 8.7–12.5)
NEUTROPHIL #: 3.77 10*3/uL (ref 1.50–7.70)
NEUTROPHIL %: 61 %
PLATELETS: 111 10*3/uL — ABNORMAL LOW (ref 150–400)
RBC: 3.87 10*6/uL (ref 3.85–5.22)
RDW-CV: 15.7 % — ABNORMAL HIGH (ref 11.5–15.5)
WBC: 6.1 10*3/uL (ref 3.7–11.0)

## 2022-05-06 LAB — LIPID PANEL
CHOL/HDL RATIO: 3.5
CHOLESTEROL: 167 mg/dL (ref 100–200)
HDL CHOL: 48 mg/dL — ABNORMAL LOW (ref >=50)
LDL CALC: 96 mg/dL (ref <100)
NON-HDL: 119 mg/dL (ref <=190)
TRIGLYCERIDES: 132 mg/dL (ref <150)
VLDL CALC: 21 mg/dL (ref <30)

## 2022-05-06 LAB — THYROID STIMULATING HORMONE (SENSITIVE TSH): TSH: 0.788 u[IU]/mL (ref 0.350–4.940)

## 2022-05-06 LAB — COMPREHENSIVE METABOLIC PANEL, NON-FASTING
ALBUMIN: 3.6 g/dL (ref 3.4–4.8)
ALKALINE PHOSPHATASE: 103 U/L (ref 50–130)
ALT (SGPT): 24 U/L — ABNORMAL HIGH (ref 8–22)
ANION GAP: 10 mmol/L (ref 4–13)
AST (SGOT): 41 U/L (ref 8–45)
BILIRUBIN TOTAL: 0.3 mg/dL (ref 0.3–1.3)
BUN/CREA RATIO: 18 (ref 6–22)
BUN: 15 mg/dL (ref 8–25)
CALCIUM: 9.7 mg/dL (ref 8.6–10.3)
CHLORIDE: 106 mmol/L (ref 96–111)
CO2 TOTAL: 27 mmol/L (ref 23–31)
CREATININE: 0.84 mg/dL (ref 0.60–1.05)
ESTIMATED GFR - FEMALE: 79 mL/min/BSA (ref >=60)
GLUCOSE: 122 mg/dL (ref 65–125)
POTASSIUM: 4.4 mmol/L (ref 3.5–5.1)
PROTEIN TOTAL: 7.4 g/dL (ref 6.0–8.0)
SODIUM: 143 mmol/L (ref 136–145)

## 2022-05-06 LAB — HGA1C (HEMOGLOBIN A1C WITH EST AVG GLUCOSE)
ESTIMATED AVERAGE GLUCOSE: 126 mg/dL
HEMOGLOBIN A1C: 6 % — ABNORMAL HIGH (ref <=5.7)

## 2022-05-06 LAB — MICROALBUMIN/CREATININE RATIO, URINE, RANDOM
CREATININE RANDOM URINE: 57 mg/dL (ref 50–100)
MICROALBUMIN RANDOM URINE: 0.7 mg/dL
MICROALBUMIN/CREATININE RATIO RANDOM URINE: 12.3 mg/g (ref <30.0)

## 2022-05-13 ENCOUNTER — Other Ambulatory Visit: Payer: Self-pay | Admitting: Family Medicine

## 2022-05-13 ENCOUNTER — Ambulatory Visit (HOSPITAL_COMMUNITY): Payer: Self-pay

## 2022-05-13 ENCOUNTER — Other Ambulatory Visit: Payer: Self-pay

## 2022-05-13 ENCOUNTER — Inpatient Hospital Stay
Admission: RE | Admit: 2022-05-13 | Discharge: 2022-05-13 | Disposition: A | Discharge status: Home / Self Care | Payer: Medicare PPO | Source: Ambulatory Visit | Attending: FAMILY MEDICINE | Admitting: FAMILY MEDICINE

## 2022-05-13 DIAGNOSIS — M810 Age-related osteoporosis without current pathological fracture: Secondary | ICD-10-CM | POA: Insufficient documentation

## 2022-05-13 DIAGNOSIS — M8588 Other specified disorders of bone density and structure, other site: Secondary | ICD-10-CM

## 2022-05-13 DIAGNOSIS — K121 Other forms of stomatitis: Secondary | ICD-10-CM

## 2022-05-14 NOTE — Telephone Encounter (Signed)
Requested medication (s) are due for refill today:   Expired by 2 yrs  Requested medication (s) are on the active medication list:   Yes from 2021  Future visit scheduled:   No   Looks like she is established with another PCP   Last ordered: 11/20/2019 120 ml, 0 refills  Returned because no protocol assigned to this medication and not sure she is still a pt. With [facility]   Requested Prescriptions  Pending Prescriptions Disp Refills   chlorhexidine (PERIDEX) 0.12 % solution [Pharmacy Med Name: Chlorhexidine Gluconate 0.12 % Mouth/Throat Solution] 473 mL 0    Sig: RINSE MOUTH WITH 5MLS ONCE DAILY AS NEEDED FOR MOUTH SORES     Off-Protocol Failed - 05/13/2022  4:42 PM      Failed - Medication not assigned to a protocol, review manually.      Failed - Valid encounter within last 12 months    Recent Outpatient Visits           2 years ago Essential hypertension   [place], Devonne Doughty, DO   2 years ago Essential hypertension   [place], DO   3 years ago Type 2 diabetes, controlled, with peripheral neuropathy [facility])   [place], DO   3 years ago Type 2 diabetes, uncontrolled, with neuropathy [facility])   [place], DO   4 years ago Type 2 diabetes, controlled, with peripheral neuropathy [facility])   [place], Devonne Doughty, DO

## 2022-05-19 ENCOUNTER — Ambulatory Visit (HOSPITAL_COMMUNITY): Payer: Self-pay

## 2022-08-03 ENCOUNTER — Other Ambulatory Visit (INDEPENDENT_AMBULATORY_CARE_PROVIDER_SITE_OTHER): Payer: Self-pay | Admitting: FAMILY MEDICINE

## 2022-08-03 DIAGNOSIS — M62838 Other muscle spasm: Secondary | ICD-10-CM

## 2022-08-03 DIAGNOSIS — J410 Simple chronic bronchitis: Secondary | ICD-10-CM

## 2022-08-03 DIAGNOSIS — E1165 Type 2 diabetes mellitus with hyperglycemia: Secondary | ICD-10-CM

## 2022-08-03 MED ORDER — BACLOFEN 10 MG TABLET
10.0000 mg | ORAL_TABLET | Freq: Four times a day (QID) | ORAL | 0 refills | Status: DC
Start: 2022-08-03 — End: 2023-02-27

## 2022-08-03 NOTE — Telephone Encounter (Unsigned)
Pt stated she needs a refill of her baclofen 90 day supply, solution for her nebulizer as well.  She also needs a order for her blood work.       Andres Shad, [facility] [place]

## 2022-08-04 MED ORDER — ALBUTEROL SULFATE 2.5 MG/3 ML (0.083 %) SOLUTION FOR NEBULIZATION
2.5000 mg | INHALATION_SOLUTION | Freq: Four times a day (QID) | RESPIRATORY_TRACT | 1 refills | Status: DC | PRN
Start: 2022-08-04 — End: 2022-09-03

## 2022-08-04 NOTE — Telephone Encounter (Signed)
Pt still needs the solution for her nebulizer.    Andres Shad, [facility] [place]

## 2022-09-02 ENCOUNTER — Ambulatory Visit (INDEPENDENT_AMBULATORY_CARE_PROVIDER_SITE_OTHER): Payer: Self-pay | Admitting: FAMILY MEDICINE

## 2022-09-03 ENCOUNTER — Inpatient Hospital Stay (HOSPITAL_BASED_OUTPATIENT_CLINIC_OR_DEPARTMENT_OTHER)
Admission: RE | Admit: 2022-09-03 | Discharge: 2022-09-03 | Disposition: A | Discharge status: Home / Self Care | Payer: Medicare PPO | Source: Ambulatory Visit | Attending: FAMILY MEDICINE | Admitting: FAMILY MEDICINE

## 2022-09-03 ENCOUNTER — Other Ambulatory Visit: Payer: Self-pay

## 2022-09-03 ENCOUNTER — Ambulatory Visit: Payer: Medicare PPO | Attending: FAMILY MEDICINE | Admitting: FAMILY MEDICINE

## 2022-09-03 ENCOUNTER — Encounter (INDEPENDENT_AMBULATORY_CARE_PROVIDER_SITE_OTHER): Payer: Self-pay | Admitting: FAMILY MEDICINE

## 2022-09-03 ENCOUNTER — Telehealth (INDEPENDENT_AMBULATORY_CARE_PROVIDER_SITE_OTHER): Payer: Self-pay | Admitting: FAMILY MEDICINE

## 2022-09-03 VITALS — BP 118/60 | HR 86 | Temp 97.5°F | Resp 16 | Ht 64.0 in | Wt 215.0 lb

## 2022-09-03 DIAGNOSIS — R600 Localized edema: Secondary | ICD-10-CM

## 2022-09-03 DIAGNOSIS — M25552 Pain in left hip: Secondary | ICD-10-CM

## 2022-09-03 DIAGNOSIS — F321 Major depressive disorder, single episode, moderate: Secondary | ICD-10-CM | POA: Insufficient documentation

## 2022-09-03 DIAGNOSIS — R2689 Other abnormalities of gait and mobility: Secondary | ICD-10-CM

## 2022-09-03 DIAGNOSIS — Z713 Dietary counseling and surveillance: Secondary | ICD-10-CM | POA: Insufficient documentation

## 2022-09-03 DIAGNOSIS — Z7182 Exercise counseling: Secondary | ICD-10-CM | POA: Insufficient documentation

## 2022-09-03 DIAGNOSIS — E038 Other specified hypothyroidism: Secondary | ICD-10-CM | POA: Insufficient documentation

## 2022-09-03 DIAGNOSIS — I8393 Asymptomatic varicose veins of bilateral lower extremities: Secondary | ICD-10-CM | POA: Insufficient documentation

## 2022-09-03 DIAGNOSIS — I83813 Varicose veins of bilateral lower extremities with pain: Secondary | ICD-10-CM

## 2022-09-03 DIAGNOSIS — M79605 Pain in left leg: Secondary | ICD-10-CM | POA: Insufficient documentation

## 2022-09-03 DIAGNOSIS — K1379 Other lesions of oral mucosa: Secondary | ICD-10-CM | POA: Insufficient documentation

## 2022-09-03 DIAGNOSIS — Z87891 Personal history of nicotine dependence: Secondary | ICD-10-CM | POA: Insufficient documentation

## 2022-09-03 DIAGNOSIS — I10 Essential (primary) hypertension: Secondary | ICD-10-CM

## 2022-09-03 DIAGNOSIS — M792 Neuralgia and neuritis, unspecified: Secondary | ICD-10-CM

## 2022-09-03 DIAGNOSIS — Z6836 Body mass index (BMI) 36.0-36.9, adult: Secondary | ICD-10-CM | POA: Insufficient documentation

## 2022-09-03 DIAGNOSIS — K219 Gastro-esophageal reflux disease without esophagitis: Secondary | ICD-10-CM | POA: Insufficient documentation

## 2022-09-03 DIAGNOSIS — Z7984 Long term (current) use of oral hypoglycemic drugs: Secondary | ICD-10-CM | POA: Insufficient documentation

## 2022-09-03 DIAGNOSIS — Z79890 Hormone replacement therapy (postmenopausal): Secondary | ICD-10-CM | POA: Insufficient documentation

## 2022-09-03 DIAGNOSIS — M545 Low back pain, unspecified: Secondary | ICD-10-CM

## 2022-09-03 DIAGNOSIS — E1165 Type 2 diabetes mellitus with hyperglycemia: Secondary | ICD-10-CM | POA: Insufficient documentation

## 2022-09-03 DIAGNOSIS — J410 Simple chronic bronchitis: Secondary | ICD-10-CM | POA: Insufficient documentation

## 2022-09-03 DIAGNOSIS — K0889 Other specified disorders of teeth and supporting structures: Secondary | ICD-10-CM | POA: Insufficient documentation

## 2022-09-03 DIAGNOSIS — F5101 Primary insomnia: Secondary | ICD-10-CM

## 2022-09-03 DIAGNOSIS — M79604 Pain in right leg: Secondary | ICD-10-CM | POA: Insufficient documentation

## 2022-09-03 DIAGNOSIS — E785 Hyperlipidemia, unspecified: Secondary | ICD-10-CM | POA: Insufficient documentation

## 2022-09-03 DIAGNOSIS — Z79899 Other long term (current) drug therapy: Secondary | ICD-10-CM | POA: Insufficient documentation

## 2022-09-03 DIAGNOSIS — E063 Autoimmune thyroiditis: Secondary | ICD-10-CM | POA: Insufficient documentation

## 2022-09-03 MED ORDER — SERTRALINE 100 MG TABLET
100.0000 mg | ORAL_TABLET | Freq: Every day | ORAL | 1 refills | Status: DC
Start: 2022-09-03 — End: 2023-05-04

## 2022-09-03 MED ORDER — ESOMEPRAZOLE MAGNESIUM 40 MG CAPSULE,DELAYED RELEASE
DELAYED_RELEASE_CAPSULE | ORAL | 1 refills | Status: AC
Start: 2022-09-03 — End: ?

## 2022-09-03 MED ORDER — LISINOPRIL 20 MG TABLET
20.0000 mg | ORAL_TABLET | Freq: Two times a day (BID) | ORAL | 1 refills | Status: DC
Start: 2022-09-03 — End: 2023-02-26

## 2022-09-03 MED ORDER — AMITRIPTYLINE 25 MG TABLET: 25.0000 mg | ORAL_TABLET | Freq: Every evening | ORAL | 2 refills | Status: DC

## 2022-09-03 MED ORDER — HYDROCHLOROTHIAZIDE 25 MG TABLET
25.0000 mg | ORAL_TABLET | Freq: Every day | ORAL | 1 refills | Status: DC
Start: 2022-09-03 — End: 2023-06-01

## 2022-09-03 MED ORDER — GABAPENTIN 400 MG CAPSULE
400.0000 mg | ORAL_CAPSULE | Freq: Three times a day (TID) | ORAL | 1 refills | Status: DC
Start: 2022-09-03 — End: 2023-01-05

## 2022-09-03 MED ORDER — MELOXICAM 15 MG TABLET
15.0000 mg | ORAL_TABLET | Freq: Every day | ORAL | 1 refills | Status: DC | PRN
Start: 2022-09-03 — End: 2023-06-01

## 2022-09-03 MED ORDER — CHLORHEXIDINE GLUCONATE 0.12 % MOUTHWASH
15.0000 mL | MOUTHWASH | Freq: Two times a day (BID) | 0 refills | Status: AC
Start: 2022-09-03 — End: 2022-09-10

## 2022-09-03 MED ORDER — LEVOTHYROXINE 175 MCG TABLET
175.0000 ug | ORAL_TABLET | Freq: Every morning | ORAL | 1 refills | Status: DC
Start: 2022-09-03 — End: 2023-05-29

## 2022-09-03 NOTE — Progress Notes (Signed)
FAMILY MEDICINE, MEDICAL OFFICE BUILDING [place] [id number]      Operated by [facility]     Name: Angela Frey MRN:  [id number]   Date: 09/03/2022 Age: 62 y.o.      Chief Complaint   Patient presents with    RPM    Returning Patient Call     Back, hip right leg a place on right forefinger       SUBJECTIVE    Patient is 62 y.o. she  presents today for follow-up on diabetes, hypertension, hyperlipidemia and GERD.  Patient complains she has a place on her right forefinger also her back, hip and right leg.   She states she has been swelling in her leg she will see her vascular doctor Dr.Dooley.  Patient complains of mouth sores that keep coming back and pain in her lips with chewing.  Lesion are warts she reports they have have been frozen before but came back.     BS at home she says is fine around 123. She reports she is taking all her medication as directed and has been trying to eat better. She does not like many vegetables and she does not like many food it she is to be sweets.  Patient reports her blood pressure has been doing well at home.  She does not eat much vegetables she does not like a lot of foods.     She has an appointment with the urologist again she reports the pee meds don;t work too well.    Patient also complains of swelling in her legs up on examination there imil swelling in her lower leg today then she says it will  happens when she stand up a lot.  She proceeds to tell me that she has the pneumatic compression stockings I advised her to use those she said it is hard to because she has to set on the bar stool.  Again you need to set on something more comfortable an use use stockings as directed.  She has varicose veins and want to see vascular she has seen vascular where she was previously living. check abi and doppler and if she has problem after testing will refer her to vascular  She needs her medication refills today    ROS:       ROS - pertinent for presenting problem.  Systems otherwise negative than what has been noted.    PAST MEDICAL HISTORY  I have reviewed and updated as appropriate the past medical, surgical, family, and social history today:    Medical History/Surgical History/Family History/Social History  Past Medical History:   Diagnosis Date    Asthma     Chronic obstructive airway disease (CMS HCC)     Diabetes (CMS [place])     Hypertension     Hypothyroid          Past Surgical History:   Procedure Laterality Date    HX PELVIC LAPAROSCOPY      HX TONSILLECTOMY      HX TUBAL LIGATION      HYSTEROSCOPY WITH DILATION AND CURETTAGE N/A 04/09/2021    Performed by Kathi Der, DO at [place]      2-3 surgeries    NECK SURGERY      TUBOPLASTY / TUBOTUBAL ANASTOMOSIS      ULNAR NERVE REPAIR Bilateral      Family Medical History:    None  Social History     Socioeconomic History    Marital status: Unknown   Tobacco Use    Smoking status: Former     Types: Cigarettes    Smokeless tobacco: Never   Vaping Use    Vaping status: Never Used   Substance and Sexual Activity    Alcohol use: Never    Drug use: Never     Social Determinants of Health     Financial Resource Strain: Low Risk  (03/02/2022)    Financial Resource Strain     SDOH Financial: No   Transportation Needs: Low Risk  (03/02/2022)    Transportation Needs     SDOH Transportation: No   Social Connections: Medium Risk (03/02/2022)    Social Connections     SDOH Social Isolation: 3 to 5 times a week   Intimate Partner Violence: Low Risk  (04/23/2022)    Intimate Partner Violence     SDOH Domestic Violence: No   Housing Stability: Low Risk  (03/02/2022)    Housing Stability     SDOH Housing Situation: I have housing.     SDOH Housing Worry: No       Allergies:  Allergies   Allergen Reactions    Pseudoephedrine Hcl Hives/ Urticaria    Clindamycin Phosphate     Guaifenesin  Other Adverse Reaction (Add comment)    Cleocin [Clindamycin] Hives/ Urticaria     Doxylamin-Pse-Dm-Acetaminophen Hives/ Urticaria and  Other Adverse Reaction (Add comment)    Tessalon [Benzonatate] Hives/ Urticaria       Medications:  Current Outpatient Medications   Medication Sig    amitriptyline (ELAVIL) 25 mg Oral Tablet Take 1 Tablet (25 mg total) by mouth Every night    aspirin 81 mg Oral Tablet, Chewable Chew 1 Tablet (81 mg total) Once a day    baclofen (LIORESAL) 10 mg Oral Tablet Take 1 Tablet (10 mg total) by mouth Four times a day    Blood Sugar Diagnostic (ACCU-CHEK GUIDE TEST STRIPS) Strip 1 Strip by Does not apply route Four times a day    Blood-Glucose Meter Misc 4 times per day    calcium carbonate/vitamin D3 (CALCIUM 600 + D ORAL) Take by mouth Once a day    cholecalciferol, Vitamin D3, 125 mcg (5,000 unit) Oral Tablet Take by oral route.    cyanocobalamin (VITAMIN B 12) 1,000 mcg Oral Tablet Take 1 Tablet (1,000 mcg total) by mouth Once a day    esomeprazole magnesium (NEXIUM) 40 mg Oral Capsule, Delayed Release(E.C.) TAKE 1 CAPSULE BY MOUTH IN THE MORNING BEFORE BREAKFAST    fluorouraciL (EFUDEX) 5 % Cream Apply topically Twice daily Apply to lesions as directed    gabapentin (NEURONTIN) 400 mg Oral Capsule Take 1 Capsule (400 mg total) by mouth Three times a day    hydroCHLOROthiazide (HYDRODIURIL) 25 mg Oral Tablet Take 1 Tablet (25 mg total) by mouth Once a day    ketoconazole (NIZORAL) 2 % Shampoo USE 1/2 TO 1 (ONE-HALF TO ONE) OUNCE OF SHAMPOO TOPICALLY THREE TIMES A WEEK    Lactobac no.41/Bifidobact no.7 (PROBIOTIC-10 ORAL) Take by mouth Once a day    Lancets (ACCU-CHEK SOFTCLIX LANCETS) Misc 1 Each by Does not apply route Four times a day    levothyroxine (SYNTHROID) 175 mcg Oral Tablet Take 1 Tablet (175 mcg total) by mouth Every morning    lisinopriL (PRINIVIL) 20 mg Oral Tablet Take 1 Tablet (20 mg total) by mouth Twice daily    meloxicam (MOBIC) 15  mg Oral Tablet Take 1 Tablet (15 mg total) by mouth Once per day as needed for Pain    metFORMIN (GLUCOPHAGE) 500 mg  Oral Tablet Take 1 tablet by mouth twice daily with food    Milk Thistle 500 mg Oral Capsule Take by mouth Twice daily    nystatin (MYCOSTATIN) 100,000 unit/gram Cream Apply 0.5 g t.i.d. to affected area.    nystatin (NYSTOP) 100,000 unit/gram Powder by Apply Topically route Three times a day as needed    pediatric multivitamins Oral Tablet, Chewable Chew 1 Tablet Once a day    pravastatin (PRAVACHOL) 10 mg Oral Tablet Take 1 Tablet (10 mg total) by mouth Every evening    semaglutide (RYBELSUS) 3 mg Oral Tablet Take 1 Tablet (3 mg total) by mouth Once a day (Patient not taking: Reported on 09/03/2022)    sertraline (ZOLOFT) 100 mg Oral Tablet Take 1 Tablet (100 mg total) by mouth Once a day    Urea 40 % 40 % Cream by Apply Topically route Twice daily Apply a pea sized amount with a q-tip to nails twice daily       Immunizations:  Immunization History   Administered Date(s) Administered    Covid-19 Vaccine,Pfizer-BioNTech,Purple Top,60yr+ 09/22/2019, 10/13/2019    DIPTH,PERTUSSIS-ACEL,TETANUS >10 YRS OLD 06/13/2012    Flu Family 05/28/2013    High-Dose Influenza Vaccine, 65+ 06/01/2011    Influenza Vaccine, 6 month-adult 06/05/2012, 05/21/2014, 05/28/2015, 05/17/2017, 05/14/2019       OBJECTIVE    BP 118/60 (Site: Left, Patient Position: Sitting)   Pulse 86   Temp 36.4 C (97.5 F)   Resp 16   Ht 1.626 m ('5\' 4"'$ )   Wt 97.5 kg (215 lb)   LMP  (LMP Unknown) Comment: stopped having periods at aget 44-46  SpO2 97%   BMI 36.90 kg/m       Physical Exam:  General: Pleasant, WNWD, NAD, AAO  Head: Normocephalic, AT,no lesions. Bruise on right check.    Eyes: PERL, EOM's full, conjunctivae clear, fundi grossly normal.   Nose:  no rhinnorhea noted, no lesions  Throat: Clear, no exudates, no lesions MMM and pink.  Neck: Supple, no masses, no thyromegaly, no bruits.   Chest: Lungs clear, no rales, no rhonchi, no wheezes.   Heart: RRR, no murmurs, no rubs, no gallops.   Abdomen: Soft, no tenderness, no masses, BS normal.     Extremities: Normal gait, no deformities, no edema.    Neuro: Physiological, no localizing findings.   Skin:  Normal PWD,  warts on finger and dry skin otherwise  Psych: Mood and Affect normal      ASSESSMENT:        ICD-10-CM    1. Essential hypertension  I10 hydroCHLOROthiazide (HYDRODIURIL) 25 mg Oral Tablet     lisinopriL (PRINIVIL) 20 mg Oral Tablet      2. Simple chronic bronchitis (CMS HCC)  J41.0 Refer to External Provider      3. Morbid obesity (CMS [place]  E66.01 Refer to External Provider      4. Uncontrolled type 2 diabetes mellitus with hyperglycemia (CMS HCC)  E11.65       5. Neuropathic pain  M79.2 amitriptyline (ELAVIL) 25 mg Oral Tablet     gabapentin (NEURONTIN) 400 mg Oral Capsule      6. Primary insomnia  F51.01 amitriptyline (ELAVIL) 25 mg Oral Tablet      7. Gastroesophageal reflux disease without esophagitis  K21.9 esomeprazole magnesium (NEXIUM) 40 mg Oral Capsule, Delayed Release(E.C.)  8. Hypothyroidism due to Hashimoto's thyroiditis  E03.8 levothyroxine (SYNTHROID) 175 mcg Oral Tablet    E06.3       9. Lumbar pain  M54.50 meloxicam (MOBIC) 15 mg Oral Tablet      10. Moderate major depression (CMS HCC)  F32.1 sertraline (ZOLOFT) 100 mg Oral Tablet      11. Edema of both lower extremities  R60.0 ANKLE BRACHIAL INDICES     93980 - PENILE VESSEL DUPLEX OF ARTERIAL INFLOW/VENOUS OUTFLOW; COMPLETE (AMB ONLY-PD)      12. Pain in both lower extremities  M79.604 ANKLE BRACHIAL INDICES    M79.605 93980 - PENILE VESSEL DUPLEX OF ARTERIAL INFLOW/VENOUS OUTFLOW; COMPLETE (AMB ONLY-PD)      13. Tooth pain with chewing  K08.89       14. Mouth sore  K13.79       15. Varicose veins of both lower extremities with pain  I83.813           Body mass index is 36.9 kg/m. BMI addressed: Advised on diet, weight loss, and exercise to reduce above normal BMI.        Laboratory studies/data reviewed  I have reviewed all available and pertinent laboratory studies, images and health  maintenance.    PLAN:    Records reviewed today:  Refill needed meds. ATTACHED  Pt was educated/ counseled on the decisions made today with their involvement in these plans.  Immunizations:  Encourage COVID booster  Preventive counseling:  Continue to monitor blood sugars and blood pressures  Diet and exercise reviewed  See dentist and eye doctor with regular scheduled visits.  Active listening/Asked pertinent questions.    A total of (34) minutes was spent on this patient encounter including review of historical information, examination, documentation and post visit activities.  Follow-up with all specialists  Refill hydrochlorothiazide take as directed hypertension monitor BP at home and in office   Order abi and doppler lower legs edema varicose veins   Refer ENT for evaluation sores in mouth  Refill levothyroxine take as directed hypothyroidism monitor lab  Refill Zoloft take as directed mood continue to monitor  Refill meloxicam take as directed the right is pool follow-up back scheduled follow-up with your ankle special as will  Refill Nexium take as directed GERD monitor for symptom control  Refill Elavil can directed week goal to monitor for symptom control  Refill gabapentin take as directed pull will monitor for symptom control  Order left hip xray will call with results  Have labs completed   Refill chlorhexidine use as directed   Continue all prescribed medications as directed    BMI addressed: Advised on diet, weight loss, and exercise to reduce above normal BMI.    DMII - Follow a diabetic diet such as the ADA diet (American Diabetes Association). Take all medication as directed. Exercise and loose weight because even a 10 lb weight loss can make a big difference in your blood sugar. Keep an exercise log and record your weight once a week. Keep a blood sugar log and bring this to all appointments.  Recognize signs, symptoms, and treatment of hypoglycemia (treatment:  Glucose of any form of  glucose-containing carbohydrate).  Control blood pressure and in here with antihypertensive medications.  Follow a low-carbohydrate low-fat diet, especially low in saturated fats.  Lower sodium intake.  Get regular aerobic exercise with a goal of at least 150 minutes per week of moderate intensity exercises.  Maintain a healthy body weight.  HTN Hypertension - Follow a low fat , low cholesterol diet. Gradually work up to 30 mins a day of exercise with a goal of exercising 3 to 6 days a week. Take all medication as directed. Follow a heart healthy diet and loose weight. BMI goal of 18.5-24.9 kg/m2. Keep a blood pressure log and bring this log to all appointments. .     Hyperlipidemia - Follow a low fat and low cholesterol diet. Exercise start slowly with the goal of exercising 3 to 5 days a week for at least 30 mins per day. Eating five small meals a day is better than three large meals. Keep an exercise log an bring it to your appointments.     OBESITY - start an exercise program and gradually work up to 30 mins to one hour 5 days a week. IF your can't do that much 10 minutes 3 times a day. Keep a calorie counting log and weigh yourself once a week. Join a waling group it can make exercise more fun. Join a support group. Bring your logs to all visits. Eat a high fiber low fat lower calorie diet.     Hypothyroidism  The patient is to continue to take it thyroid supplement.  Labs will be followed to follow the therapy.  Thyroid hormone replacement therapy is a very individualized treatment process, and is highly effective when prescribed properly.  The goal of the thyroid hormone replacement therapy, in most cases, is to normalize your thyroid stimulating hormone (TSH) levels.  Since you are prescribed a form of thyroid hormone replacement therapy, the goal is to compensate for the lack of hormone secreted by your thyroid gland.  You will take a daily dose of T4 in a pill taken orally.  It is important to understand  that every patient's therapy will be different.  There is no cookie cutter dosage or treatment plan and when it comes to thyroid hormone replacement therapy.  Held the body absorbs the hormone, along with the amount of hormones it needs, is very.  Your treatment plan will be individualized.  Such as, you should expect some degree of experimentation when it comes to finding the dosage in form of therapy that works best for you.    FOLLOW UP:  Return in about 5 months (around 02/16/2023) for medicare wellness exam .    Patient can return sooner if needed.      The patient/care give was given ample opportunity to ask questions and those questions were answered to his/her satisfaction. A good faith effort was made to reconcile the patient's medications. The patient/caregiver was counseled on any appropriate vaccinations by the provider and questions were answered. The patient/care giver was told to contact me with any additional questions or concerns, or go to the ED in an emergency.       Kerry Dory, DO. 09/03/22 15:32  [facility]  [address]  [place], [place] [address]  Phone [phone]    This note may have been partially generated using M-Modal Fluency Direct system, and there may be some incorrect words, spellings, and punctuation that were not noted in checking the note before saving.

## 2022-09-03 NOTE — Telephone Encounter (Signed)
Rain called and said someone called over here and said that it isn't time for my blood work. I said yes mam that was me. She said Dr. Wyvonnia Lora does my blood work is done every three months. I said I talked to Dr. Wyvonnia Lora and she said it is not time for your labs. She started telling me what blood work she was supposed to have and I said Dr. Wyvonnia Lora said that your labs are not do. Rain said, I dont care what Dr. Wyvonnia Lora said tell her what I said. I said I certainly will.   I spoke to Dr Wyvonnia Lora and she said her labs are not due for 2 more months and her insurance is not going to pay for them. So I called Rain back and told her that she has 2 more months before her insurance would pay for it. She said fine and hung up. Comments added by Ludwig Lean, MA on 09/03/22 at 16:35.

## 2022-09-05 DIAGNOSIS — M533 Sacrococcygeal disorders, not elsewhere classified: Secondary | ICD-10-CM

## 2022-09-05 DIAGNOSIS — M160 Bilateral primary osteoarthritis of hip: Secondary | ICD-10-CM

## 2022-09-09 ENCOUNTER — Other Ambulatory Visit (INDEPENDENT_AMBULATORY_CARE_PROVIDER_SITE_OTHER): Payer: Self-pay | Admitting: FAMILY MEDICINE

## 2022-09-09 ENCOUNTER — Telehealth (INDEPENDENT_AMBULATORY_CARE_PROVIDER_SITE_OTHER): Payer: Self-pay | Admitting: FAMILY MEDICINE

## 2022-09-09 DIAGNOSIS — M25552 Pain in left hip: Secondary | ICD-10-CM

## 2022-09-09 DIAGNOSIS — M545 Low back pain, unspecified: Secondary | ICD-10-CM

## 2022-09-09 NOTE — Telephone Encounter (Signed)
Pt states that she will do physical therapy.        [name], CMA

## 2022-09-10 ENCOUNTER — Other Ambulatory Visit (INDEPENDENT_AMBULATORY_CARE_PROVIDER_SITE_OTHER): Payer: Self-pay | Admitting: FAMILY MEDICINE

## 2022-09-10 DIAGNOSIS — R928 Other abnormal and inconclusive findings on diagnostic imaging of breast: Secondary | ICD-10-CM

## 2022-09-21 ENCOUNTER — Other Ambulatory Visit (INDEPENDENT_AMBULATORY_CARE_PROVIDER_SITE_OTHER): Payer: Self-pay | Admitting: FAMILY MEDICINE

## 2022-09-21 DIAGNOSIS — R600 Localized edema: Secondary | ICD-10-CM

## 2022-09-29 ENCOUNTER — Ambulatory Visit (HOSPITAL_COMMUNITY): Payer: Self-pay

## 2022-09-30 ENCOUNTER — Ambulatory Visit (HOSPITAL_BASED_OUTPATIENT_CLINIC_OR_DEPARTMENT_OTHER): Payer: Self-pay

## 2022-09-30 ENCOUNTER — Ambulatory Visit (HOSPITAL_COMMUNITY): Payer: Self-pay

## 2022-10-12 ENCOUNTER — Ambulatory Visit (HOSPITAL_COMMUNITY): Payer: Self-pay

## 2022-10-28 ENCOUNTER — Other Ambulatory Visit (INDEPENDENT_AMBULATORY_CARE_PROVIDER_SITE_OTHER): Payer: Self-pay | Admitting: FAMILY MEDICINE

## 2022-10-28 DIAGNOSIS — E782 Mixed hyperlipidemia: Secondary | ICD-10-CM

## 2022-10-28 DIAGNOSIS — E1165 Type 2 diabetes mellitus with hyperglycemia: Secondary | ICD-10-CM

## 2022-11-11 ENCOUNTER — Telehealth (INDEPENDENT_AMBULATORY_CARE_PROVIDER_SITE_OTHER): Payer: Self-pay | Admitting: FAMILY MEDICINE

## 2022-11-11 ENCOUNTER — Other Ambulatory Visit (INDEPENDENT_AMBULATORY_CARE_PROVIDER_SITE_OTHER): Payer: Self-pay | Admitting: FAMILY MEDICINE

## 2022-11-11 DIAGNOSIS — M25552 Pain in left hip: Secondary | ICD-10-CM

## 2022-11-11 DIAGNOSIS — R0781 Pleurodynia: Secondary | ICD-10-CM

## 2022-11-11 DIAGNOSIS — M549 Dorsalgia, unspecified: Secondary | ICD-10-CM

## 2022-11-11 NOTE — Telephone Encounter (Signed)
Angela Frey with [facility] needs a new order for [facility], the one they have is expired.    [name], [address] [place]

## 2022-11-15 ENCOUNTER — Encounter (HOSPITAL_BASED_OUTPATIENT_CLINIC_OR_DEPARTMENT_OTHER): Payer: Self-pay

## 2022-11-15 ENCOUNTER — Inpatient Hospital Stay (HOSPITAL_COMMUNITY)
Admission: RE | Admit: 2022-11-15 | Discharge: 2022-11-15 | Disposition: A | Discharge status: Home / Self Care | Payer: Medicare PPO | Source: Ambulatory Visit | Attending: FAMILY MEDICINE | Admitting: FAMILY MEDICINE

## 2022-11-15 ENCOUNTER — Inpatient Hospital Stay
Admission: RE | Admit: 2022-11-15 | Discharge: 2022-11-15 | Disposition: A | Discharge status: Home / Self Care | Payer: Medicare PPO | Source: Ambulatory Visit | Attending: FAMILY MEDICINE | Admitting: FAMILY MEDICINE

## 2022-11-15 ENCOUNTER — Ambulatory Visit (HOSPITAL_BASED_OUTPATIENT_CLINIC_OR_DEPARTMENT_OTHER)
Admission: RE | Admit: 2022-11-15 | Discharge: 2022-11-15 | Disposition: A | Discharge status: Still In Hospital | Payer: Medicare PPO | Source: Ambulatory Visit | Attending: FAMILY MEDICINE | Admitting: FAMILY MEDICINE

## 2022-11-15 ENCOUNTER — Other Ambulatory Visit: Payer: Self-pay

## 2022-11-15 DIAGNOSIS — R0781 Pleurodynia: Secondary | ICD-10-CM | POA: Insufficient documentation

## 2022-11-15 DIAGNOSIS — M25552 Pain in left hip: Secondary | ICD-10-CM | POA: Insufficient documentation

## 2022-11-15 DIAGNOSIS — R600 Localized edema: Secondary | ICD-10-CM

## 2022-11-15 DIAGNOSIS — M79605 Pain in left leg: Secondary | ICD-10-CM

## 2022-11-15 DIAGNOSIS — M545 Low back pain, unspecified: Secondary | ICD-10-CM

## 2022-11-15 DIAGNOSIS — M549 Dorsalgia, unspecified: Secondary | ICD-10-CM | POA: Insufficient documentation

## 2022-11-15 NOTE — PT Evaluation (Signed)
[facility]  Back Evaluation         Name: Angela Frey, 62 y.o., female  MRN: [id number]  DOB: Apr 15, 1961  Estimated body mass index is 36.9 kg/m as calculated from the following:    Height as of 09/03/22: 1.626 m (5\' 4" ).    Weight as of 09/03/22: 97.5 kg (215 lb).  Date of Service: 11/15/2022     Physical Therapy Evaluation    Diagnosis:   Pain of left hip [M25.552]      Rib pain [R07.81]      Back pain, unspecified back location, unspecified back pain laterality, unspecified chronicity [M54.9]         Referral:   Hip and back pain increase strength and ROM       Primary Care Physician: Bing Plume, DO     Referring Provider: Camelia Eng, DO     History of Injury/onset of problem/description of symptoms:  Pt is a 62 y.o. female that presents to the clinic with low back pain and L hip pain. Pt reports that her pain intensifies and states her pain has been horrible for 5 years. Pt reports she has low back pain and has L hip pain some times. Pt reports she gets tingling some times in her L arm. Pt reports concerned that she may have bladder cancer- states she read about it being associated with back pain- pt reports that she is going to talk to her urologist at her next visit. Pt reports that she has fallen before because of her pain. Pt reports her hip has gotten caught a couple of times- usually when her legs are bent and up. Pt reports that she gets Baclofen and Gabapentin for the severity of pain in her ankle pain.   Other symptoms/areas of concern:   Pt reports she also gets bad headaches. Pt reports she had severe ankel pain when she fell off of a roof. Pt reports she was bedridden for a long time after that injury.  Prior injuries to the area: No, denies injuries to her low back; pt reports she injured her legs falling off a roof 11 years ago   Daily Activities:   Pt reports that she gets up and goes to the kitchen and sit. 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 Pt  reports she has a lot of cats. Pt reports watches TV and sweeps. Pt reports she will have to sit and take a break.   Functional limitations/aggravating factors:   Sweeping  Everything   Walking   Alleviating factors/what helps:   Sitting   Sleep: Pt reports that it does not bother her sleep at night.   Description of Pain: can't explain it   Location of Pain: Pt reports that she has L sided low back pain. Pt reports not have much pain on her R side.   Frequency of Pain: with activity; goes away   Level of Pain: (0 - 10)     Currently: 0    Best: 0    Worst: "1000"   Diagnostic Tests:   XR HIP LEFT W PELVIS 2-3 VIEWS performed on 09/03/2022 4:02 PM.     REASON FOR EXAM:  M25.552: Pain of left hip  R26.89: Limping     TECHNIQUE: 2 views/2 images submitted for interpretation.     COMPARISON:  None     IMPRESSION:  Patient has mild degenerative changes in both hips. No fracture or dislocation seen. No gross  joint effusion seen. SI joints show mild degenerative change as well as the symphysis pubis.      MRI SPINE LUMBOSACRAL WO CONTRAST performed on 02/28/2020 11:31 AM.     REASON FOR EXAM:  M54.5: Lumbar pain  M54.9: Tenderness of back     TECHNIQUE: Multiplanar, multisequence MR images were performed through the  lumbar spine without intravenous contrast administration     COMPARISON: Lumbar spine radiographs of February 20, 2020     FINDINGS:    The paraspinal musculature is unremarkable. There is no prevertebral soft  tissue swelling or fluid. The included intra-abdominal viscera demonstrate  no acute process or concerning mass. Vertebral body height and alignment  are preserved. There is straightening of the expected lumbar lordosis. The  conus medullaris ends at the L1-2 level and there is no clumping of the  nerve roots of the cauda equina. There are mild degenerative disc changes  within the lumbar spine with mild decreased T2 disc signal compatible with  mild desiccation and minimal osteophyte formation. There is  mild disc bulge  at L3-4 and L4-5. Disc material abuts the exiting left L3 nerve root  without evidence of compression. The L5-S1 intervertebral disc is normal in  signal. There are mild-to-moderate degenerative facet changes at L3-4 and  L4-5. There is no significant spinal canal or neural foraminal stenosis.     IMPRESSION:   Mild degenerative spondylosis of the lumbar spine as above. No significant  spinal canal or neural foraminal stenosis.      XR LUMBAR SPINE SERIES performed on 02/20/2020 3:03 PM.     REASON FOR EXAM:  M54.9: Back pain, unspecified back location, unspecified  back pain laterality, unspecified chronicity     TECHNIQUE: 5 views/5 images submitted for interpretation.     COMPARISON:  None.     FINDINGS:  5 lumbar vertebral segments are identified. There is no  substantial lateral curvature. Lumbar lordosis appears straightened. There  is multilevel mild endplate osteophyte formation and disc height narrowing.  Multilevel facet arthropathy.     Vascular calcifications are noted. There is retained bowel gas and stool  projecting over the abdomen and pelvis.     IMPRESSION:     Straightening of the normal lumbar lordosis, probably chronic. Multilevel  lumbar spondylosis and degenerative disc disease. Consider follow-up CT/MRI  if there is point tenderness or neurological symptoms.     Prior treatment to the area: No  Going to Chiropractor?: No - has appointment today at 4; seeing Dr. Greggory Stallion   PMH/medical screening questions:  Pacemaker or electrical device in body? No  Blood thinners? No, takes an Aspirin   Visual changes since onset? No  Chest pain? No, states chest hurts when she tries to walk; has COPD and emphysema   Difficulty breathing? Yes, has COPD emphysema   Difficulty swallowing? No  Dizziness (rooming spinning)?No  Lightheadedness (pass out)? No  Bowel and bladder changes since onset? Yes, can't hold anything   Headaches? Yes, states she gets bad headaches   Allergies? Teslon pearls,  Clindamycin  History of fx? Yes, R leg  Personal history of cancer? 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 No  History of blood clots? No  Seizures? No  Unexplained weight loss? No  Loss of appetite? No    Pt's main goal from physical therapy:  Pt wants to get rid of the pain in her back.  Past Medical History:   Diagnosis Date    Asthma     Chronic obstructive airway disease (CMS HCC)     Diabetes (CMS HCC)     Hypertension     Hypothyroid           Past Surgical History:   Procedure Laterality Date    HX PELVIC LAPAROSCOPY      HX TONSILLECTOMY      HX TUBAL LIGATION      LEG SURGERY      2-3 surgeries    NECK SURGERY      TUBOPLASTY / TUBOTUBAL ANASTOMOSIS      ULNAR NERVE REPAIR Bilateral    Neck surgery - in '88 or '89 checking lump underneath her neck as an exploratory surgery.         Objective    Gait: Pt ambulates with no assistive device independently with no loss of balance   Selective Functional Movement Assessment (SFMA)  Key: FN= functional, nonpainful, DN= dysfunctional, nonpainful, FP= Functional, Painful, DP=Dysfunctional Painful)    Cervical Flexion: DN  Can't touch sternum to chin   non-uniform spine curve   excessive effort and/or lack of motor control    Cervical Extension: DN  Not within 10 degrees of parallel  non-uniform spine curve  excessive effort and/or lack of motor control    Cervical R rotation: DN  Chin/nose not in line with mid-clavicle  Excessive effort and/or appreciable asymmetry or lack of motor control    Cervical L rotation: DP  Chin/nose not in line with mid-clavicle  Excessive effort and/or appreciable asymmetry or lack of motor control      Multi-segmental Flexion: DP  Cannot touch toes  Sacral angle < 70 degrees   Non-uniform spine curve  Lack of posterior weight shift  Excessive effort and/or appreciable asymmetry or lack of motor control     Multi-segmental Extension: DP   Upper extremity does not  achieve or maintain 170 degrees  ASIS does not clear toes  Spine of scapula does not clear heels  Non-uniform spine curve  Excessive effort and/or lack motor control    Multi-segmental R rotation: DP  Pelvis rotation < 50 degrees   Torso rotation < 50 degrees   Excessive effort and/or lack of symmetry or motor control    Multi-segmental L rotation: DN  Pelvis rotation < 50 degrees   Torso rotation < 50 degrees   Excessive effort and/or lack of symmetry or motor control      Hip AROM:   Flexion:         R:   106               L:  114             Comment:     IR:                 R:   27               L:     23          Comment:   ER:                R:    34              L:    43           Comment:     Supine FABER:                   (-)  bilaterally  Scour's test: (-) bilaterally       Active Straight Leg Raise: L: 87 R: 71  Oswestry Low Back Pain Disability Questionnaire: 40%   Palpation:   Lumbar paraspinals: mild tissue tone to palpation bilaterally   Lower thoracic paraspinals: moderate to high tissue tone to palpation bilaterally      Assessment:Pt is a 62 y.o. female that presents to the clinic with low back pain  Null for medical red flag findings at this time based on subjective reports and lack of red flag findings. Yellow flags for PMH cancer (removed- not active) and HTN. Pt is a candidate for physical therapy based on movement deficits identified during the objective exam like movement deficits identified during the Selective Functional Movement Assessment and hip IR hypomobility that could be contributing to symptoms reported and difficulty with functional and recreational activities reported like household related activities. Pt could benefit from skilled physical therapy services like guided and supervised movement interventions like neuromuscular re-education activities to improve motor control and stability and therapeutic exercises to improve functional mobility, strength and endurance, and manual  therapy interventions to address joint mobility and soft tissue extensiblity deficits to address functional limitations, participation restrictions and to address goals that are set to ultimately allow pt to return to prior level of function safely and with less difficulty.        Short Term Goals  In 3-4 weeks:  1) Pt will be independent in a HEP for long term management of symptoms and function.   2) Pt will score 28% or less on the Oswestry Low Back Pain Disability Questionnaire.       Long Term Goals  In 6-8 weeks:  1) Pt will score 16% or less on the Oswestry Low Back Pain Disability Questionnaire.   2) Pt will demonstrate at least 32 degrees hip IR AROM bilaterally to improve the pt's ability to perform household related activities with less difficulty.   3)  Pt will demonstrate nonpainful multisegmental flexion per Selective Functional Movement Assessment to improve the pt's ability to perform self care and household related activities with less difficulty.   4) Pt will demonstrate nonpainful multisegmental rotation bilaterally per Selective Functional Movement Assessment to improve the pt's ability to ambulate with less difficulty.   5) Pt will give subjective reports of at least 75% improvement in symptoms.     Plan:Treatment plan will involve the following interventions:  (Neuromuscular re-education, therapeutic exercise, manual therapy, modalities, pt education)    Neuromuscular re-education to improve motor control and stability.  Therapeutic exercise to improve functional strength, endurance and mobility.   Manual therapy to improve tissue extensibility and joint mobility.   Modalities if appropriate for pain modulation and to improve tolerance to activity and movement interventions.   Pt education on subjects like a home exercise program for independent management, plan of care, prognosis, and treatment plan.   Treatment plan will also involve any deficits identified in regional interdependent regions  possibly contributing to symptoms like the thoracic spine and hips to ultimately help reduce symptoms and improve the pt's ability to perform functional and recreational activities without difficulty.            Belia Heman, DPT  #[id number] 11/15/2022 14:10  Frequency/Duration: 1-2x per week for 6-8 weeks     POC Dates:  11/15/22- 01/10/23  Physician name:  Camelia Eng, DO       Physicians Signature: ___________________________________ Date: ___________Time______

## 2022-11-16 ENCOUNTER — Ambulatory Visit (HOSPITAL_COMMUNITY): Payer: Self-pay

## 2022-11-16 NOTE — PT Treatment (Signed)
[facility]- [facility]  PT Back Treatment      Patient Name: Angela Frey  Patient DOB: 10-Feb-1961    Referring physician: Bing Plume, DO    Return to Doctor:     Diagnosis:   Pain of left hip [M25.552]      Rib pain [R07.81]      Back pain, unspecified back location, unspecified back pain laterality, unspecified chronicity [M54.9]                   Referral:   Hip and back pain increase strength and ROM       Physical Therapy Visit    Time in:   Time out:   Visit #: 1 (initial evaluation)   PROGRESS UPDATE BY 10th VISIT OR 01/10/23   POC Due: 01/10/23    Did PT communicate with PTA:  Yes  Precautions/Restrictions: Chronic low back pain; reports not much L hip pain     1.) PMH: asthma, COPD, Diabetes, HTN, hypothyroid, pelvic laparoscopy, leg surgery (2-3 surgeries), neck surgery (removal of a lump), ulnar nerve repair   2.) reports with COPD has difficulty breathing, had skin cancer (removed)     Soft tissue mobilization: Yes, as tolerated   Electrical stimulation: Yes, Neubie low back protocol, TENS   Ultra sound: Yes, 20%   Exercise precautions:  as tolerated; keep in mind PMH   Treatment Note      Subjective:     Objective: All treatment performed one-on-one except as noted with "unattended" description. Continued skilled care in alignment with goals. Pt participates in session to address low back pain.       Therapeutic Exercise:  minutes  Neuromuscular Reeducation  minutes  Manual Therapy:  minutes  Ultrasound:  minutes  Total tx time minutes           Therapeutic Exercise: progressive/ graded exercises supervised by a skilled physical therapist or physical therapy assistant to improve pt's functional strength, endurance, and mobility of the  trunk and LE  to improve pt's ability to perform functional and recreational activities with less difficulty     *none performed today     Neuromuscular Re-education: progressive supervised exercises by a skilled physical  therapist or physical therapy assistant to improve motor control / correct movement compensations and/or to improve proprioception  of the trunk and LE to improve pt's ability to perform ADLs and IADLs safely, with more independence, and with less difficulty.     *none performed today     Manual Therapy: to improve tissue extensibility and/or joint mobility to ultimately reduce symptoms, promote function, and/or improve tolerance to movement interventions in POC     *none performed today     Modalities: performed by a skilled physical therapist or physical therapy assistant to modulate pain and/or improve tissue extensibility and/or reduce tone and/or to reduce swelling to ultimately improve pt's ability to perform ADLs and IADLs with less symptoms and difficulty and to also improve pt's tolerance to progressive movement interventions     *none performed today       Assessment: (see evaluation)     Plan:     Check myotomal strength at next session with primary PT   Treatment plan will involve the following interventions:  (Neuromuscular re-education, therapeutic exercise, manual therapy, modalities, pt education)     Neuromuscular re-education to improve motor control and stability.  Therapeutic exercise to improve functional strength, endurance and mobility.  Manual therapy to improve tissue extensibility and joint mobility.   Modalities if appropriate for pain modulation and to improve tolerance to activity and movement interventions.   Pt education on subjects like a home exercise program for independent management, plan of care, prognosis, and treatment plan.   Treatment plan will also involve any deficits identified in regional interdependent regions possibly contributing to symptoms like the thoracic spine and hips to ultimately help reduce symptoms and improve the pt's ability to perform functional and recreational activities without difficulty.     Goals:    Short Term Goals  In 3-4 weeks:  1) Pt will be  independent in a HEP for long term management of symptoms and function.   2) Pt will score 28% or less on the Oswestry Low Back Pain Disability Questionnaire.         Long Term Goals  In 6-8 weeks:  1) Pt will score 16% or less on the Oswestry Low Back Pain Disability Questionnaire.   2) Pt will demonstrate at least 32 degrees hip IR AROM bilaterally to improve the pt's ability to perform household related activities with less difficulty.   3)  Pt will demonstrate nonpainful multisegmental flexion per Selective Functional Movement Assessment to improve the pt's ability to perform self care and household related activities with less difficulty.   4) Pt will demonstrate nonpainful multisegmental rotation bilaterally per Selective Functional Movement Assessment to improve the pt's ability to ambulate with less difficulty.   5) Pt will give subjective reports of at least 75% improvement in symptoms.           Home Exercise Program History (HEP)       Exercises/Modalities Not Performed This Treatment:              Belia Heman, DPT  [phone]

## 2022-11-17 ENCOUNTER — Telehealth (INDEPENDENT_AMBULATORY_CARE_PROVIDER_SITE_OTHER): Payer: Self-pay | Admitting: NURSE PRACTITIONER

## 2022-11-17 NOTE — Telephone Encounter (Signed)
Conducting chart pulls for 11/23/2022.    Pt's last office visit was on 10/29/2020; pt also had a sleep study along with a titration and was previously placed on CPAP. Per [facility], pt had refused to be set-up on CPAP machine and never f/u with our office after her testing. Pt also had a LDCT in 2022 but has not had one completed since then as well. Noting for general documentation for upcoming appt.    Verlon Au, [place]  11/17/2022  10:11 AM

## 2022-11-23 ENCOUNTER — Ambulatory Visit (INDEPENDENT_AMBULATORY_CARE_PROVIDER_SITE_OTHER): Payer: Self-pay | Admitting: NURSE PRACTITIONER

## 2022-11-23 NOTE — Progress Notes (Deleted)
[facility]  [address]  [place] [address]  Operated by [facility]     Follow up/Progress Note    Patient Name: Angela Frey  Date: 11/23/2022  Department:  [facility] [name]  MRN: [id number]  DOB: February 26, 1961  Primary Care Provider:  Bing Plume, DO  Referring Provider:  No ref. provider found      Chief Complaint: No chief complaint on file.    There are no exam notes on file for this visit.    HPI:    Patient with a history of chronic respiratory failure with hypoxia, shortness of breath, RLD, emphysema, and OSA who is here for follow up.  She was last seen on 10/29/2020.    She was initially seen as a new patient on 10/29/2020 for COPD and shortness of breath. She reported a history of childhood asthma that had progressed over the years and had moderate to severe shortness of breath. She reported previously being on O2, but had to turn in her O2 due to finanical issues.     PFTs on 10/29/2020 showed normal spirometry, mild restriction on lung volumes only, moderately decreased DLCO.    She reported previously being diagnosed with OSA in [place].  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  She was titrated to BiPAP 13/9 cm H2O.  She never followed up after this.    She remains on Spiriva, generic Symbicort, and albuterol prn.     She previously smoked 1 ppd for about 35 years.  She quit in 2015.  She previously worked as a Financial trader and was around [employer].        Past Medical History:  Past Medical History:   Diagnosis Date    Asthma     Chronic obstructive airway disease (CMS HCC)     Diabetes (CMS HCC)     Hypertension     Hypothyroid          Past Surgical History  Past Surgical History:   Procedure Laterality Date    HX PELVIC LAPAROSCOPY      HX TONSILLECTOMY      HX TUBAL LIGATION      LEG SURGERY      2-3 surgeries    NECK SURGERY      TUBOPLASTY  / TUBOTUBAL ANASTOMOSIS      ULNAR NERVE REPAIR Bilateral        Medication List  Current Outpatient Medications   Medication Sig    amitriptyline (ELAVIL) 25 mg Oral Tablet Take 1 Tablet (25 mg total) by mouth Every night    aspirin 81 mg Oral Tablet, Chewable Chew 1 Tablet (81 mg total) Once a day    baclofen (LIORESAL) 10 mg Oral Tablet Take 1 Tablet (10 mg total) by mouth Four times a day    Blood Sugar Diagnostic (ACCU-CHEK GUIDE TEST STRIPS) Strip 1 Strip by Does not apply route Four times a day    Blood-Glucose Meter Misc 4 times per day    calcium carbonate/vitamin D3 (CALCIUM 600 + D ORAL) Take by mouth Once a day    cholecalciferol, Vitamin D3, 125 mcg (5,000 unit) Oral Tablet Take by oral route.    cyanocobalamin (VITAMIN B 12) 1,000 mcg Oral Tablet Take 1 Tablet (1,000 mcg total) by mouth Once a day    esomeprazole magnesium (NEXIUM) 40 mg Oral Capsule, Delayed Release(E.C.) TAKE 1  CAPSULE BY MOUTH IN THE MORNING BEFORE BREAKFAST    fluorouraciL (EFUDEX) 5 % Cream Apply topically Twice daily Apply to lesions as directed    gabapentin (NEURONTIN) 400 mg Oral Capsule Take 1 Capsule (400 mg total) by mouth Three times a day    hydroCHLOROthiazide (HYDRODIURIL) 25 mg Oral Tablet Take 1 Tablet (25 mg total) by mouth Once a day    ketoconazole (NIZORAL) 2 % Shampoo USE 1/2 TO 1 (ONE-HALF TO ONE) OUNCE OF SHAMPOO TOPICALLY THREE TIMES A WEEK    Lactobac no.41/Bifidobact no.7 (PROBIOTIC-10 ORAL) Take by mouth Once a day    Lancets (ACCU-CHEK SOFTCLIX LANCETS) Misc 1 Each by Does not apply route Four times a day    levothyroxine (SYNTHROID) 175 mcg Oral Tablet Take 1 Tablet (175 mcg total) by mouth Every morning    lisinopriL (PRINIVIL) 20 mg Oral Tablet Take 1 Tablet (20 mg total) by mouth Twice daily    meloxicam (MOBIC) 15 mg Oral Tablet Take 1 Tablet (15 mg total) by mouth Once per day as needed for Pain    metFORMIN (GLUCOPHAGE) 500 mg Oral Tablet Take 1 tablet by mouth twice daily with food    Milk Thistle  500 mg Oral Capsule Take by mouth Twice daily    nystatin (MYCOSTATIN) 100,000 unit/gram Cream Apply 0.5 g t.i.d. to affected area.    nystatin (NYSTOP) 100,000 unit/gram Powder by Apply Topically route Three times a day as needed    pediatric multivitamins Oral Tablet, Chewable Chew 1 Tablet Once a day    pravastatin (PRAVACHOL) 10 mg Oral Tablet TAKE 1 TABLET BY MOUTH ONCE DAILY IN THE EVENING    sertraline (ZOLOFT) 100 mg Oral Tablet Take 1 Tablet (100 mg total) by mouth Once a day    tolterodine (DETROL LA) 4 mg Oral Capsule, Sust. Release 24 hr Take 1 Capsule (4 mg total) by mouth Once a day    Urea 40 % 40 % Cream by Apply Topically route Twice daily Apply a pea sized amount with a q-tip to nails twice daily     Allergy List  Allergy History as of 11/23/22       CLINDAMYCIN         Noted Status Severity Type Reaction    09/10/19 1855 Marjie Skiff, NP 09/10/19 Active Low  Hives/ Urticaria              DOXYLAMIN-PSE-DM-ACETAMINOPHEN         Noted Status Severity Type Reaction    07/09/20 0731 Rodney Booze, LPN 30/86/57 Active Low  Hives/ Urticaria,  Other Adverse Reaction (Add comment)    09/10/19 1857 Marjie Skiff, NP 09/10/19 Active Low  Hives/ Urticaria              BENZONATATE         Noted Status Severity Type Reaction    02/20/20 1313 Rodney Booze, LPN 84/69/62 Active Low  Hives/ Urticaria              CLINDAMYCIN PHOSPHATE         Noted Status Severity Type Reaction    11/04/20 1619 Overmiller, Joesph Fillers, MD 11/04/20 Active                 PSEUDOEPHEDRINE HCL         Noted Status Severity Type Reaction    04/23/22 1357 Sharen Hones, CMA 11/11/14 Active High  Hives/ Urticaria  GUAIFENESIN         Noted Status Severity Type Reaction    04/23/22 1357 Sharen Hones, CMA 05/05/18 Active    Other Adverse Reaction (Add comment)                  Family History   Family Medical History:    None         Social History  Social History     Socioeconomic History    Marital status:  Unknown   Tobacco Use    Smoking status: Former     Types: Cigarettes    Smokeless tobacco: Never   Vaping Use    Vaping status: Never Used   Substance and Sexual Activity    Alcohol use: Never    Drug use: Never     Social Determinants of Health     Financial Resource Strain: Low Risk  (03/02/2022)    Financial Resource Strain     SDOH Financial: No   Transportation Needs: Low Risk  (03/02/2022)    Transportation Needs     SDOH Transportation: No   Social Connections: Medium Risk (03/02/2022)    Social Connections     SDOH Social Isolation: 3 to 5 times a week   Intimate Partner Violence: Low Risk  (04/23/2022)    Intimate Partner Violence     SDOH Domestic Violence: No   Housing Stability: Low Risk  (03/02/2022)    Housing Stability     SDOH Housing Situation: I have housing.     SDOH Housing Worry: No          Objective:  Vital Signs  LMP  (LMP Unknown) Comment: stopped having periods at [facility] 44-46         PHYSICAL EXAMINATION:   Constitutional:  Vital signs stable.  General appearance of the patient:  Alert, no acute distress.  Normal appearance, well nourished.  Ears, Nose, Mouth, and Throat: External inspection of ears and nose with normal appearance.  Inspection of lips, teeth and gums with normal appearance and oral mucosa normal.  Neck: Supple with trachea midline.  Respiratory:  Auscultation of lungs with normal breath sounds, no rales, no rhonchi, no wheezing.  Respiratory effort with no tractions, breathing regular and unlabored.  Cardiovascular:  Regular rhythm and regular rate.    Gastrointestinal: Abdomen non-distended.  Musculoskeletal:  Normal gait and station, normal digits, no digital cyanosis or clubbing.  Mental Status/Psychiatric:  Alert, grossly oriented to person, place, and time.  Appropriate and normal mood.    Assessment  No diagnosis found.    Imaging  XR HIP LEFT W PELVIS 2-3 VIEWS  Narrative: [place] Dec  Female, 62 years old.    XR HIP LEFT W PELVIS 2-3 VIEWS performed on 09/03/2022  4:02 PM.    REASON FOR EXAM:  M25.552: Pain of left hip  R26.89: Limping    TECHNIQUE: 2 views/2 images submitted for interpretation.    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 No fracture or dislocation seen. No gross joint effusion seen. SI joints show mild degenerative change as well as the symphysis pubis.    Radiologist location ID: [id number]      Plan      Patient was seen and examined by Dr. Marland Kitchen  He was present for the majority of the visit.  He is in agreement with the note and plan unless otherwise noted on his addendum.      No follow-ups on  file.     The patient was given the opportunity to ask questions and those questions were answered to the patient's satisfaction. The patient was encouraged to call with any additional questions or concerns. Discussed with the patient effects and side effects of medications. Medication safety was discussed.  The patient was informed to contact the office within 7 business days if a message/lab results/referral/imaging results have not been conveyed to the patient.    Electronically signed by Hezzie Bump, FNP-C      This note may have been partially generated using MModal Fluency Direct system, and there may be some incorrect words, spellings, and punctuation that were not noted in checking the note before saving.

## 2022-11-26 ENCOUNTER — Ambulatory Visit (HOSPITAL_BASED_OUTPATIENT_CLINIC_OR_DEPARTMENT_OTHER)
Admission: RE | Admit: 2022-11-26 | Discharge: 2022-11-26 | Disposition: A | Discharge status: Still In Hospital | Payer: Medicare PPO | Source: Ambulatory Visit | Attending: FAMILY MEDICINE | Admitting: FAMILY MEDICINE

## 2022-11-26 ENCOUNTER — Other Ambulatory Visit: Payer: Self-pay

## 2022-11-26 NOTE — PT Treatment (Signed)
[facility]  PT Back Treatment      Patient Name: Angela Frey  Patient DOB: January 11, 1961    Referring physician: Bing Plume, DO    Return to Doctor:     Diagnosis:   Pain of left hip [M25.552]      Rib pain [R07.81]      Back pain, unspecified back location, unspecified back pain laterality, unspecified chronicity [M54.9]                   Referral:   Hip and back pain increase strength and ROM       Physical Therapy Visit    Time in: 1605 *Pt 20 minutes late*  Time out: 1635  Visit #: 2   PROGRESS UPDATE BY 10th VISIT OR 01/10/23   POC Due: 01/10/23    Did PT communicate with PTA:  Yes  Precautions/Restrictions: Chronic low back pain; reports not much L hip pain     1.) PMH: asthma, COPD, Diabetes, HTN, hypothyroid, pelvic laparoscopy, leg surgery (2-3 surgeries), neck surgery (removal of a lump), ulnar nerve repair   2.) reports with COPD has difficulty breathing, had skin cancer (removed)     Soft tissue mobilization: Yes, as tolerated   Electrical stimulation: Yes, Neubie low back protocol, TENS   Ultra sound: Yes, 20%   Exercise precautions:  as tolerated; keep in mind PMH   Treatment Note      Subjective: Pt reports 5/10 low back and bilateral shoulder pain. Pt having an eye irritation currently.     Objective: All treatment performed one-on-one except as noted with "unattended" description. Continued skilled care in alignment with goals. Pt participates in session to address low back pain.       Therapeutic Exercise: 5  minutes + Subjective   Neuromuscular Reeducation: 25  minutes (2 units)  Manual Therapy:  minutes  Ultrasound:  minutes  Total tx time: 30 minutes (2 units)           Therapeutic Exercise: progressive/ graded exercises supervised by a skilled physical therapist or physical therapy assistant to improve pt's functional strength, endurance, and mobility of the  trunk and LE  to improve pt's ability to perform functional and recreational  activities with less difficulty     Seated Hamstring Stretch: x1 minute each LE (added today)    Neuromuscular Re-education: progressive supervised exercises by a skilled physical therapist or physical therapy assistant to improve motor control / correct movement compensations and/or to improve proprioception  of the trunk and LE to improve pt's ability to perform ADLs and IADLs safely, with more independence, and with less difficulty.        Neubi Mapping/Training Protocol for low back  (Purpose: decrease painful inputs to central nervous system while improving kinesthetic awareness of the core to restore normal movement patterns such as bending, turning, rolling over)  25 minutes (Neuromuscular Reeducation)(all time is one-on-one including set up, education and intensity adjustments by skilled neubie certified clinician)  500hz   Intensity: 13% progressed to 15%  Channel 1:2x4-sized pad with Red/positive polarity placed L Lumbar Paraspinals.                   2x4-sized pad with Black/negative polarity placed L SI.  Channel 2:2x4-sized pad with Red/positive polarity placed R Lumbar Paraspinal.                   2x4-sized pad with Black/negative polarity placed  R SI.  One-on-one treatment for set-up and education with the goal of reaching 5/10 pain/intensity or greater if tolerated for Central nervous system retraining goals while being guided through the following exercises:    SKTC 10x each  PPT 2x10  Clams 10x each LE  Reverse clams 10x each LE  LAQ 10x    Not performed today:  Bridges 2x10  Hip hinge 2x10     Manual Therapy: to improve tissue extensibility and/or joint mobility to ultimately reduce symptoms, promote function, and/or improve tolerance to movement interventions in POC     *none performed today     Modalities: performed by a skilled physical therapist or physical therapy assistant to modulate pain and/or improve tissue extensibility and/or reduce tone and/or to reduce swelling to ultimately improve  pt's ability to perform ADLs and IADLs with less symptoms and difficulty and to also improve pt's tolerance to progressive movement interventions     *none performed today       Assessment: *Pt was 20 minutes late to PT appointment. Educated pt due to lateness, it will be shorten session. Pt acknowledged understanding and requested to completed PT session. "I have already drive so far."  Initiated session with seated hamstring stretch with strap with okay pt response and tolerance. Pt easily discouraged. Reassurance provided. Initiated neubie low back protocol for the purpose to decrease painful inputs to central nervous system while improving kinesthetic awareness of the core to restore normal movement patterns such as bending, turning, rolling over. Pt with okay response to neubie protocol. Tolerated very minimum increase intensity during protocol. Decreased repetitions completed during neubie for pt tolerance. Pt reports increase hip discomfort during exercises. Pt recovered with rest and agreeable to continue. Pt require assistance to safely completed transfer sitting edge of mat table. Due to pt's lateness to session, unable to complete much this session. Will continue per POC.     Plan:     Check myotomal strength at next session with primary PT   Treatment plan will involve the following interventions:  (Neuromuscular re-education, therapeutic exercise, manual therapy, modalities, pt education)     Neuromuscular re-education to improve motor control and stability.  Therapeutic exercise to improve functional strength, endurance and mobility.   Manual therapy to improve tissue extensibility and joint mobility.   Modalities if appropriate for pain modulation and to improve tolerance to activity and movement interventions.   Pt education on subjects like a home exercise program for independent management, plan of care, prognosis, and treatment plan.   Treatment plan will also involve any deficits identified in  regional interdependent regions possibly contributing to symptoms like the thoracic spine and hips to ultimately help reduce symptoms and improve the pt's ability to perform functional and recreational activities without difficulty.     Goals:    Short Term Goals  In 3-4 weeks:  1) Pt will be independent in a HEP for long term management of symptoms and function.   2) Pt will score 28% or less on the Oswestry Low Back Pain Disability Questionnaire.         Long Term Goals  In 6-8 weeks:  1) Pt will score 16% or less on the Oswestry Low Back Pain Disability Questionnaire.   2) Pt will demonstrate at least 32 degrees hip IR AROM bilaterally to improve the pt's ability to perform household related activities with less difficulty.   3)  Pt will demonstrate nonpainful multisegmental flexion per Selective Functional Movement Assessment to improve the  pt's ability to perform self care and household related activities with less difficulty.   4) Pt will demonstrate nonpainful multisegmental rotation bilaterally per Selective Functional Movement Assessment to improve the pt's ability to ambulate with less difficulty.   5) Pt will give subjective reports of at least 75% improvement in symptoms.           Home Exercise Program History (HEP)       Exercises/Modalities Not Performed This Treatment:              Arloa Koh, PTA [phone]

## 2022-11-29 ENCOUNTER — Other Ambulatory Visit: Payer: Self-pay

## 2022-11-29 ENCOUNTER — Ambulatory Visit
Admission: RE | Admit: 2022-11-29 | Discharge: 2022-11-29 | Disposition: A | Discharge status: Still In Hospital | Payer: Medicare PPO | Source: Ambulatory Visit | Attending: FAMILY MEDICINE | Admitting: FAMILY MEDICINE

## 2022-11-29 NOTE — PT Treatment (Signed)
[facility]  PT Back Treatment      Patient Name: Angela Frey  Patient DOB: [birth year]    Referring physician: Bing Plume, DO    Return to Doctor:     Diagnosis:   Pain of left hip [M25.552]      Rib pain [R07.81]      Back pain, unspecified back location, unspecified back pain laterality, unspecified chronicity [M54.9]                   Referral:   Hip and back pain increase strength and ROM       Physical Therapy Visit    Time in: 1632  Time out: 1722  Visit #: 3   PROGRESS UPDATE BY 10th VISIT OR 01/10/23   POC Due: 01/10/23    Did PT communicate with PTA:  Yes  Precautions/Restrictions: Chronic low back pain; reports not much L hip pain     1.) PMH: asthma, COPD, Diabetes, HTN, hypothyroid, pelvic laparoscopy, leg surgery (2-3 surgeries), neck surgery (removal of a lump), ulnar nerve repair   2.) reports with COPD has difficulty breathing, had skin cancer (removed)     Soft tissue mobilization: Yes, as tolerated   Electrical stimulation: Yes, Neubie low back protocol, TENS   Ultra sound: Yes, 20%   Exercise precautions:  as tolerated; keep in mind PMH   Treatment Note      Subjective: Pt reports 30/10 RLE, and 6/10 back pain. Pt noted her sister showed her multiple model homes this session.     Objective: All treatment performed one-on-one except as noted with "unattended" description. Continued skilled care in alignment with goals. Pt participates in session to address low back pain.       Therapeutic Exercise: 20 minutes + Subjective and HEP education (1 unit)  Neuromuscular Reeducation: 30 minutes (2 units)  Manual Therapy:  minutes  Ultrasound:  minutes  Total tx time: 50 minutes (3 units)           Therapeutic Exercise: progressive/ graded exercises supervised by a skilled physical therapist or physical therapy assistant to improve pt's functional strength, endurance, and mobility of the  trunk and LE  to improve pt's ability to perform functional and  recreational activities with less difficulty     *none performed today    Not performed today:  Seated Hamstring Stretch: x1 minute each LE (added today)    Neuromuscular Re-education: progressive supervised exercises by a skilled physical therapist or physical therapy assistant to improve motor control / correct movement compensations and/or to improve proprioception  of the trunk and LE to improve pt's ability to perform ADLs and IADLs safely, with more independence, and with less difficulty.        Neubi Mapping/Training Protocol for low back  (Purpose: decrease painful inputs to central nervous system while improving kinesthetic awareness of the core to restore normal movement patterns such as bending, turning, rolling over)  30 minutes (Neuromuscular Reeducation)(all time is one-on-one including set up, education and intensity adjustments by skilled neubie certified clinician)  500hz   Intensity: 12% progressed to 16%  Channel 1:2x4-sized pad with Red/positive polarity placed L Lumbar Paraspinals.                   2x4-sized pad with Black/negative polarity placed L SI.  Channel 2:2x4-sized pad with Red/positive polarity placed R Lumbar Paraspinal.  2x4-sized pad with Black/negative polarity placed R SI.  One-on-one treatment for set-up and education with the goal of reaching 5/10 pain/intensity or greater if tolerated for Central nervous system retraining goals while being guided through the following exercises:    SKTC 2x10 each  PPT 2x10  Bridges 10x  Clams 2x10 each LE  Reverse clams 2x10 each LE  LAQ 20x each LE  Hip hinge 2x10     Manual Therapy: to improve tissue extensibility and/or joint mobility to ultimately reduce symptoms, promote function, and/or improve tolerance to movement interventions in POC     *none performed today     Modalities: performed by a skilled physical therapist or physical therapy assistant to modulate pain and/or improve tissue extensibility and/or reduce tone  and/or to reduce swelling to ultimately improve pt's ability to perform ADLs and IADLs with less symptoms and difficulty and to also improve pt's tolerance to progressive movement interventions     *none performed today       Assessment: Pt refused to do anything with her RLE this session. Pt refused to initiate NuStep at this time. Though pt noted she tolerated the neubie protocol good and agreeable to continue with this intervention. Continued with neubie low back protocol this session with okay pt response. Added bridges and hip hinge to protocol this session with good pt response. Multiple verbal cues provided to maintain pt's focus on PT task throughout session. Pt appropriately responded with given time. Pt required multiple rest breaks throughout session due to LE cramping. Pt recovered slowly with given time. Pt denies any sharp pain during session, though pt noted she has pain all the time. Pt at start of session reported 30/10 RLE pain, observed pt completing RLE exercises during neubie protocol WFL this date with no significant difficulty. Pt completed change of position with slow movement. Pt required assistance to safely completed transfer sitting edge of mat table. Created account with Medbridge with pt's consent. Created HEP. Provided pt with handout and reviewed with pt, pt acknowledged understanding. Intervention time shorten this date due to pt proving VERY extensive subjective reports throughout whole of session. Will continue per POC.     Plan:     Check myotomal strength at next session with primary PT   Treatment plan will involve the following interventions:  (Neuromuscular re-education, therapeutic exercise, manual therapy, modalities, pt education)     Neuromuscular re-education to improve motor control and stability.  Therapeutic exercise to improve functional strength, endurance and mobility.   Manual therapy to improve tissue extensibility and joint mobility.   Modalities if appropriate  for pain modulation and to improve tolerance to activity and movement interventions.   Pt education on subjects like a home exercise program for independent management, plan of care, prognosis, and treatment plan.   Treatment plan will also involve any deficits identified in regional interdependent regions possibly contributing to symptoms like the thoracic spine and hips to ultimately help reduce symptoms and improve the pt's ability to perform functional and recreational activities without difficulty.     Goals:    Short Term Goals  In 3-4 weeks:  1) Pt will be independent in a HEP for long term management of symptoms and function.   2) Pt will score 28% or less on the Oswestry Low Back Pain Disability Questionnaire.         Long Term Goals  In 6-8 weeks:  1) Pt will score 16% or less on the Oswestry Low Back Pain Disability  Questionnaire.   2) Pt will demonstrate at least 32 degrees hip IR AROM bilaterally to improve the pt's ability to perform household related activities with less difficulty.   3)  Pt will demonstrate nonpainful multisegmental flexion per Selective Functional Movement Assessment to improve the pt's ability to perform self care and household related activities with less difficulty.   4) Pt will demonstrate nonpainful multisegmental rotation bilaterally per Selective Functional Movement Assessment to improve the pt's ability to ambulate with less difficulty.   5) Pt will give subjective reports of at least 75% improvement in symptoms.           Home Exercise Program History (HEP)   Access Code: [email or web address]  URL: [email or web address]  Date: 11/29/2022  Prepared by: Arloa Koh    Exercises  - Supine Quad Set  - 2 x daily - 7 x weekly - 2 sets - 10 reps - 3 sec hold  - Supine Posterior Pelvic Tilt  - 2 x daily - 7 x weekly - 2 sets - 10 reps - 3 sec hold  - Supine Single Knee to Chest Stretch  - 2 x daily - 7 x weekly - 2 sets - 10 reps - 5 sec hold  - Bent Knee Fallouts  - 2 x daily - 7 x  weekly - 2 sets - 10 reps - 3 sec hold  - Seated Long Arc Quad  - 2 x daily - 7 x weekly - 2 sets - 20 reps - 3 sec hold    Exercises/Modalities Not Performed This Treatment:              Arloa Koh, PTA [phone]

## 2022-12-02 ENCOUNTER — Encounter (INDEPENDENT_AMBULATORY_CARE_PROVIDER_SITE_OTHER): Payer: Self-pay | Admitting: NURSE PRACTITIONER

## 2022-12-02 ENCOUNTER — Other Ambulatory Visit (INDEPENDENT_AMBULATORY_CARE_PROVIDER_SITE_OTHER): Payer: Self-pay | Admitting: PULMONARY DISEASE

## 2022-12-02 ENCOUNTER — Ambulatory Visit: Payer: Medicare PPO | Attending: NURSE PRACTITIONER | Admitting: NURSE PRACTITIONER

## 2022-12-02 ENCOUNTER — Inpatient Hospital Stay (HOSPITAL_BASED_OUTPATIENT_CLINIC_OR_DEPARTMENT_OTHER)
Admission: RE | Admit: 2022-12-02 | Discharge: 2022-12-02 | Disposition: A | Discharge status: Home / Self Care | Payer: Medicare PPO | Source: Ambulatory Visit | Attending: NURSE PRACTITIONER | Admitting: NURSE PRACTITIONER

## 2022-12-02 ENCOUNTER — Other Ambulatory Visit: Payer: Self-pay

## 2022-12-02 ENCOUNTER — Telehealth (INDEPENDENT_AMBULATORY_CARE_PROVIDER_SITE_OTHER): Payer: Self-pay | Admitting: NURSE PRACTITIONER

## 2022-12-02 VITALS — BP 118/60 | HR 92 | Resp 16 | Ht 64.0 in | Wt 213.0 lb

## 2022-12-02 DIAGNOSIS — R0602 Shortness of breath: Secondary | ICD-10-CM | POA: Insufficient documentation

## 2022-12-02 DIAGNOSIS — G4733 Obstructive sleep apnea (adult) (pediatric): Secondary | ICD-10-CM

## 2022-12-02 DIAGNOSIS — Z87891 Personal history of nicotine dependence: Secondary | ICD-10-CM | POA: Insufficient documentation

## 2022-12-02 DIAGNOSIS — I6529 Occlusion and stenosis of unspecified carotid artery: Secondary | ICD-10-CM | POA: Insufficient documentation

## 2022-12-02 DIAGNOSIS — E119 Type 2 diabetes mellitus without complications: Secondary | ICD-10-CM | POA: Insufficient documentation

## 2022-12-02 DIAGNOSIS — J432 Centrilobular emphysema: Secondary | ICD-10-CM | POA: Insufficient documentation

## 2022-12-02 DIAGNOSIS — Z6836 Body mass index (BMI) 36.0-36.9, adult: Secondary | ICD-10-CM | POA: Insufficient documentation

## 2022-12-02 DIAGNOSIS — I10 Essential (primary) hypertension: Secondary | ICD-10-CM | POA: Insufficient documentation

## 2022-12-02 DIAGNOSIS — D122 Benign neoplasm of ascending colon: Secondary | ICD-10-CM | POA: Insufficient documentation

## 2022-12-02 DIAGNOSIS — R011 Cardiac murmur, unspecified: Secondary | ICD-10-CM | POA: Insufficient documentation

## 2022-12-02 DIAGNOSIS — J9611 Chronic respiratory failure with hypoxia: Secondary | ICD-10-CM | POA: Insufficient documentation

## 2022-12-02 DIAGNOSIS — E1159 Type 2 diabetes mellitus with other circulatory complications: Secondary | ICD-10-CM | POA: Insufficient documentation

## 2022-12-02 DIAGNOSIS — K635 Polyp of colon: Secondary | ICD-10-CM | POA: Insufficient documentation

## 2022-12-02 DIAGNOSIS — J984 Other disorders of lung: Secondary | ICD-10-CM | POA: Insufficient documentation

## 2022-12-02 MED ORDER — ALBUTEROL SULFATE HFA 90 MCG/ACTUATION AEROSOL INHALER
1.0000 | INHALATION_SPRAY | RESPIRATORY_TRACT | 5 refills | Status: DC | PRN
Start: 2022-12-02 — End: 2023-11-22
  Filled 2022-12-02: qty 8.5, 17d supply, fill #0

## 2022-12-02 MED ORDER — BUDESONIDE-FORMOTEROL HFA 160 MCG-4.5 MCG/ACTUATION AEROSOL INHALER
2.0000 | INHALATION_SPRAY | Freq: Two times a day (BID) | RESPIRATORY_TRACT | 5 refills | Status: DC
Start: 2022-12-02 — End: 2023-03-10
  Filled 2022-12-02: qty 10.2, 30d supply, fill #0

## 2022-12-02 NOTE — Telephone Encounter (Signed)
She had titration study 04/2021 that showed need for Bipap 13/9 cmh20.  She never got her machine due to cost.  Will you please send orders?  Thanks!
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
## 2022-12-02 NOTE — Nursing Note (Signed)
Is the patient currently on oxygen therapy? Yes  What Liter Flow is the patient on? 3LPM  What DME company does the patient utilize for oxygen therapy? Offline   What is the patient on? n/a  What DME company does the patient utilize for CPAP/BiPAP/Trilogy? N/A  Smoking History:    Social History     Tobacco Use   Smoking Status Former    Types: Cigarettes   Smokeless Tobacco Never      Have you received a flu shot? Yes  Have you received a pneumonia shot? Yes  Has the patient had any tests performed since the last visit? None  If so, where were the test(s) performed? N/A  Has the patient had any recent hospitalizations? No  Does the patient have any other associated symptoms or modifying factors? Shortness of breath, Wheezing, and Coughing      Lavonia Drafts, MA

## 2022-12-03 ENCOUNTER — Ambulatory Visit (HOSPITAL_BASED_OUTPATIENT_CLINIC_OR_DEPARTMENT_OTHER): Payer: Self-pay

## 2022-12-07 ENCOUNTER — Inpatient Hospital Stay (HOSPITAL_BASED_OUTPATIENT_CLINIC_OR_DEPARTMENT_OTHER)
Admission: RE | Admit: 2022-12-07 | Discharge: 2022-12-07 | Disposition: A | Discharge status: Home / Self Care | Payer: Medicare PPO | Source: Ambulatory Visit | Attending: FAMILY MEDICINE | Admitting: FAMILY MEDICINE

## 2022-12-07 ENCOUNTER — Inpatient Hospital Stay
Admission: RE | Admit: 2022-12-07 | Discharge: 2022-12-07 | Disposition: A | Discharge status: Home / Self Care | Payer: Medicare PPO | Source: Ambulatory Visit | Attending: FAMILY MEDICINE | Admitting: FAMILY MEDICINE

## 2022-12-07 ENCOUNTER — Ambulatory Visit
Admission: RE | Admit: 2022-12-07 | Discharge: 2022-12-07 | Disposition: A | Discharge status: Still In Hospital | Payer: Medicare PPO | Source: Ambulatory Visit | Attending: FAMILY MEDICINE | Admitting: FAMILY MEDICINE

## 2022-12-07 ENCOUNTER — Inpatient Hospital Stay (HOSPITAL_COMMUNITY)
Admission: RE | Admit: 2022-12-07 | Discharge: 2022-12-07 | Disposition: A | Discharge status: Home / Self Care | Payer: Medicare PPO | Source: Ambulatory Visit | Attending: FAMILY MEDICINE | Admitting: FAMILY MEDICINE

## 2022-12-07 ENCOUNTER — Other Ambulatory Visit: Payer: Self-pay

## 2022-12-07 DIAGNOSIS — M79605 Pain in left leg: Secondary | ICD-10-CM

## 2022-12-07 DIAGNOSIS — M25552 Pain in left hip: Secondary | ICD-10-CM | POA: Insufficient documentation

## 2022-12-07 DIAGNOSIS — R928 Other abnormal and inconclusive findings on diagnostic imaging of breast: Secondary | ICD-10-CM

## 2022-12-07 DIAGNOSIS — R600 Localized edema: Secondary | ICD-10-CM

## 2022-12-07 DIAGNOSIS — M549 Dorsalgia, unspecified: Secondary | ICD-10-CM | POA: Insufficient documentation

## 2022-12-07 DIAGNOSIS — R0781 Pleurodynia: Secondary | ICD-10-CM | POA: Insufficient documentation

## 2022-12-07 DIAGNOSIS — M79604 Pain in right leg: Secondary | ICD-10-CM

## 2022-12-07 LAB — PERIPHERAL ARTERY DUPLEX - LOWER (PART 2 OF 2)
Immediate ABI left: 1.02
Left CFA dist sys PSV: 103.6 cm/s
Left ant tibial dis sys PSV: 94.4 cm/s
Left popliteal dist sys PSV: 103.6 cm/s
Left popliteal prox sys PSV: 78.8 cm/s
Left post tibial dis sys PSV: 101.7 cm/s
Left super femoral dist sys PSV: 129 cm/s
Left super femoral mid sys PSV: 113.9 cm/s
Left super femoral prox sys PSV: 113.9 cm/s
Right ABI: 0.98
Right CFA dist sys PSV: 154.2 cm/s
Right popliteal dist sys PSV: 158.8 cm/s
Right popliteal prox sys PSV: 134.9 cm/s
Right super femoral dist sys PSV: 123 cm/s
Right super femoral mid sys PSV: 124.2 cm/s
Right super femoral prox sys PSV: 160 cm/s
Rt ant tibial dis sys PSV: 111.8 cm/s
Rt post tibial dis sys PSV: 110.9 cm/s

## 2022-12-07 NOTE — PT Treatment (Signed)
[place] [facility]- [facility]  PT Back Treatment      Patient Name: Angela Frey  Patient DOB: January 30, 1961    Referring physician: Bing Plume, DO    Return to Doctor:     Diagnosis:   Pain of left hip [M25.552]      Rib pain [R07.81]      Back pain, unspecified back location, unspecified back pain laterality, unspecified chronicity [M54.9]                   Referral:   Hip and back pain increase strength and ROM       Physical Therapy Visit    Time in: 1025  Time out: 1101  Visit #: 4 PROGRESS UPDATE BY 10th VISIT OR 01/10/23   POC Due: 01/10/23    Did PT communicate with PTA:  Yes  Precautions/Restrictions: Chronic low back pain; reports not much L hip pain     1.) PMH: asthma, COPD, Diabetes, HTN, hypothyroid, pelvic laparoscopy, leg surgery (2-3 surgeries), neck surgery (removal of a lump), ulnar nerve repair   2.) reports with COPD has difficulty breathing, had skin cancer (removed)     Soft tissue mobilization: Yes, as tolerated   Electrical stimulation: Yes, Neubie low back protocol, TENS   Ultra sound: Yes, 20%   Exercise precautions:  as tolerated; keep in mind PMH   Treatment Note      Subjective: Patient approached Pt reports 5-6/10 RLE, and 6/10 back pain.     Objective: All treatment performed one-on-one except as noted with "unattended" description. Continued skilled care in alignment with goals. Pt participates in session to address low back pain.       Therapeutic Exercise:   Neuromuscular Reeducation: 36 minutes (2 units)  Manual Therapy:  minutes  Ultrasound:  minutes  Total tx time: 36 minutes (2 units)           Therapeutic Exercise: progressive/ graded exercises supervised by a skilled physical therapist or physical therapy assistant to improve pt's functional strength, endurance, and mobility of the  trunk and LE  to improve pt's ability to perform functional and recreational activities with less difficulty     *none performed today    Not performed  today:  Seated Hamstring Stretch: x1 minute each LE (added today)    Neuromuscular Re-education: progressive supervised exercises by a skilled physical therapist or physical therapy assistant to improve motor control / correct movement compensations and/or to improve proprioception  of the trunk and LE to improve pt's ability to perform ADLs and IADLs safely, with more independence, and with less difficulty.        Neubi Mapping/Training Protocol for low back  (Purpose: decrease painful inputs to central nervous system while improving kinesthetic awareness of the core to restore normal movement patterns such as bending, turning, rolling over)  36 minutes (Neuromuscular Reeducation)(all time is one-on-one including set up, education and intensity adjustments by skilled neubie certified clinician)  500hz   Intensity: 16% progressed to 26 % decreased to 24%.   Channel 1:2x4-sized pad with Red/positive polarity placed L Lumbar Paraspinals.                   2x4-sized pad with Black/negative polarity placed L SI.  Channel 2:2x4-sized pad with Red/positive polarity placed R Lumbar Paraspinal.                   2x4-sized pad with Black/negative polarity placed R SI.  One-on-one treatment for set-up and education with the goal of reaching 5/10 pain/intensity or greater if tolerated for Central nervous system retraining goals while being guided through the following exercises:    SKTC 10x each  PPT 2x10  Bridges 10x  Clams 2x10 each LE  Reverse clams 2x10 each LE  LAQ 20x each LE  Hip hinge 2x10     Manual Therapy: to improve tissue extensibility and/or joint mobility to ultimately reduce symptoms, promote function, and/or improve tolerance to movement interventions in POC     *none performed today     Modalities: performed by a skilled physical therapist or physical therapy assistant to modulate pain and/or improve tissue extensibility and/or reduce tone and/or to reduce swelling to ultimately improve pt's ability to perform  ADLs and IADLs with less symptoms and difficulty and to also improve pt's tolerance to progressive movement interventions     *none performed today       Assessment: Pt called back to start session from waiting area. She believes she is supposed to see Enos Fling, PT and surprised to see this PTA. I educated her that Morrie Sheldon is off on Tuesdays and she is on my schedule for today. This PTA was not sure if pt needed to be reassessed or had not been doing well with PT and when I asked she states "why do you ask that" and pt was emotional. I gave her the option if she wanted to be seen by me or hold today's session. Pt agreed to be seen and even talked to clerk about before coming back. Pt completed Neubie low back protocol She moves foot thru out treatment, due to prior medical issues. Extra time needed to complete exercises and time to complete positional changes and lead pulled out of one pad during transitioning on table.  Neubie only completed this date. Will continue per POC. Pt next several appointments are back with primary PT.     Plan:     Check myotomal strength at next session with primary PT   Treatment plan will involve the following interventions:  (Neuromuscular re-education, therapeutic exercise, manual therapy, modalities, pt education)     Neuromuscular re-education to improve motor control and stability.  Therapeutic exercise to improve functional strength, endurance and mobility.   Manual therapy to improve tissue extensibility and joint mobility.   Modalities if appropriate for pain modulation and to improve tolerance to activity and movement interventions.   Pt education on subjects like a home exercise program for independent management, plan of care, prognosis, and treatment plan.   Treatment plan will also involve any deficits identified in regional interdependent regions possibly contributing to symptoms like the thoracic spine and hips to ultimately help reduce symptoms and improve the pt's  ability to perform functional and recreational activities without difficulty.     Goals:    Short Term Goals  In 3-4 weeks:  1) Pt will be independent in a HEP for long term management of symptoms and function.   2) Pt will score 28% or less on the Oswestry Low Back Pain Disability Questionnaire.         Long Term Goals  In 6-8 weeks:  1) Pt will score 16% or less on the Oswestry Low Back Pain Disability Questionnaire.   2) Pt will demonstrate at least 32 degrees hip IR AROM bilaterally to improve the pt's ability to perform household related activities with less difficulty.   3)  Pt will demonstrate nonpainful multisegmental flexion per  Selective Functional Movement Assessment to improve the pt's ability to perform self care and household related activities with less difficulty.   4) Pt will demonstrate nonpainful multisegmental rotation bilaterally per Selective Functional Movement Assessment to improve the pt's ability to ambulate with less difficulty.   5) Pt will give subjective reports of at least 75% improvement in symptoms.           Home Exercise Program History (HEP)   Access Code: [email or web address]  URL: [email or web address]  Date: 11/29/2022  Prepared by: Arloa Koh    Exercises  - Supine Quad Set  - 2 x daily - 7 x weekly - 2 sets - 10 reps - 3 sec hold  - Supine Posterior Pelvic Tilt  - 2 x daily - 7 x weekly - 2 sets - 10 reps - 3 sec hold  - Supine Single Knee to Chest Stretch  - 2 x daily - 7 x weekly - 2 sets - 10 reps - 5 sec hold  - Bent Knee Fallouts  - 2 x daily - 7 x weekly - 2 sets - 10 reps - 3 sec hold  - Seated Long Arc Quad  - 2 x daily - 7 x weekly - 2 sets - 20 reps - 3 sec hold    Exercises/Modalities Not Performed This Treatment:              Criss Rosales, PTA [phone]

## 2022-12-09 ENCOUNTER — Ambulatory Visit (INDEPENDENT_AMBULATORY_CARE_PROVIDER_SITE_OTHER): Payer: Self-pay | Admitting: PODIATRIST-FOOT AND ANKLE SURGERY

## 2022-12-13 ENCOUNTER — Ambulatory Visit
Admission: RE | Admit: 2022-12-13 | Discharge: 2022-12-13 | Disposition: A | Discharge status: Still In Hospital | Payer: Medicare PPO | Source: Ambulatory Visit | Attending: FAMILY MEDICINE | Admitting: FAMILY MEDICINE

## 2022-12-13 ENCOUNTER — Other Ambulatory Visit: Payer: Self-pay

## 2022-12-13 ENCOUNTER — Telehealth (INDEPENDENT_AMBULATORY_CARE_PROVIDER_SITE_OTHER): Payer: Self-pay | Admitting: FAMILY MEDICINE

## 2022-12-13 NOTE — PT Treatment (Signed)
[place] [facility]- [facility]  PT Back Treatment      Patient Name: Angela Frey  Patient DOB: February 14, 1961    Referring physician: Bing Plume, DO    Return to Doctor:     Diagnosis:   Pain of left hip [M25.552]      Rib pain [R07.81]      Back pain, unspecified back location, unspecified back pain laterality, unspecified chronicity [M54.9]                   Referral:   Hip and back pain increase strength and ROM       Physical Therapy Visit    Time in: 1417  Time out: 1506  Visit #: 5 PROGRESS UPDATE BY 10th VISIT OR 01/10/23   POC Due: 01/10/23    Did PT communicate with PTA:  Yes  Precautions/Restrictions: Chronic low back pain; reports not much L hip pain     1.) PMH: asthma, COPD, Diabetes, HTN, hypothyroid, pelvic laparoscopy, leg surgery (2-3 surgeries), neck surgery (removal of a lump), ulnar nerve repair   2.) reports with COPD has difficulty breathing, had skin cancer (removed)     Soft tissue mobilization: Yes, as tolerated   Electrical stimulation: Yes, Neubie low back protocol, TENS   Ultra sound: Yes, 20%   Exercise precautions:  as tolerated; keep in mind PMH   Treatment Note      Subjective:   Pt reports her back pain is "30" out of 10 and later state 7/10. Pt states her L hip pain is fine. Helps feels better in back after 2-3 days after last session.  Pt reports she feels much better at the conclusion of session, does not give numeric pain rating.       Objective: All treatment performed one-on-one except as noted with "unattended" description. Continued skilled care in alignment with goals. Pt participates in session to address low back pain.       Therapeutic Exercise: 39 minutes (+2 minutes unbillable set up time) (2 units)   Neuromuscular Reeducation:   Manual Therapy:  minutes  Ultrasound: 8 minutes (1 unit)   Total tx time: 49 minutes (47 billable minutes) (3 units)            Therapeutic Exercise: progressive/ graded exercises supervised by a skilled physical  therapist or physical therapy assistant to improve pt's functional strength, endurance, and mobility of the  trunk and LE  to improve pt's ability to perform functional and recreational activities with less difficulty     Sidelying hip IR AROM "reverse clamshells" 2x10 each LE  Sidelying hip ER AROM "clamshells" 2x10 each LE   Bridges 2x10   Supine posterior pelvic tilt 3x10   Lower trunk rotation (reciprocal hip IR and ER AROM) x 2 minutes (added)   Supine marches 2x10 each LE (added)   Seated lumbar flexion stretch 2x30 sec (added)   Seated figure 4 piriformis stretch x 60 sec each LE (added)     Subjective and objective exam  Pt education on purpose of movement interventions     Not performed today:  Seated Hamstring Stretch: x1 minute each LE (added today)    Neuromuscular Re-education: progressive supervised exercises by a skilled physical therapist or physical therapy assistant to improve motor control / correct movement compensations and/or to improve proprioception  of the trunk and LE to improve pt's ability to perform ADLs and IADLs safely, with more independence, and with less difficulty.     *  none performed today           Manual Therapy: to improve tissue extensibility and/or joint mobility to ultimately reduce symptoms, promote function, and/or improve tolerance to movement interventions in POC     *none performed today     Modalities: performed by a skilled physical therapist or physical therapy assistant to modulate pain and/or improve tissue extensibility and/or reduce tone and/or to reduce swelling to ultimately improve pt's ability to perform ADLs and IADLs with less symptoms and difficulty and to also improve pt's tolerance to progressive movement interventions     Ultrasound Time Duty Cycle Frequency Intensity Position Location(s)   8 minutes 20% to decrease pain and inflammation 1.0 MHz 1.0 W/cm2 L sidelying  R lumbar paraspinals            Assessment:     Today's session focused on   progressive therapeutic exercises to improve hip strength and mobility and ultrasound to help reduce any inflammation contributing to symptoms to help pt achieve prior level of function through trying to achieve goals established. Progressed pt today by weaning from [place] low back protocol and performing therapeutic exercise. Pt able to complete all exercises without significant increases in pain or difficulty. Pt does require verbal cuing to avoid swaying of LE during bridges- pt mentions of LE length discrepancy that could be contributing to this.Pt reports pain in the L lateral LE during lower trunk rotaiton with hip IR movement of the L LE, modified by increase hip abduction, once corrected pt reports improvement in symptom. Pt reports aching in the L hip during reverse clamshells, able to perform correctly. No reports of pain during ultrasound. Increased time with pt education in between exercises to answer pt's questions on purposes of exercises. Note reports of improved symptoms by conclusion of session.     Plan:   Try soft tissue mobilization at next session   Check myotomal strength at next session with primary PT   Treatment plan will involve the following interventions:  (Neuromuscular re-education, therapeutic exercise, manual therapy, modalities, pt education)     Neuromuscular re-education to improve motor control and stability.  Therapeutic exercise to improve functional strength, endurance and mobility.   Manual therapy to improve tissue extensibility and joint mobility.   Modalities if appropriate for pain modulation and to improve tolerance to activity and movement interventions.   Pt education on subjects like a home exercise program for independent management, plan of care, prognosis, and treatment plan.   Treatment plan will also involve any deficits identified in regional interdependent regions possibly contributing to symptoms like the thoracic spine and hips to ultimately help reduce  symptoms and improve the pt's ability to perform functional and recreational activities without difficulty.     Goals:    Short Term Goals  In 3-4 weeks:  1) Pt will be independent in a HEP for long term management of symptoms and function.   2) Pt will score 28% or less on the Oswestry Low Back Pain Disability Questionnaire.         Long Term Goals  In 6-8 weeks:  1) Pt will score 16% or less on the Oswestry Low Back Pain Disability Questionnaire.   2) Pt will demonstrate at least 32 degrees hip IR AROM bilaterally to improve the pt's ability to perform household related activities with less difficulty.   3)  Pt will demonstrate nonpainful multisegmental flexion per Selective Functional Movement Assessment to improve the pt's ability to perform self care  and household related activities with less difficulty.   4) Pt will demonstrate nonpainful multisegmental rotation bilaterally per Selective Functional Movement Assessment to improve the pt's ability to ambulate with less difficulty.   5) Pt will give subjective reports of at least 75% improvement in symptoms.           Home Exercise Program History (HEP)   Access Code: [email or web address]  URL: [email or web address]  Date: 12/13/2022  Prepared by: Enos Fling    Program Notes  NO SHARP PAIN WITH EXERCISE     Exercises  - Supine Quad Set  - 2 x daily - 7 x weekly - 2 sets - 10 reps - 3 sec hold  - Supine Single Knee to Chest Stretch  - 2 x daily - 7 x weekly - 2 sets - 10 reps - 5 sec hold  - Bent Knee Fallouts  - 2 x daily - 7 x weekly - 2 sets - 10 reps - 3 sec hold  - Seated Long Arc Quad  - 2 x daily - 7 x weekly - 2 sets - 20 reps - 3 sec hold  - Supine Lower Trunk Rotation  - 1-2 x daily - 7 x weekly - 1-2 minutes  perform for   - Supine Bridge  - 1-2 x daily - 7 x weekly - 2 sets - 10 reps  - Seated Figure 4 Piriformis Stretch  - 1-2 x daily - 7 x weekly - 1 sets - 1 reps - 60 sec hold  - Seated Flexion Stretch  - 1-2 x daily - 7 x weekly - 2 reps - 30 sec  hold  - Supine March  - 1-2 x daily - 7 x weekly - 2 sets - 10 reps  - Supine Posterior Pelvic Tilt  - 1-2 x daily - 7 x weekly - 2 sets - 10 reps    Exercises/Modalities Not Performed This Treatment:     Neubi Mapping/Training Protocol for low back  (Purpose: decrease painful inputs to central nervous system while improving kinesthetic awareness of the core to restore normal movement patterns such as bending, turning, rolling over)  36 minutes (Neuromuscular Reeducation)(all time is one-on-one including set up, education and intensity adjustments by skilled neubie certified clinician)  500hz   Intensity: 16% progressed to 26 % decreased to 24%.   Channel 1:2x4-sized pad with Red/positive polarity placed L Lumbar Paraspinals.                   2x4-sized pad with Black/negative polarity placed L SI.  Channel 2:2x4-sized pad with Red/positive polarity placed R Lumbar Paraspinal.                   2x4-sized pad with Black/negative polarity placed R SI.  One-on-one treatment for set-up and education with the goal of reaching 5/10 pain/intensity or greater if tolerated for Central nervous system retraining goals while being guided through the following exercises:    SKTC 10x each  PPT 2x10  Bridges 10x  Clams 2x10 each LE  Reverse clams 2x10 each LE  LAQ 20x each LE  Hip hinge 2x10       Enos Fling, DPT [phone]

## 2022-12-13 NOTE — Telephone Encounter (Signed)
Pt is wanting to know if you could order blood work for her?  She wants everything checked.       Angela Frey, [address] [place]

## 2022-12-14 ENCOUNTER — Other Ambulatory Visit (INDEPENDENT_AMBULATORY_CARE_PROVIDER_SITE_OTHER): Payer: Self-pay | Admitting: FAMILY MEDICINE

## 2022-12-14 DIAGNOSIS — E782 Mixed hyperlipidemia: Secondary | ICD-10-CM

## 2022-12-14 DIAGNOSIS — E1165 Type 2 diabetes mellitus with hyperglycemia: Secondary | ICD-10-CM

## 2022-12-14 DIAGNOSIS — I10 Essential (primary) hypertension: Secondary | ICD-10-CM

## 2022-12-14 DIAGNOSIS — E038 Other specified hypothyroidism: Secondary | ICD-10-CM

## 2022-12-15 ENCOUNTER — Ambulatory Visit (HOSPITAL_BASED_OUTPATIENT_CLINIC_OR_DEPARTMENT_OTHER): Payer: Self-pay

## 2022-12-20 ENCOUNTER — Ambulatory Visit (HOSPITAL_BASED_OUTPATIENT_CLINIC_OR_DEPARTMENT_OTHER): Payer: Self-pay

## 2022-12-22 ENCOUNTER — Telehealth (INDEPENDENT_AMBULATORY_CARE_PROVIDER_SITE_OTHER): Payer: Self-pay | Admitting: FAMILY MEDICINE

## 2022-12-22 NOTE — Telephone Encounter (Signed)
Patient needs to see the Podiatrist for the comprehensive diabetic foot exam. The podiatrist fills out the form and I sign it she just left a blank form.

## 2022-12-23 ENCOUNTER — Other Ambulatory Visit: Payer: Self-pay

## 2022-12-23 ENCOUNTER — Ambulatory Visit (HOSPITAL_BASED_OUTPATIENT_CLINIC_OR_DEPARTMENT_OTHER)
Admission: RE | Admit: 2022-12-23 | Discharge: 2022-12-23 | Disposition: A | Discharge status: Still In Hospital | Payer: Medicare PPO | Source: Ambulatory Visit | Attending: FAMILY MEDICINE | Admitting: FAMILY MEDICINE

## 2022-12-23 DIAGNOSIS — M549 Dorsalgia, unspecified: Secondary | ICD-10-CM | POA: Insufficient documentation

## 2022-12-23 DIAGNOSIS — R0781 Pleurodynia: Secondary | ICD-10-CM | POA: Insufficient documentation

## 2022-12-23 DIAGNOSIS — M545 Low back pain, unspecified: Secondary | ICD-10-CM | POA: Insufficient documentation

## 2022-12-23 DIAGNOSIS — M25552 Pain in left hip: Secondary | ICD-10-CM | POA: Insufficient documentation

## 2022-12-27 ENCOUNTER — Ambulatory Visit (HOSPITAL_BASED_OUTPATIENT_CLINIC_OR_DEPARTMENT_OTHER)
Admission: RE | Admit: 2022-12-27 | Discharge: 2022-12-27 | Disposition: A | Discharge status: Still In Hospital | Payer: Medicare PPO | Source: Ambulatory Visit | Attending: FAMILY MEDICINE | Admitting: FAMILY MEDICINE

## 2022-12-27 ENCOUNTER — Telehealth (INDEPENDENT_AMBULATORY_CARE_PROVIDER_SITE_OTHER): Payer: Self-pay | Admitting: FAMILY MEDICINE

## 2022-12-27 ENCOUNTER — Other Ambulatory Visit: Payer: Self-pay

## 2022-12-27 NOTE — PT Treatment (Signed)
[place] [facility]- [facility]  PT Back Treatment      Patient Name: Angela Frey  Patient DOB: Oct 01, 1960    Referring physician: Bing Plume, DO    Return to Doctor:     Diagnosis:   Pain of left hip [M25.552]      Rib pain [R07.81]      Back pain, unspecified back location, unspecified back pain laterality, unspecified chronicity [M54.9]                   Referral:   Hip and back pain increase strength and ROM       Physical Therapy Visit    Time in: 1406  Time out: 1443  Visit #: 6 PROGRESS UPDATE BY 10th VISIT OR 01/10/23   POC Due: 01/10/23    Did PT communicate with PTA:  Yes  Precautions/Restrictions: Chronic low back pain; reports not much L hip pain     1.) PMH: asthma, COPD, Diabetes, HTN, hypothyroid, pelvic laparoscopy, leg surgery (2-3 surgeries), neck surgery (removal of a lump), ulnar nerve repair   2.) reports with COPD has difficulty breathing, had skin cancer (removed)     Soft tissue mobilization: Yes, as tolerated   Electrical stimulation: Yes, Neubie low back protocol, TENS   Ultra sound: Yes, 20%   Exercise precautions:  as tolerated; keep in mind PMH   Treatment Note      Subjective:     Pt rates her pain at 5-6/10 currently. Pt reports was in a lot of pain recently and had to cancel last appointment. Pt reports got really sore and hurting on the R leg after last session. Not sure how long after last session when it started .Pt reports that therapy isn't making her feel worse. Pt reports her pain is on the L side of the low back. Pt reports that when it started hurting, put her in bed for a couple of days. Pt reports rested for a couple of days.  Pt denies any medical changes since previous session. Pt reports something is going on her back, states someone needs to find out what is wrong with her back. Pt reports her pain makes bend over and can't stand up straight. Pt when t hurts really bad she will need to sit down real quick and sit down for a few minutes  and can get back up and go a little bit more, but then will have to do it again. Pt rates her pain at 3/10 at conclusion of session.     Objective: All treatment performed one-on-one except as noted with "unattended" description. Continued skilled care in alignment with goals. Pt participates in session to address low back pain.               Therapeutic Exercise: 16 minutes  (+4 minutes unbillable set up time)  (1 unit)   Neuromuscular Reeducation:   Manual Therapy: 8  minutes (0 unit)   Electrical stimulation (unattended): 10 minutes (1 unit)   Ultrasound:    Total tx time: 37 minutes (34 billable minutes) (2 units)            Therapeutic Exercise: progressive/ graded exercises supervised by a skilled physical therapist or physical therapy assistant to improve pt's functional strength, endurance, and mobility of the  trunk and LE  to improve pt's ability to perform functional and recreational activities with less difficulty     Seated posterior pelvic tilts 2x10 (added)  Seated posterior pelvic tilt with hip adduction with kids ball x 10 (added)   Subjective exam  Pt education on POC and possible factors contributing to symptoms and educated to notify therapy team if anything in therapy increased her symptoms     Not performed today:  Seated Hamstring Stretch: x1 minute each LE (added today)  Sidelying hip IR AROM "reverse clamshells" 2x10 each LE  Sidelying hip ER AROM "clamshells" 2x10 each LE   Bridges 2x10   Supine posterior pelvic tilt 3x10   Lower trunk rotation (reciprocal hip IR and ER AROM) x 2 minutes (added)   Supine marches 2x10 each LE (added)   Seated lumbar flexion stretch 2x30 sec (added)   Seated figure 4 piriformis stretch x 60 sec each LE (added)     Neuromuscular Re-education: progressive supervised exercises by a skilled physical therapist or physical therapy assistant to improve motor control / correct movement compensations and/or to improve proprioception  of the trunk and LE to improve  pt's ability to perform ADLs and IADLs safely, with more independence, and with less difficulty.     *none performed today           Manual Therapy: to improve tissue extensibility and/or joint mobility to ultimately reduce symptoms, promote function, and/or improve tolerance to movement interventions in POC     Soft tissue mobilization- L lumbar paraspinals, L proximal gluteals   Mode: R sidelying, pillow between knees, moderate pressure, within pt's tolerance   Purpose: to improve tissue extensibility, segmental spine mobility, and to reduce symptoms     Modalities: performed by a skilled physical therapist or physical therapy assistant to modulate pain and/or improve tissue extensibility and/or reduce tone and/or to reduce swelling to ultimately improve pt's ability to perform ADLs and IADLs with less symptoms and difficulty and to also improve pt's tolerance to progressive movement interventions     Electrical Stimulation Type Time Location Parameters With Other Modality Patient Position   Pre modulation to decrease pain. 10 minutes L lumbar paraspinals Intensity: 13  Presets  Moist heat seated        Assessment:     Today's session focused on  electrical stimulation to modulate pain to improve tolerance to movement interventions, therapeutic exercises  to improve lumbar stability, and soft tissue mobilization to improve tissue extensibility and reduce symptoms to help pt achieve prior level of function through trying to achieve goals established. Pt provided extensive pt education on POC and factors possibly contributing to symptoms based on pt asking a variety of questions about her symptoms. Pt also provides nonspecific subjective information pertaining to case factored into nonbillable tx time. No pain reported during electrical stimulation. Pt also requires increased time for set up/transfers. Pt requires consistent tacitle cuing to perform seated posterior pelvic tilts correctly, able to perform correctly  after cuing. Moderate soft tissue restrictions noted throughout the L lumbar paraspinals with higher restrictions noted in the lower lumbar paraspinals and proximal gluteals, no reports of pain during with reports of relief during. Overall, pt reports reduction in symptoms by conclusion of session. Treatment session shortened due to late arrival and increased nonrelated subjective report provided.       Plan:   Try soft tissue mobilization at next session   Check myotomal strength at next session with primary PT   Treatment plan will involve the following interventions:  (Neuromuscular re-education, therapeutic exercise, manual therapy, modalities, pt education)     Neuromuscular re-education to improve motor control and stability.  Therapeutic exercise to improve functional strength, endurance and mobility.   Manual therapy to improve tissue extensibility and joint mobility.   Modalities if appropriate for pain modulation and to improve tolerance to activity and movement interventions.   Pt education on subjects like a home exercise program for independent management, plan of care, prognosis, and treatment plan.   Treatment plan will also involve any deficits identified in regional interdependent regions possibly contributing to symptoms like the thoracic spine and hips to ultimately help reduce symptoms and improve the pt's ability to perform functional and recreational activities without difficulty.     Goals:    Short Term Goals  In 3-4 weeks:  1) Pt will be independent in a HEP for long term management of symptoms and function.   2) Pt will score 28% or less on the Oswestry Low Back Pain Disability Questionnaire.         Long Term Goals  In 6-8 weeks:  1) Pt will score 16% or less on the Oswestry Low Back Pain Disability Questionnaire.   2) Pt will demonstrate at least 32 degrees hip IR AROM bilaterally to improve the pt's ability to perform household related activities with less difficulty.   3)  Pt will  demonstrate nonpainful multisegmental flexion per Selective Functional Movement Assessment to improve the pt's ability to perform self care and household related activities with less difficulty.   4) Pt will demonstrate nonpainful multisegmental rotation bilaterally per Selective Functional Movement Assessment to improve the pt's ability to ambulate with less difficulty.   5) Pt will give subjective reports of at least 75% improvement in symptoms.           Home Exercise Program History (HEP)   Access Code: [email or web address]  URL: [email or web address]  Date: 12/13/2022  Prepared by: Enos Fling    Program Notes  NO SHARP PAIN WITH EXERCISE     Exercises  - Supine Quad Set  - 2 x daily - 7 x weekly - 2 sets - 10 reps - 3 sec hold  - Supine Single Knee to Chest Stretch  - 2 x daily - 7 x weekly - 2 sets - 10 reps - 5 sec hold  - Bent Knee Fallouts  - 2 x daily - 7 x weekly - 2 sets - 10 reps - 3 sec hold  - Seated Long Arc Quad  - 2 x daily - 7 x weekly - 2 sets - 20 reps - 3 sec hold  - Supine Lower Trunk Rotation  - 1-2 x daily - 7 x weekly - 1-2 minutes  perform for   - Supine Bridge  - 1-2 x daily - 7 x weekly - 2 sets - 10 reps  - Seated Figure 4 Piriformis Stretch  - 1-2 x daily - 7 x weekly - 1 sets - 1 reps - 60 sec hold  - Seated Flexion Stretch  - 1-2 x daily - 7 x weekly - 2 reps - 30 sec hold  - Supine March  - 1-2 x daily - 7 x weekly - 2 sets - 10 reps  - Supine Posterior Pelvic Tilt  - 1-2 x daily - 7 x weekly - 2 sets - 10 reps    Exercises/Modalities Not Performed This Treatment:     Neubi Mapping/Training Protocol for low back  (Purpose: decrease painful inputs to central nervous system while improving kinesthetic awareness of the core to restore normal movement patterns such as bending, turning, rolling over)  36 minutes (Neuromuscular Reeducation)(all time is one-on-one including set up, education and intensity adjustments by skilled neubie certified clinician)  500hz   Intensity: 16% progressed to  26 % decreased to 24%.   Channel 1:2x4-sized pad with Red/positive polarity placed L Lumbar Paraspinals.                   2x4-sized pad with Black/negative polarity placed L SI.  Channel 2:2x4-sized pad with Red/positive polarity placed R Lumbar Paraspinal.                   2x4-sized pad with Black/negative polarity placed R SI.  One-on-one treatment for set-up and education with the goal of reaching 5/10 pain/intensity or greater if tolerated for Central nervous system retraining goals while being guided through the following exercises:    SKTC 10x each  PPT 2x10  Bridges 10x  Clams 2x10 each LE  Reverse clams 2x10 each LE  LAQ 20x each LE  Hip hinge 2x10           Enos Fling, DPT [phone]

## 2022-12-27 NOTE — Telephone Encounter (Signed)
Angela Frey called in and asked if you could order her an xray for her left hip?    She also asked about the diabetic shoes form.  She stated that a podiatrist does not have to fill out the form        Angela Frey, [employer]

## 2022-12-30 ENCOUNTER — Other Ambulatory Visit: Payer: Self-pay

## 2022-12-30 ENCOUNTER — Ambulatory Visit
Admission: RE | Admit: 2022-12-30 | Discharge: 2022-12-30 | Disposition: A | Discharge status: Still In Hospital | Payer: Medicare PPO | Source: Ambulatory Visit | Attending: FAMILY MEDICINE | Admitting: FAMILY MEDICINE

## 2022-12-30 NOTE — PT Treatment (Signed)
[place] [facility]- [facility]  PT Back Treatment      Patient Name: Angela Frey  Patient DOB: 01-04-1961    Referring physician: Bing Plume, DO               SEE PLAN SECTION     Return to Doctor:     Diagnosis:   Pain of left hip [M25.552]      Rib pain [R07.81]      Back pain, unspecified back location, unspecified back pain laterality, unspecified chronicity [M54.9]                   Referral:   Hip and back pain increase strength and ROM       Physical Therapy Visit    Time in: 1512  Time out: 1545  Visit #: 7 PROGRESS UPDATE BY 10th VISIT OR 01/10/23   POC Due: 01/10/23    Did PT communicate with PTA:  Yes  Precautions/Restrictions: Chronic low back pain; reports not much L hip pain     1.) PMH: asthma, COPD, Diabetes, HTN, hypothyroid, pelvic laparoscopy, leg surgery (2-3 surgeries), neck surgery (removal of a lump), ulnar nerve repair   2.) reports with COPD has difficulty breathing, had skin cancer (removed)     Soft tissue mobilization: Yes, as tolerated   Electrical stimulation: Yes, Neubie low back protocol, TENS   Ultra sound: Yes, 20%   Exercise precautions:  as tolerated; keep in mind PMH   Treatment Note      Subjective: Pt reports that she has pain in her leg and back and rates it at 5/10. Pt reports that she did fine after last session and found it helpful. Pt reports both legs are hurting and L knee is killing her. Pt reports that the back of her leg is really puffy and states it is sticking out, noticed it for about 3 days. Pt states wondering if it is a Baker's cyst because her mother has had one.  Pt reports she always has shortness of breath and states COPD and emphysema and has not changed recently. Pt reports able to put weight on leg that has the swelling, not sure if it is red, can't look at it. Pt reports that therapy helps at the time, but after she starts back to her "normalcy" her pain returns. Pt reports massage helped quite a bit last time. Pt  reports that she got a scratch on her head (mentions about running into something) states no big deal, put stuff on her nose (has a scratch on her nose), Tess who is her sister and a nurse checked her head out after getting this scratch on her head. Pt states that when she was doing her HEP exercises they were helping a little bit with her pain, but has not been able to due to being very busy. Pt reports based on her schedule, would like to hold therapy and talk to Dr. Wonda Olds as well.  Pt reports massage was helpful today at conclusion of session.         Objective: All treatment performed one-on-one except as noted with "unattended" description. Continued skilled care in alignment with goals. Pt participates in session to address low back pain.           Selective Functional Movement Assessment (SFMA)  Key: FN= functional, nonpainful, DN= dysfunctional, nonpainful, FP= Functional, Painful, DP=Dysfunctional Painful)      Multi-segmental Flexion: DN   Cannot touch  toes  Sacral angle < 70 degrees   Non-uniform spine curve  Lack of posterior weight shift  Excessive effort and/or appreciable asymmetry or lack of motor control       Multi-segmental R rotation: DN   Pelvis rotation < 50 degrees   Torso rotation < 50 degrees   Excessive effort and/or lack of symmetry or motor control    Multi-segmental L rotation: DP   Pelvis rotation < 50 degrees   Torso rotation < 50 degrees   Excessive effort and/or lack of symmetry or motor control    LE Myotomal testing:   L2 Psoas:                    R:  5/5             L: 4/5  L3 Quadriceps:            R:  4+/5              L: 4+/5   L3 Adductors:              R:  5/5              L: 4+/5   L5 Gluteus Medius:     R:       5/5           L:  4/5  S1 Hamstrings:           R:     5/5             L: 5/5    Hip IR AROM: R:   26     L: 20     L Homan's sign: (-)     Oswestry Low Back Pain Disability Questionnaire: 37.77% (17/45)          Therapeutic Exercise:  Neuromuscular Reeducation:    Manual Therapy: 29 minutes (+4 minutes unbillable time) (2 units)   Electrical stimulation (unattended):  Ultrasound:    Total tx time: 33 minutes (29 billable minutes) (2 units)            Therapeutic Exercise: progressive/ graded exercises supervised by a skilled physical therapist or physical therapy assistant to improve pt's functional strength, endurance, and mobility of the  trunk and LE  to improve pt's ability to perform functional and recreational activities with less difficulty     *none performed today     Not performed today:    Seated posterior pelvic tilts 2x10 (added)   Seated posterior pelvic tilt with hip adduction with kids ball x 10 (added)   Seated Hamstring Stretch: x1 minute each LE (added today)  Sidelying hip IR AROM "reverse clamshells" 2x10 each LE  Sidelying hip ER AROM "clamshells" 2x10 each LE   Bridges 2x10   Supine posterior pelvic tilt 3x10   Lower trunk rotation (reciprocal hip IR and ER AROM) x 2 minutes (added)   Supine marches 2x10 each LE (added)   Seated lumbar flexion stretch 2x30 sec (added)   Seated figure 4 piriformis stretch x 60 sec each LE (added)     Neuromuscular Re-education: progressive supervised exercises by a skilled physical therapist or physical therapy assistant to improve motor control / correct movement compensations and/or to improve proprioception  of the trunk and LE to improve pt's ability to perform ADLs and IADLs safely, with more independence, and with less difficulty.     *none performed today  Manual Therapy: to improve tissue extensibility and/or joint mobility to ultimately reduce symptoms, promote function, and/or improve tolerance to movement interventions in POC     Soft tissue mobilization- L lumbar paraspinals, L quadratus lumborum    Mode: R sidelying, pillow between knees, moderate pressure, within pt's tolerance   Purpose: to improve tissue extensibility, segmental spine mobility, and to reduce symptoms     Modalities: performed  by a skilled physical therapist or physical therapy assistant to modulate pain and/or improve tissue extensibility and/or reduce tone and/or to reduce swelling to ultimately improve pt's ability to perform ADLs and IADLs with less symptoms and difficulty and to also improve pt's tolerance to progressive movement interventions     *none performed today     Assessment:     Since the initial evaluation, pt has made a small improvement in Oswestry score,ability to perform multisegmental flexion and R multisegmental rotation per [facility] without reports of increased pain and pt reports some improvement in symptoms temporarily after sessions (see objective and subjective). Pt has not demonstrated an improvement in bilateral hip IR AROM and demonstrated a regression in multisegmental L rotation per SFMA due to reports of increased pain now with the movement (see objective). Based on pt's subjective reports of a busy schedule and noted small improvement, pt and therapist agreed to hold case due to pt's busy schedule, but to also consult back with referring physician to determine next course in care. Pt's treatment session shortened due to late arrival. Did verbally review exercises in HEP, pt states does not need to go over. Performed soft tissue mobilization based on reports of past benefit. Soft tissue restrictions noted along the lower L lumbar paraspinals with reports of tenderness that pt tolerates well. Pt reports improvement in symptoms by conclusion of session. Pt mentions bulge behind L knee, do not notice excessive bulge, skin redness, or global swelling, negative Homan's sign, pt denies any recent changes in breathing, denies pain, and reports able o bear weight, pt educated if unable to bear weight, pain occurs in area, redness occurs in area, and/or changes in breathing to seek medical attention. No significant changes in pt's behavior or gait pattern compared to previous sessions, no slurring of speech, no facial  changes compared to previous session except scratch on head with no discharge noted coming from area. No other skin marks observed other than this scratch and pt appears pleasant with no behavior changes compared to baseline.     Physical Therapy Progress Note      Plan:     Based on pt's subjective reports of a busy schedule and noted small improvement, pt and therapist agreed to hold case due to pt's busy schedule, but to also consult back with referring physician to determine next course in care. Provided print out of HEP for management.     Goals:    Short Term Goals  In 3-4 weeks:  1) Pt will be independent in a HEP for long term management of symptoms and function.   MET AS OF 12/30/22   2) Pt will score 28% or less on the Oswestry Low Back Pain Disability Questionnaire.   IN PROGRESS AS OF 12/30/22 (37.77%)         Long Term Goals  In 6-8 weeks:  1) Pt will score 16% or less on the Oswestry Low Back Pain Disability Questionnaire.   IN PROGRESS AS OF 12/30/22 (37.77%)   2) Pt will demonstrate at least 32 degrees hip IR AROM  bilaterally to improve the pt's ability to perform household related activities with less difficulty.   IN PROGRESS AS OF 12/30/22 ( R:   26     L: 20 )   3)  Pt will demonstrate nonpainful multisegmental flexion per Selective Functional Movement Assessment to improve the pt's ability to perform self care and household related activities with less difficulty.   MET AS OF 12/30/22   4) Pt will demonstrate nonpainful multisegmental rotation bilaterally per Selective Functional Movement Assessment to improve the pt's ability to ambulate with less difficulty.   IN PROGRESS AS OF 12/30/22 (R met, L painful)   5) Pt will give subjective reports of at least 75% improvement in symptoms.           Home Exercise Program History (HEP)       Exercises/Modalities Not Performed This Treatment:     Neubi Mapping/Training Protocol for low back  (Purpose: decrease painful inputs to central nervous system while  improving kinesthetic awareness of the core to restore normal movement patterns such as bending, turning, rolling over)  36 minutes (Neuromuscular Reeducation)(all time is one-on-one including set up, education and intensity adjustments by skilled neubie certified clinician)  500hz   Intensity: 16% progressed to 26 % decreased to 24%.   Channel 1:2x4-sized pad with Red/positive polarity placed L Lumbar Paraspinals.                   2x4-sized pad with Black/negative polarity placed L SI.  Channel 2:2x4-sized pad with Red/positive polarity placed R Lumbar Paraspinal.                   2x4-sized pad with Black/negative polarity placed R SI.  One-on-one treatment for set-up and education with the goal of reaching 5/10 pain/intensity or greater if tolerated for Central nervous system retraining goals while being guided through the following exercises:    SKTC 10x each  PPT 2x10  Bridges 10x  Clams 2x10 each LE  Reverse clams 2x10 each LE  LAQ 20x each LE  Hip hinge 2x10                 Enos Fling, DPT [phone]

## 2023-01-05 ENCOUNTER — Ambulatory Visit: Payer: Medicare PPO | Attending: FAMILY MEDICINE | Admitting: FAMILY MEDICINE

## 2023-01-05 ENCOUNTER — Encounter (INDEPENDENT_AMBULATORY_CARE_PROVIDER_SITE_OTHER): Payer: Self-pay | Admitting: FAMILY MEDICINE

## 2023-01-05 ENCOUNTER — Other Ambulatory Visit: Payer: Self-pay

## 2023-01-05 VITALS — BP 132/62 | HR 84 | Temp 96.9°F | Ht 64.0 in | Wt 213.0 lb

## 2023-01-05 DIAGNOSIS — E039 Hypothyroidism, unspecified: Secondary | ICD-10-CM | POA: Insufficient documentation

## 2023-01-05 DIAGNOSIS — E782 Mixed hyperlipidemia: Secondary | ICD-10-CM | POA: Insufficient documentation

## 2023-01-05 DIAGNOSIS — E669 Obesity, unspecified: Secondary | ICD-10-CM | POA: Insufficient documentation

## 2023-01-05 DIAGNOSIS — Z87891 Personal history of nicotine dependence: Secondary | ICD-10-CM | POA: Insufficient documentation

## 2023-01-05 DIAGNOSIS — I700 Atherosclerosis of aorta: Secondary | ICD-10-CM | POA: Insufficient documentation

## 2023-01-05 DIAGNOSIS — M25562 Pain in left knee: Secondary | ICD-10-CM | POA: Insufficient documentation

## 2023-01-05 DIAGNOSIS — J449 Chronic obstructive pulmonary disease, unspecified: Secondary | ICD-10-CM | POA: Insufficient documentation

## 2023-01-05 DIAGNOSIS — E119 Type 2 diabetes mellitus without complications: Secondary | ICD-10-CM | POA: Insufficient documentation

## 2023-01-05 DIAGNOSIS — E1165 Type 2 diabetes mellitus with hyperglycemia: Secondary | ICD-10-CM | POA: Insufficient documentation

## 2023-01-05 DIAGNOSIS — E1140 Type 2 diabetes mellitus with diabetic neuropathy, unspecified: Secondary | ICD-10-CM | POA: Insufficient documentation

## 2023-01-05 DIAGNOSIS — K219 Gastro-esophageal reflux disease without esophagitis: Secondary | ICD-10-CM | POA: Insufficient documentation

## 2023-01-05 DIAGNOSIS — Z6836 Body mass index (BMI) 36.0-36.9, adult: Secondary | ICD-10-CM | POA: Insufficient documentation

## 2023-01-05 DIAGNOSIS — M25552 Pain in left hip: Secondary | ICD-10-CM | POA: Insufficient documentation

## 2023-01-05 DIAGNOSIS — G629 Polyneuropathy, unspecified: Secondary | ICD-10-CM | POA: Insufficient documentation

## 2023-01-05 DIAGNOSIS — G8929 Other chronic pain: Secondary | ICD-10-CM | POA: Insufficient documentation

## 2023-01-05 DIAGNOSIS — L219 Seborrheic dermatitis, unspecified: Secondary | ICD-10-CM | POA: Insufficient documentation

## 2023-01-05 DIAGNOSIS — Z7984 Long term (current) use of oral hypoglycemic drugs: Secondary | ICD-10-CM | POA: Insufficient documentation

## 2023-01-05 DIAGNOSIS — M549 Dorsalgia, unspecified: Secondary | ICD-10-CM | POA: Insufficient documentation

## 2023-01-05 DIAGNOSIS — S0083XA Contusion of other part of head, initial encounter: Secondary | ICD-10-CM | POA: Insufficient documentation

## 2023-01-05 DIAGNOSIS — M545 Low back pain, unspecified: Secondary | ICD-10-CM | POA: Insufficient documentation

## 2023-01-05 DIAGNOSIS — I10 Essential (primary) hypertension: Secondary | ICD-10-CM | POA: Insufficient documentation

## 2023-01-05 MED ORDER — KETOCONAZOLE 2 % SHAMPOO
MEDICATED_SHAMPOO | CUTANEOUS | 0 refills | Status: DC
Start: 2023-01-05 — End: 2023-06-01

## 2023-01-05 MED ORDER — GABAPENTIN 600 MG TABLET
600.0000 mg | ORAL_TABLET | Freq: Three times a day (TID) | ORAL | 1 refills | Status: DC
Start: 2023-01-05 — End: 2023-06-01

## 2023-01-05 MED ORDER — MOUNJARO 2.5 MG/0.5 ML SUBCUTANEOUS PEN INJECTOR
2.5000 mg | PEN_INJECTOR | SUBCUTANEOUS | 0 refills | Status: DC
Start: 2023-01-05 — End: 2023-02-01

## 2023-01-05 NOTE — Progress Notes (Signed)
FAMILY MEDICINE, MEDICAL OFFICE BUILDING 5  [address]  [place] [address]      Operated by [facility]     Name: Angela Frey MRN:  [id number]   Date: 01/05/2023 Age: 62 y.o.      Chief Complaint   Patient presents with    Hip Pain     Pt states that she is having left hip pain x months.       SUBJECTIVE    Patient is 62 y.o. she  presents today for complaint of hip pain. She went to PT ans she reports it did not help her hip pain.  She says it did not help at all still hurts and might even her worse.  He has had no injury.  I did an x-ray in February and then she did the physical therapy improvement will try to order MRI of the hip.  I discussed with patient possible referral to pain management if we can not get the MRI who has or the MRI does not give information would suggest that she goes  She is also your for follow-up on diabetes, hypertension, hyperlipidemia and GERD.  She was that she finally got an appointment with ENT that is ridiculous it took so long she should is long I advised her that there is not that many ENT doctors in she has to be on the list like everyone else that has just the way it is.  BS at home she says is fine around 123. She reports she is taking all her medication as directed and has been trying to eat better. She does not like many vegetables and she does not like many food it she is to be sweets.  She wants to stop the metformin I advise her will try to order [facility] I still want her to take metformin even if [facility] root she says she wants to get off of it and I said is still needed and we may be able to decrease it but we can stop.  Patient reports her blood pressure has been doing well at home.    She also reports she saw the lung doctor and when I order her inhalers the 40 dollars but when he orders them the only for dollars explained to her sometimes when a specialist orders something goes to the insurance better than when I order  it least now she can use her inhalers.  She complains that her diabetic neuropathy is getting worse plus she has all this pain discussed increasing her gabapentin today she would like to increase her gabapentin because her neuropathy due to her diabetes is getting worse and she is in pain back in her knee.  She wants to see someone about her knee she has had no knee x-ray he says her knee hurts some pops in is going to give out I do not see ease swelling she complains of pain with movement I will order a knee x-ray then if warranted can make the referral for she says she does not think she needs a referral and she will call the orthopedic doctor and I said she can try that also she says that she is going to call the GI doctor and schedule hormone colonoscopy advise her if they denied then she can let me know and I can refer her.    ROS:      ROS - pertinent for presenting problem.  Systems otherwise negative than what has been noted.  PAST MEDICAL HISTORY  I have reviewed and updated as appropriate the past medical, surgical, family, and social history today:    Medical History/Surgical History/Family History/Social History  Past Medical History:   Diagnosis Date    Asthma     Chronic obstructive airway disease (CMS HCC)     Diabetes (CMS HCC)     Hypertension     Hypothyroid          Past Surgical History:   Procedure Laterality Date    HX PELVIC LAPAROSCOPY      HX TONSILLECTOMY      HX TUBAL LIGATION      HYSTEROSCOPY WITH DILATION AND CURETTAGE N/A 04/09/2021    Performed by Seward Meth, DO at [facility]    LEG SURGERY      2-3 surgeries    NECK SURGERY      TUBOPLASTY / TUBOTUBAL ANASTOMOSIS      ULNAR NERVE REPAIR Bilateral      Family Medical History:    None          Social History     Socioeconomic History    Marital status: Widowed   Tobacco Use    Smoking status: Former     Types: Cigarettes    Smokeless tobacco: Never   Vaping Use    Vaping status: Never Used   Substance and Sexual Activity     Alcohol use: Never    Drug use: Never     Social Determinants of Health     Financial Resource Strain: Low Risk  (03/02/2022)    Financial Resource Strain     SDOH Financial: No   Transportation Needs: Low Risk  (03/02/2022)    Transportation Needs     SDOH Transportation: No   Social Connections: Medium Risk (03/02/2022)    Social Connections     SDOH Social Isolation: 3 to 5 times a week   Intimate Partner Violence: Low Risk  (04/23/2022)    Intimate Partner Violence     SDOH Domestic Violence: No   Housing Stability: Low Risk  (03/02/2022)    Housing Stability     SDOH Housing Situation: I have housing.     SDOH Housing Worry: No       Allergies:  Allergies   Allergen Reactions    Pseudoephedrine Hcl Hives/ Urticaria    Clindamycin Phosphate     Guaifenesin  Other Adverse Reaction (Add comment)    Cleocin [Clindamycin] Hives/ Urticaria    Doxylamin-Pse-Dm-Acetaminophen Hives/ Urticaria and  Other Adverse Reaction (Add comment)    Tessalon [Benzonatate] Hives/ Urticaria       Medications:  Current Outpatient Medications   Medication Sig    albuterol sulfate (PROVENTIL OR VENTOLIN OR PROAIR) 90 mcg/actuation Inhalation oral inhaler Take 1-2 Puffs by inhalation Every 4 hours as needed    amitriptyline (ELAVIL) 25 mg Oral Tablet Take 1 Tablet (25 mg total) by mouth Every night    aspirin 81 mg Oral Tablet, Chewable Chew 1 Tablet (81 mg total) Once a day    baclofen (LIORESAL) 10 mg Oral Tablet Take 1 Tablet (10 mg total) by mouth Four times a day    Blood Sugar Diagnostic (ACCU-CHEK GUIDE TEST STRIPS) Strip 1 Strip by Does not apply route Four times a day    Blood-Glucose Meter Misc 4 times per day    budesonide-formoteroL (SYMBICORT) 160-4.5 mcg/actuation Inhalation oral inhaler Take 2 Puffs by inhalation Twice daily    cyanocobalamin (VITAMIN B 12)  1,000 mcg Oral Tablet Take 1 Tablet (1,000 mcg total) by mouth Once a day    esomeprazole magnesium (NEXIUM) 40 mg Oral Capsule, Delayed Release(E.C.) TAKE 1 CAPSULE BY  MOUTH IN THE MORNING BEFORE BREAKFAST    gabapentin (NEURONTIN) 600 mg Oral Tablet Take 1 Tablet (600 mg total) by mouth Three times a day    hydroCHLOROthiazide (HYDRODIURIL) 25 mg Oral Tablet Take 1 Tablet (25 mg total) by mouth Once a day    ketoconazole (NIZORAL) 2 % Shampoo USE 1/2 TO 1 (ONE-HALF TO ONE) OUNCE OF SHAMPOO TOPICALLY THREE TIMES A WEEK    Lactobac no.41/Bifidobact no.7 (PROBIOTIC-10 ORAL) Take by mouth Once a day    Lancets (ACCU-CHEK SOFTCLIX LANCETS) Misc 1 Each by Does not apply route Four times a day    levothyroxine (SYNTHROID) 175 mcg Oral Tablet Take 1 Tablet (175 mcg total) by mouth Every morning    lisinopriL (PRINIVIL) 20 mg Oral Tablet Take 1 Tablet (20 mg total) by mouth Twice daily    meloxicam (MOBIC) 15 mg Oral Tablet Take 1 Tablet (15 mg total) by mouth Once per day as needed for Pain    metFORMIN (GLUCOPHAGE) 500 mg Oral Tablet Take 1 tablet by mouth twice daily with food    nystatin (MYCOSTATIN) 100,000 unit/gram Cream Apply 0.5 g t.i.d. to affected area.    nystatin (NYSTOP) 100,000 unit/gram Powder by Apply Topically route Three times a day as needed    pediatric multivitamins Oral Tablet, Chewable Chew 1 Tablet Once a day    pravastatin (PRAVACHOL) 10 mg Oral Tablet TAKE 1 TABLET BY MOUTH ONCE DAILY IN THE EVENING    sertraline (ZOLOFT) 100 mg Oral Tablet Take 1 Tablet (100 mg total) by mouth Once a day    tirzepatide (MOUNJARO) 2.5 mg/0.5 mL Subcutaneous Pen Injector Inject 0.5 mL (2.5 mg total) under the skin Every 7 days    tolterodine (DETROL LA) 4 mg Oral Capsule, Sust. Release 24 hr Take 1 Capsule (4 mg total) by mouth Once a day       Immunizations:  Immunization History   Administered Date(s) Administered    Covid-19 Vaccine,Pfizer-BioNTech,Purple Top,38yrs+ 09/22/2019, 10/13/2019    DIPTH,PERTUSSIS-ACEL,TETANUS >10 YRS OLD 06/13/2012    Flu Family 05/28/2013    High-Dose Influenza Vaccine, 65+ 06/01/2011    Influenza Vaccine, 6 month-adult 06/05/2012, 05/21/2014,  05/28/2015, 05/17/2017, 05/14/2019       OBJECTIVE    BP 132/62   Pulse 84   Temp 36.1 C (96.9 F)   Ht 1.626 m (5\' 4" )   Wt 96.6 kg (213 lb)   LMP  (LMP Unknown) Comment: stopped having periods at aget 44-46  SpO2 97%   BMI 36.56 kg/m       Physical Exam:  General: Pleasant, WNWD, NAD, AAO  Head: Normocephalic, AT,no lesions. Bruise on right check.    Eyes: PERL, EOM's full, conjunctivae clear, fundi grossly normal.   Nose:  no rhinnorhea noted, no lesions  Throat: Clear, no exudates, no lesions MMM and pink.  Neck: Supple, no masses, no thyromegaly, no bruits.   Chest: Lungs clear, no rales, no rhonchi, no wheezes.   Heart: RRR, no murmurs, no rubs, no gallops.   Abdomen: Soft, no tenderness, no masses, BS normal.    Extremities: Normal gait, no deformities, no edema.    Neuro: Physiological, no localizing findings.   Skin:  Normal PWD,  Psych: Mood and Affect normal      ASSESSMENT:  ICD-10-CM    1. Pain of left hip  M25.552 MRI HIP LEFT WO CONTRAST     gabapentin (NEURONTIN) 600 mg Oral Tablet      2. Abdominal aortic atherosclerosis (CMS HCC)  I70.0       3. Seborrheic dermatitis of scalp  L21.9 ketoconazole (NIZORAL) 2 % Shampoo      4. Uncontrolled type 2 diabetes mellitus with hyperglycemia (CMS HCC)  E11.65 tirzepatide (MOUNJARO) 2.5 mg/0.5 mL Subcutaneous Pen Injector      5. Neuropathy (CMS HCC)  G62.9 gabapentin (NEURONTIN) 600 mg Oral Tablet      6. Diabetes mellitus (CMS HCC)  E11.9 tirzepatide (MOUNJARO) 2.5 mg/0.5 mL Subcutaneous Pen Injector      7. Chronic pain of left knee  M25.562 XR KNEE LEFT 3 VIEW    G89.29       8. Lumbar pain  M54.50       9. Essential hypertension  I10       10. Mixed hyperlipidemia  E78.2       11. Back pain, unspecified back location, unspecified back pain laterality, unspecified chronicity  M54.9             Body mass index is 36.56 kg/m. BMI addressed: Advised on diet, weight loss, and exercise to reduce above normal BMI.        Laboratory studies/data  reviewed  I have reviewed all available and pertinent laboratory studies, images and health maintenance.    PLAN:    Records reviewed today:  Refill needed meds. ATTACHED  Pt was educated/ counseled on the decisions made today with their involvement in these plans.  Immunizations:  Encourage COVID booster  Preventive counseling:  Continue to monitor blood sugars and blood pressures  Diet and exercise reviewed  See dentist and eye doctor with regular scheduled visits.  Active listening/Asked pertinent questions.    A total of (27) minutes was spent on this patient encounter including review of historical information, examination, documentation and post visit activities.  Follow-up with all specialists  Order left knee xray will call with results  Continue hydrochlorothiazide take as directed hypertension monitor BP at home and in office   Continue levothyroxine take as directed hypothyroidism monitor lab  Continue Zoloft take as directed mood continue to monitor  Continue meloxicam take as directed the right is pool follow-up back scheduled follow-up with your ankle special as will  Continue Nexium take as directed GERD monitor for symptom control  Continue Elavil can directed week goal to monitor for symptom control  Refill and increase gabapentin  to 600 mg take as directed will monitor for symptom control and improvement in neuropathy pain and pain  Order Mounjaro for DMII and weight loss if approved will need to see you one month after starting medication  Refill Nizoral use as directed Seborrheic dermatitis monitor for efficacy   Order MRI left hip failed PT and medication treatment will call with results   Continue all prescribed medications as directed    BMI addressed: Advised on diet, weight loss, and exercise to reduce above normal BMI.    DMII - Follow a diabetic diet such as the ADA diet (American Diabetes Association). Take all medication as directed. Exercise and loose weight because even a 10 lb weight  loss can make a big difference in your blood sugar. Keep an exercise log and record your weight once a week. Keep a blood sugar log and bring this to all appointments.  Recognize signs, symptoms,  and treatment of hypoglycemia (treatment:  Glucose of any form of glucose-containing carbohydrate).  Control blood pressure and in here with antihypertensive medications.  Follow a low-carbohydrate low-fat diet, especially low in saturated fats.  Lower sodium intake.  Get regular aerobic exercise with a goal of at least 150 minutes per week of moderate intensity exercises.  Maintain a healthy body weight.    HTN Hypertension - Follow a low fat , low cholesterol diet. Gradually work up to 30 mins a day of exercise with a goal of exercising 3 to 6 days a week. Take all medication as directed. Follow a heart healthy diet and loose weight. BMI goal of 18.5-24.9 kg/m2. Keep a blood pressure log and bring this log to all appointments. .     Hyperlipidemia - Follow a low fat and low cholesterol diet. Exercise start slowly with the goal of exercising 3 to 5 days a week for at least 30 mins per day. Eating five small meals a day is better than three large meals. Keep an exercise log an bring it to your appointments.     OBESITY - start an exercise program and gradually work up to 30 mins to one hour 5 days a week. IF your can't do that much 10 minutes 3 times a day. Keep a calorie counting log and weigh yourself once a week. Join a waling group it can make exercise more fun. Join a support group. Bring your logs to all visits. Eat a high fiber low fat lower calorie diet.     FOLLOW UP:  Return for as scheduled medicare wellness exam and weight check and BMP check on Mounjaro.    Patient can return sooner if needed.      The patient/care give was given ample opportunity to ask questions and those questions were answered to his/her satisfaction. A good faith effort was made to reconcile the patient's medications. The patient/caregiver  was counseled on any appropriate vaccinations by the provider and questions were answered. The patient/care giver was told to contact me with any additional questions or concerns, or go to the ED in an emergency.       Bing Plume, DO. 01/05/23 12:42  [facility]  [facility]  [address]  [place], [place] [address]  Phone [phone]    This note may have been partially generated using M-Modal Fluency Direct system, and there may be some incorrect words, spellings, and punctuation that were not noted in checking the note before saving.

## 2023-01-05 NOTE — Nursing Note (Signed)
Body mass index is 36.56 kg/m.    Fall Risk Assessment         PHQ Questionnaire                 09/03/2022     2:57 PM   GAD-7 Questionnaire   Feeling nervous,anxious,on edge 0   Not being able to stop or control worrying 2   Worrying too much about different things 2   Trouble relaxing 0   Being so restless that it is hard to sit still 0   Becoming easily annoyed or irritable 0   Feeling afraid as if something awful might happen 0   How difficult have these problems made it for you to work, take care of things at home, or get along with other people? Somewhat difficult   Gad-7 Score Total 4   Interpretation 0-4, normal         Review Flowsheet  More data exists         01/05/2023   FUNCTIONAL HEALTH SCREENING   Because we are aware of abuse and domestic violence today, we ask all patients: Are you being hurt, hit, or frightened by anyone at your home or in your life?  N   Do you have any basic needs within your home that are not being met? (such as Food, Shelter, [employer] fast streamer, Tranportation, paying for bills and/or medications) N      Details                    Travel Screening       Question Response    Have you been in contact with someone who was sick? --    Do you have any of the following new or worsening symptoms? None of these    Have you traveled internationally in the last month? No          Travel History   Travel since 12/05/22    No documented travel since 12/05/22         Sharen Hones, CMA  01/05/2023, 11:04

## 2023-01-08 ENCOUNTER — Other Ambulatory Visit (HOSPITAL_COMMUNITY): Payer: Medicare PPO

## 2023-01-08 ENCOUNTER — Inpatient Hospital Stay
Admission: RE | Admit: 2023-01-08 | Discharge: 2023-01-08 | Disposition: A | Discharge status: Home / Self Care | Payer: Medicare PPO | Source: Ambulatory Visit | Attending: FAMILY MEDICINE | Admitting: FAMILY MEDICINE

## 2023-01-08 ENCOUNTER — Other Ambulatory Visit: Payer: Self-pay

## 2023-01-08 DIAGNOSIS — E038 Other specified hypothyroidism: Secondary | ICD-10-CM | POA: Insufficient documentation

## 2023-01-08 DIAGNOSIS — E1165 Type 2 diabetes mellitus with hyperglycemia: Secondary | ICD-10-CM | POA: Insufficient documentation

## 2023-01-08 DIAGNOSIS — I10 Essential (primary) hypertension: Secondary | ICD-10-CM | POA: Insufficient documentation

## 2023-01-08 DIAGNOSIS — E063 Autoimmune thyroiditis: Secondary | ICD-10-CM | POA: Insufficient documentation

## 2023-01-08 DIAGNOSIS — E782 Mixed hyperlipidemia: Secondary | ICD-10-CM | POA: Insufficient documentation

## 2023-01-08 DIAGNOSIS — M25552 Pain in left hip: Secondary | ICD-10-CM | POA: Insufficient documentation

## 2023-01-08 LAB — LIPID PANEL
CHOL/HDL RATIO: 3.6
CHOLESTEROL: 151 mg/dL (ref 100–200)
HDL CHOL: 42 mg/dL — ABNORMAL LOW (ref >=50)
LDL CALC: 86 mg/dL (ref <100)
NON-HDL: 109 mg/dL (ref <=190)
TRIGLYCERIDES: 126 mg/dL (ref <150)
VLDL CALC: 20 mg/dL (ref <30)

## 2023-01-08 LAB — COMPREHENSIVE METABOLIC PNL, FASTING
ALBUMIN: 3.6 g/dL (ref 3.4–4.8)
ALKALINE PHOSPHATASE: 82 U/L (ref 50–130)
ALT (SGPT): 20 U/L (ref 8–22)
ANION GAP: 11 mmol/L (ref 4–13)
AST (SGOT): 25 U/L (ref 8–45)
BILIRUBIN TOTAL: 0.4 mg/dL (ref 0.3–1.3)
BUN/CREA RATIO: 31 — ABNORMAL HIGH (ref 6–22)
BUN: 30 mg/dL — ABNORMAL HIGH (ref 8–25)
CALCIUM: 10.1 mg/dL (ref 8.6–10.3)
CHLORIDE: 108 mmol/L (ref 96–111)
CO2 TOTAL: 25 mmol/L (ref 23–31)
CREATININE: 0.96 mg/dL (ref 0.60–1.05)
ESTIMATED GFR - FEMALE: 67 mL/min/BSA (ref >=60)
GLUCOSE: 134 mg/dL — ABNORMAL HIGH (ref 70–99)
POTASSIUM: 4.5 mmol/L (ref 3.5–5.1)
PROTEIN TOTAL: 7.2 g/dL (ref 6.0–8.0)
SODIUM: 144 mmol/L (ref 136–145)

## 2023-01-08 LAB — THYROID STIMULATING HORMONE (SENSITIVE TSH): TSH: 0.283 u[IU]/mL — ABNORMAL LOW (ref 0.350–4.940)

## 2023-01-08 LAB — CBC
HCT: 31.1 % — ABNORMAL LOW (ref 34.8–46.0)
HGB: 9.5 g/dL — ABNORMAL LOW (ref 11.5–16.0)
MCH: 24.9 pg — ABNORMAL LOW (ref 26.0–32.0)
MCHC: 30.5 g/dL — ABNORMAL LOW (ref 31.0–35.5)
MCV: 81.6 fL (ref 78.0–100.0)
MPV: 12.4 fL (ref 8.7–12.5)
PLATELETS: 116 10*3/uL — ABNORMAL LOW (ref 150–400)
RBC: 3.81 10*6/uL — ABNORMAL LOW (ref 3.85–5.22)
RDW-CV: 16.3 % — ABNORMAL HIGH (ref 11.5–15.5)
WBC: 5 10*3/uL (ref 3.7–11.0)

## 2023-01-08 LAB — HGA1C (HEMOGLOBIN A1C WITH EST AVG GLUCOSE)
ESTIMATED AVERAGE GLUCOSE: 123 mg/dL
HEMOGLOBIN A1C: 5.9 % — ABNORMAL HIGH (ref <=5.7)

## 2023-01-08 LAB — MICROALBUMIN/CREATININE RATIO, URINE, RANDOM
CREATININE RANDOM URINE: 69 mg/dL
MICROALBUMIN RANDOM URINE: 0.5 mg/dL
MICROALBUMIN/CREATININE RATIO RANDOM URINE: 7.2 mg/g (ref <30.0)

## 2023-01-10 DIAGNOSIS — M25552 Pain in left hip: Secondary | ICD-10-CM

## 2023-01-11 ENCOUNTER — Other Ambulatory Visit (INDEPENDENT_AMBULATORY_CARE_PROVIDER_SITE_OTHER): Payer: Self-pay | Admitting: FAMILY MEDICINE

## 2023-01-11 DIAGNOSIS — M25562 Pain in left knee: Secondary | ICD-10-CM

## 2023-01-11 DIAGNOSIS — E782 Mixed hyperlipidemia: Secondary | ICD-10-CM

## 2023-01-11 DIAGNOSIS — E1165 Type 2 diabetes mellitus with hyperglycemia: Secondary | ICD-10-CM

## 2023-01-22 ENCOUNTER — Other Ambulatory Visit (INDEPENDENT_AMBULATORY_CARE_PROVIDER_SITE_OTHER): Payer: Self-pay | Admitting: FAMILY MEDICINE

## 2023-01-22 DIAGNOSIS — E1165 Type 2 diabetes mellitus with hyperglycemia: Secondary | ICD-10-CM

## 2023-01-22 DIAGNOSIS — E782 Mixed hyperlipidemia: Secondary | ICD-10-CM

## 2023-01-23 ENCOUNTER — Inpatient Hospital Stay
Admission: RE | Admit: 2023-01-23 | Discharge: 2023-01-23 | Disposition: A | Discharge status: Home / Self Care | Payer: Medicare PPO | Source: Ambulatory Visit | Attending: FAMILY MEDICINE | Admitting: FAMILY MEDICINE

## 2023-01-23 ENCOUNTER — Other Ambulatory Visit: Payer: Self-pay

## 2023-01-23 DIAGNOSIS — M25562 Pain in left knee: Secondary | ICD-10-CM | POA: Insufficient documentation

## 2023-01-23 DIAGNOSIS — G8929 Other chronic pain: Secondary | ICD-10-CM | POA: Insufficient documentation

## 2023-01-24 DIAGNOSIS — M1712 Unilateral primary osteoarthritis, left knee: Secondary | ICD-10-CM

## 2023-01-28 ENCOUNTER — Telehealth (INDEPENDENT_AMBULATORY_CARE_PROVIDER_SITE_OTHER): Payer: Self-pay | Admitting: FAMILY MEDICINE

## 2023-01-28 ENCOUNTER — Other Ambulatory Visit: Payer: Self-pay

## 2023-01-28 ENCOUNTER — Ambulatory Visit: Payer: Medicare PPO | Attending: Primary Care | Admitting: Primary Care

## 2023-01-28 ENCOUNTER — Encounter (INDEPENDENT_AMBULATORY_CARE_PROVIDER_SITE_OTHER): Payer: Self-pay | Admitting: Primary Care

## 2023-01-28 ENCOUNTER — Other Ambulatory Visit: Payer: Medicare PPO | Attending: Primary Care

## 2023-01-28 VITALS — BP 124/63 | HR 94 | Temp 98.6°F | Resp 18 | Ht 64.0 in | Wt 210.0 lb

## 2023-01-28 DIAGNOSIS — Z87891 Personal history of nicotine dependence: Secondary | ICD-10-CM | POA: Insufficient documentation

## 2023-01-28 DIAGNOSIS — M79675 Pain in left toe(s): Secondary | ICD-10-CM | POA: Insufficient documentation

## 2023-01-28 DIAGNOSIS — L539 Erythematous condition, unspecified: Secondary | ICD-10-CM | POA: Insufficient documentation

## 2023-01-28 DIAGNOSIS — I10 Essential (primary) hypertension: Secondary | ICD-10-CM | POA: Insufficient documentation

## 2023-01-28 DIAGNOSIS — M109 Gout, unspecified: Secondary | ICD-10-CM | POA: Insufficient documentation

## 2023-01-28 LAB — CBC WITH DIFF
BASOPHIL #: <0.1 10*3/uL (ref <=0.20)
BASOPHIL %: 0 %
EOSINOPHIL #: 0.31 10*3/uL (ref <=0.50)
EOSINOPHIL %: 5 %
HCT: 30.5 % — ABNORMAL LOW (ref 34.8–46.0)
HGB: 9.6 g/dL — ABNORMAL LOW (ref 11.5–16.0)
IMMATURE GRANULOCYTE #: <0.1 10*3/uL (ref <0.10)
IMMATURE GRANULOCYTE %: 0 % (ref 0.0–1.0)
LYMPHOCYTE #: 1.23 10*3/uL (ref 1.00–4.80)
LYMPHOCYTE %: 21 %
MCH: 25.5 pg — ABNORMAL LOW (ref 26.0–32.0)
MCHC: 31.5 g/dL (ref 31.0–35.5)
MCV: 80.9 fL (ref 78.0–100.0)
MONOCYTE #: 0.52 10*3/uL (ref 0.20–1.10)
MONOCYTE %: 9 %
MPV: 11.4 fL (ref 8.7–12.5)
NEUTROPHIL #: 3.81 10*3/uL (ref 1.50–7.70)
NEUTROPHIL %: 65 %
PLATELETS: 135 10*3/uL — ABNORMAL LOW (ref 150–400)
RBC: 3.77 10*6/uL — ABNORMAL LOW (ref 3.85–5.22)
RDW-CV: 17 % — ABNORMAL HIGH (ref 11.5–15.5)
WBC: 5.9 10*3/uL (ref 3.7–11.0)

## 2023-01-28 LAB — URIC ACID: URIC ACID: 7.5 mg/dL — ABNORMAL HIGH (ref 2.9–6.3)

## 2023-01-28 MED ORDER — COLCHICINE 0.6 MG TABLET
ORAL_TABLET | ORAL | 0 refills | Status: DC
Start: 2023-01-28 — End: 2023-06-01

## 2023-01-28 NOTE — Nursing Note (Signed)
Body mass index is 36.05 kg/m.    Fall Risk Assessment       PHQ Questionnaire            Travel Screening       Question Response    Have you been in contact with someone who was sick? --    Do you have any of the following new or worsening symptoms? None of these    Have you traveled internationally in the last month? No          Travel History   Travel since 12/29/22    No documented travel since 12/29/22       Review Flowsheet  More data exists         01/05/2023   FUNCTIONAL HEALTH SCREENING   Because we are aware of abuse and domestic violence today, we ask all patients: Are you being hurt, hit, or frightened by anyone at your home or in your life?  N   Do you have any basic needs within your home that are not being met? (such as Food, Shelter, [employer] fast streamer, Tranportation, paying for bills and/or medications) N      Details                   Tamsen Snider, LPN  1/61/0960, 16:57

## 2023-01-28 NOTE — Telephone Encounter (Signed)
Pt called in reporting Gout flare-up in left toes, requesting she be sent in a prescription to reduce.

## 2023-01-28 NOTE — Progress Notes (Signed)
URGENT CARE, [facility]  [address] [place] [address]    Progress Note    Encounter Date: 01/28/2023    Patient ID:  Angela Frey  XBJ:[id number]    DOB: March 15, 1961  Age: 62 y.o. female  Subjective   Subjective:     Chief Complaint   Patient presents with    Toe Swelling     X 2 days Pt states she think it is GOUT     Patient is here today with above complaint. She has never had gout in the past, but states her sister is a Engineer, civil (consulting) and she believes she has gout. Symptoms started yesterday evening. Denies any fever, nausea, vomiting, or diarrhea. She is eating, drinking, and voiding normally. She has not taken any OTC medications. She states her pain in constant. Standing and walking make her toe pain worse.       Current Outpatient Medications   Medication Sig    albuterol sulfate (PROVENTIL OR VENTOLIN OR PROAIR) 90 mcg/actuation Inhalation oral inhaler Take 1-2 Puffs by inhalation Every 4 hours as needed    amitriptyline (ELAVIL) 25 mg Oral Tablet Take 1 Tablet (25 mg total) by mouth Every night    aspirin 81 mg Oral Tablet, Chewable Chew 1 Tablet (81 mg total) Once a day    baclofen (LIORESAL) 10 mg Oral Tablet Take 1 Tablet (10 mg total) by mouth Four times a day    Blood Sugar Diagnostic (ACCU-CHEK GUIDE TEST STRIPS) Strip 1 Strip by Does not apply route Four times a day    Blood-Glucose Meter Misc 4 times per day    budesonide-formoteroL (SYMBICORT) 160-4.5 mcg/actuation Inhalation oral inhaler Take 2 Puffs by inhalation Twice daily    colchicine 0.6 mg Oral Tablet 2 tabs PO x 1, then 1 tab PO 1 hour later    cyanocobalamin (VITAMIN B 12) 1,000 mcg Oral Tablet Take 1 Tablet (1,000 mcg total) by mouth Once a day    esomeprazole magnesium (NEXIUM) 40 mg Oral Capsule, Delayed Release(E.C.) TAKE 1 CAPSULE BY MOUTH IN THE MORNING BEFORE BREAKFAST    gabapentin (NEURONTIN) 600 mg Oral Tablet Take 1 Tablet (600 mg total) by mouth Three times a day    hydroCHLOROthiazide (HYDRODIURIL) 25 mg Oral  Tablet Take 1 Tablet (25 mg total) by mouth Once a day    ketoconazole (NIZORAL) 2 % Shampoo USE 1/2 TO 1 (ONE-HALF TO ONE) OUNCE OF SHAMPOO TOPICALLY THREE TIMES A WEEK    Lactobac no.41/Bifidobact no.7 (PROBIOTIC-10 ORAL) Take by mouth Once a day    Lancets (ACCU-CHEK SOFTCLIX LANCETS) Misc 1 Each by Does not apply route Four times a day    levothyroxine (SYNTHROID) 175 mcg Oral Tablet Take 1 Tablet (175 mcg total) by mouth Every morning    lisinopriL (PRINIVIL) 20 mg Oral Tablet Take 1 Tablet (20 mg total) by mouth Twice daily    meloxicam (MOBIC) 15 mg Oral Tablet Take 1 Tablet (15 mg total) by mouth Once per day as needed for Pain    metFORMIN (GLUCOPHAGE) 500 mg Oral Tablet Take 1 tablet by mouth twice daily with food    MOUNJARO 2.5 mg/0.5 mL Subcutaneous Pen Injector INJECT 1/2 (ONE-HALF) ML UNDER THE SKIN ONCE A WEEK    nystatin (MYCOSTATIN) 100,000 unit/gram Cream Apply 0.5 g t.i.d. to affected area.    nystatin (NYSTOP) 100,000 unit/gram Powder by Apply Topically route Three times a day as needed    pediatric multivitamins Oral Tablet, Chewable Chew  1 Tablet Once a day    pravastatin (PRAVACHOL) 10 mg Oral Tablet TAKE 1 TABLET BY MOUTH ONCE DAILY IN THE EVENING    sertraline (ZOLOFT) 100 mg Oral Tablet Take 1 Tablet (100 mg total) by mouth Once a day    tolterodine (DETROL LA) 4 mg Oral Capsule, Sust. Release 24 hr Take 1 Capsule (4 mg total) by mouth Once a day     Allergies   Allergen Reactions    Pseudoephedrine Hcl Hives/ Urticaria    Clindamycin Phosphate     Guaifenesin  Other Adverse Reaction (Add comment)    Cleocin [Clindamycin] Hives/ Urticaria    Doxylamin-Pse-Dm-Acetaminophen Hives/ Urticaria and  Other Adverse Reaction (Add comment)    Tessalon [Benzonatate] Hives/ Urticaria     Past Medical History:   Diagnosis Date    Asthma     Chronic obstructive airway disease (CMS HCC)     Diabetes (CMS HCC)     Hypertension     Hypothyroid          Past Surgical History:   Procedure Laterality Date     HX PELVIC LAPAROSCOPY      HX TONSILLECTOMY      HX TUBAL LIGATION      LEG SURGERY      2-3 surgeries    NECK SURGERY      TUBOPLASTY / TUBOTUBAL ANASTOMOSIS      ULNAR NERVE REPAIR Bilateral          Family Medical History:    None         Social History     Tobacco Use    Smoking status: Former     Types: Cigarettes    Smokeless tobacco: Never   Vaping Use    Vaping status: Never Used   Substance Use Topics    Alcohol use: Never    Drug use: Never       Review of Systems   Musculoskeletal:  Positive for joint swelling.   Skin:  Positive for color change.   All other systems reviewed and are negative.       Objective   Objective:   Vitals: BP 124/63 (Site: Left Arm, Patient Position: Sitting)   Pulse 94   Temp 37 C (98.6 F)   Resp 18   Ht 1.626 m (5\' 4" )   Wt 95.3 kg (210 lb)   LMP  (LMP Unknown) Comment: stopped having periods at aget 44-46  SpO2 94%   BMI 36.05 kg/m         Physical Exam  Vitals and nursing note reviewed.   Constitutional:       General: She is not in acute distress.     Appearance: Normal appearance. She is not ill-appearing, toxic-appearing or diaphoretic.   HENT:      Head: Normocephalic.      Right Ear: External ear normal.      Left Ear: External ear normal.      Nose: Nose normal.      Mouth/Throat:      Lips: Pink.      Mouth: Mucous membranes are moist.      Pharynx: Oropharynx is clear. Uvula midline. No oropharyngeal exudate or posterior oropharyngeal erythema.   Eyes:      General:         Right eye: No discharge.         Left eye: No discharge.      Extraocular Movements: Extraocular movements intact.  Conjunctiva/sclera: Conjunctivae normal.      Pupils: Pupils are equal, round, and reactive to light.   Cardiovascular:      Rate and Rhythm: Normal rate and regular rhythm.      Pulses: Normal pulses.      Heart sounds: Normal heart sounds.   Pulmonary:      Effort: Pulmonary effort is normal. No respiratory distress.      Breath sounds: Normal breath sounds. No  stridor. No wheezing, rhonchi or rales.   Chest:      Chest wall: No tenderness.   Abdominal:      General: Bowel sounds are normal. There is no distension.      Palpations: Abdomen is soft. There is no mass.      Tenderness: There is no abdominal tenderness. There is no guarding or rebound.      Hernia: No hernia is present.   Musculoskeletal:         General: Normal range of motion.      Cervical back: Normal range of motion and neck supple. No rigidity or tenderness.   Lymphadenopathy:      Cervical: No cervical adenopathy.   Skin:     General: Skin is warm and dry.      Capillary Refill: Capillary refill takes less than 2 seconds.      Findings: Erythema (left great TMP joint, faint, approx 2 cm x 3 cm on dorsal aspect, tender, no swelling) present.   Neurological:      Mental Status: She is alert and oriented to person, place, and time.      Gait: Gait normal.   Psychiatric:         Behavior: Behavior normal.          Assessment & Plan:     ENCOUNTER DIAGNOSES     ICD-10-CM   1. Great toe pain, left  M79.675   2. Erythema  L53.9   3. Gout, unspecified cause, unspecified chronicity, unspecified site  M10.9       Orders Placed This Encounter    CBC/DIFF    URIC ACID    colchicine 0.6 mg Oral Tablet     Appointment on 01/28/2023   Component Date Value Ref Range Status    URIC ACID 01/28/2023 7.5 (H)  2.9 - 6.3 mg/dL Final    WBC 29/52/8413 5.9  3.7 - 11.0 x10^3/uL Final    RBC 01/28/2023 3.77 (L)  3.85 - 5.22 x10^6/uL Final    HGB 01/28/2023 9.6 (L)  11.5 - 16.0 g/dL Final    HCT 24/40/1027 30.5 (L)  34.8 - 46.0 % Final    MCV 01/28/2023 80.9  78.0 - 100.0 fL Final    MCH 01/28/2023 25.5 (L)  26.0 - 32.0 pg Final    MCHC 01/28/2023 31.5  31.0 - 35.5 g/dL Final    RDW-CV 25/36/6440 17.0 (H)  11.5 - 15.5 % Final    PLATELETS 01/28/2023 135 (L)  150 - 400 x10^3/uL Final    MPV 01/28/2023 11.4  8.7 - 12.5 fL Final    NEUTROPHIL % 01/28/2023 65.0  % Final    LYMPHOCYTE % 01/28/2023 21.0  % Final    MONOCYTE %  01/28/2023 9.0  % Final    EOSINOPHIL % 01/28/2023 5.0  % Final    BASOPHIL % 01/28/2023 0.0  % Final    NEUTROPHIL # 01/28/2023 3.81  1.50 - 7.70 x10^3/uL Final    LYMPHOCYTE # 01/28/2023 1.23  1.00 -  4.80 x10^3/uL Final    MONOCYTE # 01/28/2023 0.52  0.20 - 1.10 x10^3/uL Final    EOSINOPHIL # 01/28/2023 0.31  <=0.50 x10^3/uL Final    BASOPHIL # 01/28/2023 <0.10  <=0.20 x10^3/uL Final    IMMATURE GRANULOCYTE % 01/28/2023 0.0  0.0 - 1.0 % Final    The immature granulocyte fraction (IGF) quantifies total circulating myelocytes, metamyelocytes, and promyelocytes. It is used to evaluate immune responses to infection, inflammation, or other stimuli of the bone marrow. Caution is advised in interpreting test results in neonates who normally have greater numbers of circulating immature blood cells.      IMMATURE GRANULOCYTE # 01/28/2023 <0.10  <0.10 x10^3/uL Final     See Patient Instructions.    Return in about 1 day (around 01/29/2023).    Adele Dan, NP

## 2023-01-31 NOTE — PT Treatment (Deleted)
[facility]  Discharge Summary       Patient Name: Angela Frey  Patient DOB: [birth year]  MRN: [id number]     Diagnosis:   Pain of left hip [M25.552]      Rib pain [R07.81]      Back pain, unspecified back location, unspecified back pain laterality, unspecified chronicity [M54.9]         Referring Provider: Bing Plume, DO     Physical Therapy Discharge     Date: 01/31/2023     Referred to physical therapy secondary to:   Pain of left hip [M25.552]      Rib pain [R07.81]      Back pain, unspecified back location, unspecified back pain laterality, unspecified chronicity [M54.9]            Summary:             # visits attended: 7            Interventions performed: therapeutic exercises, ultrasound, neuromuscular re-education activities, manual therapy             Most recent progress update results: Since the initial evaluation, pt has made a small improvement in Oswestry score,ability to perform multisegmental flexion and R multisegmental rotation per [facility] without reports of increased pain and pt reports some improvement in symptoms temporarily after sessions (see objective and subjective). Pt has not demonstrated an improvement in bilateral hip IR AROM and demonstrated a regression in multisegmental L rotation per [facility] due to reports of increased pain now with the movement (see objective).       HEP Issued:   Access Code: [email or web address]  URL: [email or web address]  Date: 12/13/2022  Prepared by: Enos Fling     Program Notes  NO SHARP PAIN WITH EXERCISE      Exercises  - Supine Quad Set  - 2 x daily - 7 x weekly - 2 sets - 10 reps - 3 sec hold  - Supine Single Knee to Chest Stretch  - 2 x daily - 7 x weekly - 2 sets - 10 reps - 5 sec hold  - Bent Knee Fallouts  - 2 x daily - 7 x weekly - 2 sets - 10 reps - 3 sec hold  - Seated Long Arc Quad  - 2 x daily - 7 x weekly - 2 sets - 20 reps - 3 sec hold  - Supine Lower Trunk Rotation  - 1-2 x daily - 7 x weekly -  1-2 minutes  perform for   - Supine Bridge  - 1-2 x daily - 7 x weekly - 2 sets - 10 reps  - Seated Figure 4 Piriformis Stretch  - 1-2 x daily - 7 x weekly - 1 sets - 1 reps - 60 sec hold  - Seated Flexion Stretch  - 1-2 x daily - 7 x weekly - 2 reps - 30 sec hold  - Supine March  - 1-2 x daily - 7 x weekly - 2 sets - 10 reps  - Supine Posterior Pelvic Tilt  - 1-2 x daily - 7 x weekly - 2 sets - 10 reps     Goals:   Short Term Goals  In 3-4 weeks:  1) Pt will be independent in a HEP for long term management of symptoms and function.   MET AS OF 12/30/22   2) Pt will score 28% or less on the Oswestry Low Back Pain Disability  Questionnaire.   IN PROGRESS AS OF 12/30/22 (37.77%)         Long Term Goals  In 6-8 weeks:  1) Pt will score 16% or less on the Oswestry Low Back Pain Disability Questionnaire.   IN PROGRESS AS OF 12/30/22 (37.77%)   2) Pt will demonstrate at least 32 degrees hip IR AROM bilaterally to improve the pt's ability to perform household related activities with less difficulty.   IN PROGRESS AS OF 12/30/22 ( R:   26     L: 20 )   3)  Pt will demonstrate nonpainful multisegmental flexion per Selective Functional Movement Assessment to improve the pt's ability to perform self care and household related activities with less difficulty.   MET AS OF 12/30/22   4) Pt will demonstrate nonpainful multisegmental rotation bilaterally per Selective Functional Movement Assessment to improve the pt's ability to ambulate with less difficulty.   IN PROGRESS AS OF 12/30/22 (R met, L painful)   5) Pt will give subjective reports of at least 75% improvement in symptoms.         Reason for Discharge:  Over 30 days since last session. At last session, Based on pt's subjective reports of a busy schedule and noted small improvement, pt and therapist agreed to hold case due to pt's busy schedule, but to also consult back with referring physician to determine next course in care. Provided print out of HEP for management.       Belia Heman, DPT  [phone]

## 2023-01-31 NOTE — PT Treatment (Signed)
[facility]  Discharge Summary       Patient Name: Angela Frey  Patient DOB: [birth year]  MRN: [id number]     Diagnosis:   Pain of left hip [M25.552]      Rib pain [R07.81]      Back pain, unspecified back location, unspecified back pain laterality, unspecified chronicity [M54.9]         Referring Provider: Bing Plume, DO     Physical Therapy Discharge     Date: 01/31/2023     Referred to physical therapy secondary to:   Pain of left hip [M25.552]      Rib pain [R07.81]      Back pain, unspecified back location, unspecified back pain laterality, unspecified chronicity [M54.9]            Summary:             # visits attended: 7            Interventions performed: therapeutic exercises, ultrasound, neuromuscular re-education activities, manual therapy             Most recent progress update results: Since the initial evaluation, pt has made a small improvement in Oswestry score,ability to perform multisegmental flexion and R multisegmental rotation per [facility] without reports of increased pain and pt reports some improvement in symptoms temporarily after sessions (see objective and subjective). Pt has not demonstrated an improvement in bilateral hip IR AROM and demonstrated a regression in multisegmental L rotation per [facility] due to reports of increased pain now with the movement (see objective).       HEP Issued:   Access Code: [email or web address]  URL: [email or web address]  Date: 12/13/2022  Prepared by: Enos Fling     Program Notes  NO SHARP PAIN WITH EXERCISE      Exercises  - Supine Quad Set  - 2 x daily - 7 x weekly - 2 sets - 10 reps - 3 sec hold  - Supine Single Knee to Chest Stretch  - 2 x daily - 7 x weekly - 2 sets - 10 reps - 5 sec hold  - Bent Knee Fallouts  - 2 x daily - 7 x weekly - 2 sets - 10 reps - 3 sec hold  - Seated Long Arc Quad  - 2 x daily - 7 x weekly - 2 sets - 20 reps - 3 sec hold  - Supine Lower Trunk Rotation  - 1-2 x daily - 7 x weekly -  1-2 minutes  perform for   - Supine Bridge  - 1-2 x daily - 7 x weekly - 2 sets - 10 reps  - Seated Figure 4 Piriformis Stretch  - 1-2 x daily - 7 x weekly - 1 sets - 1 reps - 60 sec hold  - Seated Flexion Stretch  - 1-2 x daily - 7 x weekly - 2 reps - 30 sec hold  - Supine March  - 1-2 x daily - 7 x weekly - 2 sets - 10 reps  - Supine Posterior Pelvic Tilt  - 1-2 x daily - 7 x weekly - 2 sets - 10 reps     Goals:   Short Term Goals  In 3-4 weeks:  1) Pt will be independent in a HEP for long term management of symptoms and function.   MET AS OF 12/30/22   2) Pt will score 28% or less on the Oswestry Low Back Pain Disability  Questionnaire.   IN PROGRESS AS OF 12/30/22 (37.77%)         Long Term Goals  In 6-8 weeks:  1) Pt will score 16% or less on the Oswestry Low Back Pain Disability Questionnaire.   IN PROGRESS AS OF 12/30/22 (37.77%)   2) Pt will demonstrate at least 32 degrees hip IR AROM bilaterally to improve the pt's ability to perform household related activities with less difficulty.   IN PROGRESS AS OF 12/30/22 ( R:   26     L: 20 )   3)  Pt will demonstrate nonpainful multisegmental flexion per Selective Functional Movement Assessment to improve the pt's ability to perform self care and household related activities with less difficulty.   MET AS OF 12/30/22   4) Pt will demonstrate nonpainful multisegmental rotation bilaterally per Selective Functional Movement Assessment to improve the pt's ability to ambulate with less difficulty.   IN PROGRESS AS OF 12/30/22 (R met, L painful)   5) Pt will give subjective reports of at least 75% improvement in symptoms.         Reason for Discharge:  Over 30 days since last session. At last session, Based on pt's subjective reports of a busy schedule and noted small improvement, pt and therapist agreed to hold case due to pt's busy schedule, but to also consult back with referring physician to determine next course in care. Provided print out of HEP for management.       Belia Heman, DPT  [phone]

## 2023-02-01 ENCOUNTER — Other Ambulatory Visit (INDEPENDENT_AMBULATORY_CARE_PROVIDER_SITE_OTHER): Payer: Self-pay | Admitting: FAMILY MEDICINE

## 2023-02-01 ENCOUNTER — Telehealth (INDEPENDENT_AMBULATORY_CARE_PROVIDER_SITE_OTHER): Payer: Self-pay | Admitting: FAMILY MEDICINE

## 2023-02-01 DIAGNOSIS — E119 Type 2 diabetes mellitus without complications: Secondary | ICD-10-CM

## 2023-02-01 DIAGNOSIS — E1165 Type 2 diabetes mellitus with hyperglycemia: Secondary | ICD-10-CM

## 2023-02-01 NOTE — Telephone Encounter (Signed)
Dr. Wonda Olds,     Patient called in stating she took her last dose of [facility] Sunday. Patient states she is not having any problems with the medication and would like a increase. Patient is scheduled in August for a follow up.     Thanks,   Lurena Joiner LPN

## 2023-02-16 ENCOUNTER — Other Ambulatory Visit (HOSPITAL_COMMUNITY): Payer: Medicare PPO

## 2023-02-16 ENCOUNTER — Ambulatory Visit: Payer: Medicare PPO | Attending: FAMILY MEDICINE | Admitting: FAMILY MEDICINE

## 2023-02-16 ENCOUNTER — Encounter (INDEPENDENT_AMBULATORY_CARE_PROVIDER_SITE_OTHER): Payer: Self-pay | Admitting: FAMILY MEDICINE

## 2023-02-16 ENCOUNTER — Other Ambulatory Visit (INDEPENDENT_AMBULATORY_CARE_PROVIDER_SITE_OTHER): Payer: Self-pay | Admitting: FAMILY MEDICINE

## 2023-02-16 ENCOUNTER — Other Ambulatory Visit: Payer: Self-pay

## 2023-02-16 VITALS — BP 108/52 | HR 87 | Temp 97.8°F | Ht 64.0 in | Wt 209.0 lb

## 2023-02-16 DIAGNOSIS — Z87891 Personal history of nicotine dependence: Secondary | ICD-10-CM | POA: Insufficient documentation

## 2023-02-16 DIAGNOSIS — Z7182 Exercise counseling: Secondary | ICD-10-CM | POA: Insufficient documentation

## 2023-02-16 DIAGNOSIS — Z6835 Body mass index (BMI) 35.0-35.9, adult: Secondary | ICD-10-CM | POA: Insufficient documentation

## 2023-02-16 DIAGNOSIS — Z7984 Long term (current) use of oral hypoglycemic drugs: Secondary | ICD-10-CM | POA: Insufficient documentation

## 2023-02-16 DIAGNOSIS — N289 Disorder of kidney and ureter, unspecified: Secondary | ICD-10-CM

## 2023-02-16 DIAGNOSIS — Z7985 Long-term (current) use of injectable non-insulin antidiabetic drugs: Secondary | ICD-10-CM | POA: Insufficient documentation

## 2023-02-16 DIAGNOSIS — I700 Atherosclerosis of aorta: Secondary | ICD-10-CM

## 2023-02-16 DIAGNOSIS — R21 Rash and other nonspecific skin eruption: Secondary | ICD-10-CM | POA: Insufficient documentation

## 2023-02-16 DIAGNOSIS — Z79899 Other long term (current) drug therapy: Secondary | ICD-10-CM

## 2023-02-16 DIAGNOSIS — E119 Type 2 diabetes mellitus without complications: Secondary | ICD-10-CM | POA: Insufficient documentation

## 2023-02-16 DIAGNOSIS — M109 Gout, unspecified: Secondary | ICD-10-CM | POA: Insufficient documentation

## 2023-02-16 DIAGNOSIS — Z713 Dietary counseling and surveillance: Secondary | ICD-10-CM | POA: Insufficient documentation

## 2023-02-16 DIAGNOSIS — E790 Hyperuricemia without signs of inflammatory arthritis and tophaceous disease: Secondary | ICD-10-CM

## 2023-02-16 LAB — BASIC METABOLIC PANEL
ANION GAP: 10 mmol/L (ref 4–13)
BUN/CREA RATIO: 19 (ref 6–22)
BUN: 24 mg/dL (ref 8–25)
CALCIUM: 10.7 mg/dL — ABNORMAL HIGH (ref 8.6–10.3)
CHLORIDE: 105 mmol/L (ref 96–111)
CO2 TOTAL: 27 mmol/L (ref 23–31)
CREATININE: 1.26 mg/dL — ABNORMAL HIGH (ref 0.60–1.05)
ESTIMATED GFR - FEMALE: 48 mL/min/BSA — ABNORMAL LOW (ref >=60)
GLUCOSE: 118 mg/dL (ref 65–125)
POTASSIUM: 5.1 mmol/L (ref 3.5–5.1)
SODIUM: 142 mmol/L (ref 136–145)

## 2023-02-16 MED ORDER — MOUNJARO 5 MG/0.5 ML SUBCUTANEOUS PEN INJECTOR
5.0000 mg | PEN_INJECTOR | SUBCUTANEOUS | 0 refills | Status: DC
Start: 2023-02-16 — End: 2023-08-05

## 2023-02-16 MED ORDER — KETOROLAC 60 MG/2 ML INTRAMUSCULAR SOLUTION
30.0000 mg | Freq: Once | INTRAMUSCULAR | Status: AC
Start: 2023-02-16 — End: 2023-02-16
  Administered 2023-02-16: 30 mg via INTRAMUSCULAR

## 2023-02-16 MED ORDER — DEXAMETHASONE SODIUM PHOSPHATE 4 MG/ML INJECTION SOLUTION
4.0000 mg | INTRAMUSCULAR | Status: AC
Start: 2023-02-16 — End: 2023-02-16
  Administered 2023-02-16: 4 mg via INTRAMUSCULAR

## 2023-02-16 MED ORDER — PREDNISONE 20 MG TABLET
20.0000 mg | ORAL_TABLET | Freq: Every day | ORAL | 0 refills | Status: AC
Start: 2023-02-16 — End: 2023-02-21

## 2023-02-16 NOTE — Progress Notes (Signed)
FAMILY MEDICINE, MEDICAL OFFICE BUILDING 5  [address]  [place] [address]      Operated by [facility]     Name: Angela Frey MRN:  [id number]   Date: 02/16/2023 Age: 62 y.o.      Chief Complaint   Patient presents with    Rash     Pt states that she has a rash on her chest and stomach since yesterday.       SUBJECTIVE    Patient is 62 y.o. she  presents today for complaint of a rash on her stomach and chest since yesterday. It itched yesterday but not today.   She use a shower wash she has not used for sometime and she used it yesterday.   She is doing well on Mounjaro. She reports no side effects and her blood sugar is doing better.   She has started back on Tumeric and her asa. She has been taking them for over 2 weeks.   She report she went to the clinic for gout it was the first time she had it they did labs and gave her a medication.     ROS:      ROS - pertinent for presenting problem.  Systems otherwise negative than what has been noted.    PAST MEDICAL HISTORY  I have reviewed and updated as appropriate the past medical, surgical, family, and social history today:    Medical History/Surgical History/Family History/Social History  Past Medical History:   Diagnosis Date    Asthma     Chronic obstructive airway disease (CMS HCC)     Diabetes (CMS HCC)     Hypertension     Hypothyroid          Past Surgical History:   Procedure Laterality Date    HX PELVIC LAPAROSCOPY      HX TONSILLECTOMY      HX TUBAL LIGATION      HYSTEROSCOPY WITH DILATION AND CURETTAGE N/A 04/09/2021    Performed by Seward Meth, DO at [facility]    LEG SURGERY      2-3 surgeries    NECK SURGERY      TUBOPLASTY / TUBOTUBAL ANASTOMOSIS      ULNAR NERVE REPAIR Bilateral      Family Medical History:    None          Social History     Socioeconomic History    Marital status: Widowed   Tobacco Use    Smoking status: Former     Types: Cigarettes    Smokeless tobacco: Never   Vaping Use     Vaping status: Never Used   Substance and Sexual Activity    Alcohol use: Never    Drug use: Never     Social Determinants of Health     Financial Resource Strain: Low Risk  (03/02/2022)    Financial Resource Strain     SDOH Financial: No   Transportation Needs: Low Risk  (03/02/2022)    Transportation Needs     SDOH Transportation: No   Social Connections: Medium Risk (03/02/2022)    Social Connections     SDOH Social Isolation: 3 to 5 times a week   Intimate Partner Violence: Low Risk  (04/23/2022)    Intimate Partner Violence     SDOH Domestic Violence: No   Housing Stability: Low Risk  (03/02/2022)    Housing Stability     SDOH Housing Situation: I have housing.  SDOH Housing Worry: No       Allergies:  Allergies   Allergen Reactions    Pseudoephedrine Hcl Hives/ Urticaria    Clindamycin Phosphate     Guaifenesin  Other Adverse Reaction (Add comment)    Cleocin [Clindamycin] Hives/ Urticaria    Doxylamin-Pse-Dm-Acetaminophen Hives/ Urticaria and  Other Adverse Reaction (Add comment)    Tessalon [Benzonatate] Hives/ Urticaria       Medications:  Current Outpatient Medications   Medication Sig    albuterol sulfate (PROVENTIL OR VENTOLIN OR PROAIR) 90 mcg/actuation Inhalation oral inhaler Take 1-2 Puffs by inhalation Every 4 hours as needed    amitriptyline (ELAVIL) 25 mg Oral Tablet Take 1 Tablet (25 mg total) by mouth Every night    aspirin 81 mg Oral Tablet, Chewable Chew 1 Tablet (81 mg total) Once a day    baclofen (LIORESAL) 10 mg Oral Tablet Take 1 Tablet (10 mg total) by mouth Four times a day    Blood Sugar Diagnostic (ACCU-CHEK GUIDE TEST STRIPS) Strip 1 Strip by Does not apply route Four times a day    Blood-Glucose Meter Misc 4 times per day    budesonide-formoteroL (SYMBICORT) 160-4.5 mcg/actuation Inhalation oral inhaler Take 2 Puffs by inhalation Twice daily    colchicine 0.6 mg Oral Tablet 2 tabs PO x 1, then 1 tab PO 1 hour later    cyanocobalamin (VITAMIN B 12) 1,000 mcg Oral Tablet Take 1 Tablet  (1,000 mcg total) by mouth Once a day    esomeprazole magnesium (NEXIUM) 40 mg Oral Capsule, Delayed Release(E.C.) TAKE 1 CAPSULE BY MOUTH IN THE MORNING BEFORE BREAKFAST    fluorouraciL (EFUDEX) 5 % Cream Apply topically    gabapentin (NEURONTIN) 600 mg Oral Tablet Take 1 Tablet (600 mg total) by mouth Three times a day    hydroCHLOROthiazide (HYDRODIURIL) 25 mg Oral Tablet Take 1 Tablet (25 mg total) by mouth Once a day    ketoconazole (NIZORAL) 2 % Shampoo USE 1/2 TO 1 (ONE-HALF TO ONE) OUNCE OF SHAMPOO TOPICALLY THREE TIMES A WEEK    Lactobac no.41/Bifidobact no.7 (PROBIOTIC-10 ORAL) Take by mouth Once a day    Lancets (ACCU-CHEK SOFTCLIX LANCETS) Misc 1 Each by Does not apply route Four times a day    levothyroxine (SYNTHROID) 175 mcg Oral Tablet Take 1 Tablet (175 mcg total) by mouth Every morning    lisinopriL (PRINIVIL) 20 mg Oral Tablet Take 1 Tablet (20 mg total) by mouth Twice daily    meloxicam (MOBIC) 15 mg Oral Tablet Take 1 Tablet (15 mg total) by mouth Once per day as needed for Pain    metFORMIN (GLUCOPHAGE) 500 mg Oral Tablet Take 1 tablet by mouth twice daily with food    nystatin (MYCOSTATIN) 100,000 unit/gram Cream Apply 0.5 g t.i.d. to affected area.    nystatin (NYSTOP) 100,000 unit/gram Powder by Apply Topically route Three times a day as needed    pediatric multivitamins Oral Tablet, Chewable Chew 1 Tablet Once a day    pravastatin (PRAVACHOL) 10 mg Oral Tablet TAKE 1 TABLET BY MOUTH ONCE DAILY IN THE EVENING    predniSONE (DELTASONE) 20 mg Oral Tablet Take 1 Tablet (20 mg total) by mouth Once a day for 5 days    sertraline (ZOLOFT) 100 mg Oral Tablet Take 1 Tablet (100 mg total) by mouth Once a day    tirzepatide (MOUNJARO) 5 mg/0.5 mL Subcutaneous Pen Injector Inject 0.5 mL (5 mg total) under the skin Every 7 days  tolterodine (DETROL LA) 4 mg Oral Capsule, Sust. Release 24 hr Take 1 Capsule (4 mg total) by mouth Once a day    triamcinolone acetonide 0.1 % Cream Apply topically        Immunizations:  Immunization History   Administered Date(s) Administered    Covid-19 Vaccine,Pfizer-BioNTech,Purple Top,33yrs+ 09/22/2019, 10/13/2019    DIPTH,PERTUSSIS-ACEL,TETANUS >10 YRS OLD 06/13/2012    Flu Family 05/28/2013    High-Dose Influenza Vaccine, 65+ 06/01/2011    Influenza Vaccine, 6 month-adult 06/05/2012, 05/21/2014, 05/28/2015, 05/17/2017, 05/14/2019       OBJECTIVE    BP (!) 108/52   Pulse 87   Temp 36.6 C (97.8 F)   Ht 1.626 m (5\' 4" )   Wt 94.8 kg (209 lb)   LMP  (LMP Unknown) Comment: stopped having periods at aget 44-46  SpO2 93%   BMI 35.87 kg/m       Physical Exam:  General: Pleasant, WNWD, NAD, AAO  Head: Normocephalic, AT,no lesions. Bruise on right check.    Eyes: PERL, EOM's full, conjunctivae clear, fundi grossly normal.   Nose:  no rhinnorhea noted, no lesions  Throat: Clear, no exudates, no lesions MMM and pink.  Neck: Supple, no masses, no thyromegaly, no bruits.   Chest: Lungs clear, no rales, no rhonchi, no wheezes.   Heart: RRR, no murmurs, no rubs, no gallops.   Abdomen: Soft, no tenderness, no masses, BS normal.    Extremities: Normal gait, no deformities, no edema.    Neuro: Physiological, no localizing findings.   Skin:  Normal PWD, diffuse macular rash chest, abdomen and sides.   Psych: Mood and Affect normal      ASSESSMENT:        ICD-10-CM    1. Rash  R21 predniSONE (DELTASONE) 20 mg Oral Tablet      2. Abdominal aortic atherosclerosis (CMS HCC)  I70.0       3. Diabetes mellitus (CMS HCC)  E11.9 tirzepatide (MOUNJARO) 5 mg/0.5 mL Subcutaneous Pen Injector      4. Elevated uric acid in blood  E79.0       5. Acute gout, unspecified cause, unspecified site  M10.9               Body mass index is 35.87 kg/m. BMI addressed: Advised on diet, weight loss, and exercise to reduce above normal BMI.        Laboratory studies/data reviewed  I have reviewed all available and pertinent laboratory studies, images and health maintenance.    PLAN:    Records reviewed  today:  Refill needed meds. ATTACHED  Pt was educated/ counseled on the decisions made today with their involvement in these plans.  Immunizations:  Encourage COVID booster  Preventive counseling:  Continue to monitor blood sugars and blood pressures  Diet and exercise reviewed  See dentist and eye doctor with regular scheduled visits.  Active listening/Asked pertinent questions.    A total of (23) minutes was spent on this patient encounter including review of historical information, examination, documentation and post visit activities.  Follow-up with all specialists  Give solumedrol injection today  rash do not use the body wash  Take prednisone as directed rash   Give Toradol injection today   Bmp check today will call with results   Increase mounjaro to 0.5 mg use as directed DMII continue to monitor BS at home and bring logs to apt.   She went to the clinic for gout. Reviewed note 01/28/2023 and ua and cbc  results with ptn today   Continue all prescribed medications as directed    BMI addressed: Advised on diet, weight loss, and exercise to reduce above normal BMI.    DMII - Follow a diabetic diet such as the ADA diet (American Diabetes Association). Take all medication as directed. Exercise and loose weight because even a 10 lb weight loss can make a big difference in your blood sugar. Keep an exercise log and record your weight once a week. Keep a blood sugar log and bring this to all appointments.  Recognize signs, symptoms, and treatment of hypoglycemia (treatment:  Glucose of any form of glucose-containing carbohydrate).  Control blood pressure and in here with antihypertensive medications.  Follow a low-carbohydrate low-fat diet, especially low in saturated fats.  Lower sodium intake.  Get regular aerobic exercise with a goal of at least 150 minutes per week of moderate intensity exercises.  Maintain a healthy body weight.    FOLLOW UP:  Return for change aug apt to Medicare wellness .    Patient can  return sooner if needed.      The patient/care give was given ample opportunity to ask questions and those questions were answered to his/her satisfaction. A good faith effort was made to reconcile the patient's medications. The patient/caregiver was counseled on any appropriate vaccinations by the provider and questions were answered. The patient/care giver was told to contact me with any additional questions or concerns, or go to the ED in an emergency.       Bing Plume, DO. 02/16/23 13:40  [facility]  [facility]  [address]  [place], [place] [address]  Phone [phone]    This note may have been partially generated using M-Modal Fluency Direct system, and there may be some incorrect words, spellings, and punctuation that were not noted in checking the note before saving.

## 2023-02-16 NOTE — Addendum Note (Signed)
Addended by: Claudia Desanctis on: 02/16/2023 01:48 PM     Modules accepted: Orders

## 2023-02-16 NOTE — Addendum Note (Signed)
Addended by: Camelia Eng on: 02/16/2023 01:56 PM     Modules accepted: Orders

## 2023-02-16 NOTE — Nursing Note (Signed)
Body mass index is 35.87 kg/m.    Fall Risk Assessment         PHQ Questionnaire                 09/03/2022     2:57 PM   GAD-7 Questionnaire   Feeling nervous,anxious,on edge 0   Not being able to stop or control worrying 2   Worrying too much about different things 2   Trouble relaxing 0   Being so restless that it is hard to sit still 0   Becoming easily annoyed or irritable 0   Feeling afraid as if something awful might happen 0   How difficult have these problems made it for you to work, take care of things at home, or get along with other people? Somewhat difficult   Gad-7 Score Total 4   Interpretation 0-4, normal         Review Flowsheet  More data exists         01/05/2023   FUNCTIONAL HEALTH SCREENING   Because we are aware of abuse and domestic violence today, we ask all patients: Are you being hurt, hit, or frightened by anyone at your home or in your life?  N   Do you have any basic needs within your home that are not being met? (such as Food, Shelter, [employer] fast streamer, Tranportation, paying for bills and/or medications) N      Details                    Travel Screening       Question Response    Have you been in contact with someone who was sick? --    Do you have any of the following new or worsening symptoms? None of these    Have you traveled internationally in the last month? No          Travel History   Travel since 01/16/23    No documented travel since 01/16/23         Sharen Hones, CMA  02/16/2023, 13:09

## 2023-02-17 ENCOUNTER — Ambulatory Visit (INDEPENDENT_AMBULATORY_CARE_PROVIDER_SITE_OTHER): Payer: Self-pay | Admitting: FAMILY MEDICINE

## 2023-02-21 ENCOUNTER — Telehealth (INDEPENDENT_AMBULATORY_CARE_PROVIDER_SITE_OTHER): Payer: Self-pay | Admitting: FAMILY MEDICINE

## 2023-02-21 NOTE — Telephone Encounter (Signed)
Angela Frey called in and stated that the steroid shot you gave her made her sick. She also stated that the rash has spread.    [name], Unit [place]

## 2023-02-24 ENCOUNTER — Telehealth (INDEPENDENT_AMBULATORY_CARE_PROVIDER_SITE_OTHER): Payer: Self-pay | Admitting: FAMILY MEDICINE

## 2023-02-24 NOTE — Telephone Encounter (Signed)
[name] called the office and said that she left a message with the nurse a week ago and has not heard anything. I said I'm sorry sometimes you get the wrong extension and possibly we didn't get the message. She said, "are you telling me I dont know what Im doing"? I said no, I am just saying that might have been what happened because we are not seeing anything in the chart. [name] looked while I was on the phone and found a message that [name] had talked to her. I told her that we found it and she had talked to the receptionist. [name] said I am coming in there I need to be seen. I asked her what she needed seen for and she said I need to see Dr. Wonda Olds. I asked again what was going on and after the 3rd time she told me she has a rash. I said she does not have any openings today. She said well I am coming in there. I asked Catie to hold while we checked with Dr. Wonda Olds to see if we could get her in. [name] went back and talked to Dr. Wonda Olds and she said that she could not see her today that she needs to go to one of the walk in clinics or ED. She said I am not going to the walk in clinic they dont have any doctors there. I said well, Dr. Wonda Olds does not have any openings so you will need to go to the walk in or the ED. [name] said what is that supposed to mean. I said it means you need to go the walk in clinic or the Ed. She said I heard you the first time. I said do you need anything else and she said THANKS. Comments added by Merdis Delay, MA on 02/24/23 at 13:21.

## 2023-02-25 ENCOUNTER — Other Ambulatory Visit (INDEPENDENT_AMBULATORY_CARE_PROVIDER_SITE_OTHER): Payer: Self-pay | Admitting: FAMILY MEDICINE

## 2023-02-25 DIAGNOSIS — M62838 Other muscle spasm: Secondary | ICD-10-CM

## 2023-02-25 DIAGNOSIS — I10 Essential (primary) hypertension: Secondary | ICD-10-CM

## 2023-02-25 DIAGNOSIS — E1165 Type 2 diabetes mellitus with hyperglycemia: Secondary | ICD-10-CM

## 2023-03-07 ENCOUNTER — Telehealth (INDEPENDENT_AMBULATORY_CARE_PROVIDER_SITE_OTHER): Payer: Self-pay | Admitting: PULMONARY DISEASE

## 2023-03-07 ENCOUNTER — Ambulatory Visit (INDEPENDENT_AMBULATORY_CARE_PROVIDER_SITE_OTHER): Payer: Self-pay | Admitting: FAMILY MEDICINE

## 2023-03-07 NOTE — Telephone Encounter (Signed)
Please call discuss with the patient was low-dose CT scan of the chest.  Has cirrhosis of the CT scan.  Please make sure the family doctor gets a copy of this as well.

## 2023-03-08 ENCOUNTER — Other Ambulatory Visit (INDEPENDENT_AMBULATORY_CARE_PROVIDER_SITE_OTHER): Payer: Self-pay | Admitting: NURSE PRACTITIONER

## 2023-03-08 DIAGNOSIS — K7460 Unspecified cirrhosis of liver: Secondary | ICD-10-CM

## 2023-03-08 NOTE — Telephone Encounter (Signed)
Called patient and updated to results.  I will refer her to GI she is currently in the process of changing PCPs.

## 2023-03-08 NOTE — Telephone Encounter (Signed)
Attempted to call. Went to VM. Left message to return call.

## 2023-03-10 ENCOUNTER — Encounter (INDEPENDENT_AMBULATORY_CARE_PROVIDER_SITE_OTHER): Payer: Self-pay | Admitting: NURSE PRACTITIONER

## 2023-03-10 ENCOUNTER — Other Ambulatory Visit: Payer: Self-pay

## 2023-03-10 ENCOUNTER — Other Ambulatory Visit (INDEPENDENT_AMBULATORY_CARE_PROVIDER_SITE_OTHER): Payer: Self-pay

## 2023-03-10 ENCOUNTER — Inpatient Hospital Stay (HOSPITAL_BASED_OUTPATIENT_CLINIC_OR_DEPARTMENT_OTHER)
Admission: RE | Admit: 2023-03-10 | Discharge: 2023-03-10 | Disposition: A | Discharge status: Home / Self Care | Payer: Medicare PPO | Source: Ambulatory Visit | Attending: NURSE PRACTITIONER | Admitting: NURSE PRACTITIONER

## 2023-03-10 ENCOUNTER — Ambulatory Visit: Payer: Medicare PPO | Attending: NURSE PRACTITIONER | Admitting: NURSE PRACTITIONER

## 2023-03-10 VITALS — BP 137/71 | HR 98 | Temp 97.3°F | Resp 16 | Ht 62.0 in | Wt 203.0 lb

## 2023-03-10 DIAGNOSIS — J302 Other seasonal allergic rhinitis: Secondary | ICD-10-CM | POA: Insufficient documentation

## 2023-03-10 DIAGNOSIS — J432 Centrilobular emphysema: Secondary | ICD-10-CM

## 2023-03-10 DIAGNOSIS — G4733 Obstructive sleep apnea (adult) (pediatric): Secondary | ICD-10-CM | POA: Insufficient documentation

## 2023-03-10 DIAGNOSIS — Z87891 Personal history of nicotine dependence: Secondary | ICD-10-CM | POA: Insufficient documentation

## 2023-03-10 DIAGNOSIS — E063 Autoimmune thyroiditis: Secondary | ICD-10-CM | POA: Insufficient documentation

## 2023-03-10 DIAGNOSIS — I10 Essential (primary) hypertension: Secondary | ICD-10-CM

## 2023-03-10 DIAGNOSIS — E038 Other specified hypothyroidism: Secondary | ICD-10-CM | POA: Insufficient documentation

## 2023-03-10 DIAGNOSIS — Z6835 Body mass index (BMI) 35.0-35.9, adult: Secondary | ICD-10-CM | POA: Insufficient documentation

## 2023-03-10 DIAGNOSIS — E119 Type 2 diabetes mellitus without complications: Secondary | ICD-10-CM | POA: Insufficient documentation

## 2023-03-10 DIAGNOSIS — J4520 Mild intermittent asthma, uncomplicated: Secondary | ICD-10-CM | POA: Insufficient documentation

## 2023-03-10 DIAGNOSIS — E6609 Other obesity due to excess calories: Secondary | ICD-10-CM

## 2023-03-10 DIAGNOSIS — R918 Other nonspecific abnormal finding of lung field: Secondary | ICD-10-CM

## 2023-03-10 DIAGNOSIS — Z9981 Dependence on supplemental oxygen: Secondary | ICD-10-CM

## 2023-03-10 DIAGNOSIS — E669 Obesity, unspecified: Secondary | ICD-10-CM | POA: Insufficient documentation

## 2023-03-10 MED ORDER — BUDESONIDE-FORMOTEROL HFA 160 MCG-4.5 MCG/ACTUATION AEROSOL INHALER
2.0000 | INHALATION_SPRAY | Freq: Two times a day (BID) | RESPIRATORY_TRACT | 11 refills | Status: DC
Start: 2023-03-10 — End: 2024-04-05
  Filled 2023-03-10: qty 10.2, 30d supply, fill #0

## 2023-03-10 NOTE — Nursing Note (Signed)
Is the patient currently on oxygen therapy? Yes  What Liter Flow is the patient on?  2 l  What DME company does the patient utilize for oxygen therapy?  none  What is the patient on? n/a  What DME company does the patient utilize for CPAP/BiPAP/Trilogy? N/A  Smoking History:    Social History     Tobacco Use   Smoking Status Former    Average packs/day: 1.3 packs/day for 40.0 years (50.0 ttl pk-yrs)    Types: Cigarettes    Start date: 1975   Smokeless Tobacco Never      Have you received a flu shot? No  Have you received a pneumonia shot? No  Has the patient had any tests performed since the last visit? CT  If so, where were the test(s) performed? [facility]  Has the patient had any recent hospitalizations? No  Does the patient have any other associated symptoms or modifying factors? Shortness of breath, Wheezing, and Coughing
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
## 2023-03-25 ENCOUNTER — Other Ambulatory Visit: Payer: Self-pay

## 2023-03-30 ENCOUNTER — Other Ambulatory Visit: Payer: Self-pay

## 2023-03-30 ENCOUNTER — Other Ambulatory Visit: Payer: Medicare PPO | Attending: FAMILY MEDICINE

## 2023-03-30 DIAGNOSIS — N289 Disorder of kidney and ureter, unspecified: Secondary | ICD-10-CM | POA: Insufficient documentation

## 2023-03-30 LAB — BASIC METABOLIC PANEL
ANION GAP: 8 mmol/L (ref 4–13)
BUN/CREA RATIO: 24 — ABNORMAL HIGH (ref 6–22)
BUN: 21 mg/dL (ref 8–25)
CALCIUM: 9.8 mg/dL (ref 8.6–10.3)
CHLORIDE: 110 mmol/L (ref 96–111)
CO2 TOTAL: 27 mmol/L (ref 23–31)
CREATININE: 0.89 mg/dL (ref 0.60–1.05)
ESTIMATED GFR - FEMALE: 73 mL/min/BSA (ref >=60)
GLUCOSE: 112 mg/dL (ref 65–125)
POTASSIUM: 4.2 mmol/L (ref 3.5–5.1)
SODIUM: 145 mmol/L (ref 136–145)

## 2023-04-10 ENCOUNTER — Other Ambulatory Visit (INDEPENDENT_AMBULATORY_CARE_PROVIDER_SITE_OTHER): Payer: Self-pay | Admitting: FAMILY MEDICINE

## 2023-04-10 DIAGNOSIS — K219 Gastro-esophageal reflux disease without esophagitis: Secondary | ICD-10-CM

## 2023-04-10 DIAGNOSIS — E1165 Type 2 diabetes mellitus with hyperglycemia: Secondary | ICD-10-CM

## 2023-04-10 DIAGNOSIS — E782 Mixed hyperlipidemia: Secondary | ICD-10-CM

## 2023-04-20 ENCOUNTER — Other Ambulatory Visit: Payer: Self-pay

## 2023-04-20 ENCOUNTER — Ambulatory Visit: Payer: Medicare PPO | Attending: PODIATRIST-FOOT AND ANKLE SURGERY | Admitting: PODIATRIST-FOOT AND ANKLE SURGERY

## 2023-04-20 VITALS — HR 84 | Temp 97.2°F | Resp 20 | Ht 62.0 in | Wt 203.0 lb

## 2023-04-20 DIAGNOSIS — B351 Tinea unguium: Secondary | ICD-10-CM | POA: Insufficient documentation

## 2023-04-20 DIAGNOSIS — Z7985 Long-term (current) use of injectable non-insulin antidiabetic drugs: Secondary | ICD-10-CM | POA: Insufficient documentation

## 2023-04-20 DIAGNOSIS — Z7984 Long term (current) use of oral hypoglycemic drugs: Secondary | ICD-10-CM | POA: Insufficient documentation

## 2023-04-20 DIAGNOSIS — E1142 Type 2 diabetes mellitus with diabetic polyneuropathy: Secondary | ICD-10-CM | POA: Insufficient documentation

## 2023-05-01 DIAGNOSIS — B351 Tinea unguium: Secondary | ICD-10-CM | POA: Insufficient documentation

## 2023-05-01 DIAGNOSIS — E1142 Type 2 diabetes mellitus with diabetic polyneuropathy: Secondary | ICD-10-CM | POA: Insufficient documentation

## 2023-05-01 NOTE — Progress Notes (Signed)
PODIATRY, [facility]  [address]  [place] [address]  Operated by [facility]  H&P    Name: Angela Frey MRN:  [id number]   Date: 04/20/2023 DOB:  [birth year] (62 y.o.)             Chief Complaint: Nail Problem (Patient presents to the office for bilateral feet peeling and an issue with her nails. Patient was diagnosed in the past by Dr. Luetta Nutting to have onychomycosis. )  History of present illness  62 year old female presenting to clinic initial evaluation management diabetic foot care.  She reports sporadic numbness burning tingling in the digits bilateral.  States her toenails are discolored and she was failed multiple treatments for this    Review of systems:  Denies fever, chills, nausea, vomiting, shortness for breath, chest pain, calf pain     Objective   Objective :  Pulse 84   Temp 36.2 C (97.2 F)   Resp 20   Ht 1.575 m (5\' 2" )   Wt 92.1 kg (203 lb)   LMP  (LMP Unknown) Comment: stopped having periods at aget 44-46  SpO2 95%   BMI 37.13 kg/m       General:  Read and argumentative with myself and with all office staff   Dermatological:  No open wounds or interdigital maceration.  Appropriate temperature texture and turgor.  Multiple nails bilateral thick incurvated discolored with subungual debris tenderness to touch  Vascular:  2/4 DP and PT pulses.  Brisk refill time to the digits.  No edema.  No varicosities  Neurological: Light touch sensation intact L4 through S1.  0/10 protective sensation score bilateral foot tested with Semmes-Weinstein monofilament  Musculoskeletal:  5/5 FHL/EHL/FHB/EHB/FDL/EDL/FDB/EDB/TA/PT/Peroneal strength bilateral.  Restricted ankle joint dorsiflexion bilateral with the knee extended slightly improves with the knees flexed.    Gait: Stable fluid symmetric no antalgia  Data reviewed:    Current Outpatient Medications   Medication Sig    albuterol sulfate (PROVENTIL OR VENTOLIN OR PROAIR) 90 mcg/actuation Inhalation oral inhaler Take  1-2 Puffs by inhalation Every 4 hours as needed    amitriptyline (ELAVIL) 25 mg Oral Tablet Take 1 Tablet (25 mg total) by mouth Every night    aspirin 81 mg Oral Tablet, Chewable Chew 1 Tablet (81 mg total) Once a day    baclofen (LIORESAL) 10 mg Oral Tablet Take 1 tablet by mouth 4 times daily    Blood Sugar Diagnostic (ACCU-CHEK GUIDE TEST STRIPS) Strip 1 Strip by Does not apply route Four times a day    Blood-Glucose Meter Misc 4 times per day    budesonide-formoteroL (SYMBICORT) 160-4.5 mcg/actuation Inhalation oral inhaler Take 2 Puffs by inhalation Twice daily    calcium citrate 250 mg calcium Oral Tablet Take by mouth    cholecalciferol, Vitamin D3, 125 mcg (5,000 unit) Oral Tablet Take by oral route.    colchicine 0.6 mg Oral Tablet 2 tabs PO x 1, then 1 tab PO 1 hour later    cyanocobalamin (VITAMIN B 12) 1,000 mcg Oral Tablet Take 1 Tablet (1,000 mcg total) by mouth Once a day    esomeprazole magnesium (NEXIUM) 40 mg Oral Capsule, Delayed Release(E.C.) TAKE 1 CAPSULE BY MOUTH IN THE MORNING BEFORE BREAKFAST    fluorouraciL (EFUDEX) 5 % Cream Apply topically    gabapentin (NEURONTIN) 600 mg Oral Tablet Take 1 Tablet (600 mg total) by mouth Three times a day    hydroCHLOROthiazide (HYDRODIURIL) 25 mg Oral Tablet Take 1 Tablet (  25 mg total) by mouth Once a day    ketoconazole (NIZORAL) 2 % Shampoo USE 1/2 TO 1 (ONE-HALF TO ONE) OUNCE OF SHAMPOO TOPICALLY THREE TIMES A WEEK    Lactobac no.41/Bifidobact no.7 (PROBIOTIC-10 ORAL) Take by mouth Once a day    Lancets (ACCU-CHEK SOFTCLIX LANCETS) Misc 1 Each by Does not apply route Four times a day    levothyroxine (SYNTHROID) 175 mcg Oral Tablet Take 1 Tablet (175 mcg total) by mouth Every morning    lisinopriL (PRINIVIL) 20 mg Oral Tablet Take 1 tablet by mouth twice daily    lisinopriL (PRINIVIL) 20 mg Oral Tablet Take 1 tablet by mouth twice daily    meloxicam (MOBIC) 15 mg Oral Tablet Take 1 Tablet (15 mg total) by mouth Once per day as needed for Pain     metFORMIN (GLUCOPHAGE) 500 mg Oral Tablet Take 1 tablet by mouth twice daily with food    nystatin (MYCOSTATIN) 100,000 unit/gram Cream Apply 0.5 g t.i.d. to affected area.    nystatin (NYSTOP) 100,000 unit/gram Powder by Apply Topically route Three times a day as needed    pediatric multivitamins Oral Tablet, Chewable Chew 1 Tablet Once a day    pravastatin (PRAVACHOL) 10 mg Oral Tablet TAKE 1 TABLET BY MOUTH ONCE DAILY IN THE EVENING    semaglutide (RYBELSUS) 3 mg Oral Tablet Take 1 Tablet (3 mg total) by mouth    sertraline (ZOLOFT) 100 mg Oral Tablet Take 1 Tablet (100 mg total) by mouth Once a day    solifenacin (VESICARE) 10 mg Oral Tablet Take 1 Tablet (10 mg total) by mouth Once a day    tirzepatide (MOUNJARO) 5 mg/0.5 mL Subcutaneous Pen Injector Inject 0.5 mL (5 mg total) under the skin Every 7 days    tolterodine (DETROL LA) 4 mg Oral Capsule, Sust. Release 24 hr Take 1 Capsule (4 mg total) by mouth Once a day    triamcinolone acetonide 0.1 % Cream Apply topically        Assessment/Plan  Problem List Items Addressed This Visit          Dermatology    Onychomycosis       Neurologic    Diabetic polyneuropathy associated with type 2 diabetes mellitus (CMS HCC) - Primary     A thorough discussion was held with the patient regarding his condition.  Discussed with him etiology of his condition at length.  Discussed importance of strict blood sugar control follow up with primary care physician and/or endocrinology.  Diabetic foot education techniques were discussed to reduce pressure on the skin and prevent ulcer formation.  Recommend close follow up with different podiatrist gave several names for referral follow up here as needed    Laury Axon, MD

## 2023-05-03 ENCOUNTER — Other Ambulatory Visit (INDEPENDENT_AMBULATORY_CARE_PROVIDER_SITE_OTHER): Payer: Self-pay | Admitting: FAMILY MEDICINE

## 2023-05-03 DIAGNOSIS — F321 Major depressive disorder, single episode, moderate: Secondary | ICD-10-CM

## 2023-05-03 DIAGNOSIS — M62838 Other muscle spasm: Secondary | ICD-10-CM

## 2023-05-04 ENCOUNTER — Telehealth (INDEPENDENT_AMBULATORY_CARE_PROVIDER_SITE_OTHER): Payer: Self-pay | Admitting: FAMILY MEDICINE

## 2023-05-04 ENCOUNTER — Other Ambulatory Visit (INDEPENDENT_AMBULATORY_CARE_PROVIDER_SITE_OTHER): Payer: Self-pay | Admitting: FAMILY MEDICINE

## 2023-05-04 DIAGNOSIS — Z1231 Encounter for screening mammogram for malignant neoplasm of breast: Secondary | ICD-10-CM

## 2023-05-04 NOTE — Telephone Encounter (Signed)
Pt states that she needs an order to have a Mammogram. [facility] states that it is not due until Oct 2025.     She states that she thinks that she is allergic to [facility]. She wants to know if she is what is it doing to her body. I told her that if she thinks that she is allergic to it she shouldn't be taking it, She said that is not what she asked. She stated that surely she can ask and question and someone will call her back.    Sharen Hones, CMA

## 2023-05-18 ENCOUNTER — Other Ambulatory Visit: Payer: Self-pay

## 2023-05-18 ENCOUNTER — Inpatient Hospital Stay
Admission: RE | Admit: 2023-05-18 | Discharge: 2023-05-18 | Disposition: A | Discharge status: Home / Self Care | Payer: Medicare PPO | Source: Ambulatory Visit | Attending: FAMILY MEDICINE | Admitting: FAMILY MEDICINE

## 2023-05-18 DIAGNOSIS — Z1231 Encounter for screening mammogram for malignant neoplasm of breast: Secondary | ICD-10-CM | POA: Insufficient documentation

## 2023-05-28 ENCOUNTER — Other Ambulatory Visit (INDEPENDENT_AMBULATORY_CARE_PROVIDER_SITE_OTHER): Payer: Self-pay | Admitting: FAMILY MEDICINE

## 2023-05-28 DIAGNOSIS — E063 Autoimmune thyroiditis: Secondary | ICD-10-CM

## 2023-06-01 ENCOUNTER — Other Ambulatory Visit: Payer: Self-pay

## 2023-06-01 ENCOUNTER — Ambulatory Visit (INDEPENDENT_AMBULATORY_CARE_PROVIDER_SITE_OTHER): Payer: Self-pay | Admitting: FAMILY MEDICINE

## 2023-06-01 ENCOUNTER — Ambulatory Visit: Payer: Medicare PPO | Attending: FAMILY MEDICINE | Admitting: FAMILY MEDICINE

## 2023-06-01 ENCOUNTER — Encounter (INDEPENDENT_AMBULATORY_CARE_PROVIDER_SITE_OTHER): Payer: Self-pay | Admitting: FAMILY MEDICINE

## 2023-06-01 VITALS — BP 116/68 | HR 87 | Temp 97.3°F | Ht 62.0 in | Wt 196.2 lb

## 2023-06-01 DIAGNOSIS — Z87891 Personal history of nicotine dependence: Secondary | ICD-10-CM | POA: Insufficient documentation

## 2023-06-01 DIAGNOSIS — Z7984 Long term (current) use of oral hypoglycemic drugs: Secondary | ICD-10-CM | POA: Insufficient documentation

## 2023-06-01 DIAGNOSIS — Z0000 Encounter for general adult medical examination without abnormal findings: Secondary | ICD-10-CM | POA: Insufficient documentation

## 2023-06-01 DIAGNOSIS — I10 Essential (primary) hypertension: Secondary | ICD-10-CM | POA: Insufficient documentation

## 2023-06-01 DIAGNOSIS — Z6835 Body mass index (BMI) 35.0-35.9, adult: Secondary | ICD-10-CM | POA: Insufficient documentation

## 2023-06-01 DIAGNOSIS — Z1211 Encounter for screening for malignant neoplasm of colon: Secondary | ICD-10-CM | POA: Insufficient documentation

## 2023-06-01 DIAGNOSIS — Z0001 Encounter for general adult medical examination with abnormal findings: Secondary | ICD-10-CM | POA: Insufficient documentation

## 2023-06-01 DIAGNOSIS — E1165 Type 2 diabetes mellitus with hyperglycemia: Secondary | ICD-10-CM | POA: Insufficient documentation

## 2023-06-01 DIAGNOSIS — G629 Polyneuropathy, unspecified: Secondary | ICD-10-CM | POA: Insufficient documentation

## 2023-06-01 DIAGNOSIS — M25552 Pain in left hip: Secondary | ICD-10-CM | POA: Insufficient documentation

## 2023-06-01 DIAGNOSIS — F321 Major depressive disorder, single episode, moderate: Secondary | ICD-10-CM | POA: Insufficient documentation

## 2023-06-01 DIAGNOSIS — E063 Autoimmune thyroiditis: Secondary | ICD-10-CM | POA: Insufficient documentation

## 2023-06-01 DIAGNOSIS — E1140 Type 2 diabetes mellitus with diabetic neuropathy, unspecified: Secondary | ICD-10-CM | POA: Insufficient documentation

## 2023-06-01 DIAGNOSIS — Z5320 Procedure and treatment not carried out because of patient's decision for unspecified reasons: Secondary | ICD-10-CM | POA: Insufficient documentation

## 2023-06-01 DIAGNOSIS — E119 Type 2 diabetes mellitus without complications: Secondary | ICD-10-CM | POA: Insufficient documentation

## 2023-06-01 DIAGNOSIS — I700 Atherosclerosis of aorta: Secondary | ICD-10-CM | POA: Insufficient documentation

## 2023-06-01 DIAGNOSIS — Z23 Encounter for immunization: Secondary | ICD-10-CM | POA: Insufficient documentation

## 2023-06-01 DIAGNOSIS — M62838 Other muscle spasm: Secondary | ICD-10-CM | POA: Insufficient documentation

## 2023-06-01 DIAGNOSIS — Z1159 Encounter for screening for other viral diseases: Secondary | ICD-10-CM | POA: Insufficient documentation

## 2023-06-01 DIAGNOSIS — K219 Gastro-esophageal reflux disease without esophagitis: Secondary | ICD-10-CM | POA: Insufficient documentation

## 2023-06-01 DIAGNOSIS — Z7985 Long-term (current) use of injectable non-insulin antidiabetic drugs: Secondary | ICD-10-CM | POA: Insufficient documentation

## 2023-06-01 DIAGNOSIS — E669 Obesity, unspecified: Secondary | ICD-10-CM | POA: Insufficient documentation

## 2023-06-01 DIAGNOSIS — M792 Neuralgia and neuritis, unspecified: Secondary | ICD-10-CM | POA: Insufficient documentation

## 2023-06-01 DIAGNOSIS — L659 Nonscarring hair loss, unspecified: Secondary | ICD-10-CM | POA: Insufficient documentation

## 2023-06-01 DIAGNOSIS — F5101 Primary insomnia: Secondary | ICD-10-CM | POA: Insufficient documentation

## 2023-06-01 DIAGNOSIS — L219 Seborrheic dermatitis, unspecified: Secondary | ICD-10-CM | POA: Insufficient documentation

## 2023-06-01 DIAGNOSIS — E782 Mixed hyperlipidemia: Secondary | ICD-10-CM | POA: Insufficient documentation

## 2023-06-01 DIAGNOSIS — K7460 Unspecified cirrhosis of liver: Secondary | ICD-10-CM | POA: Insufficient documentation

## 2023-06-01 DIAGNOSIS — M545 Low back pain, unspecified: Secondary | ICD-10-CM | POA: Insufficient documentation

## 2023-06-01 MED ORDER — SERTRALINE 100 MG TABLET
100.0000 mg | ORAL_TABLET | Freq: Every day | ORAL | 1 refills | Status: DC
Start: 2023-06-01 — End: 2023-11-24

## 2023-06-01 MED ORDER — HYDROCHLOROTHIAZIDE 25 MG TABLET
25.0000 mg | ORAL_TABLET | Freq: Every day | ORAL | 1 refills | Status: DC
Start: 2023-06-01 — End: 2023-11-24

## 2023-06-01 MED ORDER — PRAVASTATIN 10 MG TABLET
10.0000 mg | ORAL_TABLET | Freq: Every evening | ORAL | 1 refills | Status: DC
Start: 2023-06-01 — End: 2023-11-24

## 2023-06-01 MED ORDER — LEVOTHYROXINE 175 MCG TABLET
175.0000 ug | ORAL_TABLET | Freq: Every morning | ORAL | 1 refills | Status: DC
Start: 2023-06-01 — End: 2023-11-24

## 2023-06-01 MED ORDER — GABAPENTIN 600 MG TABLET
600.0000 mg | ORAL_TABLET | Freq: Three times a day (TID) | ORAL | 1 refills | Status: DC
Start: 2023-06-01 — End: 2023-12-07

## 2023-06-01 MED ORDER — MOUNJARO 7.5 MG/0.5 ML SUBCUTANEOUS PEN INJECTOR
7.5000 mg | PEN_INJECTOR | SUBCUTANEOUS | 0 refills | Status: DC
Start: 2023-06-01 — End: 2023-07-04

## 2023-06-01 MED ORDER — KETOCONAZOLE 2 % SHAMPOO
MEDICATED_SHAMPOO | CUTANEOUS | 0 refills | Status: DC
Start: 2023-06-01 — End: 2024-02-06

## 2023-06-01 MED ORDER — ESOMEPRAZOLE MAGNESIUM 40 MG CAPSULE,DELAYED RELEASE
DELAYED_RELEASE_CAPSULE | ORAL | 1 refills | Status: DC
Start: 2023-06-01 — End: 2023-09-09

## 2023-06-01 MED ORDER — MELOXICAM 15 MG TABLET
15.0000 mg | ORAL_TABLET | Freq: Every day | ORAL | 1 refills | Status: DC | PRN
Start: 2023-06-01 — End: 2023-11-24

## 2023-06-01 MED ORDER — BACLOFEN 10 MG TABLET
10.0000 mg | ORAL_TABLET | Freq: Four times a day (QID) | ORAL | 1 refills | Status: DC
Start: 2023-06-01 — End: 2023-08-10

## 2023-06-01 MED ORDER — ACCU-CHEK GUIDE TEST STRIPS
1.0000 | ORAL_STRIP | Freq: Four times a day (QID) | 11 refills | Status: DC
Start: 2023-06-01 — End: 2023-11-24

## 2023-06-01 MED ORDER — LANCETS
1.0000 | Freq: Four times a day (QID) | 11 refills | Status: DC
Start: 2023-06-01 — End: 2023-11-24

## 2023-06-01 MED ORDER — METFORMIN 500 MG TABLET
500.0000 mg | ORAL_TABLET | Freq: Two times a day (BID) | ORAL | 1 refills | Status: DC
Start: 2023-06-01 — End: 2023-08-10

## 2023-06-01 MED ORDER — LISINOPRIL 40 MG TABLET
40.0000 mg | ORAL_TABLET | Freq: Every day | ORAL | 1 refills | Status: DC
Start: 2023-06-01 — End: 2023-11-24

## 2023-06-01 NOTE — Progress Notes (Signed)
FAMILY MEDICINE, MEDICAL OFFICE BUILDING [address]  [address]  [place] [address]  Operated by [facility]  Medicare Annual Wellness Visit    Name: Angela Frey MRN:  [id number]   Date: 06/01/2023 Age: 62 y.o.     SUBJECTIVE:   Angela Frey is a 62 y.o. female for presenting for Medicare Wellness exam.   I have reviewed and reconciled the medication list with the patient today.    She says had test done [place] and something is wrong with her spleen.   She is interested in Hormone replacement advise patient I can refer her to Dr. Haynes Bast to discuss hormone replacement.  She has lost 7 additional lbs. She is still eating [employer] chips and she is going to stop.   She is great grandmother now. Little boy.   She reports her spleen is enlarged I advised her I will get report from [facility].  She wants to increase the dose of Mounjaro she reports her blood sugars are doing better she is tolerating the medication well.  She reports no side effects.  She reports she has lost some weight which she is very happy about that.  She has difficulty due to arthritis in her chronic medical conditions.  She takes her medication as directed.  She does not exercise.  She drives and is able to get her medicines and go places if she needs.  She has help in general if she needs it.          06/01/2023     9:00 AM 03/02/2022     2:32 PM 10/14/2020     3:00 PM   Comprehensive Health Assessment-Adult   Do you wish to complete this form? Yes No Yes   During the past 4 weeks, how would you rate your health in general? Fair Good Fair   During the past 4 weeks, how much difficulty have you had doing your usual activities inside and outside your home because of medical or emotional problems? A little bit of difficulty Some difficulty Much difficulty   During the past 4 weeks, was someone available to help you if you needed and wanted help? No No No   In the past year, how many times have you gone to  the emergency department or been admitted to a hospital for a health problem? None None None   Are you generally satisfied with your sleep? Yes No No   Do you have enough money to buy things you need in everyday life, such as food, clothing, medicines, and housing? Sometimes Yes, always Sometimes   Can you get to places beyond walking distance without help?  (For example, can you drive your own car or travel alone on buses)? Yes Yes Yes   Do you fasten your seatbelt when you are in a car? Yes, usually Yes, usually Yes, usually   Do you exercise 20 minutes 3 or more days per week (such as walking, dancing, biking, mowing grass, swimming)? No, I usually don't exercise this much No, I usually don't exercise this much No, I usually don't exercise this much   How often do you eat food that is healthy (fruits, vegetables, lean meats) instead of unhealthy (sweets, fast food, junk food, fatty foods)? Some of the time Some of the time A little bit of the time   Have your parents, brothers or sisters had any of the following problems before the age of 62? (check all that apply) Heart  problems, or hardening of the arteries;Diabetes (sugar);High cholesterol;Mental health problems such as depression, bipolar, severe anxiety, postpartum depression Diabetes (sugar);High cholesterol    How often do you have trouble taking medicines the eay you are told to take them? I always take them as prescribed I always take them as prescribed --        NA pt states it make no sense wont answer   Do you need any help communicating with your doctors and nurses because of vision or hearing problems? No No No   During the past 12 months, have you experienced confusion or memory loss that is happening more often or is getting worse? No No No   Do you have one person you think of as your personal doctor (primary care provider or family doctor)? Yes Yes    If you are seeing a Primary Care Provider (PCP) or family doctor. please list their name Dr  Wonda Olds Dr. Wonda Olds    Are you now also seeing any specialist physician(s) (such as eye doctor, foot doctor, skin doctor)? No Yes Yes   If you are seeing a specialist for anything such as foot, eye, skin, etc.  please list their name(s)  urology Dr. Grandville Silos eye, skin   How confident are you that you can control or manage most of your health problems? Very confident Very confident Very confident       I have reviewed and updated as appropriate the past medical, family and social history. 06/01/2023 as summarized below:  Past Medical History:   Diagnosis Date    Asthma     Centrilobular emphysema (CMS HCC)     Chronic obstructive airway disease (CMS HCC)     Chronic respiratory failure with hypoxia (CMS HCC)     Hypertension     Hypothyroidism     OSA (obstructive sleep apnea)     SOB (shortness of breath)     Type 2 diabetes mellitus (CMS HCC)      Past Surgical History:   Procedure Laterality Date    Hx pelvic laparoscopy      Hx tonsillectomy      Hx tubal ligation      Leg surgery      Neck surgery      Tuboplasty / tubotubal anastomosis      Ulnar nerve repair Bilateral      Current Outpatient Medications   Medication Sig    albuterol sulfate (PROVENTIL OR VENTOLIN OR PROAIR) 90 mcg/actuation Inhalation oral inhaler Take 1-2 Puffs by inhalation Every 4 hours as needed    albuterol sulfate (PROVENTIL) 2.5 mg /3 mL (0.083 %) Inhalation nebulizer solution Take 3 mL (2.5 mg total) by nebulization Every 4 hours as needed for Wheezing    amitriptyline (ELAVIL) 25 mg Oral Tablet Take 1 Tablet (25 mg total) by mouth Every night    aspirin 81 mg Oral Tablet, Chewable Chew 1 Tablet (81 mg total) Once a day    baclofen (LIORESAL) 10 mg Oral Tablet Take 1 Tablet (10 mg total) by mouth Four times a day Indications: muscle spasms caused by a spinal disease    Blood Sugar Diagnostic (ACCU-CHEK GUIDE TEST STRIPS) Does not apply Strip 1 Strip Four times a day    Blood-Glucose Meter Misc 4 times per day    budesonide-formoteroL  (SYMBICORT) 160-4.5 mcg/actuation Inhalation oral inhaler Take 2 Puffs by inhalation Twice daily    calcium citrate 250 mg calcium Oral Tablet Take by mouth    cholecalciferol,  Vitamin D3, 125 mcg (5,000 unit) Oral Tablet Take by oral route.    cyanocobalamin (VITAMIN B 12) 1,000 mcg Oral Tablet Take 1 Tablet (1,000 mcg total) by mouth Once a day    esomeprazole magnesium (NEXIUM) 40 mg Oral Capsule, Delayed Release(E.C.) TAKE 1 CAPSULE BY MOUTH IN THE MORNING BEFORE BREAKFAST    fluorouraciL (EFUDEX) 5 % Cream Apply topically    gabapentin (NEURONTIN) 600 mg Oral Tablet Take 1 Tablet (600 mg total) by mouth Three times a day    hydroCHLOROthiazide (HYDRODIURIL) 25 mg Oral Tablet Take 1 Tablet (25 mg total) by mouth Once a day    ketoconazole (NIZORAL) 2 % Shampoo USE 1/2 TO 1 (ONE-HALF TO ONE) OUNCE OF SHAMPOO TOPICALLY THREE TIMES A WEEK    Lactobac no.41/Bifidobact no.7 (PROBIOTIC-10 ORAL) Take by mouth Once a day    Lancets (ACCU-CHEK SOFTCLIX LANCETS) Misc 1 Each Four times a day    levothyroxine (SYNTHROID) 175 mcg Oral Tablet Take 1 Tablet (175 mcg total) by mouth Every morning    lisinopriL (PRINIVIL) 20 mg Oral Tablet Take 1 tablet by mouth twice daily    lisinopriL (PRINIVIL) 40 mg Oral Tablet Take 1 Tablet (40 mg total) by mouth Once a day    meloxicam (MOBIC) 15 mg Oral Tablet Take 1 Tablet (15 mg total) by mouth Once per day as needed for Pain    metFORMIN (GLUCOPHAGE) 500 mg Oral Tablet Take 1 Tablet (500 mg total) by mouth Twice daily with food    nystatin (MYCOSTATIN) 100,000 unit/gram Cream Apply 0.5 g t.i.d. to affected area.    nystatin (NYSTOP) 100,000 unit/gram Powder by Apply Topically route Three times a day as needed    pediatric multivitamins Oral Tablet, Chewable Chew 1 Tablet Once a day    pravastatin (PRAVACHOL) 10 mg Oral Tablet Take 1 Tablet (10 mg total) by mouth Every evening    sertraline (ZOLOFT) 100 mg Oral Tablet Take 1 Tablet (100 mg total) by mouth Once a day    solifenacin  (VESICARE) 10 mg Oral Tablet Take 1 Tablet (10 mg total) by mouth Once a day    tirzepatide (MOUNJARO) 5 mg/0.5 mL Subcutaneous Pen Injector Inject 0.5 mL (5 mg total) under the skin Every 7 days    tirzepatide (MOUNJARO) 7.5 mg/0.5 mL Subcutaneous Pen Injector Inject 0.5 mL (7.5 mg total) under the skin Every 7 days    tolterodine (DETROL LA) 4 mg Oral Capsule, Sust. Release 24 hr Take 1 Capsule (4 mg total) by mouth Once a day    triamcinolone acetonide 0.1 % Cream Apply topically     Family Medical History:       Problem Relation (Age of Onset)    No Known Problems Mother, Father            Social History     Socioeconomic History    Marital status: Widowed   Tobacco Use    Smoking status: Former     Average packs/day: 1.3 packs/day for 40.0 years (50.0 ttl pk-yrs)     Types: Cigarettes     Start date: 1975    Smokeless tobacco: Never   Vaping Use    Vaping status: Never Used   Substance and Sexual Activity    Alcohol use: Never    Drug use: Never     Social Determinants of Health     Financial Resource Strain: Low Risk  (06/01/2023)    Financial Resource Strain     SDOH Financial:  No   Transportation Needs: Low Risk  (06/01/2023)    Transportation Needs     SDOH Transportation: No   Social Connections: Medium Risk (06/01/2023)    Social Connections     SDOH Social Isolation: 3 to 5 times a week   Intimate Partner Violence: Low Risk  (06/01/2023)    Intimate Partner Violence     SDOH Domestic Violence: No   Housing Stability: Low Risk  (06/01/2023)    Housing Stability     SDOH Housing Situation: I have housing.     SDOH Housing Worry: No   Health Literacy: Low Risk  (06/01/2023)    Health Literacy     SDOH Health Literacy: Never   Employment Status: Low Risk  (06/01/2023)    Employment Status     SDOH Employment: Otherwise unemployed but not seeking work (ex. Consulting civil engineer, retired, disabled, unpaid primary care giver)         List of Current Health Care Providers   Care Team       PCP       Name Type Specialty Phone  Number    Bing Plume, DO Physician FAMILY MEDICINE [phone]              Care Team       Name Type Specialty Phone Number    Carmie End, MD Physician PULMONARY DISEASE [phone]                      Health Maintenance   Topic Date Due    Hepatitis C screening  Never done    Pneumococcal Vaccine, Age 61-64 (1 of 2 - PCV) Never done    HIV Screening  Never done    Hepatitis A Vaccine (1 of 2 - Risk 2-dose series) Never done    Pap smear/HPV  Never done    Shingles Vaccine (1 of 2) Never done    Colonoscopy  Never done    Adult Tdap-Td (2 - Td or Tdap) 06/13/2022    Covid-19 Vaccine (4 - 2024-25 season) 03/13/2023    Diabetic A1C  07/10/2023    Diabetic Kidney Health Microalb/Cr Ratio  01/08/2024    Diabetic Retinal Exam  03/03/2024    Diabetic Kidney Health eGFR  03/29/2024    Osteoporosis screening  05/13/2024    Breast Cancer Screening  05/17/2025    Influenza Vaccine  Completed    Medicare Annual Wellness Visit - Calendar Year Insurers  Completed     Medicare Wellness Assessment   Medicare initial or wellness physical in the last year?: No  Advance Directives   Does patient have a living will or MPOA: No           Advance directive information given to the patient today?: Patient Declined      Activities of Daily Living   Do you need help with dressing, bathing, or walking?: No   Do you need help with shopping, housekeeping, medications, or finances?: No   Do you have rugs in hallways, broken steps, or poor lighting?: No   Do you have grab bars in your bathroom, non-slip strips in your tub, and hand rails on your stairs?: Yes   Cognitive Function Screen (1=Yes, 0=No)   What is you age?: Correct   What is the time to the nearest hour?: Correct   What is the year?: Correct   What is the name of this clinic?: Correct   Can the patient recognize two  persons (the doctor, the nurse, home help, etc.)?: Correct   What is the date of your birth? (day and month sufficient) : Correct   In what year  did World War II end?: Incorrect   Who is the current president of the [place]?: Correct   Count from 20 down to 1?: Correct   What address did I give you earlier?: Correct   Total Score: 9       Fall Risk Screen   Do you feel unsteady when standing or walking?: Yes  Do you worry about falling?: Yes  Have you fallen in the past year?: No   Depression Screen     Little interest or pleasure in doing things.: Not at all  Feeling down, depressed, or hopeless: Not at all  PHQ 2 Total: 0     Pain Score   Pain Score:   6    Substance Use-Abuse Screening     Tobacco Use     In Past 12 MONTHS, how often have you used any tobacco product (for example, cigarettes, e-cigarettes, cigars, pipes, or smokeless tobacco)?: Never     Alcohol use     In the PAST 12 MONTHS, how often have you had 5 (men)/4 (women) or more drinks containing alcohol in one day?: Never     Prescription Drug Use     In the PAST 12 months, how often have you used any prescription medications just for the feeling, more than prescribed, or that were not prescribed for you? Prescriptions may include: opioids, benzodiazepines, medications for ADHD: Never           Illicit Drug Use   In the PAST 12 MONTHS, how often have you used any drugs, including marijuana, cocaine or crack, heroin, methamphetamine, hallucinogens, ecstasy/MDMA?: Never                   OBJECTIVE:   BP 116/68   Pulse 87   Temp 36.3 C (97.3 F)   Ht 1.575 m (5\' 2" )   Wt 89 kg (196 lb 3.2 oz)   LMP  (LMP Unknown) Comment: stopped having periods at aget [age]  SpO2 95%   BMI 35.89 kg/m        Other appropriate exam:  Physical Exam  Constitutional:       Appearance: She is obese.   HENT:      Head: Normocephalic and atraumatic.      Right Ear: Tympanic membrane normal.      Left Ear: Tympanic membrane normal.      Nose: Nose normal.      Mouth/Throat:      Mouth: Mucous membranes are moist.   Eyes:      Conjunctiva/sclera: Conjunctivae normal.   Cardiovascular:      Rate and Rhythm:  Normal rate and regular rhythm.      Pulses: Normal pulses.      Heart sounds: Normal heart sounds.   Pulmonary:      Effort: Pulmonary effort is normal.      Breath sounds: Normal breath sounds.   Abdominal:      General: Bowel sounds are normal.      Palpations: Abdomen is soft.   Neurological:      General: No focal deficit present.      Mental Status: She is alert and oriented to person, place, and time.   Psychiatric:         Mood and Affect: Mood normal.  Behavior: Behavior normal.         Health Maintenance Due   Topic Date Due    Hepatitis C screening  Never done    Pneumococcal Vaccine, Age 77-64 (1 of 2 - PCV) Never done    HIV Screening  Never done    Hepatitis A Vaccine (1 of 2 - Risk 2-dose series) Never done    Pap smear/HPV  Never done    Shingles Vaccine (1 of 2) Never done    Colonoscopy  Never done    Adult Tdap-Td (2 - Td or Tdap) 06/13/2022    Covid-19 Vaccine (4 - 2024-25 season) 03/13/2023      ASSESSMENT & PLAN:    Identified Risk Factors/ Recommended Actions     ICD-10-CM    1. Hepatic cirrhosis, unspecified hepatic cirrhosis type, unspecified whether ascites present (CMS HCC)  K74.60 HEPATITIS C ANTIBODY SCREEN WITH REFLEX TO HCV PCR      2. Abdominal aortic atherosclerosis (CMS HCC)  I70.0       3. Diabetes mellitus (CMS HCC)  E11.9 tirzepatide (MOUNJARO) 7.5 mg/0.5 mL Subcutaneous Pen Injector     BASIC METABOLIC PANEL      4. Essential hypertension  I10 hydroCHLOROthiazide (HYDRODIURIL) 25 mg Oral Tablet     lisinopriL (PRINIVIL) 40 mg Oral Tablet      5. Gastroesophageal reflux disease without esophagitis  K21.9 esomeprazole magnesium (NEXIUM) 40 mg Oral Capsule, Delayed Release(E.C.)      6. Hypothyroidism due to Hashimoto's thyroiditis  E06.3 levothyroxine (SYNTHROID) 175 mcg Oral Tablet      7. Lumbar pain  M54.50 meloxicam (MOBIC) 15 mg Oral Tablet      8. Uncontrolled type 2 diabetes mellitus with hyperglycemia (CMS HCC)  E11.65 pravastatin (PRAVACHOL) 10 mg Oral Tablet      metFORMIN (GLUCOPHAGE) 500 mg Oral Tablet     Blood Sugar Diagnostic (ACCU-CHEK GUIDE TEST STRIPS) Does not apply Strip     Lancets (ACCU-CHEK SOFTCLIX LANCETS) Misc      9. Mixed hyperlipidemia  E78.2 pravastatin (PRAVACHOL) 10 mg Oral Tablet      10. Moderate major depression (CMS HCC)  F32.1 sertraline (ZOLOFT) 100 mg Oral Tablet      11. Muscle spasm  M62.838 baclofen (LIORESAL) 10 mg Oral Tablet      12. Neuropathic pain  M79.2       13. Primary insomnia  F51.01       14. Pain of left hip  M25.552 gabapentin (NEURONTIN) 600 mg Oral Tablet      15. Neuropathy (CMS HCC)  G62.9 gabapentin (NEURONTIN) 600 mg Oral Tablet      16. Seborrheic dermatitis of scalp  L21.9 ketoconazole (NIZORAL) 2 % Shampoo      17. Need for vaccination  Z23       18. Colon cancer screening  Z12.11       19. Need for hepatitis C screening test  Z11.59       20. Medicare annual wellness visit, subsequent  Z00.00       21. Need for shingles vaccine  Z23       22. Hair loss  L65.9 Refer to OB/GYN [facility] [place]      23. Colonoscopy refused  Z53.20              Fall Risk Follow up plan of care: Discussed optimizing home safety  The PHQ 2 Total: 0 depression screen is interpreted as negative.  Patient declined Advanced Directives information.        Orders Placed This Encounter    Flu Vaccine, 6 month-adult,0.5 mL IM (Admin)    BASIC METABOLIC PANEL    HEPATITIS C ANTIBODY SCREEN WITH REFLEX TO HCV PCR    Refer to OB/GYN [facility] [place]    hydroCHLOROthiazide (HYDRODIURIL) 25 mg Oral Tablet    esomeprazole magnesium (NEXIUM) 40 mg Oral Capsule, Delayed Release(E.C.)    levothyroxine (SYNTHROID) 175 mcg Oral Tablet    meloxicam (MOBIC) 15 mg Oral Tablet    pravastatin (PRAVACHOL) 10 mg Oral Tablet    sertraline (ZOLOFT) 100 mg Oral Tablet    baclofen (LIORESAL) 10 mg Oral Tablet    gabapentin (NEURONTIN) 600 mg Oral Tablet    ketoconazole (NIZORAL) 2 % Shampoo    metFORMIN (GLUCOPHAGE) 500 mg Oral Tablet     Blood Sugar Diagnostic (ACCU-CHEK GUIDE TEST STRIPS) Does not apply Strip    Lancets (ACCU-CHEK SOFTCLIX LANCETS) Misc    lisinopriL (PRINIVIL) 40 mg Oral Tablet    tirzepatide (MOUNJARO) 7.5 mg/0.5 mL Subcutaneous Pen Injector          The patient has been educated about risk factors and recommended preventive care. Written Prevention Plan completed/ updated and given to patient (see After Visit Summary).    Medicare wellness exam today   Order bmp will call with results in one month   Increase Mounjaro today will increase in one month if bmp ok   Flu shot today   Refill maintenance medications take as directed recheck next apt for control of symptoms  Encourage MPOA and living will   Continue all medication as directed     Depression screening is negative. Follow up plan of care:  recheck for symptoms of depression     BMI addressed: Advised on diet, weight loss, and exercise to reduce above normal BMI.    Fall Risk Follow up plan of care: Discussed optimizing home safety    A total of (39) minutes was spent on this patient encounter including review of historical information, examination, documentation and post visit activities.      The patient/care give was given ample opportunity to ask questions and those questions were answered to his/her satisfaction. A good faith effort was made to reconcile the patient's medications. The patient/caregiver was counseled on any appropriate vaccinations by the provider and questions were answered. The patient/care giver was told to contact me with any additional questions or concerns, or go to the ED in an emergency.     Return in about 3 months (around 09/01/2023) for DMII htn hld .    Marquinn Meschke K Geniyah Eischeid, DO

## 2023-06-01 NOTE — Patient Instructions (Addendum)
Vaccine Information Statement    Influenza (Flu) Vaccine (Inactivated or Recombinant): What You Need to Know    Many vaccine information statements are available in Spanish and other languages. See PromoAge.com.br.  Hojas de informacin sobre vacunas estn disponibles en espaol y en muchos otros idiomas. Visite PromoAge.com.br.    1. Why get vaccinated?    Influenza vaccine can prevent influenza (flu).    Flu is a contagious disease that spreads around the [place] every year, usually between October and May. Anyone can get the flu, but it is more dangerous for some people. Infants and young children, people 56 years and older, pregnant people, and people with certain health conditions or a weakened immune system are at greatest risk of flu complications.    Pneumonia, bronchitis, sinus infections, and ear infections are examples of flu-related complications. If you have a medical condition, such as heart disease, cancer, or diabetes, flu can make it worse.    Flu can cause fever and chills, sore throat, muscle aches, fatigue, cough, headache, and runny or stuffy nose. Some people may have vomiting and diarrhea, though this is more common in children than adults.     In an average year, thousands of people in the [place] die from flu, and many more are hospitalized. Flu vaccine prevents millions of illnesses and flu-related visits to the doctor each year.    2. Influenza vaccines     CDC recommends everyone 6 months and older get vaccinated every flu season. Children 6 months through 85 years of age may need 2 doses during a single flu season. Everyone else needs only 1 dose each flu season.    It takes about 2 weeks for protection to develop after vaccination.    There are many flu viruses, and they are always changing. Each year a new flu vaccine is made to protect against the influenza viruses believed to be likely to cause disease in the upcoming flu season. Even when the vaccine doesn't  exactly match these viruses, it may still provide some protection.     Influenza vaccine does not cause flu.    Influenza vaccine may be given at the same time as other vaccines.    3. Talk with your health care provider    Tell your vaccination provider if the person getting the vaccine:  Has had an allergic reaction after a previous dose of influenza vaccine, or has any severe, life-threatening allergies   Has ever had Guillain-Barr Syndrome (also called "GBS")    In some cases, your health care provider may decide to postpone influenza vaccination until a future visit.    Influenza vaccine can be administered at any time during pregnancy. People who are or will be pregnant during influenza season should receive inactivated influenza vaccine.    People with minor illnesses, such as a cold, may be vaccinated. People who are moderately or severely ill should usually wait until they recover before getting influenza vaccine.    Your health care provider can give you more information.    4. Risks of a vaccine reaction    Soreness, redness, and swelling where the shot is given, fever, muscle aches, and headache can happen after influenza vaccination.  There may be a very small increased risk of Guillain-Barr Syndrome (GBS) after inactivated influenza vaccine (the flu shot).    Young children who get the flu shot along with pneumococcal vaccine (PCV13) and/or DTaP vaccine at the same time might be slightly more likely to  have a seizure caused by fever. Tell your health care provider if a child who is getting flu vaccine has ever had a seizure.    People sometimes faint after medical procedures, including vaccination. Tell your provider if you feel dizzy or have vision changes or ringing in the ears.    As with any medicine, there is a very remote chance of a vaccine causing a severe allergic reaction, other serious injury, or death.    5. What if there is a serious problem?    An allergic reaction could occur after the  vaccinated person leaves the clinic. If you see signs of a severe allergic reaction (hives, swelling of the face and throat, difficulty breathing, a fast heartbeat, dizziness, or weakness), call 9-1-1 and get the person to the nearest hospital.    For other signs that concern you, call your health care provider.    Adverse reactions should be reported to the Vaccine Adverse Event Reporting System (VAERS). Your health care provider will usually file this report, or you can do it yourself. Visit the VAERS website at www.vaers.LAgents.no or call (860) 798-9774. VAERS is only for reporting reactions, and VAERS staff members do not give medical advice.    6. The National Vaccine Injury Compensation Program    The [employer] Vaccine Injury Compensation Program (VICP) is a federal program that was created to compensate people who may have been injured by certain vaccines. Claims regarding alleged injury or death due to vaccination have a time limit for filing, which may be as short as two years. Visit the VICP website at SpiritualWord.at or call (262)748-9696 to learn about the program and about filing a claim.     7. How can I learn more?    Ask your health care provider.   Call your local or state health department.   Visit the website of the Food and Drug Administration (FDA) for vaccine package inserts and additional information at FinderList.no.  Contact the Centers for Disease Control and Prevention (CDC):  Call 680-695-4745 (1-800-CDC-INFO) or  Visit CDC's influenza website at BiotechRoom.com.cy.    Vaccine Information Statement   Inactivated Influenza Vaccine   02/15/2020  42 U.S.C.  [id number]   Department of Health and Insurance risk surveyor for Disease Control and Prevention    Office Use Only    Medicare Preventive Services  Medicare coverage information Recommendation for YOU   Heart Disease and Diabetes   Lipid profile Every 5 years or more often if at risk for  cardiovascular disease     Lab Results   Component Value Date    CHOLESTEROL 151 01/08/2023    HDLCHOL 42 (L) 01/08/2023    LDLCHOL 86 01/08/2023    TRIG 126 01/08/2023           Diabetes Screening    Yearly for those at risk for diabetes, 2 tests per year for those with prediabetes Last Glucose: 144    Diabetes Self Management Training or Medical Nutrition Therapy  For those with diabetes, up to 10 hrs initial training within a year, subsequent years up to 2 hrs of follow up training Optional for those with diabetes     Medical Nutrition Therapy  Three hours of one-on-one counseling in first year, two hours in subsequent years Optional for those with diabetes, kidney disease   Intensive Behavioral Therapy for Obesity  Face-to-face counseling, first month every week, month 2-6 every other week, month 7-12 every month if continued progress is documented Optional for  those with Body Mass Index 30 or higher  Your Body mass index is 35.89 kg/m.   Tobacco Cessation (Quitting) Counseling   Covers up to 8 smoking and tobacco-use cessation counseling sessions in a 40-month period.    Optional for those that use tobacco   Cancer Screening Last Completion Date   Colorectal screening   For anyone age 68 to 14 or any age if high risk:  Screening Colonoscopy every 10 yrs if low risk,  more frequent if higher risk  OR  Cologuard Stool DNA test once every 3 years OR  Fecal Occult Blood Testing yearly OR  Flexible  Sigmoidoscopy  every 5 yr OR  CT Colonography every 5 yrs      See below for due date if applicable.   Screening Pap Test   Recommended every 3 years for all women age 75 to 35, or every five years if combined with HPV test (routine screening not needed after total hysterectomy).  Medicare covers every 2 years or yearly if high risk.  Screening Pelvic Exam   Medicare covers every 2 years, yearly if high risk or childbearing age with abnormal Pap in last 3 yrs.     See below for due date if applicable.   Screening  Mammogram   Recommended every 2 years for women age 59 to 60, or more frequent if you have a higher risk. Selectively recommended for women between 40-49 based on shared decisions about risk. Covered by Medicare up to every year for women age 9 or older --05/18/2023  See below for due date if applicable.         Lung Cancer Screening  Annual low dose computed tomography (LDCT scan) is recommended for those age 22-80 who smoked 20 pack-years and are current smokers or quit smoking within past 15 years, after counseling by your doctor or nurse clinician about the possible benefits or harms.     See below for due date if applicable.   Vaccinations   Respiratory syncytial virus (RSV)  Age 72 years or older: Based on shared clinical decision-making with your provider.  Pneumococcal Vaccine  Recommended routinely age 73+ with one or two separate vaccines based on your risk. Recommended before age 60 if medical conditions with increased risk  Seasonal Influenza Vaccine  Once every flu season   Hepatitis B Vaccine  3 doses if risk (including anyone with diabetes or liver disease)  Shingles Vaccine  Two doses at age 56 or older  Diphtheria Tetanus Pertussis Vaccine  ONCE as adult, booster every 10 years     Immunization History   Administered Date(s) Administered   . Covid-19 Vaccine,Moderna,12 Years+ 08/05/2020   . Covid-19 Vaccine,Pfizer-BioNTech,Purple Top,83yrs+ 09/22/2019, 10/13/2019   . DIPTH,PERTUSSIS-ACEL,TETANUS >10 YRS OLD 06/13/2012   . Flu Family 05/28/2013   . High-Dose Influenza Vaccine, 65+ 06/01/2011   . Influenza Vaccine, 6 month-adult 06/05/2012, 05/21/2014, 05/28/2015, 05/17/2017, 05/14/2019     Shingles vaccine and Diphtheria Tetanus Pertussis vaccines are available at pharmacies or local health department without a prescription.   Other Preventative Screening  Last Completion Date   Bone Densitometry   Screening: All females ages 108 and older every 10 years if initial screening normal. Postmenopausal  women ages 47-64 need screening with one or more risk factor: previous fracture, parental hip fracture, current smoker, low body weight, excessive alcohol use, Rheumatoid Arthritis   For women with diagnosed Osteoporosis, follow up is recommended every 2 years or a frequency recommended by your provider.     --  05/13/2022  See below for due date if applicable.     Glaucoma Screening   Yearly if in high risk group such as diabetes, family history, African American age 76+ or Hispanic American age 69+   See your eye care provider for screening.   Hepatitis C Screening   Recommended  for those born between ages 18-79 years.     See below for due date if applicable.     HIV Testing  Recommended routinely at least ONCE, covered every year for age 17 to 73 regardless of risk, and every year for age over 63 who ask for the test or higher risk. Yearly or up to 3 times in pregnancy         See below for due date if applicable.   Abdominal Aortic Aneurysm Screening Ultrasound   Once with a family history of abdominal aortic aneurysms OR a female between65-75 and have smoked at least 100 cigarettes in your lifetime.         See below for due date if applicable.       Your Personalized Schedule for Preventive Tests   Health Maintenance: Pending and Last Completed       Date Due Completion Date    Hepatitis C screening Never done ---    Pneumococcal Vaccine, Age 30-64 (1 of 2 - PCV) Never done ---    HIV Screening Never done ---    Hepatitis A Vaccine (1 of 2 - Risk 2-dose series) Never done ---    Pap smear/HPV Never done ---    Shingles Vaccine (1 of 2) Never done ---    Colonoscopy Never done ---    Adult Tdap-Td (2 - Td or Tdap) 06/13/2022 06/13/2012    Influenza Vaccine (1) 03/13/2023 05/14/2019    Covid-19 Vaccine (4 - 2024-25 season) 03/13/2023 08/05/2020    Diabetic A1C 07/10/2023 01/08/2023    Diabetic Kidney Health Microalb/Cr Ratio 01/08/2024 01/08/2023    Diabetic Retinal Exam 03/03/2024 03/04/2023    Diabetic Kidney Health  eGFR 03/29/2024 03/30/2023    Osteoporosis screening 05/13/2024 05/13/2022    Breast Cancer Screening 05/17/2025 05/18/2023                For Information on Advanced Directives for Health Care:  [place]:  [email or web address]  [place], [place], [place], [place] General Information: [email or web address]

## 2023-06-01 NOTE — Nursing Note (Signed)
Body mass index is 35.89 kg/m.    Fall Risk Assessment  Do you feel unsteady when standing or walking?: Yes  Do you worry about falling?: Yes  Have you fallen in the past year?: No      PHQ Questionnaire  Little interest or pleasure in doing things.: Not at all  Feeling down, depressed, or hopeless: Not at all  PHQ 2 Total: 0              09/03/2022     2:57 PM   GAD-7 Questionnaire   Feeling nervous,anxious,on edge 0   Not being able to stop or control worrying 2   Worrying too much about different things 2   Trouble relaxing 0   Being so restless that it is hard to sit still 0   Becoming easily annoyed or irritable 0   Feeling afraid as if something awful might happen 0   How difficult have these problems made it for you to work, take care of things at home, or get along with other people? Somewhat difficult   Gad-7 Score Total 4   Interpretation 0-4, normal         Review Flowsheet  More data exists         06/01/2023   FUNCTIONAL HEALTH SCREENING   Have you had a recent unexplained weight loss or gain? N   How often do you have a problem understanding what is told to you about your medical condition?  Never Never   Because we are aware of abuse and domestic violence today, we ask all patients: Are you being hurt, hit, or frightened by anyone at your home or in your life?  N   In the past year, have you been afraid of your partner or ex-partner? No   Do you feel physically safe and emotionally safe where you currently live? Yes   Do you have any basic needs within your home that are not being met? (such as Food, Shelter, Civil Service fast streamer, Tranportation, paying for bills and/or medications) N   What is your housing situation? Has Housing   Are you worried about losing your housing? No   What is your current work situation? Other unemp.   In the past year, have you or any family members you live with been unable to get any of the following when it was really needed?  No   Has lack of transportation kept you from medical  appointments, meetings, work, or from getting things needed for daily living?  No   How often do you see or talk to people that you care about and feel close to? (For example: talking to friends on the phone, visiting friends or family, going to church or club meetings) 3-5x a wk   Are any of these needs urgent? No   Do you have any advanced directives? No Advance   Would you like an advanced directive packet? Refused Packet      Details          Multiple values from one day are sorted in reverse-chronological order                Travel Screening       Question Response    Have you been in contact with someone who was sick? --    Do you have any of the following new or worsening symptoms? None of these    Have you traveled internationally in the last month? No  Travel History   Travel since 05/01/23    No documented travel since 05/01/23         Sharen Hones, CMA  06/01/2023, 09:09

## 2023-06-01 NOTE — Nursing Note (Signed)
06/01/23 0901   Medicare Wellness Assessment   Medicare initial or wellness physical in the last year? No   Advance Directives   Does patient have a living will or MPOA No   Advance directive information given to the patient today? Patient Declined   Activities of Daily Living   Do you need help with dressing, bathing, or walking? No   Do you need help with shopping, housekeeping, medications, or finances? No   Do you have rugs in hallways, broken steps, or poor lighting? No   Do you have grab bars in your bathroom, non-slip strips in your tub, and hand rails on your stairs? Yes   Cognitive Function Screen   What is you age? 1   What is the time to the nearest hour? 1   What is the year? 1   What is the name of this clinic? 1   Can the patient recognize two persons (the doctor, the nurse, home help, etc.)? 1   What is the date of your birth? (day and month sufficient)  1   In what year did World War II end? 0   Who is the current president of the [place]? 1   Count from 20 down to 1? 1   What address did I give you earlier? 1   Total Score 9   Depression Screen   Little interest or pleasure in doing things. 0   Feeling down, depressed, or hopeless 0   PHQ 2 Total 0   Pain Score   Pain Score SIX   Substance Use Screening   In Past 12 MONTHS, how often have you used any tobacco product (for example, cigarettes, e-cigarettes, cigars, pipes, or smokeless tobacco)? Never   In the PAST 12 MONTHS, how often have you had 5 (men)/4 (women) or more drinks containing alcohol in one day? Never   In the PAST 12 months, how often have you used any prescription medications just for the feeling, more than prescribed, or that were not prescribed for you? Prescriptions may include: opioids, benzodiazepines, medications for ADHD Never   In the PAST 12 MONTHS, how often have you used any drugs, including marijuana, cocaine or crack, heroin, methamphetamine, hallucinogens, ecstasy/MDMA? Never   Fall Risk Assessment   Do you  feel unsteady when standing or walking? Yes   Do you worry about falling? Yes   Have you fallen in the past year? No

## 2023-06-01 NOTE — Nursing Note (Signed)
06/01/23 0900   Comprehensive Health Assessment-Adult   Do you wish to complete this form? Yes   During the past 4 weeks, how would you rate your health in general? Fair   During the past 4 weeks, how much difficulty have you had doing your usual activities inside and outside your home because of medical or emotional problems? A little bit of difficulty   During the past 4 weeks, was someone available to help you if you needed and wanted help? No   In the past year, how many times have you gone to the emergency department or been admitted to a hospital for a health problem? None   Are you generally satisfied with your sleep? Yes   Do you have enough money to buy things you need in everyday life, such as food, clothing, medicines, and housing? Sometimes   Can you get to places beyond walking distance without help?  (For example, can you drive your own car or travel alone on buses)? Yes   Do you fasten your seatbelt when you are in a car? Yes, usually   Do you exercise 20 minutes 3 or more days per week (such as walking, dancing, biking, mowing grass, swimming)? No, I usually don't exercise this much   How often do you eat food that is healthy (fruits, vegetables, lean meats) instead of unhealthy (sweets, fast food, junk food, fatty foods)? Some of the time   Have your parents, brothers or sisters had any of the following problems before the age of 2? (check all that apply) Heart problems, or hardening of the arteries;Diabetes (sugar);High cholesterol;Mental health problems such as depression, bipolar, severe anxiety, postpartum depression   How often do you have trouble taking medicines the eay you are told to take them? I always take them as prescribed   Do you need any help communicating with your doctors and nurses because of vision or hearing problems? No   During the past 12 months, have you experienced confusion or memory loss that is happening more often or is getting worse? No   Do you have one person you  think of as your personal doctor (primary care provider or family doctor)? Yes   If you are seeing a Primary Care Provider (PCP) or family doctor. please list their name Dr Wonda Olds   Are you now also seeing any specialist physician(s) (such as eye doctor, foot doctor, skin doctor)? No   How confident are you that you can control or manage most of your health problems? Very confident

## 2023-06-01 NOTE — Nursing Note (Signed)
06/01/23 1610   Housing Stability   What is your housing situation? Has Housing   Are you worried about losing your housing? No   Health Education and Literacy   How often do you have a problem understanding what is told to you about your medical condition?  Never   Employment   What is your current work situation? Other unemp.   Financial   In the past year, have you or any family members you live with been unable to get any of the following when it was really needed?  No   Transportation   Has lack of transportation kept you from medical appointments, meetings, work, or from getting things needed for daily living?  No   Social Connections   How often do you see or talk to people that you care about and feel close to? (For example: talking to friends on the phone, visiting friends or family, going to church or club meetings) 3-5x a wk   Intimate Partner Violence   In the past year, have you been afraid of your partner or ex-partner? No   Do you feel physically safe and emotionally safe where you currently live? Yes   Help Needed   Would you like help with any of these needs? No   Are any of these needs urgent? No

## 2023-06-30 ENCOUNTER — Telehealth (INDEPENDENT_AMBULATORY_CARE_PROVIDER_SITE_OTHER): Payer: Self-pay | Admitting: FAMILY MEDICINE

## 2023-06-30 NOTE — Telephone Encounter (Signed)
Pt called in and stated that Dr Wonda Olds was suppose to send her in an order for urine to test her kidney's, she went to have test done and hospital told her there were no orders for urine in and they called our office and they were told that Dr Wonda Olds was not in the office that day. She stated that she was tired of this shit.  I talked to Dr Wonda Olds about said test and she stated that she didn't say that she would order that. I was looking in pt's chart as I was talking to her and I asked her if she was having trouble peeing or having pain and she said no. She stated Oh My God, like I was stupid. I told her I was looking at her chart and she asked what my question was to her! I told her that there was no mention of a urine test that only a BMP and a Hep C test was ordered. She stated that this is ridiculous. I explained that there was test in the BMP that tests your kidneys and she said Oh Okay. She stated that she will get this done tomorrow. She said that she hopes that we get the results because she needs a refill on her Joycelyn Man on Sunday.    Sharen Hones, CMA

## 2023-07-01 ENCOUNTER — Other Ambulatory Visit: Payer: Self-pay

## 2023-07-01 ENCOUNTER — Telehealth (INDEPENDENT_AMBULATORY_CARE_PROVIDER_SITE_OTHER): Payer: Self-pay | Admitting: FAMILY MEDICINE

## 2023-07-01 ENCOUNTER — Ambulatory Visit: Payer: Medicare PPO | Attending: FAMILY MEDICINE

## 2023-07-01 DIAGNOSIS — E119 Type 2 diabetes mellitus without complications: Secondary | ICD-10-CM | POA: Insufficient documentation

## 2023-07-01 DIAGNOSIS — K7460 Unspecified cirrhosis of liver: Secondary | ICD-10-CM | POA: Insufficient documentation

## 2023-07-01 LAB — BASIC METABOLIC PANEL
ANION GAP: 8 mmol/L (ref 4–13)
BUN/CREA RATIO: 24 — ABNORMAL HIGH (ref 6–22)
BUN: 20 mg/dL (ref 8–25)
CALCIUM: 10.2 mg/dL (ref 8.6–10.3)
CHLORIDE: 110 mmol/L (ref 96–111)
CO2 TOTAL: 24 mmol/L (ref 23–31)
CREATININE: 0.82 mg/dL (ref 0.60–1.05)
ESTIMATED GFR - FEMALE: 81 mL/min/BSA (ref >=60)
GLUCOSE: 103 mg/dL (ref 65–125)
POTASSIUM: 4.6 mmol/L (ref 3.5–5.1)
SODIUM: 142 mmol/L (ref 136–145)

## 2023-07-01 NOTE — Telephone Encounter (Signed)
Pt called in and wanted to let PCP and PCP nurse know that she completed your lab work and they advised Dr. Wonda Olds should have her lab results within the next hour and half. She said she wanted to make sure PCP knows that way she can check it before she leaves for vac[place]. Pt said that way Dr. Wonda Olds can send in her [facility] script with her dose increase before she leaves out of office.    Comments added by Abagail Kitchens on 07/01/23 at 14:34.

## 2023-07-01 NOTE — Telephone Encounter (Signed)
Pt called in again, stating that she wanted to let Dr. Wonda Olds know that her labs were done so she can send her meds in before she leaves.     Comments added by Abagail Kitchens on 07/01/23 at 16:05.

## 2023-07-03 ENCOUNTER — Other Ambulatory Visit (INDEPENDENT_AMBULATORY_CARE_PROVIDER_SITE_OTHER): Payer: Self-pay | Admitting: FAMILY MEDICINE

## 2023-07-03 ENCOUNTER — Encounter (INDEPENDENT_AMBULATORY_CARE_PROVIDER_SITE_OTHER): Payer: Self-pay | Admitting: FAMILY MEDICINE

## 2023-07-04 ENCOUNTER — Other Ambulatory Visit (INDEPENDENT_AMBULATORY_CARE_PROVIDER_SITE_OTHER): Payer: Self-pay | Admitting: FAMILY MEDICINE

## 2023-07-04 ENCOUNTER — Telehealth (INDEPENDENT_AMBULATORY_CARE_PROVIDER_SITE_OTHER): Payer: Self-pay | Admitting: FAMILY MEDICINE

## 2023-07-04 DIAGNOSIS — E119 Type 2 diabetes mellitus without complications: Secondary | ICD-10-CM

## 2023-07-04 LAB — HEPATITIS C ANTIBODY SCREEN WITH REFLEX TO HCV PCR: HCV ANTIBODY QUALITATIVE: NEGATIVE

## 2023-07-04 NOTE — Telephone Encounter (Signed)
Pt needs a refill on her Mounjaro. I didn't know if you changing the dosage or not.  Sent to [facility] in [place].    Sharen Hones, CMA

## 2023-07-04 NOTE — Telephone Encounter (Signed)
Pt stated she missed yesterday due to medication not being called in. Pt was upset

## 2023-07-05 ENCOUNTER — Encounter (INDEPENDENT_AMBULATORY_CARE_PROVIDER_SITE_OTHER): Payer: Self-pay | Admitting: FAMILY MEDICINE

## 2023-07-05 MED ORDER — MOUNJARO 7.5 MG/0.5 ML SUBCUTANEOUS PEN INJECTOR
7.5000 mg | PEN_INJECTOR | SUBCUTANEOUS | 0 refills | Status: DC
Start: 2023-07-05 — End: 2023-08-05

## 2023-07-05 NOTE — Telephone Encounter (Signed)
Patient called and said that she was due for her shot of Mounjaro this past Sunday (07-03-23). She asked me to send you a message about it.

## 2023-07-12 ENCOUNTER — Ambulatory Visit (HOSPITAL_COMMUNITY): Payer: Self-pay

## 2023-07-26 ENCOUNTER — Telehealth (INDEPENDENT_AMBULATORY_CARE_PROVIDER_SITE_OTHER): Payer: Self-pay | Admitting: FAMILY MEDICINE

## 2023-07-26 NOTE — Telephone Encounter (Signed)
Cyanna called in lvm requesting a phone call back from the nurse.    [name], [facility] [place]

## 2023-07-27 NOTE — Telephone Encounter (Signed)
I just called pt and was unable to leave a voicemail.    Sharen Hones, CMA

## 2023-08-05 ENCOUNTER — Telehealth (INDEPENDENT_AMBULATORY_CARE_PROVIDER_SITE_OTHER): Payer: Self-pay | Admitting: FAMILY MEDICINE

## 2023-08-05 ENCOUNTER — Other Ambulatory Visit (INDEPENDENT_AMBULATORY_CARE_PROVIDER_SITE_OTHER): Payer: Self-pay | Admitting: FAMILY MEDICINE

## 2023-08-05 DIAGNOSIS — E119 Type 2 diabetes mellitus without complications: Secondary | ICD-10-CM

## 2023-08-05 DIAGNOSIS — E1142 Type 2 diabetes mellitus with diabetic polyneuropathy: Secondary | ICD-10-CM

## 2023-08-05 MED ORDER — MOUNJARO 10 MG/0.5 ML SUBCUTANEOUS PEN INJECTOR
10.0000 mg | PEN_INJECTOR | SUBCUTANEOUS | 0 refills | Status: DC
Start: 2023-08-05 — End: 2023-09-01

## 2023-08-05 NOTE — Telephone Encounter (Signed)
Attempted to call pt to advise PCP sent med to pharmacy, but no answer, just rings.    Comments added by Abagail Kitchens on 08/05/23 at 17:08.

## 2023-08-05 NOTE — Telephone Encounter (Signed)
Pt called in and stated that she had been taking Mounjaro 7.5 mg. She stated that she had spoke with PCP and she was to do a dose increase to 10 mg. She said that she takes the shot every Sunday and would like provider to go ahead and send the script to pharmacy for the 10 mg so she can start the dose increase on 08/07/23. Pt advised me she had called the pharmacy to fill it and they advised her the 10 mg Mounjaro script was never sent in.   I placed pt on brief hold to review  pt encounters and meds, but pt hung up phone. Pt then called back and stated that I advised her I would call her back, I then explained that I placed her on a brief hold to research the issue. She then states that someone picked the phone up and hung up on her.   I advised her I would need to send PCP a message about the medication and give her a call back. She advised me she would like me to let PCP know that she needs her med and would like it to be sent in so she can take a dose on 08/07/23.   Pt asked for a return call back.    Comments added by Abagail Kitchens on 08/05/23 at 15:38.

## 2023-08-10 ENCOUNTER — Ambulatory Visit: Payer: Medicare PPO | Attending: FAMILY MEDICINE | Admitting: FAMILY MEDICINE

## 2023-08-10 ENCOUNTER — Ambulatory Visit (HOSPITAL_BASED_OUTPATIENT_CLINIC_OR_DEPARTMENT_OTHER)
Admission: RE | Admit: 2023-08-10 | Discharge: 2023-08-10 | Disposition: A | Discharge status: Home / Self Care | Payer: Medicare PPO | Source: Ambulatory Visit | Attending: FAMILY MEDICINE | Admitting: FAMILY MEDICINE

## 2023-08-10 ENCOUNTER — Encounter (INDEPENDENT_AMBULATORY_CARE_PROVIDER_SITE_OTHER): Payer: Self-pay | Admitting: FAMILY MEDICINE

## 2023-08-10 ENCOUNTER — Other Ambulatory Visit: Payer: Self-pay

## 2023-08-10 ENCOUNTER — Ambulatory Visit (HOSPITAL_COMMUNITY): Payer: Medicare PPO

## 2023-08-10 VITALS — BP 105/67 | HR 80 | Temp 97.5°F | Ht 62.0 in | Wt 191.4 lb

## 2023-08-10 DIAGNOSIS — G4733 Obstructive sleep apnea (adult) (pediatric): Secondary | ICD-10-CM | POA: Insufficient documentation

## 2023-08-10 DIAGNOSIS — M1612 Unilateral primary osteoarthritis, left hip: Secondary | ICD-10-CM | POA: Insufficient documentation

## 2023-08-10 DIAGNOSIS — L0390 Cellulitis, unspecified: Secondary | ICD-10-CM | POA: Insufficient documentation

## 2023-08-10 DIAGNOSIS — W260XXA Contact with knife, initial encounter: Secondary | ICD-10-CM | POA: Insufficient documentation

## 2023-08-10 DIAGNOSIS — F321 Major depressive disorder, single episode, moderate: Secondary | ICD-10-CM | POA: Insufficient documentation

## 2023-08-10 DIAGNOSIS — R011 Cardiac murmur, unspecified: Secondary | ICD-10-CM | POA: Insufficient documentation

## 2023-08-10 DIAGNOSIS — Z713 Dietary counseling and surveillance: Secondary | ICD-10-CM | POA: Insufficient documentation

## 2023-08-10 DIAGNOSIS — J432 Centrilobular emphysema: Secondary | ICD-10-CM | POA: Insufficient documentation

## 2023-08-10 DIAGNOSIS — E669 Obesity, unspecified: Secondary | ICD-10-CM | POA: Insufficient documentation

## 2023-08-10 DIAGNOSIS — E063 Autoimmune thyroiditis: Secondary | ICD-10-CM | POA: Insufficient documentation

## 2023-08-10 DIAGNOSIS — Z87891 Personal history of nicotine dependence: Secondary | ICD-10-CM | POA: Insufficient documentation

## 2023-08-10 DIAGNOSIS — G629 Polyneuropathy, unspecified: Secondary | ICD-10-CM | POA: Insufficient documentation

## 2023-08-10 DIAGNOSIS — Z7984 Long term (current) use of oral hypoglycemic drugs: Secondary | ICD-10-CM | POA: Insufficient documentation

## 2023-08-10 DIAGNOSIS — S61012A Laceration without foreign body of left thumb without damage to nail, initial encounter: Secondary | ICD-10-CM | POA: Insufficient documentation

## 2023-08-10 DIAGNOSIS — F5101 Primary insomnia: Secondary | ICD-10-CM | POA: Insufficient documentation

## 2023-08-10 DIAGNOSIS — E1142 Type 2 diabetes mellitus with diabetic polyneuropathy: Secondary | ICD-10-CM | POA: Insufficient documentation

## 2023-08-10 DIAGNOSIS — E1165 Type 2 diabetes mellitus with hyperglycemia: Secondary | ICD-10-CM

## 2023-08-10 DIAGNOSIS — J984 Other disorders of lung: Secondary | ICD-10-CM | POA: Insufficient documentation

## 2023-08-10 DIAGNOSIS — Z79890 Hormone replacement therapy (postmenopausal): Secondary | ICD-10-CM | POA: Insufficient documentation

## 2023-08-10 DIAGNOSIS — Z79899 Other long term (current) drug therapy: Secondary | ICD-10-CM | POA: Insufficient documentation

## 2023-08-10 DIAGNOSIS — I10 Essential (primary) hypertension: Secondary | ICD-10-CM | POA: Insufficient documentation

## 2023-08-10 DIAGNOSIS — Z7182 Exercise counseling: Secondary | ICD-10-CM | POA: Insufficient documentation

## 2023-08-10 DIAGNOSIS — M545 Low back pain, unspecified: Secondary | ICD-10-CM

## 2023-08-10 DIAGNOSIS — J4520 Mild intermittent asthma, uncomplicated: Secondary | ICD-10-CM | POA: Insufficient documentation

## 2023-08-10 DIAGNOSIS — I700 Atherosclerosis of aorta: Secondary | ICD-10-CM | POA: Insufficient documentation

## 2023-08-10 DIAGNOSIS — L03012 Cellulitis of left finger: Secondary | ICD-10-CM | POA: Insufficient documentation

## 2023-08-10 DIAGNOSIS — Z7985 Long-term (current) use of injectable non-insulin antidiabetic drugs: Secondary | ICD-10-CM | POA: Insufficient documentation

## 2023-08-10 DIAGNOSIS — K760 Fatty (change of) liver, not elsewhere classified: Secondary | ICD-10-CM | POA: Insufficient documentation

## 2023-08-10 DIAGNOSIS — Z6835 Body mass index (BMI) 35.0-35.9, adult: Secondary | ICD-10-CM | POA: Insufficient documentation

## 2023-08-10 DIAGNOSIS — E782 Mixed hyperlipidemia: Secondary | ICD-10-CM | POA: Insufficient documentation

## 2023-08-10 DIAGNOSIS — K219 Gastro-esophageal reflux disease without esophagitis: Secondary | ICD-10-CM | POA: Insufficient documentation

## 2023-08-10 DIAGNOSIS — M62838 Other muscle spasm: Secondary | ICD-10-CM | POA: Insufficient documentation

## 2023-08-10 DIAGNOSIS — K7460 Unspecified cirrhosis of liver: Secondary | ICD-10-CM | POA: Insufficient documentation

## 2023-08-10 LAB — HGA1C (HEMOGLOBIN A1C WITH EST AVG GLUCOSE)
ESTIMATED AVERAGE GLUCOSE: 97 mg/dL
HEMOGLOBIN A1C: 5 % (ref <=5.7)

## 2023-08-10 MED ORDER — DOXYCYCLINE HYCLATE 100 MG CAPSULE
100.0000 mg | ORAL_CAPSULE | Freq: Two times a day (BID) | ORAL | 0 refills | Status: AC
Start: 2023-08-10 — End: 2023-08-17

## 2023-08-10 MED ORDER — BACLOFEN 10 MG TABLET
10.0000 mg | ORAL_TABLET | Freq: Four times a day (QID) | ORAL | 1 refills | Status: AC
Start: 2023-08-10 — End: ?

## 2023-08-10 MED ORDER — METFORMIN 500 MG TABLET
500.0000 mg | ORAL_TABLET | Freq: Every morning | ORAL | 0 refills | Status: DC
Start: 2023-08-10 — End: 2023-10-01

## 2023-08-10 NOTE — Progress Notes (Signed)
 [facility], MED[facility] OFFICE BUILDING 5  [address]  [place] [address]      Operated by [facility]     Name: Angela Frey MRN:  [id number]   Date: 08/10/2023 Age: 63 y.o.      Chief Complaint   Patient presents with    Follow-up After Testing    Laceration     On left thumb    Sore     Pt has a sore on her right index finger, pt states that she busted it with a sterile needle and pus came out.    Medication Check     Pt wants to talk about coming off of the Metformin    Medication Refill     See pended       SUBJECTIVE    Patient is 63 y.o. she  presents today for follow-up on diabetes, hypertension, hyperlipidemia and GERD.  BS at home she says is fine around 123. She reports she is taking all her medication as directed and has been trying to eat better. She does not like many vegetables and she does not like many food it she is to be sweets.  Patient reports her blood pressure has been doing well at home.  She does not eat much vegetables she does not like a lot of foods.   She cut her left thumb with knife that she had been cutting meat with.   The  2nd digit on the right hand was sore then it got a bump and she busted it with a sterile needle and pus came out.   She has lost 5 lbs since last apt.   She has apt with Simmie Davies. She has fatty liver. March is her apt.   She saw a pulmonologist and would like to go over the CT scans all which is scanned to epic.  Discussed CT scan she has emphysema, fatty liver and cholelithiasis and slightly enlarged spleen could be pulmonary hypertension wall these were and she has an appointment with the GI doctor.  Advised her to keep glucose control lose weight and exercise will very beneficial and continue her metformin which is good for her liver and her diabetes.  She has a follow-up scheduled with the lung doctor.  She is of left hip pain hip x-ray in February which showed mild arthritis.  She reports that she has pain  from her back down her hip into her groin area on the left explained to patient this could be from her back she has not had an x-ray will mall since 2021 which did show moderate arthritis will go ahead and order x-ray today will call with results.  In the meantime use ice and heat rotating 20 minute on 20 minutes off as tolerated    ROS:      ROS - pertinent for presenting problem.  Systems otherwise negative than what has been noted.    PAST MEDICAL HISTORY  I have reviewed and updated as appropriate the past medical, surgical, family, and social history today:    Medical History/Surgical History/Family History/Social History  Past Medical History:   Diagnosis Date    Asthma     Centrilobular emphysema (CMS HCC)     Chronic obstructive airway disease (CMS HCC)     Chronic respiratory failure with hypoxia (CMS HCC)     Hypertension     Hypothyroidism     OSA (obstructive sleep apnea)     SOB (shortness of breath)  Type 2 diabetes mellitus (CMS HCC)          Past Surgical History:   Procedure Laterality Date    HX PELVIC LAPAROSCOPY      HX TONSILLECTOMY      HX TUBAL LIGATION      HYSTEROSCOPY WITH DILATION AND CURETTAGE N/A 04/09/2021    Performed by Seward Meth, DO at [facility]    LEG SURGERY      2-3 surgeries    NECK SURGERY      TUBOPLASTY / TUBOTUBAL ANASTOMOSIS      ULNAR NERVE REPAIR Bilateral      Family Medical History:       Problem Relation (Age of Onset)    No Known Problems Mother, Father             Social History     Socioeconomic History    Marital status: Widowed   Tobacco Use    Smoking status: Former     Average packs/day: 1.3 packs/day for 40.0 years (50.0 ttl pk-yrs)     Types: Cigarettes     Start date: 1975    Smokeless tobacco: Never   Vaping Use    Vaping status: Never Used   Substance and Sexual Activity    Alcohol use: Never    Drug use: Never     Social Determinants of Health     Financial Resource Strain: Low Risk  (06/01/2023)    Financial Resource Strain     SDOH  Financial: No   Transportation Needs: Low Risk  (06/01/2023)    Transportation Needs     SDOH Transportation: No   Social Connections: Medium Risk (06/01/2023)    Social Connections     SDOH Social Isolation: 3 to 5 times a week   Intimate Partner Violence: Low Risk  (06/01/2023)    Intimate Partner Violence     SDOH Domestic Violence: No   Housing Stability: Low Risk  (06/01/2023)    Housing Stability     SDOH Housing Situation: I have housing.     SDOH Housing Worry: No       Allergies:  Allergies   Allergen Reactions    Pseudoephedrine Hcl Hives/ Urticaria    Doxylamin-Pse-Dm-Acetaminophen  Other Adverse Reaction (Add comment), Hives/ Urticaria and Rash    Clindamycin Phosphate     Guaifenesin  Other Adverse Reaction (Add comment)    Cleocin [Clindamycin] Hives/ Urticaria    Tessalon [Benzonatate] Hives/ Urticaria       Medications:  Current Outpatient Medications   Medication Sig    albuterol sulfate (PROVENTIL OR VENTOLIN OR PROAIR) 90 mcg/actuation Inhalation oral inhaler Take 1-2 Puffs by inhalation Every 4 hours as needed    albuterol sulfate (PROVENTIL) 2.5 mg /3 mL (0.083 %) Inhalation nebulizer solution Take 3 mL (2.5 mg total) by nebulization Every 4 hours as needed for Wheezing    amitriptyline (ELAVIL) 25 mg Oral Tablet Take 1 Tablet (25 mg total) by mouth Every night    aspirin 81 mg Oral Tablet, Chewable Chew 1 Tablet (81 mg total) Once a day    baclofen (LIORESAL) 10 mg Oral Tablet Take 1 Tablet (10 mg total) by mouth Four times a day Indications: muscle spasms caused by a spinal disease    Blood Sugar Diagnostic (ACCU-CHEK GUIDE TEST STRIPS) Does not apply Strip 1 Strip Four times a day    Blood-Glucose Meter Misc 4 times per day    budesonide-formoteroL (SYMBICORT) 160-4.5 mcg/actuation Inhalation oral  inhaler Take 2 Puffs by inhalation Twice daily    calcium citrate 250 mg calcium Oral Tablet Take by mouth    cholecalciferol, Vitamin D3, 125 mcg (5,000 unit) Oral Tablet Take by oral route.     cyanocobalamin (VITAMIN B 12) 1,000 mcg Oral Tablet Take 1 Tablet (1,000 mcg total) by mouth Once a day    doxycycline hyclate (VIBRAMYCIN) 100 mg Oral Capsule Take 1 Capsule (100 mg total) by mouth Twice daily for 7 days    esomeprazole magnesium (NEXIUM) 40 mg Oral Capsule, Delayed Release(E.C.) TAKE 1 CAPSULE BY MOUTH IN THE MORNING BEFORE BREAKFAST    fluorouraciL (EFUDEX) 5 % Cream Apply topically    gabapentin (NEURONTIN) 600 mg Oral Tablet Take 1 Tablet (600 mg total) by mouth Three times a day    hydroCHLOROthiazide (HYDRODIURIL) 25 mg Oral Tablet Take 1 Tablet (25 mg total) by mouth Once a day    ketoconazole (NIZORAL) 2 % Shampoo USE 1/2 TO 1 (ONE-HALF TO ONE) OUNCE OF SHAMPOO TOPICALLY THREE TIMES A WEEK    Lactobac no.41/Bifidobact no.7 (PROBIOTIC-10 ORAL) Take by mouth Once a day    Lancets (ACCU-CHEK SOFTCLIX LANCETS) Misc 1 Each Four times a day    levothyroxine (SYNTHROID) 175 mcg Oral Tablet Take 1 Tablet (175 mcg total) by mouth Every morning    lisinopriL (PRINIVIL) 40 mg Oral Tablet Take 1 Tablet (40 mg total) by mouth Once a day    meloxicam (MOBIC) 15 mg Oral Tablet Take 1 Tablet (15 mg total) by mouth Once per day as needed for Pain    metFORMIN (GLUCOPHAGE) 500 mg Oral Tablet Take 1 Tablet (500 mg total) by mouth Every morning with breakfast    nystatin (MYCOSTATIN) 100,000 unit/gram Cream Apply 0.5 g t.i.d. to affected area.    nystatin (NYSTOP) 100,000 unit/gram Powder by Apply Topically route Three times a day as needed    pediatric multivitamins Oral Tablet, Chewable Chew 1 Tablet Once a day    pravastatin (PRAVACHOL) 10 mg Oral Tablet Take 1 Tablet (10 mg total) by mouth Every evening    sertraline (ZOLOFT) 100 mg Oral Tablet Take 1 Tablet (100 mg total) by mouth Once a day    solifenacin (VESICARE) 10 mg Oral Tablet Take 1 Tablet (10 mg total) by mouth Once a day    tirzepatide (MOUNJARO) 10 mg/0.5 mL Subcutaneous Pen Injector Inject 0.5 mL (10 mg total) under the skin Every 7 days     tolterodine (DETROL LA) 4 mg Oral Capsule, Sust. Release 24 hr Take 1 Capsule (4 mg total) by mouth Once a day    triamcinolone acetonide 0.1 % Cream Apply topically       Immunizations:  Immunization History   Administered Date(s) Administered    Covid-19 Vaccine,Moderna,12 Years+ 08/05/2020    Covid-19 Vaccine,Pfizer-BioNTech,Purple Top,75yrs+ 09/22/2019, 10/13/2019    DIPTH,PERTUSSIS-ACEL,TETANUS >10 YRS OLD 06/13/2012    Flu Family 05/28/2013    High-Dose Influenza Vaccine, 65+ 06/01/2011    Influenza Vaccine, 6 month-adult 06/05/2012, 05/21/2014, 05/28/2015, 05/17/2017, 05/14/2019, 06/01/2023       OBJECTIVE    BP 105/67   Pulse 80   Temp 36.4 C (97.5 F)   Ht 1.575 m (5\' 2" )   Wt 86.8 kg (191 lb 6.4 oz)   LMP  (LMP Unknown) Comment: stopped having periods at aget 44-46  SpO2 96%   BMI 35.01 kg/m       Physical Exam:  General: Pleasant, WNWD, NAD, AAO  Head: Normocephalic, AT,no lesions.  Bruise on right check.    Eyes: PERL, EOM's full, conjunctivae clear, fundi grossly normal.   Nose:  no rhinnorhea noted, no lesions  Throat: Clear, no exudates, no lesions MMM and pink.  Neck: Supple, no masses, no thyromegaly, no bruits.   Chest: Lungs clear, no rales, no rhonchi, no wheezes.   Heart: RRR, no murmurs, no rubs, no gallops.   Abdomen: Soft, no tenderness, no masses, BS normal.    Extremities: Normal gait, no deformities, no edema.    Neuro: Physiological, no localizing findings.   Skin:  Normal PWD,  warts on finger and dry skin otherwise  Psych: Mood and Affect normal      ASSESSMENT:        ICD-10-CM    1. Cellulitis, unspecified cellulitis site  L03.90 doxycycline hyclate (VIBRAMYCIN) 100 mg Oral Capsule      2. Muscle spasm  M62.838 baclofen (LIORESAL) 10 mg Oral Tablet      3. Hepatic cirrhosis, unspecified hepatic cirrhosis type, unspecified whether ascites present (CMS HCC)  K74.60       4. Centriacinar emphysema (CMS HCC)  J43.2       5. Moderate major depression (CMS HCC)  F32.1       6.  Diabetic polyneuropathy associated with type 2 diabetes mellitus (CMS HCC)  E11.42       7. Uncontrolled type 2 diabetes mellitus with hyperglycemia (CMS HCC)  E11.65 metFORMIN (GLUCOPHAGE) 500 mg Oral Tablet     HGA1C (HEMOGLOBIN A1C WITH EST AVG GLUCOSE)      8. Low back pain  M54.50 * XR LUMBAR SPINE SERIES      9. Mixed hyperlipidemia  E78.2       10. Essential hypertension  I10       11. Mild intermittent asthma without complication  J45.20       12. Primary insomnia  F51.01       13. OSA (obstructive sleep apnea)  G47.33       14. Heart murmur  R01.1       15. Restrictive lung disease  J98.4       16. Gastroesophageal reflux disease without esophagitis  K21.9       17. Hypothyroidism due to Hashimoto's thyroiditis  E06.3       18. Abdominal aortic atherosclerosis (CMS HCC)  I70.0       19. Neuropathy (CMS HCC)  G62.9       20. Obesity, unspecified class, unspecified obesity type, unspecified whether serious comorbidity present  E66.9           Body mass index is 35.01 kg/m. BMI addressed: Advised on diet, weight loss, and exercise to reduce above normal BMI.        Laboratory studies/data reviewed  I have reviewed all available and pertinent laboratory studies, images and health maintenance.    PLAN:    Records reviewed today:  Refill needed meds. ATTACHED  Pt was educated/ counseled on the decisions made today with their involvement in these plans.  Immunizations:  Encourage COVID booster  Preventive counseling:  Continue to monitor blood sugars and blood pressures  Diet and exercise reviewed  See dentist and eye doctor with regular scheduled visits.  Active listening/Asked pertinent questions.    A total of (31) minutes was spent on this patient encounter including review of historical information, examination, documentation and post visit activities.  Follow-up with all specialists  Refill hydrochlorothiazide take as directed hypertension monitor BP at home and in  office   Continue levothyroxine take as  directed hypothyroidism monitor lab  Continue  Zoloft take as directed mood continue to monitor  continue meloxicam take as directed the right is pool follow-up back scheduled follow-up with your ankle special as will  Continue Nexium take as directed GERD monitor for symptom control  continue Elavil can directed week goal to monitor for symptom control  continue gabapentin take as directed pull will monitor for symptom control  Ordered lumbar xray today will call with results   Refill baclofen take as directed stomach and back issues continue to monitor he told symptoms.  Recheck at next appointment  Take doxycycline as directed for cellulitis thumb left. Keep hydrated if symptoms worsen appears more infected we get a fever other concerning symptoms call the office or go to the ER  Have labs completed will call with results ordered A1c today.  Keep appointment with Dr. Alyson Locket [facility] as scheduled  Follow-up pulmonology as scheduled  Continue all prescribed medications as directed    BMI addressed: Advised on diet, weight loss, and exercise to reduce above normal BMI.    DMII - Follow a diabetic diet such as the ADA diet (American Diabetes Association). Take all medication as directed. Exercise and loose weight because even a 10 lb weight loss can make a big difference in your blood sugar. Keep an exercise log and record your weight once a week. Keep a blood sugar log and bring this to all appointments.  Recognize signs, symptoms, and treatment of hypoglycemia (treatment:  Glucose of any form of glucose-containing carbohydrate).  Control blood pressure and in here with antihypertensive medications.  Follow a low-carbohydrate low-fat diet, especially low in saturated fats.  Lower sodium intake.  Get regular aerobic exercise with a goal of at least 150 minutes per week of moderate intensity exercises.  Maintain a healthy body weight.    HTN Hypertension - Follow a low fat , low cholesterol diet. Gradually work up to 30  mins a day of exercise with a goal of exercising 3 to 6 days a week. Take all medication as directed. Follow a heart healthy diet and loose weight. BMI goal of 18.5-24.9 kg/m2. Keep a blood pressure log and bring this log to all appointments. .     Hyperlipidemia - Follow a low fat and low cholesterol diet. Exercise start slowly with the goal of exercising 3 to 5 days a week for at least 30 mins per day. Eating five small meals a day is better than three large meals. Keep an exercise log an bring it to your appointments.     OBESITY - start an exercise program and gradually work up to 30 mins to one hour 5 days a week. IF your can't do that much 10 minutes 3 times a day. Keep a calorie counting log and weigh yourself once a week. Join a waling group it can make exercise more fun. Join a support group. Bring your logs to all visits. Eat a high fiber low fat lower calorie diet.     Hypothyroidism  The patient is to continue to take it thyroid supplement.  Labs will be followed to follow the therapy.  Thyroid hormone replacement therapy is a very individualized treatment process, and is highly effective when prescribed properly.  The goal of the thyroid hormone replacement therapy, in most cases, is to normalize your thyroid stimulating hormone (TSH) levels.  Since you are prescribed a form of thyroid hormone replacement therapy, the goal is  to compensate for the lack of hormone secreted by your thyroid gland.  You will take a daily dose of T4 in a pill taken orally.  It is important to understand that every patient's therapy will be different.  There is no cookie cutter dosage or treatment plan and when it comes to thyroid hormone replacement therapy.  Held the body absorbs the hormone, along with the amount of hormones it needs, is very.  Your treatment plan will be individualized.  Such as, you should expect some degree of experimentation when it comes to finding the dosage in form of therapy that works best for  you.    FOLLOW UP:  Return in about 3 months (around 11/08/2023) for medicare wellness exam .    Patient can return sooner if needed.      The patient/care give was given ample opportunity to ask questions and those questions were answered to his/her satisfaction. A good faith effort was made to reconcile the patient's medications. The patient/caregiver was counseled on any appropriate vaccinations by the provider and questions were answered. The patient/care giver was told to contact me with any additional questions or concerns, or go to the ED in an emergency.       Bing Plume, DO. 08/10/23 12:20  [facility]  [facility]  [address]  [place], [place] [address]  Phone [phone]    This note may have been partially generated using M-Modal Fluency Direct system, and there may be some incorrect words, spellings, and punctuation that were not noted in checking the note before saving.

## 2023-08-10 NOTE — Nursing Note (Signed)
Body mass index is 35.01 kg/m.    Fall Risk Assessment  Do you feel unsteady when standing or walking?: Yes  Do you worry about falling?: No  Have you fallen in the past year?: No      PHQ Questionnaire  Little interest or pleasure in doing things.: Not at all  Feeling down, depressed, or hopeless: Several Days  PHQ 2 Total: 1              09/03/2022     2:57 PM   GAD-7 Questionnaire   Feeling nervous,anxious,on edge 0   Not being able to stop or control worrying 2   Worrying too much about different things 2   Trouble relaxing 0   Being so restless that it is hard to sit still 0   Becoming easily annoyed or irritable 0   Feeling afraid as if something awful might happen 0   How difficult have these problems made it for you to work, take care of things at home, or get along with other people? Somewhat difficult   Gad-7 Score Total 4   Interpretation 0-4, normal         Review Flowsheet  More data exists         08/10/2023   FUNCTIONAL HEALTH SCREENING   Because we are aware of abuse and domestic violence today, we ask all patients: Are you being hurt, hit, or frightened by anyone at your home or in your life?  N   Do you have any basic needs within your home that are not being met? (such as Food, Shelter, [employer] fast streamer, Tranportation, paying for bills and/or medications) N      Details                    Travel Screening       Question Response    Have you been in contact with someone who was sick? --    Do you have any of the following new or worsening symptoms? None of these    Have you traveled internationally in the last month? No          Travel History   Travel since 07/10/23    No documented travel since 07/10/23         Sharen Hones, CMA  08/10/2023, 11:00

## 2023-08-11 DIAGNOSIS — M545 Low back pain, unspecified: Secondary | ICD-10-CM

## 2023-09-01 ENCOUNTER — Other Ambulatory Visit (INDEPENDENT_AMBULATORY_CARE_PROVIDER_SITE_OTHER): Payer: Self-pay | Admitting: FAMILY MEDICINE

## 2023-09-01 DIAGNOSIS — E119 Type 2 diabetes mellitus without complications: Secondary | ICD-10-CM

## 2023-09-01 DIAGNOSIS — E1142 Type 2 diabetes mellitus with diabetic polyneuropathy: Secondary | ICD-10-CM

## 2023-09-01 MED ORDER — MOUNJARO 10 MG/0.5 ML SUBCUTANEOUS PEN INJECTOR
10.0000 mg | PEN_INJECTOR | SUBCUTANEOUS | 0 refills | Status: DC
Start: 2023-09-01 — End: 2023-09-29

## 2023-09-05 ENCOUNTER — Telehealth (INDEPENDENT_AMBULATORY_CARE_PROVIDER_SITE_OTHER): Payer: Self-pay | Admitting: FAMILY MEDICINE

## 2023-09-05 NOTE — Telephone Encounter (Addendum)
 PT called in and stated that it is " Ridiculous for her to go to the clinic for her thumb. She states you know what it looks like. She states that you told her that you could send her to someone to take care of it. She stated to remind you that it was her thumb and the doxy didn't help.    Sharen Hones, CMA

## 2023-09-08 NOTE — Telephone Encounter (Signed)
 Left patient a message to call office back.    Sharen Hones, CMA

## 2023-09-09 ENCOUNTER — Other Ambulatory Visit (INDEPENDENT_AMBULATORY_CARE_PROVIDER_SITE_OTHER): Payer: Self-pay | Admitting: FAMILY MEDICINE

## 2023-09-09 DIAGNOSIS — R5383 Other fatigue: Secondary | ICD-10-CM

## 2023-09-09 DIAGNOSIS — E119 Type 2 diabetes mellitus without complications: Secondary | ICD-10-CM

## 2023-09-09 DIAGNOSIS — K219 Gastro-esophageal reflux disease without esophagitis: Secondary | ICD-10-CM

## 2023-09-20 ENCOUNTER — Encounter (INDEPENDENT_AMBULATORY_CARE_PROVIDER_SITE_OTHER): Payer: Self-pay

## 2023-09-22 ENCOUNTER — Ambulatory Visit: Attending: NURSE PRACTITIONER | Admitting: NURSE PRACTITIONER

## 2023-09-22 ENCOUNTER — Other Ambulatory Visit: Payer: Self-pay

## 2023-09-22 ENCOUNTER — Encounter (INDEPENDENT_AMBULATORY_CARE_PROVIDER_SITE_OTHER): Payer: Self-pay | Admitting: NURSE PRACTITIONER

## 2023-09-22 VITALS — BP 112/70 | HR 96 | Resp 18 | Ht 62.0 in | Wt 191.0 lb

## 2023-09-22 DIAGNOSIS — E66812 Obesity, class 2: Secondary | ICD-10-CM | POA: Insufficient documentation

## 2023-09-22 DIAGNOSIS — J302 Other seasonal allergic rhinitis: Secondary | ICD-10-CM | POA: Insufficient documentation

## 2023-09-22 DIAGNOSIS — G4733 Obstructive sleep apnea (adult) (pediatric): Secondary | ICD-10-CM | POA: Insufficient documentation

## 2023-09-22 DIAGNOSIS — J4520 Mild intermittent asthma, uncomplicated: Secondary | ICD-10-CM | POA: Insufficient documentation

## 2023-09-22 DIAGNOSIS — Z87891 Personal history of nicotine dependence: Secondary | ICD-10-CM | POA: Insufficient documentation

## 2023-09-22 DIAGNOSIS — J432 Centrilobular emphysema: Secondary | ICD-10-CM | POA: Insufficient documentation

## 2023-09-22 DIAGNOSIS — Z6835 Body mass index (BMI) 35.0-35.9, adult: Secondary | ICD-10-CM | POA: Insufficient documentation

## 2023-09-22 DIAGNOSIS — J9610 Chronic respiratory failure, unspecified whether with hypoxia or hypercapnia: Secondary | ICD-10-CM

## 2023-09-22 DIAGNOSIS — I10 Essential (primary) hypertension: Secondary | ICD-10-CM | POA: Insufficient documentation

## 2023-09-22 MED ORDER — TIOTROPIUM BROMIDE 18 MCG CAPSULE WITH INHALATION DEVICE
18.0000 ug | ORAL_CAPSULE | Freq: Every day | RESPIRATORY_TRACT | 11 refills | Status: DC
Start: 2023-09-22 — End: 2024-04-05
  Filled 2023-09-22 (×2): qty 30, 30d supply, fill #0
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
## 2023-09-22 NOTE — Nursing Note (Signed)
 Is the patient currently on oxygen therapy? Yes  What Liter Flow is the patient on? 2L at night occasionally    What DME company does the patient utilize for oxygen therapy?  N/A  What is the patient on? n/a  What DME company does the patient utilize for CPAP/BiPAP/Trilogy? N/A  Smoking History:    Social History     Tobacco Use   Smoking Status Former    Current packs/day: 0.00    Average packs/day: 1.3 packs/day for 40.0 years (50.0 ttl pk-yrs)    Types: Cigarettes    Start date: 37    Quit date: 2015    Years since quitting: 10.2   Smokeless Tobacco Never      Have you received a flu shot? Yes  Have you received a pneumonia shot? No  Has the patient had any tests performed since the last visit? None  If so, where were the test(s) performed? N/A  Has the patient had any recent hospitalizations? No  Does the patient have any other associated symptoms or modifying factors? Shortness of breath         Arnold Long, [place]

## 2023-09-29 ENCOUNTER — Other Ambulatory Visit (INDEPENDENT_AMBULATORY_CARE_PROVIDER_SITE_OTHER): Payer: Self-pay | Admitting: FAMILY MEDICINE

## 2023-09-29 DIAGNOSIS — E119 Type 2 diabetes mellitus without complications: Secondary | ICD-10-CM

## 2023-09-29 DIAGNOSIS — E1142 Type 2 diabetes mellitus with diabetic polyneuropathy: Secondary | ICD-10-CM

## 2023-09-29 MED ORDER — MOUNJARO 10 MG/0.5 ML SUBCUTANEOUS PEN INJECTOR
10.0000 mg | PEN_INJECTOR | SUBCUTANEOUS | 0 refills | Status: AC
Start: 2023-09-29 — End: ?

## 2023-09-30 ENCOUNTER — Telehealth (INDEPENDENT_AMBULATORY_CARE_PROVIDER_SITE_OTHER): Payer: Self-pay | Admitting: FAMILY MEDICINE

## 2023-09-30 NOTE — Telephone Encounter (Signed)
 Rain called in and wanted to know why Dr. Jordis Nevins did not increase her mounjaro  when she sent in refill. Comments added by Evelia Hipp, MA on 09/30/23 at 15:51.

## 2023-10-01 ENCOUNTER — Emergency Department
Admission: EM | Admit: 2023-10-01 | Discharge: 2023-10-02 | Disposition: A | Discharge status: Home / Self Care | Attending: Emergency Medicine | Admitting: Emergency Medicine

## 2023-10-01 ENCOUNTER — Other Ambulatory Visit: Payer: Self-pay

## 2023-10-01 ENCOUNTER — Encounter (HOSPITAL_COMMUNITY): Payer: Self-pay

## 2023-10-01 DIAGNOSIS — S61411A Laceration without foreign body of right hand, initial encounter: Secondary | ICD-10-CM | POA: Insufficient documentation

## 2023-10-01 DIAGNOSIS — W268XXA Contact with other sharp object(s), not elsewhere classified, initial encounter: Secondary | ICD-10-CM | POA: Insufficient documentation

## 2023-10-01 DIAGNOSIS — Z23 Encounter for immunization: Secondary | ICD-10-CM | POA: Insufficient documentation

## 2023-10-01 NOTE — ED Nurses Note (Signed)
Chlorhexidine soak

## 2023-10-02 ENCOUNTER — Ambulatory Visit (HOSPITAL_COMMUNITY)

## 2023-10-02 ENCOUNTER — Ambulatory Visit (INDEPENDENT_AMBULATORY_CARE_PROVIDER_SITE_OTHER): Payer: Self-pay | Admitting: FAMILY MEDICINE

## 2023-10-02 DIAGNOSIS — E119 Type 2 diabetes mellitus without complications: Secondary | ICD-10-CM | POA: Insufficient documentation

## 2023-10-02 DIAGNOSIS — R5383 Other fatigue: Secondary | ICD-10-CM | POA: Insufficient documentation

## 2023-10-02 DIAGNOSIS — R945 Abnormal results of liver function studies: Secondary | ICD-10-CM

## 2023-10-02 DIAGNOSIS — K760 Fatty (change of) liver, not elsewhere classified: Secondary | ICD-10-CM

## 2023-10-02 LAB — CBC WITH DIFF
BASOPHIL #: <0.1 10*3/uL (ref <=0.20)
BASOPHIL %: 0.4 %
EOSINOPHIL #: 0.27 10*3/uL (ref <=0.50)
EOSINOPHIL %: 5.8 %
HCT: 31.7 % — ABNORMAL LOW (ref 34.8–46.0)
HGB: 9.7 g/dL — ABNORMAL LOW (ref 11.5–16.0)
IMMATURE GRANULOCYTE #: <0.1 10*3/uL (ref <0.10)
IMMATURE GRANULOCYTE %: 0.2 % (ref 0.0–1.0)
LYMPHOCYTE #: 1.53 10*3/uL (ref 1.00–4.80)
LYMPHOCYTE %: 32.8 %
MCH: 26 pg (ref 26.0–32.0)
MCHC: 30.6 g/dL — ABNORMAL LOW (ref 31.0–35.5)
MCV: 85 fL (ref 78.0–100.0)
MONOCYTE #: 0.5 10*3/uL (ref 0.20–1.10)
MONOCYTE %: 10.7 %
MPV: 11.9 fL (ref 8.7–12.5)
NEUTROPHIL #: 2.34 10*3/uL (ref 1.50–7.70)
NEUTROPHIL %: 50.1 %
PLATELETS: 122 10*3/uL — ABNORMAL LOW (ref 150–400)
RBC: 3.73 10*6/uL — ABNORMAL LOW (ref 3.85–5.22)
RDW-CV: 15.5 % (ref 11.5–15.5)
WBC: 4.7 10*3/uL (ref 3.7–11.0)

## 2023-10-02 LAB — COMPREHENSIVE METABOLIC PANEL, NON-FASTING
ALBUMIN: 3.6 g/dL (ref 3.4–4.8)
ALKALINE PHOSPHATASE: 72 U/L (ref 50–130)
ALT (SGPT): 16 U/L (ref <31)
ANION GAP: 9 mmol/L (ref 4–13)
AST (SGOT): 25 U/L (ref 11–34)
BILIRUBIN TOTAL: 0.3 mg/dL (ref 0.3–1.3)
BUN/CREA RATIO: 24 — ABNORMAL HIGH (ref 6–22)
BUN: 24 mg/dL (ref 8–25)
CALCIUM: 9.7 mg/dL (ref 8.6–10.3)
CHLORIDE: 107 mmol/L (ref 96–111)
CO2 TOTAL: 27 mmol/L (ref 23–31)
CREATININE: 1 mg/dL (ref 0.60–1.05)
ESTIMATED GFR - FEMALE: 64 mL/min/BSA (ref >=60)
GLUCOSE: 99 mg/dL (ref 65–125)
POTASSIUM: 4.5 mmol/L (ref 3.5–5.1)
PROTEIN TOTAL: 7.2 g/dL (ref 6.0–8.0)
SODIUM: 143 mmol/L (ref 136–145)

## 2023-10-02 LAB — FERRITIN: FERRITIN: 35 ng/mL (ref 5–200)

## 2023-10-02 LAB — PT/INR
INR: 1.13 (ref 0.94–1.14)
PROTHROMBIN TIME: 13.1 s — ABNORMAL HIGH (ref 10.5–12.7)

## 2023-10-02 MED ORDER — DIPHTH,PERTUSSIS(ACEL),TETANUS 2.5 LF UNIT-8 MCG-5 LF/0.5ML IM SYRINGE
0.5000 mL | INJECTION | INTRAMUSCULAR | Status: AC
Start: 2023-10-02 — End: 2023-10-02
  Administered 2023-10-02: 0.5 mL via INTRAMUSCULAR
  Filled 2023-10-02: qty 0.5

## 2023-10-02 MED ORDER — BACITRACIN 500 UNIT/GRAM TOPICAL PACKET
1.0000 | PACK | CUTANEOUS | Status: AC
Start: 2023-10-02 — End: 2023-10-02
  Administered 2023-10-02: 1 via TOPICAL
  Filled 2023-10-02: qty 1

## 2023-10-02 NOTE — ED Nurses Note (Signed)
 Nonstick dressing applied

## 2023-10-02 NOTE — Discharge Instructions (Signed)
 Follow-up in 10 days to have the sutures removed.  Keep the wound clean and dry by washing with soap and water twice a day and then apply antibiotic ointment such as Neosporin or bacitracin  twice a day.  Tylenol  or ibuprofen as needed for pain.  Do not submerge the wound understanding water such as in a sink or bathtub.  You may have running water across it to rinse it after you washing it but then keep it dry otherwise.

## 2023-10-02 NOTE — ED Provider Notes (Signed)
 Department of Emergency Medicine  [facility]  10/01/2023            HPI:  Patient is a 63 y.o. female presenting to the ED with chief complaint of Laceration (Cut side of rt hand on the stove 0500 this am)  The patient is ambulatory to the emergency department with complaints having cut the side of her right hand this morning around 5:00 a.m. when she was cleaning her stove.  She in his she just put a Band-Aid on it and went on with her day but it continues to bleed so she decided to come to the emergency department to have it evaluated.  Her tetanus is not current.              Past Medical History:   Diagnosis Date    Asthma     Centrilobular emphysema (CMS HCC)     Chronic obstructive airway disease (CMS HCC)     Chronic respiratory failure with hypoxia (CMS HCC)     Hypertension     Hypothyroidism     OSA (obstructive sleep apnea)     SOB (shortness of breath)     Type 2 diabetes mellitus (CMS HCC)      Past Surgical History:   Procedure Laterality Date    Hx pelvic laparoscopy      Hx tonsillectomy      Hx tubal ligation      Leg surgery      Neck surgery      Tuboplasty / tubotubal anastomosis      Ulnar nerve repair Bilateral      Allergies   Allergen Reactions    Pseudoephedrine Hcl Hives/ Urticaria    Doxylamin-Pse-Dm-Acetaminophen   Other Adverse Reaction (Add comment), Hives/ Urticaria and Rash    Clindamycin Phosphate     Guaifenesin   Other Adverse Reaction (Add comment)    Cleocin [Clindamycin] Hives/ Urticaria    Tessalon [Benzonatate] Hives/ Urticaria      Current Outpatient Medications   Medication Sig    albuterol  sulfate (PROVENTIL  OR VENTOLIN  OR PROAIR ) 90 mcg/actuation Inhalation oral inhaler Take 1-2 Puffs by inhalation Every 4 hours as needed    albuterol  sulfate (PROVENTIL ) 2.5 mg /3 mL (0.083 %) Inhalation nebulizer solution Take 3 mL (2.5 mg total) by nebulization Every 4 hours as needed for Wheezing    amitriptyline  (ELAVIL ) 25 mg Oral Tablet Take 1 Tablet (25 mg total) by  mouth Every night    aspirin 81 mg Oral Tablet, Chewable Chew 1 Tablet (81 mg total) Daily    baclofen  (LIORESAL ) 10 mg Oral Tablet Take 1 Tablet (10 mg total) by mouth Four times a day Indications: 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    cyanocobalamin (VITAMIN B 12) 1,000 mcg Oral Tablet Take 1 Tablet (1,000 mcg total) by mouth Daily    esomeprazole  magnesium  (NEXIUM ) 40 mg Oral Capsule, Delayed Release(E.C.) TAKE 1 CAPSULE BY MOUTH IN THE MORNING BEFORE BREAKFAST    fluorouraciL (EFUDEX) 5 % Cream Apply topically    gabapentin  (  NEURONTIN ) 600 mg Oral Tablet Take 1 Tablet (600 mg total) by mouth Three times a day    hydroCHLOROthiazide  (HYDRODIURIL ) 25 mg Oral Tablet Take 1 Tablet (25 mg total) by mouth Once a day    ketoconazole  (NIZORAL ) 2 % Shampoo USE 1/2 TO 1 (ONE-HALF TO ONE) OUNCE OF SHAMPOO TOPICALLY THREE TIMES A WEEK    Lactobac no.41/Bifidobact no.7 (PROBIOTIC-10 ORAL) Take by mouth Once a day    Lancets (ACCU-CHEK SOFTCLIX LANCETS) Misc 1 Each Four times a day    levothyroxine  (SYNTHROID) 175 mcg Oral Tablet Take 1 Tablet (175 mcg total) by mouth Every morning    lisinopriL  (PRINIVIL ) 40 mg Oral Tablet Take 1 Tablet (40 mg total) by mouth Once a day    meloxicam  (MOBIC ) 15 mg Oral Tablet Take 1 Tablet (15 mg total) by mouth Once per day as needed for Pain    nystatin  (MYCOSTATIN ) 100,000 unit/gram Cream Apply 0.5 g t.i.d. to affected area.    nystatin  (NYSTOP ) 100,000 unit/gram Powder by Apply Topically route Three times a day as needed    pediatric multivitamins Oral Tablet, Chewable Chew 1  Tablet Daily    pravastatin  (PRAVACHOL ) 10 mg Oral Tablet Take 1 Tablet (10 mg total) by mouth Every evening    sertraline  (ZOLOFT ) 100 mg Oral Tablet Take 1 Tablet (100 mg total) by mouth Once a day    solifenacin (VESICARE) 10 mg Oral Tablet Take 1 Tablet (10 mg total) by mouth Daily    tiotropium bromide  (SPIRIVA  HANDIHALER) 18 mcg Inhalation Capsule, w/Inhalation Device Take 1 Capsule (18 mcg total) by inhalation Once a day    tirzepatide  (MOUNJARO ) 10 mg/0.5 mL Subcutaneous Pen Injector Inject 0.5 mL (10 mg total) under the skin Every 7 days    tolterodine (DETROL LA) 4 mg Oral Capsule, Sust. Release 24 hr Take 1 Capsule (4 mg total) by mouth Daily    triamcinolone acetonide 0.1 % Cream Apply topically      Above history reviewed with patient.  Previous records also reviewed.       All other systems reviewed and are negative, unless commented on in the HPI.       Filed Vitals:    10/01/23 2250   BP: (!) 106/55   Pulse: 70   Resp: 18   Temp: 36.2 C (97.2 F)   SpO2: 93%       Physical Exam  Vitals and nursing note reviewed.   Constitutional:       General: She is not in acute distress.     Appearance: Normal appearance.   HENT:      Head: Normocephalic and atraumatic.      Mouth/Throat:      Mouth: Mucous membranes are moist.      Pharynx: Oropharynx is clear.   Eyes:      Extraocular Movements: Extraocular movements intact.      Conjunctiva/sclera: Conjunctivae normal.      Pupils: Pupils are equal, round, and reactive to light.   Neck:      Thyroid : No thyromegaly.      Vascular: No JVD.      Trachea: No tracheal deviation.   Cardiovascular:      Rate and Rhythm: Normal rate and regular rhythm.      Pulses: Normal pulses.   Musculoskeletal:         General: Tenderness present. Normal range of motion.      Cervical back: Normal range of motion and neck supple.  Comments: 3 cm laceration to the ulnar aspect of the right hand which is elliptical.  No tendon involvement.  Neurovascular is intact.  Capillary  refill less than 2 seconds.  Normal range of motion of all digits hand and wrist.   Lymphadenopathy:      Cervical: No cervical adenopathy.   Skin:     General: Skin is warm and dry.      Capillary Refill: Capillary refill takes less than 2 seconds.      Findings: No rash.   Neurological:      General: No focal deficit present.      Mental Status: She is alert and oriented to person, place, and time.      Cranial Nerves: No cranial nerve deficit.      Gait: Gait is intact.      Deep Tendon Reflexes: Reflexes are normal and symmetric.   Psychiatric:         Mood and Affect: Mood and affect normal.         Cognition and Memory: Memory normal.         Judgment: Judgment normal.            LAC REPAIR/WOUND CLOSURE    Date/Time: 10/02/2023 1:44 AM    Performed by: Fran Imus, DO  Authorized by: Fran Imus, DO    Consent:     Consent obtained:  Verbal    Risks discussed:  Infection, pain and poor wound healing    Alternatives discussed:  No treatment  Universal protocol:     Patient identity confirmed:  Verbally with patient  Anesthesia:     Anesthesia method:  Local infiltration    Local anesthetic:  Lidocaine  1% w/o epi  Laceration details:     Location:  Hand    Hand location:  R palm    Length (cm):  3  Pre-procedure details:     Preparation:  Patient was prepped and draped in usual sterile fashion  Exploration:     Wound exploration: wound explored through full range of motion and entire depth of wound visualized      Contaminated: yes    Treatment:     Area cleansed with:  Chlorhexidine  and saline    Amount of cleaning:  Standard    Debridement:  None  Skin repair:     Repair method:  Sutures    Suture size:  4-0    Suture material:  Nylon    Suture technique:  Simple interrupted    Number of sutures:  6  Approximation:     Approximation:  Close  Repair type:     Repair type:  Simple  Post-procedure details:     Dressing:  Antibiotic ointment and sterile dressing    Procedure completion:  Tolerated well,  no immediate complications           Workup:     No results found for this or any previous visit (from the past 24 hours).         Orders Placed This Encounter    diphtheria, pertussis-acell, tetanus (BOOSTRIX ) IM injection    bacitracin  500 units/gram topical ointment packet           Abnormal Lab results:  Labs Ordered/Reviewed - No data to display      Medical Decision Making  Problems Addressed:  Laceration of right hand: acute illness or injury    Risk  OTC drugs.  Prescription drug management.  ED Course:  Appropriate labs and imaging ordered. Medical Records reviewed.   During the patient's stay in the emergency department, the above listed imaging and/or labs were performed to assist with medical decision making and were reviewed by myself when available for review.  Pt remained stable throughout the emergency department course.    The patient was seen and evaluated.  The patient is ambulatory to the emergency department with complaints having cut the side of her right hand this morning around 5:00 a.m. when she was cleaning her stove.  She in his she just put a Band-Aid on it and went on with her day but it continues to bleed so she decided to come to the emergency department to have it evaluated.  Her tetanus is not current.  The patient has tetanus was updated in the emergency department.  The wound was repaired under sterile conditions.  It was cleansed with saline and Hibiclens  solution.  Wound was repaired with a 4-0 nylon x6 simple interrupted sutures.  No complications and the patient tolerated the procedure well.  Antibiotic ointment and a sterile dressing was applied.  Follow up in 10 days for suture removal.  Tylenol  or Motrin as needed.  The patient is stable throughout the ED visit was discharged in good condition.          Impression:    Clinical Impression   Laceration of right hand (Primary)      Disposition:  Discharged     Follow Up Plan:  Follow-up with PCP or return to the  emergency room with any worsening symptoms.

## 2023-10-03 ENCOUNTER — Telehealth (HOSPITAL_COMMUNITY): Payer: Self-pay | Admitting: FAMILY MEDICINE

## 2023-10-03 LAB — HEPATITIS B CORE IGM, AB: HBV CORE IGM ANTIBODY QUALITATIVE: NEGATIVE

## 2023-10-03 LAB — HEPATITIS A (HAV) IGM ANTIBODY: HAV IGM: NEGATIVE

## 2023-10-03 LAB — IRON TRANSFERRIN AND TIBC
IRON (TRANSFERRIN) SATURATION: 11 % — ABNORMAL LOW (ref 15–50)
IRON: 42 ug/dL — ABNORMAL LOW (ref 45–170)
TOTAL IRON BINDING CAPACITY: 377 ug/dL (ref 224–476)
TRANSFERRIN: 269 mg/dL (ref 160–340)

## 2023-10-03 LAB — HEPATITIS B SURFACE ANTIGEN: HBV SURFACE ANTIGEN QUALITATIVE: NEGATIVE

## 2023-10-03 LAB — CERULOPLASMIN: CERULOPLASMIN: 31 mg/dL (ref 18–45)

## 2023-10-03 LAB — HEPATITIS C ANTIBODY SCREEN WITH REFLEX TO HCV PCR: HCV ANTIBODY QUALITATIVE: NEGATIVE

## 2023-10-03 LAB — ALPHA-1-ANTITRYPSIN: ALPHA 1 ANTITRYPSIN: 178 mg/dL (ref 90–200)

## 2023-10-03 NOTE — Progress Notes (Signed)
 Post Ed Follow-Up    Post ED Follow-Up:   Document completed and/or attempted interactive contact(s) after transition to home after emergency department stay.:   Transition Facility and relevant Date:   Discharge Date: 10/02/23  Discharge from [facility] Emergency Department?: Yes  Discharge Facility: [facility]  Contacted by: Coralee Derby, RN  Contact method: Patient/Caregiver Telephone  Contact completed: 10/03/2023  1:26 PM  MyChart message sent?: No  Was the AVS reviewed with patient?: Yes  Did the patient attempt to reach their PCP prior to going to the ED?: No  How is the patient recovering?: Improving  Medications prescribed: No  Interventions: No needs identified  Per AVS pt to have sutures removed in 10 days. Encouraged for patient to schedule appt with PCP for follow up. Advised pt that she may be able to have sutures removed in office or return to the ED for suture removal. Pt advised that she will be removing sutures at home with help of her sister who is a Engineer, civil (consulting).       Marca Senna, RN

## 2023-10-04 LAB — HEP-2 SUBSTRATE ANTINUCLEAR ANTIBODIES (ANA), SERUM: ANA INTERPRETATION: NEGATIVE

## 2023-10-04 NOTE — Telephone Encounter (Signed)
 Post Ed Follow-Up    Post ED Follow-Up:   Document completed and/or attempted interactive contact(s) after transition to home after emergency department stay.:   Transition Facility and relevant Date:   Discharge Date: 10/02/23  Discharge from [facility] Emergency Department?: Yes  Discharge Facility: [facility]  Contacted by: Coralee Derby, RN  Contact method: Patient/Caregiver Telephone  Contact completed: 10/03/2023  1:26 PM  MyChart message sent?: No  Was the AVS reviewed with patient?: Yes  Did the patient attempt to reach their PCP prior to going to the ED?: No  How is the patient recovering?: Improving  Medications prescribed: No  Interventions: No needs identified  Per AVS pt to have sutures removed in 10 days. Encouraged for patient to schedule appt with PCP for follow up. Advised pt that she may be able to have sutures removed in office or return to the ED for suture removal. Pt advised that she will be removing sutures at home with help of her sister who is a Engineer, civil (consulting).

## 2023-10-05 LAB — LIVER/KIDNEY MICROSOME (LKM) TYPE 1 ANTIBODIES, SERUM: LKM-1 ANTIBODY (IGG): <=20 U (ref <=20.0)

## 2023-10-06 LAB — MITOCHONDRIAL ANTIBODIES (M2), SERUM: MITOCHONDRIA M2 ANTIBODY (IGG), EIA: <=20 U (ref <=20.0)

## 2023-10-07 LAB — SMOOTH MUSCLE AB TITER: SMOOTH MUSCLE AB TITER: 1:20 {titer} — ABNORMAL HIGH

## 2023-10-07 LAB — SMOOTH MUSCLE ANTIBODIES, SERUM: SMOOTH MUSCLE AB SCREEN, S: POSITIVE — AB

## 2023-10-09 ENCOUNTER — Other Ambulatory Visit: Payer: Self-pay

## 2023-10-10 ENCOUNTER — Ambulatory Visit (HOSPITAL_BASED_OUTPATIENT_CLINIC_OR_DEPARTMENT_OTHER): Payer: Self-pay | Admitting: Obstetrics & Gynecology

## 2023-10-10 ENCOUNTER — Ambulatory Visit (HOSPITAL_BASED_OUTPATIENT_CLINIC_OR_DEPARTMENT_OTHER): Admitting: Obstetrics & Gynecology

## 2023-10-12 ENCOUNTER — Emergency Department
Admission: EM | Admit: 2023-10-12 | Discharge: 2023-10-13 | Disposition: A | Discharge status: Home / Self Care | Attending: Family Medicine | Admitting: Family Medicine

## 2023-10-12 ENCOUNTER — Ambulatory Visit (HOSPITAL_BASED_OUTPATIENT_CLINIC_OR_DEPARTMENT_OTHER): Admitting: Obstetrics & Gynecology

## 2023-10-12 ENCOUNTER — Encounter (HOSPITAL_COMMUNITY): Payer: Self-pay

## 2023-10-12 ENCOUNTER — Other Ambulatory Visit: Payer: Self-pay

## 2023-10-12 DIAGNOSIS — S61411D Laceration without foreign body of right hand, subsequent encounter: Secondary | ICD-10-CM | POA: Insufficient documentation

## 2023-10-12 DIAGNOSIS — L0390 Cellulitis, unspecified: Secondary | ICD-10-CM

## 2023-10-12 DIAGNOSIS — R238 Other skin changes: Secondary | ICD-10-CM | POA: Insufficient documentation

## 2023-10-12 DIAGNOSIS — B3783 Candidal cheilitis: Secondary | ICD-10-CM | POA: Insufficient documentation

## 2023-10-12 DIAGNOSIS — Z4802 Encounter for removal of sutures: Secondary | ICD-10-CM

## 2023-10-12 DIAGNOSIS — L03113 Cellulitis of right upper limb: Secondary | ICD-10-CM | POA: Insufficient documentation

## 2023-10-12 MED ORDER — AMOXICILLIN 875 MG-POTASSIUM CLAVULANATE 125 MG TABLET
1.0000 | ORAL_TABLET | ORAL | Status: AC
Start: 2023-10-13 — End: 2023-10-13
  Administered 2023-10-13: 1 via ORAL
  Filled 2023-10-12: qty 1

## 2023-10-12 MED ORDER — TRAMADOL 50 MG TABLET - ED TO GO
1.0000 | ORAL_TABLET | ORAL | Status: AC
Start: 2023-10-13 — End: 2023-10-13
  Administered 2023-10-13: 1
  Filled 2023-10-12: qty 2

## 2023-10-13 ENCOUNTER — Encounter (HOSPITAL_COMMUNITY): Payer: Self-pay | Admitting: Family Medicine

## 2023-10-13 ENCOUNTER — Telehealth (INDEPENDENT_AMBULATORY_CARE_PROVIDER_SITE_OTHER): Payer: Self-pay | Admitting: FAMILY MEDICINE

## 2023-10-13 MED ORDER — AMOXICILLIN 875 MG-POTASSIUM CLAVULANATE 125 MG TABLET
1.0000 | ORAL_TABLET | Freq: Two times a day (BID) | ORAL | 0 refills | Status: AC
Start: 2023-10-12 — End: 2023-10-22

## 2023-10-13 MED ORDER — CLOTRIMAZOLE 10 MG TROCHE
10.0000 mg | Freq: Every day | 0 refills | Status: AC
Start: 2023-10-12 — End: 2023-10-26

## 2023-10-13 MED ORDER — ACYCLOVIR 5 % TOPICAL OINTMENT
TOPICAL_OINTMENT | Freq: Every day | CUTANEOUS | 0 refills | Status: AC
Start: 2023-10-12 — End: 2023-10-16

## 2023-10-13 MED ORDER — NYSTATIN 100,000 UNIT/GRAM TOPICAL CREAM
TOPICAL_CREAM | Freq: Two times a day (BID) | CUTANEOUS | 0 refills | Status: DC
Start: 2023-10-12 — End: 2023-11-24

## 2023-10-13 NOTE — ED Nurses Note (Signed)
 Sutures removed from right hand. Total of 5 sutures removed. Patient tolerated procedure well.  Steri Strips applied to wound and covered with a large Band-Aid.

## 2023-10-13 NOTE — Telephone Encounter (Signed)
 Post Ed Follow-Up    Post ED Follow-Up:   Document completed and/or attempted interactive contact(s) after transition to home after emergency department stay.:   Transition Facility and relevant Date:   Discharge Date: 10/12/23  Discharge from [facility] Emergency Department?: Yes  Discharge Facility: [facility]  Contacted by: Angela Frey  Contact method: Patient/Caregiver Telephone  Contact completed: 10/13/2023  1:42 PM  MyChart message sent?: No  Was the AVS reviewed with patient?: Yes  Did the patient attempt to reach their PCP prior to going to the ED?: No  How is the patient recovering?: No better or worse  Medications prescribed: Yes  Were they obtained?: Yes  Interventions: No needs identified  Follow Up Visit: Follow Up Appt Scheduled  Angela Frey called in and said that she was told by the ED to see Angela Frey within 3 days of having stitches out for a wound check. Comments added by Evelia Hipp, MA on 10/13/23 at 13:45.

## 2023-10-13 NOTE — ED Provider Notes (Signed)
 [facility] - Emergency Department  ED Primary Provider Note  History of Present Illness   Chief Complaint   Patient presents with    Wound Check     Pt here for suture removal. Pt had sutures placed on rt hand. Pt had sutures placed about 10 days ago.      Angela Frey is a 63 y.o. female who had concerns including Wound Check.  Arrival: The patient arrived by Car    Patient's ER for wound check/suture removal sutures right hand.  Sutures have been in place for around 10 days the central portion of the wound looks as though some of the sutures have already come out in the wound does have some dehiscence there was no drainage from narrowing there is no foul odor however there is some surrounding erythema and some tenderness especially at the palmar portion of the wound patient is also complaining of some irritation in the corners of her mouth as well as a possible fever blister on her upper lip        History Reviewed This Encounter: Medical History  Surgical History  Family History  Social History    Physical Exam   ED Triage Vitals [10/12/23 2326]   BP (Non-Invasive) (!) 99/56   Heart Rate 86   Respiratory Rate 18   Temperature 36.1 C (96.9 F)   SpO2 95 %   Weight 85.3 kg (188 lb)   Height 1.626 m (5\' 4" )     Physical Exam  HENT:      Head: Normocephalic and atraumatic.      Mouth/Throat:      Mouth: Mucous membranes are moist.     Cardiovascular:      Rate and Rhythm: Normal rate.      Pulses: Normal pulses.   Pulmonary:      Effort: Pulmonary effort is normal. No respiratory distress.   Musculoskeletal:         General: Tenderness present. Normal range of motion.        Hands:    Skin:     General: Skin is warm and dry.      Capillary Refill: Capillary refill takes less than 2 seconds.      Findings: Erythema present.   Neurological:      General: No focal deficit present.      Mental Status: She is alert and oriented to person, place, and time.   Psychiatric:         Mood and Affect:  Mood normal.         Behavior: Behavior normal.         Thought Content: Thought content normal.         Judgment: Judgment normal.       Patient Data   Labs Ordered/Reviewed - No data to display  No orders to display     Medical Decision Making        Medical Decision Making  Remove the remaining sutures placed Steri-Strips I am going to go ahead and place the patient on some Augmentin  at this time as this may be early cellulitis we will give some nystatin  cream for the corners of the patient's mouth as well as some Mycelex  troches and also some Zovirax  cream for the 2nd area which may be a fever blister    Problems Addressed:  Angular cheilitis with candidiasis: acute illness or injury     Details: Nystatin /Mycelex   Cellulitis: acute illness or injury  Details: Augmentin  wound care    Risk  Prescription drug management.                Medications Ordered/Administered in the ED   amoxicillin -clavulanate (AUGMENTIN ) 875-125mg  per tablet (1 Tablet Oral Given 10/13/23 0001)   traMADol  (ULTRAM ) 50 mg tablet - ED To Go (2 tabs per pack) (1 Package Does not apply Patient/Family Admin 10/13/23 0001)     Clinical Impression   Encounter for removal of sutures (Primary)   Cellulitis   Angular cheilitis with candidiasis       Disposition: Discharged

## 2023-10-13 NOTE — ED Nurses Note (Signed)

## 2023-10-17 ENCOUNTER — Other Ambulatory Visit: Payer: Self-pay

## 2023-10-18 ENCOUNTER — Ambulatory Visit: Payer: Self-pay | Attending: FAMILY MEDICINE | Admitting: FAMILY MEDICINE

## 2023-10-18 ENCOUNTER — Other Ambulatory Visit: Payer: Self-pay

## 2023-10-18 ENCOUNTER — Ambulatory Visit (HOSPITAL_COMMUNITY)

## 2023-10-18 ENCOUNTER — Ambulatory Visit (INDEPENDENT_AMBULATORY_CARE_PROVIDER_SITE_OTHER): Payer: Self-pay | Admitting: FAMILY MEDICINE

## 2023-10-18 ENCOUNTER — Encounter (INDEPENDENT_AMBULATORY_CARE_PROVIDER_SITE_OTHER): Payer: Self-pay | Admitting: FAMILY MEDICINE

## 2023-10-18 ENCOUNTER — Ambulatory Visit (HOSPITAL_BASED_OUTPATIENT_CLINIC_OR_DEPARTMENT_OTHER)
Admission: RE | Admit: 2023-10-18 | Discharge: 2023-10-18 | Disposition: A | Discharge status: Home / Self Care | Source: Ambulatory Visit | Attending: FAMILY MEDICINE

## 2023-10-18 VITALS — BP 102/54 | HR 86 | Temp 98.0°F | Ht 64.0 in | Wt 189.0 lb

## 2023-10-18 DIAGNOSIS — R200 Anesthesia of skin: Secondary | ICD-10-CM | POA: Insufficient documentation

## 2023-10-18 DIAGNOSIS — Z09 Encounter for follow-up examination after completed treatment for conditions other than malignant neoplasm: Secondary | ICD-10-CM | POA: Insufficient documentation

## 2023-10-18 DIAGNOSIS — E039 Hypothyroidism, unspecified: Secondary | ICD-10-CM | POA: Insufficient documentation

## 2023-10-18 DIAGNOSIS — M150 Primary generalized (osteo)arthritis: Secondary | ICD-10-CM | POA: Insufficient documentation

## 2023-10-18 DIAGNOSIS — G8929 Other chronic pain: Secondary | ICD-10-CM | POA: Insufficient documentation

## 2023-10-18 DIAGNOSIS — M1611 Unilateral primary osteoarthritis, right hip: Secondary | ICD-10-CM

## 2023-10-18 DIAGNOSIS — Z87891 Personal history of nicotine dependence: Secondary | ICD-10-CM | POA: Insufficient documentation

## 2023-10-18 DIAGNOSIS — K7460 Unspecified cirrhosis of liver: Secondary | ICD-10-CM | POA: Insufficient documentation

## 2023-10-18 DIAGNOSIS — Z7985 Long-term (current) use of injectable non-insulin antidiabetic drugs: Secondary | ICD-10-CM | POA: Insufficient documentation

## 2023-10-18 DIAGNOSIS — G629 Polyneuropathy, unspecified: Secondary | ICD-10-CM | POA: Insufficient documentation

## 2023-10-18 DIAGNOSIS — M25571 Pain in right ankle and joints of right foot: Secondary | ICD-10-CM | POA: Insufficient documentation

## 2023-10-18 DIAGNOSIS — E1159 Type 2 diabetes mellitus with other circulatory complications: Secondary | ICD-10-CM | POA: Insufficient documentation

## 2023-10-18 DIAGNOSIS — E1142 Type 2 diabetes mellitus with diabetic polyneuropathy: Secondary | ICD-10-CM | POA: Insufficient documentation

## 2023-10-18 DIAGNOSIS — I10 Essential (primary) hypertension: Secondary | ICD-10-CM | POA: Insufficient documentation

## 2023-10-18 DIAGNOSIS — F321 Major depressive disorder, single episode, moderate: Secondary | ICD-10-CM | POA: Insufficient documentation

## 2023-10-18 DIAGNOSIS — K219 Gastro-esophageal reflux disease without esophagitis: Secondary | ICD-10-CM | POA: Insufficient documentation

## 2023-10-18 LAB — HGA1C (HEMOGLOBIN A1C WITH EST AVG GLUCOSE)
ESTIMATED AVERAGE GLUCOSE: 94 mg/dL
HEMOGLOBIN A1C: 4.9 % (ref <=5.7)

## 2023-10-18 MED ORDER — MOUNJARO 12.5 MG/0.5 ML SUBCUTANEOUS PEN INJECTOR
12.5000 mg | PEN_INJECTOR | SUBCUTANEOUS | 0 refills | Status: DC
Start: 2023-10-18 — End: 2024-01-21

## 2023-10-18 NOTE — Nursing Note (Signed)
 Body mass index is 32.44 kg/m.    Fall Risk Assessment         PHQ Questionnaire                 09/03/2022     2:57 PM   GAD-7 Questionnaire   Feeling nervous,anxious,on edge 0   Not being able to stop or control worrying 2   Worrying too much about different things 2   Trouble relaxing 0   Being so restless that it is hard to sit still 0   Becoming easily annoyed or irritable 0   Feeling afraid as if something awful might happen 0   How difficult have these problems made it for you to work, take care of things at home, or get along with other people? Somewhat difficult   Gad-7 Score Total 4   Interpretation 0-4, normal         Review Flowsheet  More data exists         08/10/2023   FUNCTIONAL HEALTH SCREENING   Because we are aware of abuse and domestic violence today, we ask all patients: Are you being hurt, hit, or frightened by anyone at your home or in your life?  N   Do you have any basic needs within your home that are not being met? (such as Food, Shelter, [employer], Tranportation, paying for bills and/or medications) N        Travel Screening       Question Response    Have you been in contact with someone who was sick? --    Do you have any of the following new or worsening symptoms? None of these    Have you traveled internationally in the last month? No          Travel History   Travel since 09/17/23    No documented travel since 09/17/23         Sharen Hones, CMA  10/18/2023, 11:35

## 2023-10-18 NOTE — Progress Notes (Signed)
 FAMILY MEDICINE, MEDICAL OFFICE BUILDING 5  [address]  [place] [address]      Operated by [facility]     Name: Angela Frey MRN:  [id number]   Date: 10/18/2023 Age: 63 y.o.      Chief Complaint   Patient presents with    ED Follow-up     Pt was in the ER due to a cut on her Right hand    Right Leg Pain       SUBJECTIVE    Patient is 63 y.o. she  presents today for follow-up on diabetes, hypertension, hyperlipidemia and GERD. She wants to have her A1c checked and she is complaining of right leg swelling and her follow up for right hand pain. She cut her hand she sliced it open she says she had 6 stitches. She says it was pretty deep and it was hurting and she already had the stitches out.   She thinks she needs to see a Engineer, petroleum she says she knows its not healing right.  Explained we need to let it heal 1st and then if she needs a Engineer, petroleum I will make the referral but since it is still healing we do not know what is going to look like until it is all healed up.  Right leg pain is getting worse her right ankle from her accident and she had 3 or 4 surgeries the surgeon is at [facility] she says he did the first ankle replacement in the [place].  She had her 1st shingles surgery another stay and then was referred to [facility] for ankle replacement she reports.  She reports it is swelling worse in the Ankle is getting bigger as she is referring to the bone a going to right ankle.  She reports it is hard to been ankle it all gets worse.  She says her right leg gets numb from the hip down. She has recent lumbar xray mild arthritis will check right hip xray.   She says she had recent lab work the gastroenterologist.  She reports that she has all these things going all with it and she does not understand she wants me to go over labs I went over the labs except for this mood well so I advised her she is going to have to talk to the gastroenterologist about that 1.  I explained why  her hemoglobin hematocrit might be low she has iron-deficiency and why her platelets might be low due to liver and iron issues and I explained PT in her INR might be out of range because of liver issues she has going on. She was also dry on BUN and this can affect renal labs numbers.   She reports she did take care of her constipation though I discussed with her that constipation can make back pain or so she needs to make sure she is not getting constipated she wants to go ahead and increase the dose of the [facility] today.  She reports she is no longer constipated and she is taking care but with what Dr. Alyson Locket gave her.  She reports her blood sugars have been doing great.  She reports she has a follow-up with Dr. Alyson Locket checking into liver issues.  She reports her blood pressures been running low.  She reports it is low the OR she reports is low at doctor's office discussed decreasing her lisinopril to the 20 mg dose and monitoring her blood pressure and if need be we can  always increase her blood pressure medicine again.    ROS:      ROS - pertinent for presenting problem.  Systems otherwise negative than what has been noted.    PAST MEDICAL HISTORY  I have reviewed and updated as appropriate the past medical, surgical, family, and social history today:    Medical History/Surgical History/Family History/Social History  Past Medical History:   Diagnosis Date    Asthma     Centrilobular emphysema (CMS HCC)     Chronic obstructive airway disease     Chronic respiratory failure with hypoxia (CMS HCC)     Hypertension     Hypothyroidism     OSA (obstructive sleep apnea)     SOB (shortness of breath)     Type 2 diabetes mellitus          Past Surgical History:   Procedure Laterality Date    HX PELVIC LAPAROSCOPY      HX TONSILLECTOMY      HX TUBAL LIGATION      HYSTEROSCOPY WITH DILATION AND CURETTAGE N/A 04/09/2021    Performed by Seward Meth, DO at [facility]    LEG SURGERY      2-3 surgeries    NECK  SURGERY      TUBOPLASTY / TUBOTUBAL ANASTOMOSIS      ULNAR NERVE REPAIR Bilateral      Family Medical History:       Problem Relation (Age of Onset)    Arthritis-osteo Mother, Father    Asthma Mother    Blood Clots Father    Congestive Heart Failure Father    Coronary Artery Disease Father    Heart Attack Father    High Cholesterol Father    Hypertension (High Blood Pressure) Mother, Father    Migraines Mother    Sleep disorders Father    Thyroid Disease Mother             Social History     Socioeconomic History    Marital status: Widowed   Tobacco Use    Smoking status: Former     Current packs/day: 0.00     Average packs/day: 1.3 packs/day for 40.0 years (50.0 ttl pk-yrs)     Types: Cigarettes     Start date: 29     Quit date: 2015     Years since quitting: 10.2    Smokeless tobacco: Never   Vaping Use    Vaping status: Never Used   Substance and Sexual Activity    Alcohol use: Never    Drug use: Never     Social Determinants of Health     Financial Resource Strain: Low Risk  (06/01/2023)    Financial Resource Strain     SDOH Financial: No   Transportation Needs: Low Risk  (06/01/2023)    Transportation Needs     SDOH Transportation: No   Social Connections: Medium Risk (06/01/2023)    Social Connections     SDOH Social Isolation: 3 to 5 times a week   Intimate Partner Violence: Low Risk  (06/01/2023)    Intimate Partner Violence     SDOH Domestic Violence: No   Housing Stability: Low Risk  (06/01/2023)    Housing Stability     SDOH Housing Situation: I have housing.     SDOH Housing Worry: No       Allergies:  Allergies   Allergen Reactions    Pseudoephedrine Hcl Hives/ Urticaria    Doxylamin-Pse-Dm-Acetaminophen  Other Adverse  Reaction (Add comment), Hives/ Urticaria and Rash    Clindamycin Phosphate     Guaifenesin  Other Adverse Reaction (Add comment)    Cleocin [Clindamycin] Hives/ Urticaria    Tessalon [Benzonatate] Hives/ Urticaria       Medications:  Current Outpatient Medications   Medication Sig     albuterol sulfate (PROVENTIL OR VENTOLIN OR PROAIR) 90 mcg/actuation Inhalation oral inhaler Take 1-2 Puffs by inhalation Every 4 hours as needed    albuterol sulfate (PROVENTIL) 2.5 mg /3 mL (0.083 %) Inhalation nebulizer solution Take 3 mL (2.5 mg total) by nebulization Every 4 hours as needed for Wheezing    amitriptyline (ELAVIL) 25 mg Oral Tablet Take 1 Tablet (25 mg total) by mouth Every night (Patient taking differently: Take 1 Tablet (25 mg total) by mouth Every night as needed)    amoxicillin-pot clavulanate (AUGMENTIN) 875-125 mg Oral Tablet Take 1 Tablet by mouth Twice daily for 10 days    aspirin 81 mg Oral Tablet, Chewable Chew 1 Tablet (81 mg total) Daily    baclofen (LIORESAL) 10 mg Oral Tablet Take 1 Tablet (10 mg total) by mouth Four times a day Indications: muscle spasms caused by a spinal disease    Blood Sugar Diagnostic (ACCU-CHEK GUIDE TEST STRIPS) Does not apply Strip 1 Strip Four times a day    Blood-Glucose Meter Misc 4 times per day    budesonide-formoteroL (SYMBICORT) 160-4.5 mcg/actuation Inhalation oral inhaler Take 2 Puffs by inhalation Twice daily    calcium carbonate-vitamin D3 (CALCIUM 600 WITH VITAMIN D3) 600 mg-12.5 mcg (500 unit) Oral Capsule Take by mouth    calcium citrate 250 mg calcium Oral Tablet Take by mouth    cholecalciferol, Vitamin D3, 125 mcg (5,000 unit) Oral Tablet Take by oral route.    clotrimazole (MYCELEX) 10 mg Mucous Membrane Troche Take 1 Troche (10 mg total) by mouth Five times a day for 14 days    cyanocobalamin (VITAMIN B 12) 1,000 mcg Oral Tablet Take 1 Tablet (1,000 mcg total) by mouth Daily    esomeprazole magnesium (NEXIUM) 40 mg Oral Capsule, Delayed Release(E.C.) TAKE 1 CAPSULE BY MOUTH IN THE MORNING BEFORE BREAKFAST    fluorouraciL (EFUDEX) 5 % Cream Apply topically    gabapentin (NEURONTIN) 600 mg Oral Tablet Take 1 Tablet (600 mg total) by mouth Three times a day    hydroCHLOROthiazide (HYDRODIURIL) 25 mg Oral Tablet Take 1 Tablet (25 mg total) by  mouth Once a day    ketoconazole (NIZORAL) 2 % Shampoo USE 1/2 TO 1 (ONE-HALF TO ONE) OUNCE OF SHAMPOO TOPICALLY THREE TIMES A WEEK    Lactobac no.41/Bifidobact no.7 (PROBIOTIC-10 ORAL) Take by mouth Once a day    Lancets (ACCU-CHEK SOFTCLIX LANCETS) Misc 1 Each Four times a day    levothyroxine (SYNTHROID) 175 mcg Oral Tablet Take 1 Tablet (175 mcg total) by mouth Every morning    lisinopriL (PRINIVIL) 40 mg Oral Tablet Take 1 Tablet (40 mg total) by mouth Once a day    meloxicam (MOBIC) 15 mg Oral Tablet Take 1 Tablet (15 mg total) by mouth Once per day as needed for Pain    nystatin (MYCOSTATIN) 100,000 unit/gram Cream Apply topically Twice daily for 14 days corners of mouth    nystatin (NYSTOP) 100,000 unit/gram Powder by Apply Topically route Three times a day as needed    pediatric multivitamins Oral Tablet, Chewable Chew 1 Tablet Daily    pravastatin (PRAVACHOL) 10 mg Oral Tablet Take 1 Tablet (10 mg total)  by mouth Every evening    sertraline (ZOLOFT) 100 mg Oral Tablet Take 1 Tablet (100 mg total) by mouth Once a day    tiotropium bromide (SPIRIVA HANDIHALER) 18 mcg Inhalation Capsule, w/Inhalation Device Take 1 Capsule (18 mcg total) by inhalation Once a day    tirzepatide (MOUNJARO) 12.5 mg/0.5 mL Subcutaneous Pen Injector Inject 0.5 mL (12.5 mg total) under the skin Every 7 days    tolterodine (DETROL LA) 4 mg Oral Capsule, Sust. Release 24 hr Take 1 Capsule (4 mg total) by mouth Daily    triamcinolone acetonide 0.1 % Cream Apply topically       Immunizations:  Immunization History   Administered Date(s) Administered    Covid-19 Vaccine,Moderna,12 Years+ 08/05/2020    Covid-19 Vaccine,Pfizer-BioNTech,Purple Top,50yrs+ 09/22/2019, 10/13/2019    DIPTH,PERTUSSIS-ACEL,TETANUS >10 YRS OLD 06/13/2012    Flu Family 05/28/2013    High-Dose Influenza Vaccine, 65+ 06/01/2011    Influenza Vaccine, 6 month-adult 06/05/2012, 05/21/2014, 05/28/2015, 05/17/2017, 05/14/2019, 06/01/2023    Tetanus Toxoid/Diphtheria  Toxoid/Acellular Pertussis Vaccine, Adsorbed 10/02/2023       OBJECTIVE    BP (!) 102/54 (Patient Position: Sitting)   Pulse 86   Temp 36.7 C (98 F)   Ht 1.626 m (5\' 4" )   Wt 85.7 kg (189 lb)   LMP  (LMP Unknown) Comment: stopped having periods at aget 44-46  SpO2 96%   BMI 32.44 kg/m       Physical Exam:  General: Pleasant, WNWD, NAD, AAO  Head: Normocephalic, AT,no lesions. Bruise on right check.    Eyes: PERL, EOM's full, conjunctivae clear, fundi grossly normal.   Nose:  no rhinnorhea noted, no lesions  Throat: Clear, no exudates, no lesions MMM and pink.  Neck: Supple, no masses, no thyromegaly, no bruits.   Chest: Lungs clear, no rales, no rhonchi, no wheezes.   Heart: RRR, no murmurs, no rubs, no gallops.   Abdomen: Soft, no tenderness, no masses, BS normal.    Extremities: Normal gait, no deformities, no edema.    Neuro: Physiological, no localizing findings.   Skin:  Normal PWD,  healing cut on right hand.   Psych: Mood and Affect normal      ASSESSMENT:        ICD-10-CM    1. Encounter for examination following treatment at hospital  Z09       2. Hepatic cirrhosis, unspecified hepatic cirrhosis type, unspecified whether ascites present (CMS HCC)  K74.60       3. Moderate major depression (CMS HCC)  F32.1       4. Diabetic polyneuropathy associated with type 2 diabetes mellitus (CMS HCC)  E11.42 tirzepatide (MOUNJARO) 12.5 mg/0.5 mL Subcutaneous Pen Injector     HGA1C (HEMOGLOBIN A1C WITH EST AVG GLUCOSE)      5. Chronic pain of right ankle  M25.571 * XR ANKLE RIGHT    G89.29       6. Leg numbness  R20.0 XR HIP RIGHT W PELVIS 2-3 VIEWS      7. Essential hypertension  I10       8. Type 2 diabetes mellitus with circulatory disorder  E11.59       9. Neuropathy (CMS HCC)  G62.9       10. Primary osteoarthritis involving multiple joints  M15.0             Body mass index is 32.44 kg/m. BMI addressed: Advised on diet, weight loss, and exercise to reduce above normal BMI.  Laboratory studies/data  reviewed  I have reviewed all available and pertinent laboratory studies, images and health maintenance.    PLAN:    Records reviewed today:  Refill needed meds. ATTACHED  Pt was educated/ counseled on the decisions made today with their involvement in these plans.  Immunizations:  Encourage COVID booster  Preventive counseling:  Continue to monitor blood sugars and blood pressures  Diet and exercise reviewed  See dentist and eye doctor with regular scheduled visits.  Active listening/Asked pertinent questions.    A total of (34) minutes was spent on this patient encounter including review of historical information, examination, documentation and post visit activities.  Follow-up with all specialists  Decreased lisinopril to 20 mg dose. Monitor BP at home if not in a meters call the office I can always increase your blood pressure medicine back up to a higher dose  Continue hydrochlorothiazide take as directed hypertension monitor BP at home and in office   Continue  levothyroxine take as directed hypothyroidism monitor lab  continue Zoloft take as directed mood continue to monitor  continue meloxicam take as directed the right is pool follow-up back scheduled follow-up with your ankle special as will  continue Nexium take as directed GERD monitor for symptom control  continue Elavil can directed week goal to monitor for symptom control  continue gabapentin take as directed pull will monitor for symptom control  Order right hip xray will call with results leg numbness   Order right ankle xray today will call with results   Reviewed labs completed 10/02/2023 with ptn today advise to call Dr. Alyson Locket also who ordered labs explained labs to patient   Order a1c will call with results   Reviewed er note 10/12/2023  Continue all prescribed medications as directed    BMI addressed: Advised on diet, weight loss, and exercise to reduce above normal BMI.    DMII - Follow a diabetic diet such as the ADA diet (American Diabetes  Association). Take all medication as directed. Exercise and loose weight because even a 10 lb weight loss can make a big difference in your blood sugar. Keep an exercise log and record your weight once a week. Keep a blood sugar log and bring this to all appointments.  Recognize signs, symptoms, and treatment of hypoglycemia (treatment:  Glucose of any form of glucose-containing carbohydrate).  Control blood pressure and in here with antihypertensive medications.  Follow a low-carbohydrate low-fat diet, especially low in saturated fats.  Lower sodium intake.  Get regular aerobic exercise with a goal of at least 150 minutes per week of moderate intensity exercises.  Maintain a healthy body weight.patient doing well on current medication will continue same dose     HTN Hypertension - Follow a low fat , low cholesterol diet. Gradually work up to 30 mins a day of exercise with a goal of exercising 3 to 6 days a week. Take all medication as directed. Follow a heart healthy diet and loose weight. BMI goal of 18.5-24.9 kg/m2. Keep a blood pressure log and bring this log to all appointments. Doing well on current dose will continue same medication     FOLLOW UP:  Return in about 3 months (around 01/17/2024) for medicare wellness exam .    Patient can return sooner if needed.      The patient/care give was given ample opportunity to ask questions and those questions were answered to his/her satisfaction. A good faith effort was made to reconcile the patient's medications.  The patient/caregiver was counseled on any appropriate vaccinations by the provider and questions were answered. The patient/care giver was told to contact me with any additional questions or concerns, or go to the ED in an emergency.       Bing Plume, DO. 10/18/23 12:31  [facility]  [facility]  [address]  [place], [place] [address]  Phone [phone]    This note may have been partially generated using M-Modal Fluency Direct system, and there may be some  incorrect words, spellings, and punctuation that were not noted in checking the note before saving.

## 2023-10-18 NOTE — Nursing Note (Signed)
 Body mass index is 32.44 kg/m.    Fall Risk Assessment         PHQ Questionnaire                 09/03/2022     2:57 PM   GAD-7 Questionnaire   Feeling nervous,anxious,on edge 0   Not being able to stop or control worrying 2   Worrying too much about different things 2   Trouble relaxing 0   Being so restless that it is hard to sit still 0   Becoming easily annoyed or irritable 0   Feeling afraid as if something awful might happen 0   How difficult have these problems made it for you to work, take care of things at home, or get along with other people? Somewhat difficult   Gad-7 Score Total 4   Interpretation 0-4, normal         Review Flowsheet  More data exists         08/10/2023   FUNCTIONAL HEALTH SCREENING   Because we are aware of abuse and domestic violence today, we ask all patients: Are you being hurt, hit, or frightened by anyone at your home or in your life?  N   Do you have any basic needs within your home that are not being met? (such as Food, Shelter, Civil Service fast streamer, Tranportation, paying for bills and/or medications) N        Travel Screening       Question Response    Have you been in contact with someone who was sick? --    Do you have any of the following new or worsening symptoms? None of these    Have you traveled internationally in the last month? No          Travel History   Travel since 09/17/23    No documented travel since 09/17/23         Sharen Hones, CMA  10/18/2023, 11:51

## 2023-10-19 DIAGNOSIS — M25571 Pain in right ankle and joints of right foot: Secondary | ICD-10-CM

## 2023-10-19 DIAGNOSIS — M7989 Other specified soft tissue disorders: Secondary | ICD-10-CM

## 2023-10-19 DIAGNOSIS — G8929 Other chronic pain: Secondary | ICD-10-CM

## 2023-10-25 ENCOUNTER — Telehealth (INDEPENDENT_AMBULATORY_CARE_PROVIDER_SITE_OTHER): Payer: Self-pay | Admitting: PULMONARY DISEASE

## 2023-10-25 NOTE — Telephone Encounter (Signed)
 Received fax from [facility] with note that patient cancelled her appointment and they have not been able to get hold of her to reschedule.     Francisco Irving, RRT/RPSGT  10/25/2023 13:10

## 2023-10-31 ENCOUNTER — Other Ambulatory Visit: Payer: Self-pay

## 2023-10-31 ENCOUNTER — Ambulatory Visit

## 2023-10-31 DIAGNOSIS — N3281 Overactive bladder: Secondary | ICD-10-CM | POA: Insufficient documentation

## 2023-11-02 LAB — URINE CULTURE: URINE CULTURE: NO GROWTH

## 2023-11-21 ENCOUNTER — Encounter (INDEPENDENT_AMBULATORY_CARE_PROVIDER_SITE_OTHER): Payer: Self-pay | Admitting: FAMILY MEDICINE

## 2023-11-21 ENCOUNTER — Other Ambulatory Visit (HOSPITAL_COMMUNITY): Payer: Self-pay | Admitting: FAMILY MEDICINE

## 2023-11-21 MED ORDER — ALBUTEROL SULFATE 2.5 MG/3 ML (0.083 %) SOLUTION FOR NEBULIZATION
2.5000 mg | INHALATION_SOLUTION | RESPIRATORY_TRACT | 0 refills | Status: DC | PRN
Start: 2023-11-21 — End: 2023-11-22

## 2023-11-21 NOTE — Telephone Encounter (Signed)
 Angela Frey stated that the albuterol  prescription needs to send that albuterol  prescription to [facility] at this phone number  [phone], but she states [place].   Just wanted to make you aware.    [name], [address] [place]

## 2023-11-21 NOTE — Telephone Encounter (Signed)
 Angela Frey called in again requesting the nebulizer solution, we explained that you were out of the office and that you would see the message when you got in the office tomorrow.  The nebulizer solution was never prescribed by you, it came from a APP a year ago.   She got hateful and wanted to know why you couldn't just send it in.  [name] explained that again you wasn't in the office and that you would see the message tomorrow.  Just wanted to document the conversation.    [name], [address] [place]

## 2023-11-21 NOTE — Telephone Encounter (Signed)
 Refill Request    Last Appt.       Future Appt.       10/18/23 11/24/23

## 2023-11-22 ENCOUNTER — Other Ambulatory Visit (INDEPENDENT_AMBULATORY_CARE_PROVIDER_SITE_OTHER): Payer: Self-pay | Admitting: FAMILY MEDICINE

## 2023-11-22 MED ORDER — ALBUTEROL SULFATE HFA 90 MCG/ACTUATION AEROSOL INHALER
1.0000 | INHALATION_SPRAY | RESPIRATORY_TRACT | 5 refills | Status: DC | PRN
Start: 2023-11-22 — End: 2024-04-05

## 2023-11-22 MED ORDER — ALBUTEROL SULFATE 2.5 MG/3 ML (0.083 %) SOLUTION FOR NEBULIZATION
2.5000 mg | INHALATION_SOLUTION | RESPIRATORY_TRACT | 0 refills | Status: DC | PRN
Start: 2023-11-22 — End: 2024-05-17

## 2023-11-24 ENCOUNTER — Encounter (INDEPENDENT_AMBULATORY_CARE_PROVIDER_SITE_OTHER): Payer: Self-pay | Admitting: FAMILY MEDICINE

## 2023-11-24 ENCOUNTER — Other Ambulatory Visit: Payer: Self-pay

## 2023-11-24 ENCOUNTER — Ambulatory Visit (HOSPITAL_COMMUNITY)

## 2023-11-24 ENCOUNTER — Ambulatory Visit (HOSPITAL_COMMUNITY)
Admission: RE | Admit: 2023-11-24 | Discharge: 2023-11-24 | Disposition: A | Discharge status: Home / Self Care | Source: Ambulatory Visit | Attending: FAMILY MEDICINE | Admitting: FAMILY MEDICINE

## 2023-11-24 ENCOUNTER — Ambulatory Visit: Payer: Self-pay | Attending: FAMILY MEDICINE | Admitting: FAMILY MEDICINE

## 2023-11-24 VITALS — BP 110/66 | HR 84 | Temp 98.2°F | Resp 18 | Ht 64.0 in | Wt 183.0 lb

## 2023-11-24 DIAGNOSIS — M150 Primary generalized (osteo)arthritis: Secondary | ICD-10-CM | POA: Insufficient documentation

## 2023-11-24 DIAGNOSIS — M25552 Pain in left hip: Secondary | ICD-10-CM | POA: Insufficient documentation

## 2023-11-24 DIAGNOSIS — R339 Retention of urine, unspecified: Secondary | ICD-10-CM | POA: Insufficient documentation

## 2023-11-24 DIAGNOSIS — R200 Anesthesia of skin: Secondary | ICD-10-CM | POA: Insufficient documentation

## 2023-11-24 DIAGNOSIS — Z967 Presence of other bone and tendon implants: Secondary | ICD-10-CM | POA: Insufficient documentation

## 2023-11-24 DIAGNOSIS — G4733 Obstructive sleep apnea (adult) (pediatric): Secondary | ICD-10-CM | POA: Insufficient documentation

## 2023-11-24 DIAGNOSIS — D649 Anemia, unspecified: Secondary | ICD-10-CM | POA: Insufficient documentation

## 2023-11-24 DIAGNOSIS — E1165 Type 2 diabetes mellitus with hyperglycemia: Secondary | ICD-10-CM | POA: Insufficient documentation

## 2023-11-24 DIAGNOSIS — G2581 Restless legs syndrome: Secondary | ICD-10-CM | POA: Insufficient documentation

## 2023-11-24 DIAGNOSIS — R202 Paresthesia of skin: Secondary | ICD-10-CM | POA: Insufficient documentation

## 2023-11-24 DIAGNOSIS — F5101 Primary insomnia: Secondary | ICD-10-CM | POA: Insufficient documentation

## 2023-11-24 DIAGNOSIS — Z0000 Encounter for general adult medical examination without abnormal findings: Secondary | ICD-10-CM | POA: Insufficient documentation

## 2023-11-24 DIAGNOSIS — M545 Low back pain, unspecified: Secondary | ICD-10-CM | POA: Insufficient documentation

## 2023-11-24 DIAGNOSIS — E119 Type 2 diabetes mellitus without complications: Secondary | ICD-10-CM | POA: Insufficient documentation

## 2023-11-24 DIAGNOSIS — K7460 Unspecified cirrhosis of liver: Secondary | ICD-10-CM | POA: Insufficient documentation

## 2023-11-24 DIAGNOSIS — K621 Rectal polyp: Secondary | ICD-10-CM | POA: Insufficient documentation

## 2023-11-24 DIAGNOSIS — M542 Cervicalgia: Secondary | ICD-10-CM | POA: Insufficient documentation

## 2023-11-24 DIAGNOSIS — I10 Essential (primary) hypertension: Secondary | ICD-10-CM | POA: Insufficient documentation

## 2023-11-24 DIAGNOSIS — J432 Centrilobular emphysema: Secondary | ICD-10-CM | POA: Insufficient documentation

## 2023-11-24 DIAGNOSIS — M79672 Pain in left foot: Secondary | ICD-10-CM | POA: Insufficient documentation

## 2023-11-24 DIAGNOSIS — G629 Polyneuropathy, unspecified: Secondary | ICD-10-CM | POA: Insufficient documentation

## 2023-11-24 DIAGNOSIS — G5603 Carpal tunnel syndrome, bilateral upper limbs: Secondary | ICD-10-CM | POA: Insufficient documentation

## 2023-11-24 DIAGNOSIS — N3946 Mixed incontinence: Secondary | ICD-10-CM | POA: Insufficient documentation

## 2023-11-24 DIAGNOSIS — F321 Major depressive disorder, single episode, moderate: Secondary | ICD-10-CM | POA: Insufficient documentation

## 2023-11-24 DIAGNOSIS — L603 Nail dystrophy: Secondary | ICD-10-CM | POA: Insufficient documentation

## 2023-11-24 DIAGNOSIS — Z7985 Long-term (current) use of injectable non-insulin antidiabetic drugs: Secondary | ICD-10-CM | POA: Insufficient documentation

## 2023-11-24 DIAGNOSIS — Z87891 Personal history of nicotine dependence: Secondary | ICD-10-CM | POA: Insufficient documentation

## 2023-11-24 DIAGNOSIS — I83813 Varicose veins of bilateral lower extremities with pain: Secondary | ICD-10-CM | POA: Insufficient documentation

## 2023-11-24 DIAGNOSIS — K635 Polyp of colon: Secondary | ICD-10-CM | POA: Insufficient documentation

## 2023-11-24 DIAGNOSIS — M79671 Pain in right foot: Secondary | ICD-10-CM | POA: Insufficient documentation

## 2023-11-24 DIAGNOSIS — J4520 Mild intermittent asthma, uncomplicated: Secondary | ICD-10-CM | POA: Insufficient documentation

## 2023-11-24 DIAGNOSIS — I6529 Occlusion and stenosis of unspecified carotid artery: Secondary | ICD-10-CM | POA: Insufficient documentation

## 2023-11-24 DIAGNOSIS — E063 Autoimmune thyroiditis: Secondary | ICD-10-CM | POA: Insufficient documentation

## 2023-11-24 DIAGNOSIS — K219 Gastro-esophageal reflux disease without esophagitis: Secondary | ICD-10-CM | POA: Insufficient documentation

## 2023-11-24 DIAGNOSIS — E1159 Type 2 diabetes mellitus with other circulatory complications: Secondary | ICD-10-CM | POA: Insufficient documentation

## 2023-11-24 DIAGNOSIS — E782 Mixed hyperlipidemia: Secondary | ICD-10-CM | POA: Insufficient documentation

## 2023-11-24 DIAGNOSIS — I700 Atherosclerosis of aorta: Secondary | ICD-10-CM | POA: Insufficient documentation

## 2023-11-24 LAB — BASIC METABOLIC PANEL
ANION GAP: 9 mmol/L (ref 4–13)
BUN/CREA RATIO: 18 (ref 6–22)
BUN: 20 mg/dL (ref 8–25)
CALCIUM: 9.3 mg/dL (ref 8.6–10.3)
CHLORIDE: 109 mmol/L (ref 96–111)
CO2 TOTAL: 28 mmol/L (ref 23–31)
CREATININE: 1.09 mg/dL — ABNORMAL HIGH (ref 0.60–1.05)
ESTIMATED GFR - FEMALE: 57 mL/min/BSA — ABNORMAL LOW (ref >=60)
GLUCOSE: 78 mg/dL (ref 65–125)
POTASSIUM: 5.2 mmol/L — ABNORMAL HIGH (ref 3.5–5.1)
SODIUM: 146 mmol/L — ABNORMAL HIGH (ref 136–145)

## 2023-11-24 LAB — VITAMIN B12: VITAMIN B 12: >2000 pg/mL — ABNORMAL HIGH (ref 200–900)

## 2023-11-24 MED ORDER — HYDROCHLOROTHIAZIDE 25 MG TABLET
25.0000 mg | ORAL_TABLET | Freq: Every day | ORAL | 1 refills | Status: DC
Start: 2023-11-24 — End: 2023-12-26

## 2023-11-24 MED ORDER — LEVOTHYROXINE 175 MCG TABLET
175.0000 ug | ORAL_TABLET | Freq: Every morning | ORAL | 1 refills | Status: DC
Start: 2023-11-24 — End: 2023-11-24

## 2023-11-24 MED ORDER — LISINOPRIL 20 MG TABLET
20.0000 mg | ORAL_TABLET | Freq: Every day | ORAL | 1 refills | Status: DC
Start: 2023-11-24 — End: 2024-02-07

## 2023-11-24 MED ORDER — LEVOTHYROXINE 175 MCG TABLET
175.0000 ug | ORAL_TABLET | Freq: Every morning | ORAL | 1 refills | Status: AC
Start: 2023-11-24 — End: ?

## 2023-11-24 MED ORDER — HYDROCHLOROTHIAZIDE 25 MG TABLET
25.0000 mg | ORAL_TABLET | Freq: Every day | ORAL | 1 refills | Status: DC
Start: 2023-11-24 — End: 2023-11-24

## 2023-11-24 MED ORDER — PRAVASTATIN 10 MG TABLET
10.0000 mg | ORAL_TABLET | Freq: Every evening | ORAL | 1 refills | Status: DC
Start: 2023-11-24 — End: 2024-04-12

## 2023-11-24 MED ORDER — LANCETS
1.0000 | Freq: Four times a day (QID) | 11 refills | Status: AC
Start: 2023-11-24 — End: ?

## 2023-11-24 MED ORDER — LANCETS
1.0000 | Freq: Four times a day (QID) | 11 refills | Status: DC
Start: 2023-11-24 — End: 2023-11-24

## 2023-11-24 MED ORDER — SERTRALINE 100 MG TABLET
100.0000 mg | ORAL_TABLET | Freq: Every day | ORAL | 1 refills | Status: DC
Start: 2023-11-24 — End: 2024-02-06

## 2023-11-24 MED ORDER — ACCU-CHEK GUIDE TEST STRIPS
1.0000 | ORAL_STRIP | Freq: Four times a day (QID) | 11 refills | Status: AC
Start: 2023-11-24 — End: ?

## 2023-11-24 MED ORDER — SERTRALINE 100 MG TABLET
100.0000 mg | ORAL_TABLET | Freq: Every day | ORAL | 1 refills | Status: DC
Start: 2023-11-24 — End: 2023-11-24

## 2023-11-24 MED ORDER — PRAVASTATIN 10 MG TABLET
10.0000 mg | ORAL_TABLET | Freq: Every evening | ORAL | 1 refills | Status: DC
Start: 2023-11-24 — End: 2023-11-24

## 2023-11-24 MED ORDER — MELOXICAM 15 MG TABLET
15.0000 mg | ORAL_TABLET | Freq: Every day | ORAL | 1 refills | Status: DC | PRN
Start: 2023-11-24 — End: 2024-03-01

## 2023-11-24 MED ORDER — LISINOPRIL 40 MG TABLET
40.0000 mg | ORAL_TABLET | Freq: Every day | ORAL | 1 refills | Status: DC
Start: 2023-11-24 — End: 2023-11-24

## 2023-11-24 NOTE — Nursing Note (Signed)
 Body mass index is 31.41 kg/m.    Fall Risk Assessment  Do you feel unsteady when standing or walking?: No  Do you worry about falling?: No  Have you fallen in the past year?: No      PHQ Questionnaire  Little interest or pleasure in doing things.: Several Days  Feeling down, depressed, or hopeless: Several Days  PHQ 2 Total: 2              09/03/2022     2:57 PM   GAD-7 Questionnaire   Feeling nervous,anxious,on edge 0   Not being able to stop or control worrying 2   Worrying too much about different things 2   Trouble relaxing 0   Being so restless that it is hard to sit still 0   Becoming easily annoyed or irritable 0   Feeling afraid as if something awful might happen 0   How difficult have these problems made it for you to work, take care of things at home, or get along with other people? Somewhat difficult   Gad-7 Score Total 4   Interpretation 0-4, normal         Review Flowsheet  More data exists         11/24/2023   FUNCTIONAL HEALTH SCREENING   Have you had a recent unexplained weight loss or gain? N   Because we are aware of abuse and domestic violence today, we ask all patients: Are you being hurt, hit, or frightened by anyone at your home or in your life?  N   In the past year, have you been afraid of your partner or ex-partner? No   Do you feel physically safe and emotionally safe where you currently live? Yes   Do you have any basic needs within your home that are not being met? (such as Food, Shelter, Civil Service fast streamer, Tranportation, paying for bills and/or medications) N   What is your housing situation? Has Housing   Are you worried about losing your housing? No   What is your current work situation? Other unemp.   In the past year, have you or any family members you live with been unable to get any of the following when it was really needed?  No   Has lack of transportation kept you from medical appointments, meetings, work, or from getting things needed for daily living?  No   How often do you see or talk  to people that you care about and feel close to? (For example: talking to friends on the phone, visiting friends or family, going to church or club meetings) >=5x a wk   Are any of these needs urgent? No        Travel Screening       Question Response    Have you been in contact with someone who was sick? --    Do you have any of the following new or worsening symptoms? None of these    Have you traveled internationally in the last month? No          Travel History   Travel since 10/25/23    No documented travel since 10/25/23         Daren Eck, CMA  11/24/2023, 14:01
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
## 2023-11-24 NOTE — Patient Instructions (Signed)
 Medicare Preventive Services  Medicare coverage information Recommendation for YOU   Heart Disease and Diabetes   Lipid profile Every 5 years or more often if at risk for cardiovascular disease     Lab Results   Component Value Date    CHOLESTEROL 151 01/08/2023    HDLCHOL 42 (L) 01/08/2023    LDLCHOL 86 01/08/2023    TRIG 126 01/08/2023           Diabetes Screening    Yearly for those at risk for diabetes, 2 tests per year for those with prediabetes Last Glucose: 144    Diabetes Self Management Training or Medical Nutrition Therapy  For those with diabetes, up to 10 hrs initial training within a year, subsequent years up to 2 hrs of follow up training Optional for those with diabetes     Medical Nutrition Therapy  Three hours of one-on-one counseling in first year, two hours in subsequent years Optional for those with diabetes, kidney disease   Intensive Behavioral Therapy for Obesity  Face-to-face counseling, first month every week, month 2-6 every other week, month 7-12 every month if continued progress is documented Optional for those with Body Mass Index 30 or higher  Your Body mass index is 31.41 kg/m.   Tobacco Cessation (Quitting) Counseling   Covers up to 8 smoking and tobacco-use cessation counseling sessions in a 36-month period.    Optional for those that use tobacco   Cancer Screening Last Completion Date   Colorectal screening   For anyone age 13 to 33 or any age if high risk:  Screening Colonoscopy every 10 yrs if low risk,  more frequent if higher risk  OR  Cologuard Stool DNA test once every 3 years OR  Fecal Occult Blood Testing yearly OR  Flexible  Sigmoidoscopy  every 5 yr OR  CT Colonography every 5 yrs      See below for due date if applicable.   Screening Pap Test   Recommended every 3 years for all women age 29 to 27, or every five years if combined with HPV test (routine screening not needed after total hysterectomy).  Medicare covers every 2 years or yearly if high risk.  Screening Pelvic  Exam   Medicare covers every 2 years, yearly if high risk or childbearing age with abnormal Pap in last 3 yrs.     See below for due date if applicable.   Screening Mammogram   Recommended every 2 years for women age 51 to 85, or more frequent if you have a higher risk. Selectively recommended for women between 40-49 based on shared decisions about risk. Covered by Medicare up to every year for women age 83 or older --05/18/2023  See below for due date if applicable.         Lung Cancer Screening  Annual low dose computed tomography (LDCT scan) is recommended for those age 70-80 who smoked 20 pack-years and are current smokers or quit smoking within past 15 years, after counseling by your doctor or nurse clinician about the possible benefits or harms.   --07/01/2020  See below for due date if applicable.   Vaccinations   Respiratory syncytial virus (RSV)  Age 36 years or older: Based on shared clinical decision-making with your provider.  Pneumococcal Vaccine  Recommended routinely age 57+ with one or two separate vaccines based on your risk. Recommended before age 37 if medical conditions with increased risk  Seasonal Influenza Vaccine  Once every flu season  Hepatitis B Vaccine  3 doses if risk (including anyone with diabetes or liver disease)  Shingles Vaccine  Two doses at age 34 or older  Diphtheria Tetanus Pertussis Vaccine  ONCE as adult, booster every 10 years     Immunization History   Administered Date(s) Administered   . Covid-19 Vaccine,Moderna,12 Years+ 08/05/2020   . Covid-19 Vaccine,Pfizer-BioNTech,Purple Top,64yrs+ 09/22/2019, 10/13/2019   . DIPTH,PERTUSSIS-ACEL,TETANUS >10 YRS OLD 06/13/2012   . Flu Family 05/28/2013   . High-Dose Influenza Vaccine, 65+ 06/01/2011   . Influenza Vaccine, 6 month-adult 06/05/2012, 05/21/2014, 05/28/2015, 05/17/2017, 05/14/2019, 06/01/2023   . Tetanus Toxoid/Diphtheria Toxoid/Acellular Pertussis Vaccine, Adsorbed 10/02/2023     Shingles vaccine and Diphtheria Tetanus  Pertussis vaccines are available at pharmacies or local health department without a prescription.   Other Preventative Screening  Last Completion Date   Bone Densitometry   Screening: All females ages 27 and older every 10 years if initial screening normal. Postmenopausal women ages 24-64 need screening with one or more risk factor: previous fracture, parental hip fracture, current smoker, low body weight, excessive alcohol use, Rheumatoid Arthritis   For women with diagnosed Osteoporosis, follow up is recommended every 2 years or a frequency recommended by your provider.     --05/13/2022  See below for due date if applicable.     Glaucoma Screening   Yearly if in high risk group such as diabetes, family history, African American age 70+ or Hispanic American age 36+   See your eye care provider for screening.   Hepatitis C Screening   Recommended  for those born between ages 18-79 years.   --10/02/2023  See below for due date if applicable.     HIV Testing  Recommended routinely at least ONCE, covered every year for age 106 to 73 regardless of risk, and every year for age over 4 who ask for the test or higher risk. Yearly or up to 3 times in pregnancy         See below for due date if applicable.   Abdominal Aortic Aneurysm Screening Ultrasound   Once with a family history of abdominal aortic aneurysms OR a female between65-75 and have smoked at least 100 cigarettes in your lifetime.         See below for due date if applicable.       Your Personalized Schedule for Preventive Tests   Health Maintenance: Pending and Last Completed       Date Due Completion Date    HIV Screening Never done ---    Hepatitis A Vaccine (1 of 2 - Risk 2-dose series) Never done ---    Pneumococcal Vaccination, Age 64+ (1 of 2 - PCV) Never done ---    Pap smear/HPV Never done ---    Shingles Vaccine (1 of 2) Never done ---    Colonoscopy Never done ---    CT Lung Cancer Screening 07/01/2021 07/01/2020    Covid-19 Vaccine (4 - 2024-25 season)  03/13/2023 08/05/2020    Diabetic Kidney Health Microalb/Cr Ratio 01/08/2024 01/08/2023    Diabetic A1C 04/18/2024 10/18/2023    Osteoporosis screening 05/13/2024 05/13/2022    Diabetic Kidney Health eGFR 10/01/2024 10/02/2023    Diabetic Retinal Exam 03/03/2025 03/04/2023    Breast Cancer Screening 05/17/2025 05/18/2023    Adult Tdap-Td (3 - Td or Tdap) 10/01/2033 10/02/2023                For Information on Advanced Directives for Health Care:  [place]:  [email or web address]  [place], [place], [place], [place] General Information: [email or web address]

## 2023-11-24 NOTE — Nursing Note (Signed)
 11/24/23 1339 11/24/23 1340   Medicare Wellness Assessment   Medicare initial or wellness physical in the last year?  --  Yes   Advance Directives   Does patient have a living will or MPOA  --  No   Advance directive information given to the patient today?  --  Patient Declined   Activities of Daily Living   Do you need help with dressing, bathing, or walking?  --  No   Do you need help with shopping, housekeeping, medications, or finances?  --  No   Do you have rugs in hallways, broken steps, or poor lighting?  --  No   Do you have grab bars in your bathroom, non-slip strips in your tub, and hand rails on your stairs?  --  No   Cognitive Function Screen   What is you age? 1  --    What is the time to the nearest hour? 1  --    What is the year? 1  --    What is the name of this clinic? 1  --    Can the patient recognize two persons (the doctor, the nurse, home help, etc.)? 1  --    What is the date of your birth? (day and month sufficient)  1  --    In what year did World War II end? 1  --    Who is the current president of the [place] ? 1  --    Count from 20 down to 1? 1  --    What address did I give you earlier? 1  --    Total Score 10  --    Depression Screen   Little interest or pleasure in doing things. 1  --    Feeling down, depressed, or hopeless 1  --    PHQ 2 Total 2  --    Substance Use Screening   In Past 12 MONTHS, how often have you used any tobacco product (for example, cigarettes, e-cigarettes, cigars, pipes, or smokeless tobacco)? Never  --    In the PAST 12 MONTHS, how often have you had 5 (men)/4 (women) or more drinks containing alcohol in one day? Never  --    In the PAST 12 months, how often have you used any prescription medications just for the feeling, more than prescribed, or that were not prescribed for you? Prescriptions may include: opioids, benzodiazepines, medications for ADHD Never  --    In the PAST 12 MONTHS, how often have you used any drugs, including marijuana, cocaine  or crack, heroin, methamphetamine, hallucinogens, ecstasy/MDMA? Never  --    Fall Risk Assessment   Do you feel unsteady when standing or walking?  --  No   Do you worry about falling?  --  No   Have you fallen in the past year?  --  No

## 2023-11-24 NOTE — Nursing Note (Signed)
 11/24/23 1300   Comprehensive Health Assessment-Adult   Do you wish to complete this form? Yes   During the past 4 weeks, how would you rate your health in general? Good   During the past 4 weeks, how much difficulty have you had doing your usual activities inside and outside your home because of medical or emotional problems? No difficulty at all   During the past 4 weeks, was someone available to help you if you needed and wanted help? Yes, some   In the past year, how many times have you gone to the emergency department or been admitted to a hospital for a health problem? 1 time   Are you generally satisfied with your sleep? Yes   Do you have enough money to buy things you need in everyday life, such as food, clothing, medicines, and housing? Yes, always   Can you get to places beyond walking distance without help?  (For example, can you drive your own car or travel alone on buses)? Yes   Do you fasten your seatbelt when you are in a car? Yes, usually   Do you exercise 20 minutes 3 or more days per week (such as walking, dancing, biking, mowing grass, swimming)? No, I usually don't exercise this much   How often do you eat food that is healthy (fruits, vegetables, lean meats) instead of unhealthy (sweets, fast food, junk food, fatty foods)? Some of the time   Have your parents, brothers or sisters had any of the following problems before the age of 29? (check all that apply) Heart problems, or hardening of the arteries;Diabetes (sugar);High cholesterol   How often do you have trouble taking medicines the eay you are told to take them? I always take them as prescribed   Do you need any help communicating with your doctors and nurses because of vision or hearing problems? No   During the past 12 months, have you experienced confusion or memory loss that is happening more often or is getting worse? No   Do you have one person you think of as your personal doctor (primary care provider or family doctor)? Yes   If  you are seeing a Primary Care Provider (PCP) or family doctor. please list their name Dr Jordis Nevins   Are you now also seeing any specialist physician(s) (such as eye doctor, foot doctor, skin doctor)? No   How confident are you that you can control or manage most of your health problems? Very confident

## 2023-11-24 NOTE — Progress Notes (Signed)
 FAMILY MEDICINE, MEDICAL OFFICE BUILDING 5  [address]  [place] [address]  Operated by [facility]  Medicare Annual Wellness Visit    Name: Angela Frey MRN:  [id number]   Date: 11/24/2023 Age: 63 y.o.     SUBJECTIVE:   Angela Frey is a 64 y.o. female for presenting for Medicare Wellness exam.   I have reviewed and reconciled the medication list with the patient today.    Comprehensive Health Assessment:  Patient verbal responses recorded in flowsheet     Medicare wellness exam today   She has lost 6 lbs.   She complains of ear issue it sound weird when she talks.  Her ear exam is normal most likely she has some seasonal allergies even though she denies seasonal allergies.  But her ear exam is normal.  She complains of numbness worse when sitting in her right leg.  She sees Dr. Diannah Forts she says she is going to call him.  I explained to her most likely she is having some issues with the lower back.  Order low back x-ray.  She says she will call Dr. Leg advise her doctor leg does not do back he does legs hips and shoulders as far as I know.  She complains of neck pain and neck popping.  She has not had a recent x-ray of her neck.  Will order cervical x-ray she would need to see an Ortho Neuro specialist if there is something going on there.  She complains needs B12 shots.  She had her CBC will order B12 checked today.  She is seeing Dr. Gorman Laughter had lot of lab work done by him she wanted me to address all his lab work I told her this is her wellness visit I do not have time to do that today but I will order her B12 shot in her x-rays.  She reports she has something wrong with her blood.  Advised her Dr. Gorman Laughter is going to see her for her liver if she has cirrhosis of the liver that is causing a lot of abnormalities and her blood work.  She reports she just got back and whole she was having an asthma attack but she is better now.  Her blood pressure is well controlled on her current  medication she is only taking 20 mg of the lisinopril  will refill lisinopril  today at 20 mg.  She is still using the mounjaro  for diabetes and weight loss.  She has lost another 6 lb she reports tolerating medication well with no side effects.  She is taking all medications as directed she reports she eats healthy.  She takes her cholesterol medicine.  She reports that Dr. Gorman Laughter is going to do an EGD for her acid reflux and look at her liver and look at her spleen.  She does not want to see the podiatrist at [facility] anymore she wants to see another podiatrist for her nail issues and for her foot issues.  I will refer her today.        11/24/2023     1:00 PM 06/01/2023     9:00 AM 03/02/2022     2:32 PM   Comprehensive Health Assessment-Adult   Do you wish to complete this form? Yes Yes No   During the past 4 weeks, how would you rate your health in general? Good Fair Good   During the past 4 weeks, how much difficulty have you had doing your usual activities inside  and outside your home because of medical or emotional problems? No difficulty at all A little bit of difficulty Some difficulty   During the past 4 weeks, was someone available to help you if you needed and wanted help? Yes, some No No   In the past year, how many times have you gone to the emergency department or been admitted to a hospital for a health problem? 1 time None None   Are you generally satisfied with your sleep? Yes Yes No   Do you have enough money to buy things you need in everyday life, such as food, clothing, medicines, and housing? Yes, always Sometimes Yes, always   Can you get to places beyond walking distance without help?  (For example, can you drive your own car or travel alone on buses)? Yes Yes Yes   Do you fasten your seatbelt when you are in a car? Yes, usually Yes, usually Yes, usually   Do you exercise 20 minutes 3 or more days per week (such as walking, dancing, biking, mowing grass, swimming)? No, I  usually don't exercise this much No, I usually don't exercise this much No, I usually don't exercise this much   How often do you eat food that is healthy (fruits, vegetables, lean meats) instead of unhealthy (sweets, fast food, junk food, fatty foods)? Some of the time Some of the time Some of the time   Have your parents, brothers or sisters had any of the following problems before the age of 29? (check all that apply) Heart problems, or hardening of the arteries;Diabetes (sugar);High cholesterol Heart problems, or hardening of the arteries;Diabetes (sugar);High cholesterol;Mental health problems such as depression, bipolar, severe anxiety, postpartum depression Diabetes (sugar);High cholesterol   How often do you have trouble taking medicines the eay you are told to take them? I always take them as prescribed I always take them as prescribed I always take them as prescribed   Do you need any help communicating with your doctors and nurses because of vision or hearing problems? No No No   During the past 12 months, have you experienced confusion or memory loss that is happening more often or is getting worse? No No No   Do you have one person you think of as your personal doctor (primary care provider or family doctor)? Yes Yes Yes   If you are seeing a Primary Care Provider (PCP) or family doctor. please list their name Dr Jordis Nevins Dr Jordis Nevins Dr. Jordis Nevins   Are you now also seeing any specialist physician(s) (such as eye doctor, foot doctor, skin doctor)? No No Yes   If you are seeing a specialist for anything such as foot, eye, skin, etc.  please list their name(s)   urology Dr. Auston Blush   How confident are you that you can control or manage most of your health problems? Very confident Very confident Very confident       I have reviewed and updated as appropriate the past medical, family and social history. 11/24/2023 as summarized below:  Past Medical History:   Diagnosis Date    Asthma     Centrilobular emphysema  (CMS HCC)     Chronic obstructive airway disease     Chronic respiratory failure with hypoxia (CMS HCC)     Hypertension     Hypothyroidism     OSA (obstructive sleep apnea)     SOB (shortness of breath)     Type 2 diabetes mellitus      Past  Surgical History:   Procedure Laterality Date    Hx pelvic laparoscopy      Hx tonsillectomy      Hx tubal ligation      Leg surgery      Neck surgery      Tuboplasty / tubotubal anastomosis      Ulnar nerve repair Bilateral      Current Outpatient Medications   Medication Sig    albuterol  sulfate (PROVENTIL  OR VENTOLIN  OR PROAIR ) 90 mcg/actuation Inhalation oral inhaler Take 1-2 Puffs by inhalation Every 4 hours as needed    albuterol  sulfate (PROVENTIL ) 2.5 mg /3 mL (0.083 %) Inhalation nebulizer solution Take 3 mL (2.5 mg total) by nebulization Every 4 hours as needed for Wheezing    amitriptyline  (ELAVIL ) 25 mg Oral Tablet Take 1 Tablet (25 mg total) by mouth Every night    aspirin 81 mg Oral Tablet, Chewable Chew 1 Tablet (81 mg total) Daily    baclofen  (LIORESAL ) 10 mg Oral Tablet Take 1 Tablet (10 mg total) by mouth Four times a day Indications: muscle spasms caused by a spinal disease    Blood Sugar Diagnostic (ACCU-CHEK GUIDE TEST STRIPS) Does not apply Strip 1 Strip Four times a day    Blood-Glucose Meter Misc 4 times per day    budesonide -formoteroL  (SYMBICORT ) 160-4.5 mcg/actuation Inhalation oral inhaler Take 2 Puffs by inhalation Twice daily    calcium carbonate-vitamin D3 (CALCIUM 600 WITH VITAMIN D3) 600 mg-12.5 mcg (500 unit) Oral Capsule Take by mouth    cholecalciferol, Vitamin D3, 125 mcg (5,000 unit) Oral Tablet Take by oral route.    cyanocobalamin (VITAMIN B 12) 1,000 mcg Oral Tablet Take 1 Tablet (1,000 mcg total) by mouth Daily    esomeprazole  magnesium  (NEXIUM ) 40 mg Oral Capsule, Delayed Release(E.C.) TAKE 1 CAPSULE BY MOUTH IN THE MORNING BEFORE BREAKFAST    FERROUS SULFATE ORAL Take by mouth    fluorouraciL (EFUDEX) 5 % Cream Apply topically     folic acid (FOLVITE) 1 mg Oral Tablet Take 1 Tablet (1 mg total) by mouth Daily    gabapentin  (NEURONTIN ) 600 mg Oral Tablet Take 1 Tablet (600 mg total) by mouth Three times a day    hydroCHLOROthiazide  (HYDRODIURIL ) 25 mg Oral Tablet Take 1 Tablet (25 mg total) by mouth Daily    ketoconazole  (NIZORAL ) 2 % Shampoo USE 1/2 TO 1 (ONE-HALF TO ONE) OUNCE OF SHAMPOO TOPICALLY THREE TIMES A WEEK    Lactobac no.41/Bifidobact no.7 (PROBIOTIC-10 ORAL) Take by mouth Once a day    Lancets (ACCU-CHEK SOFTCLIX LANCETS) Misc 1 Each Four times a day    levothyroxine  (SYNTHROID) 175 mcg Oral Tablet Take 1 Tablet (175 mcg total) by mouth Every morning    lisinopriL  (PRINIVIL ) 20 mg Oral Tablet Take 1 Tablet (20 mg total) by mouth Daily    magnesium  Oxide 420 mg Oral Tablet Take 1 Tablet (420 mg total) by mouth Daily    meloxicam  (MOBIC ) 15 mg Oral Tablet Take 1 Tablet (15 mg total) by mouth Once per day as needed for Pain    MILK THISTLE ORAL Take by mouth    nystatin  (NYSTOP ) 100,000 unit/gram Powder by Apply Topically route Three times a day as needed    pediatric multivitamins Oral Tablet, Chewable Chew 1 Tablet Daily    pravastatin  (PRAVACHOL ) 10 mg Oral Tablet Take 1 Tablet (10 mg total) by mouth Every evening    sertraline  (ZOLOFT ) 100 mg Oral Tablet Take 1 Tablet (100 mg total) by mouth  Daily    tiotropium bromide  (SPIRIVA  HANDIHALER) 18 mcg Inhalation Capsule, w/Inhalation Device Take 1 Capsule (18 mcg total) by inhalation Once a day    tirzepatide  (MOUNJARO ) 12.5 mg/0.5 mL Subcutaneous Pen Injector Inject 0.5 mL (12.5 mg total) under the skin Every 7 days    tolterodine (DETROL LA) 4 mg Oral Capsule, Sust. Release 24 hr Take 1 Capsule (4 mg total) by mouth Daily    triamcinolone acetonide 0.1 % Cream Apply topically    turmeric/turmeric ext/pepr ext (TURMERIC-TURMERIC EXT-PEPPER) 500-3 mg Oral Capsule Take by mouth     Family Medical History:       Problem Relation (Age of Onset)    Arthritis-osteo Mother, Father    Asthma  Mother    Blood Clots Father    Congestive Heart Failure Father    Coronary Artery Disease Father    Heart Attack Father    High Cholesterol Father    Hypertension (High Blood Pressure) Mother, Father    Migraines Mother    Sleep disorders Father    Thyroid  Disease Mother            Social History     Socioeconomic History    Marital status: Widowed   Tobacco Use    Smoking status: Former     Current packs/day: 0.00     Average packs/day: 1.3 packs/day for 40.0 years (50.0 ttl pk-yrs)     Types: Cigarettes     Start date: 39     Quit date: 2015     Years since quitting: 10.3    Smokeless tobacco: Never   Vaping Use    Vaping status: Never Used   Substance and Sexual Activity    Alcohol use: Never    Drug use: Never     Social Determinants of Health     Financial Resource Strain: Low Risk  (11/24/2023)    Financial Resource Strain     SDOH Financial: No   Transportation Needs: Low Risk  (11/24/2023)    Transportation Needs     SDOH Transportation: No   Social Connections: Low Risk  (11/24/2023)    Social Connections     SDOH Social Isolation: 5 or more times a week   Intimate Partner Violence: Low Risk  (11/24/2023)    Intimate Partner Violence     SDOH Domestic Violence: No   Housing Stability: Low Risk  (11/24/2023)    Housing Stability     SDOH Housing Situation: I have housing.     SDOH Housing Worry: No   Health Literacy: Low Risk  (06/01/2023)    Health Literacy     SDOH Health Literacy: Never   Employment Status: Low Risk  (11/24/2023)    Employment Status     SDOH Employment: Otherwise unemployed but not seeking work (ex. Consulting civil engineer, retired, disabled, unpaid primary care giver)         List of Current Health Care Providers   Care Team       PCP       Name Type Specialty Phone Number    Lasandra Batley, Camillia Celeste, DO Physician FAMILY MEDICINE [phone]              Care Team       Name Type Specialty Phone Number    Mindy Alu, MD Physician PULMONARY DISEASE [phone]                      Health Maintenance    Topic  Date Due    HIV Screening  Never done    Hepatitis A Vaccine (1 of 2 - Risk 2-dose series) Never done    Pneumococcal Vaccination, Age 4+ (1 of 2 - PCV) Never done    Pap smear/HPV  Never done    Shingles Vaccine (1 of 2) Never done    Colonoscopy  Never done    CT Lung Cancer Screening  07/01/2021    Covid-19 Vaccine (4 - 2024-25 season) 03/13/2023    Diabetic Kidney Health Microalb/Cr Ratio  01/08/2024    Diabetic A1C  04/18/2024    Osteoporosis screening  05/13/2024    Diabetic Kidney Health eGFR  10/01/2024    Diabetic Retinal Exam  03/03/2025    Breast Cancer Screening  05/17/2025    Adult Tdap-Td (3 - Td or Tdap) 10/01/2033    Hepatitis C screening  Completed    Influenza Vaccine  Completed    Medicare Annual Wellness Visit - Calendar Year Insurers  Completed    RSV Adult 60+ or Pregnancy  Completed     Medicare Wellness Assessment   Medicare initial or wellness physical in the last year?: Yes  Advance Directives   Does patient have a living will or MPOA: No           Advance directive information given to the patient today?: Patient Declined      Activities of Daily Living   Do you need help with dressing, bathing, or walking?: No   Do you need help with shopping, housekeeping, medications, or finances?: No   Do you have rugs in hallways, broken steps, or poor lighting?: No   Do you have grab bars in your bathroom, non-slip strips in your tub, and hand rails on your stairs?: No   Cognitive Function Screen (1=Yes, 0=No)   What is you age?: Correct   What is the time to the nearest hour?: Correct   What is the year?: Correct   What is the name of this clinic?: Correct   Can the patient recognize two persons (the doctor, the nurse, home help, etc.)?: Correct   What is the date of your birth? (day and month sufficient) : Correct   In what year did World War II end?: Correct   Who is the current president of the [place] ?: Correct   Count from 20 down to 1?: Correct   What address did I give you  earlier?: Correct   Total Score: 10       Fall Risk Screen   Do you feel unsteady when standing or walking?: No  Do you worry about falling?: No  Have you fallen in the past year?: No   Depression Screen     Little interest or pleasure in doing things.: Several Days  Feeling down, depressed, or hopeless: Several Days  PHQ 2 Total: 2     Pain Score        Substance Use-Abuse Screening     Tobacco Use     In Past 12 MONTHS, how often have you used any tobacco product (for example, cigarettes, e-cigarettes, cigars, pipes, or smokeless tobacco)?: Never     Alcohol use     In the PAST 12 MONTHS, how often have you had 5 (men)/4 (women) or more drinks containing alcohol in one day?: Never     Prescription Drug Use     In the PAST 12 months, how often have you used any prescription medications just for the feeling, more  than prescribed, or that were not prescribed for you? Prescriptions may include: opioids, benzodiazepines, medications for ADHD: Never           Illicit Drug Use   In the PAST 12 MONTHS, how often have you used any drugs, including marijuana, cocaine or crack, heroin, methamphetamine, hallucinogens, ecstasy/MDMA?: Never                 OBJECTIVE:   BP 110/66   Pulse 84   Temp 36.8 C (98.2 F)   Resp 18   Ht 1.626 m (5\' 4" )   Wt 83 kg (183 lb)   LMP  (LMP Unknown) Comment: stopped having periods at aget 44-46  SpO2 96%   BMI 31.41 kg/m        Other appropriate exam:  General: Pleasant, WNWD, NAD, AAO  Head: Normocephalic, AT,no lesions. Bruise on right check.    Eyes: PERL, EOM's full, conjunctivae clear, fundi grossly normal.   Nose:  no rhinnorhea noted, no lesions  Throat: Clear, no exudates, no lesions MMM and pink.  Neck: Supple, no masses, no thyromegaly, no bruits.   Chest: Lungs clear, no rales, no rhonchi, no wheezes.   Heart: RRR, no murmurs, no rubs, no gallops.   Abdomen: Soft, no tenderness, no masses, BS normal.    Extremities: Normal gait, no deformities, no edema.    Neuro:  Physiological, no localizing findings.   Skin:  Normal PWD,  Psych: Mood and Affect normal    Health Maintenance Due   Topic Date Due    HIV Screening  Never done    Hepatitis A Vaccine (1 of 2 - Risk 2-dose series) Never done    Pneumococcal Vaccination, Age 37+ (1 of 2 - PCV) Never done    Pap smear/HPV  Never done    Shingles Vaccine (1 of 2) Never done    Colonoscopy  Never done    CT Lung Cancer Screening  07/01/2021    Covid-19 Vaccine (4 - 2024-25 season) 03/13/2023      ASSESSMENT & PLAN:    Identified Risk Factors/ Recommended Actions     ICD-10-CM    1. Medicare annual wellness visit, subsequent  Z00.00       2. Hepatic cirrhosis, unspecified hepatic cirrhosis type, unspecified whether ascites present (CMS HCC)  K74.60       3. Neck pain  M54.2 XR CERVICAL SPINE 2 OR 3 VW      4. Numbness and tingling of leg  R20.0 XR LUMBAR SPINE AP/OBLIQUES/LAT/SPOT    R20.2       5. Moderate major depression (CMS HCC)  F32.1 sertraline  (ZOLOFT ) 100 mg Oral Tablet     DISCONTINUED: sertraline  (ZOLOFT ) 100 mg Oral Tablet      6. Uncontrolled type 2 diabetes mellitus with hyperglycemia (CMS HCC)  E11.65 pravastatin  (PRAVACHOL ) 10 mg Oral Tablet     Lancets (ACCU-CHEK SOFTCLIX LANCETS) Misc     Blood Sugar Diagnostic (ACCU-CHEK GUIDE TEST STRIPS) Does not apply Strip     DISCONTINUED: pravastatin  (PRAVACHOL ) 10 mg Oral Tablet     DISCONTINUED: Lancets (ACCU-CHEK SOFTCLIX LANCETS) Misc      7. Mixed hyperlipidemia  E78.2 pravastatin  (PRAVACHOL ) 10 mg Oral Tablet     DISCONTINUED: pravastatin  (PRAVACHOL ) 10 mg Oral Tablet      8. Lumbar pain  M54.50 meloxicam  (MOBIC ) 15 mg Oral Tablet      9. Hypothyroidism due to Hashimoto's thyroiditis  E06.3 levothyroxine  (SYNTHROID) 175 mcg Oral Tablet  DISCONTINUED: levothyroxine  (SYNTHROID) 175 mcg Oral Tablet      10. Essential hypertension  I10 lisinopriL  (PRINIVIL ) 20 mg Oral Tablet     hydroCHLOROthiazide  (HYDRODIURIL ) 25 mg Oral Tablet     DISCONTINUED: lisinopriL  (PRINIVIL ) 40  mg Oral Tablet     DISCONTINUED: hydroCHLOROthiazide  (HYDRODIURIL ) 25 mg Oral Tablet      11. Pain of left hip  M25.552       12. Neuropathy (CMS HCC)  G62.9       13. Pain in both feet  M79.671 Refer to External Provider    [phone]       14. Nail dystrophy  L60.3 Refer to External Provider      15. Anemia, unspecified type  D64.9 VITAMIN B12      16. Type 2 diabetes mellitus with circulatory disorder  E11.59       17. OSA (obstructive sleep apnea)  G47.33       18. Mild intermittent asthma without complication  J45.20       19. Abdominal aortic atherosclerosis (CMS HCC)  I70.0       20. Ex-smoker  Z87.891       21. Centriacinar emphysema (CMS HCC)  J43.2       22. Varicose veins of both lower extremities with pain  I83.813       23. Primary insomnia  F51.01       24. Carpal tunnel syndrome, bilateral  G56.03       25. RLS (restless legs syndrome)  G25.81       26. Stenosis of carotid artery, unspecified laterality  I65.29       27. Mixed stress and urge urinary incontinence  N39.46       28. Incomplete emptying of bladder  R33.9       29. Gastroesophageal reflux disease without esophagitis  K21.9       30. Polyp of sigmoid colon, unspecified type  K63.5       31. Rectal polyp  K62.1       32. Fixation hardware in lower extremity  Z96.7       33. Primary osteoarthritis involving multiple joints  M15.0                      Patient declined Advanced Directives information.        Orders Placed This Encounter    XR CERVICAL SPINE 2 OR 3 VW    XR LUMBAR SPINE AP/OBLIQUES/LAT/SPOT    VITAMIN B12    Refer to External Provider    meloxicam  (MOBIC ) 15 mg Oral Tablet    lisinopriL  (PRINIVIL ) 20 mg Oral Tablet    pravastatin  (PRAVACHOL ) 10 mg Oral Tablet    sertraline  (ZOLOFT ) 100 mg Oral Tablet    Lancets (ACCU-CHEK SOFTCLIX LANCETS) Misc    levothyroxine  (SYNTHROID) 175 mcg Oral Tablet    Blood Sugar Diagnostic (ACCU-CHEK GUIDE TEST STRIPS) Does not apply Strip    hydroCHLOROthiazide  (HYDRODIURIL ) 25 mg Oral Tablet           The patient has been educated about risk factors and recommended preventive care. Written Prevention Plan completed/ updated and given to patient (see After Visit Summary).      Medicare wellness exam today   Keep follow up with Dr. Gorman Laughter as scheduled for liver and EGD and enlarged spleen.   Order lumbar xray back and leg pain will call with results   Reviewed lumbar mri 2021  Order cervical xray will  call with results neck pain and popping  Refer to podiatry for evaluation of toe nail, foot and ankle pain  Refill Mobic  take as directed arthritis pain recheck at next appointment for control of arthritis pull  Refill lisinopril  take as directed hypertension recheck blood pressure at next appointment in office continue to monitor blood pressure at home  Refill pravastatin  use as directed hyperlipidemia continue healthy diet weight loss exercise monitor lipids  Refill Zoloft  take as directed anxiety mood and depression stable currently recheck for symptom control at next appointment  Refill Lantus uses directed diabetes type 2 to monitor glucose  Refill levothyroxine  take as directed hypothyroidism he is already labs  Order test strips to monitor glucose for diabetes type 2  Refill hydrochlorothiazide  take as directed hypertension recheck blood pressure next appointment continue to monitor blood pressure at home follow-up with all specialists as scheduled  Continue all medication as directed     Depression screening is negative. Follow up plan of care: Follow up continue current medication as directed and recheck for symptoms of depression if symptoms present will address at that appt .    BMI addressed: Advised on diet, weight loss, and exercise to reduce above normal BMI.    Fall Risk Follow up plan of care: Discussed optimizing home safety    A total of (47) minutes was spent on this patient encounter including review of historical information, examination, documentation and post visit activities.      The  patient/care give was given ample opportunity to ask questions and those questions were answered to his/her satisfaction. A good faith effort was made to reconcile the patient's medications. The patient/caregiver was counseled on any appropriate vaccinations by the provider and questions were answered. The patient/care giver was told to contact me with any additional questions or concerns, or go to the ED in an emergency.     Return in about 25 weeks (around 05/17/2024) for chronic medical conditions .    Johntavius Shepard K Shaquill Iseman, DO

## 2023-11-25 ENCOUNTER — Ambulatory Visit (INDEPENDENT_AMBULATORY_CARE_PROVIDER_SITE_OTHER): Payer: Self-pay | Admitting: FAMILY MEDICINE

## 2023-11-25 DIAGNOSIS — M47812 Spondylosis without myelopathy or radiculopathy, cervical region: Secondary | ICD-10-CM

## 2023-11-25 DIAGNOSIS — M50322 Other cervical disc degeneration at C5-C6 level: Secondary | ICD-10-CM

## 2023-11-25 DIAGNOSIS — M47816 Spondylosis without myelopathy or radiculopathy, lumbar region: Secondary | ICD-10-CM

## 2023-11-25 DIAGNOSIS — R200 Anesthesia of skin: Secondary | ICD-10-CM

## 2023-11-25 DIAGNOSIS — R202 Paresthesia of skin: Secondary | ICD-10-CM

## 2023-11-29 ENCOUNTER — Encounter (INDEPENDENT_AMBULATORY_CARE_PROVIDER_SITE_OTHER): Payer: Self-pay

## 2023-12-06 ENCOUNTER — Encounter (INDEPENDENT_AMBULATORY_CARE_PROVIDER_SITE_OTHER): Payer: Self-pay | Admitting: FAMILY MEDICINE

## 2023-12-07 ENCOUNTER — Other Ambulatory Visit (INDEPENDENT_AMBULATORY_CARE_PROVIDER_SITE_OTHER): Payer: Self-pay | Admitting: FAMILY MEDICINE

## 2023-12-07 DIAGNOSIS — R300 Dysuria: Secondary | ICD-10-CM

## 2023-12-07 DIAGNOSIS — G629 Polyneuropathy, unspecified: Secondary | ICD-10-CM

## 2023-12-07 DIAGNOSIS — M25552 Pain in left hip: Secondary | ICD-10-CM

## 2023-12-07 MED ORDER — GABAPENTIN 600 MG TABLET
600.0000 mg | ORAL_TABLET | Freq: Three times a day (TID) | ORAL | 1 refills | Status: DC
Start: 2023-12-07 — End: 2023-12-30

## 2023-12-07 NOTE — Telephone Encounter (Signed)
 Refill Request    Last Appt.       Future Appt.       11/24/23 05/25/24

## 2023-12-07 NOTE — Telephone Encounter (Signed)
 This medication has to be sent to [facility]. Pt is not allowed to get prescriptions filled at [facility] any more.    Daren Eck, CMA

## 2023-12-14 ENCOUNTER — Other Ambulatory Visit (INDEPENDENT_AMBULATORY_CARE_PROVIDER_SITE_OTHER): Payer: Self-pay | Admitting: FAMILY MEDICINE

## 2023-12-14 DIAGNOSIS — K219 Gastro-esophageal reflux disease without esophagitis: Secondary | ICD-10-CM

## 2023-12-19 ENCOUNTER — Other Ambulatory Visit: Payer: Self-pay

## 2023-12-20 ENCOUNTER — Encounter (HOSPITAL_BASED_OUTPATIENT_CLINIC_OR_DEPARTMENT_OTHER): Payer: Self-pay | Admitting: Obstetrics & Gynecology

## 2023-12-20 ENCOUNTER — Other Ambulatory Visit (HOSPITAL_COMMUNITY)

## 2023-12-20 ENCOUNTER — Other Ambulatory Visit: Payer: Self-pay

## 2023-12-20 ENCOUNTER — Ambulatory Visit: Payer: Self-pay | Attending: Obstetrics & Gynecology | Admitting: Obstetrics & Gynecology

## 2023-12-20 VITALS — BP 110/62 | HR 92 | Temp 96.8°F | Resp 18 | Ht 64.0 in | Wt 183.0 lb

## 2023-12-20 DIAGNOSIS — Z01411 Encounter for gynecological examination (general) (routine) with abnormal findings: Secondary | ICD-10-CM | POA: Insufficient documentation

## 2023-12-20 DIAGNOSIS — N952 Postmenopausal atrophic vaginitis: Secondary | ICD-10-CM | POA: Insufficient documentation

## 2023-12-20 DIAGNOSIS — N950 Postmenopausal bleeding: Secondary | ICD-10-CM | POA: Insufficient documentation

## 2023-12-20 DIAGNOSIS — Z1272 Encounter for screening for malignant neoplasm of vagina: Secondary | ICD-10-CM

## 2023-12-20 NOTE — Progress Notes (Signed)
 MEDICAL OFFICE BUILDING 3 [facility], MEDICAL OFFICE BUILDING 3  [address]  [place] [address]  [phone]    Angela Frey 12/20/2023   [id number] 03-Jan-1961     Chief Complaint: Annual Exam (Patient presents today for annual exam and post menopausal bleeding. )      Subjective: Angela Frey is a 63 y.o. female presenting for annual WWE and evaluation of PMB.    Medical History   I have reviewed and updated as appropriate the past medical, family and social history today:    Medical History/Surgical History/Family History  Past Medical History:   Diagnosis Date    Asthma     Centrilobular emphysema (CMS HCC)     Chronic obstructive airway disease     Chronic respiratory failure with hypoxia (CMS HCC)     Hypertension     Hypothyroidism     OSA (obstructive sleep apnea)     SOB (shortness of breath)     Type 2 diabetes mellitus           Past Surgical History:   Procedure Laterality Date    HX PELVIC LAPAROSCOPY      HX TONSILLECTOMY      HX TUBAL LIGATION      LEG SURGERY      2-3 surgeries    NECK SURGERY      TUBOPLASTY / TUBOTUBAL ANASTOMOSIS      ULNAR NERVE REPAIR Bilateral           Family Medical History:       Problem Relation (Age of Onset)    Arthritis-osteo Mother, Father    Asthma Mother    Blood Clots Father    Congestive Heart Failure Father    Coronary Artery Disease Father    Heart Attack Father    High Cholesterol Father    Hypertension (High Blood Pressure) Mother, Father    Migraines Mother    Sleep disorders Father    Thyroid  Disease Mother             Menstrual History    Total time span for birth control pills      Use of HRT (how long total use)      Use of Infertility Treatments      Age of Menarche      Avg. frequency (days)      Duration (days)      Flow      Pattern      Age of Menopause         OB History   Gravida Para Term Preterm AB Living   2 2 2   2    SAB IAB Ectopic Multiple Live Births             # Outcome Date GA Lbr Len/2nd Weight Sex Type Anes PTL Lv    2 Term            1 Term              Social History     Socioeconomic History    Marital status: Widowed   Tobacco Use    Smoking status: Former     Current packs/day: 0.00     Average packs/day: 1.3 packs/day for 40.0 years (50.0 ttl pk-yrs)     Types: Cigarettes     Start date: 18     Quit date: 2015     Years since quitting: 10.4    Smokeless tobacco: Never  Vaping Use    Vaping status: Never Used   Substance and Sexual Activity    Alcohol use: Never    Drug use: Never    Sexual activity: Not Currently     Birth control/protection: Post-menopausal     Social Determinants of Health     Financial Resource Strain: Low Risk  (11/24/2023)    Financial Resource Strain     SDOH Financial: No   Transportation Needs: Low Risk  (11/24/2023)    Transportation Needs     SDOH Transportation: No   Social Connections: Low Risk  (11/24/2023)    Social Connections     SDOH Social Isolation: 5 or more times a week   Intimate Partner Violence: Low Risk  (11/24/2023)    Intimate Partner Violence     SDOH Domestic Violence: No   Housing Stability: Low Risk  (11/24/2023)    Housing Stability     SDOH Housing Situation: I have housing.     SDOH Housing Worry: No     Expanded Substance History       Allergies:  Allergies[1]  Medications:  Current Outpatient Medications   Medication Sig    albuterol  sulfate (PROVENTIL  OR VENTOLIN  OR PROAIR ) 90 mcg/actuation Inhalation oral inhaler Take 1-2 Puffs by inhalation Every 4 hours as needed    albuterol  sulfate (PROVENTIL ) 2.5 mg /3 mL (0.083 %) Inhalation nebulizer solution Take 3 mL (2.5 mg total) by nebulization Every 4 hours as needed for Wheezing    amitriptyline  (ELAVIL ) 25 mg Oral Tablet Take 1 Tablet (25 mg total) by mouth Every night    aspirin 81 mg Oral Tablet, Chewable Chew 1 Tablet (81 mg total) Daily    baclofen  (LIORESAL ) 10 mg Oral Tablet Take 1 Tablet (10 mg total) by mouth Four times a day Indications: muscle spasms caused by a spinal disease    Blood Sugar Diagnostic  (ACCU-CHEK GUIDE TEST STRIPS) Does not apply Strip 1 Strip Four times a day    Blood-Glucose Meter Misc 4 times per day    budesonide -formoteroL  (SYMBICORT ) 160-4.5 mcg/actuation Inhalation oral inhaler Take 2 Puffs by inhalation Twice daily    calcium carbonate-vitamin D3 (CALCIUM 600 WITH VITAMIN D3) 600 mg-12.5 mcg (500 unit) Oral Capsule Take by mouth    cholecalciferol, Vitamin D3, 125 mcg (5,000 unit) Oral Tablet Take by oral route.    cyanocobalamin (VITAMIN B 12) 1,000 mcg Oral Tablet Take 1 Tablet (1,000 mcg total) by mouth Daily    esomeprazole  magnesium  (NEXIUM ) 40 mg Oral Capsule, Delayed Release(E.C.) TAKE 1 CAPSULE BY MOUTH IN THE MORNING BEFORE BREAKFAST    fluorouraciL (EFUDEX) 5 % Cream Apply topically    folic acid (FOLVITE) 1 mg Oral Tablet Take 1 Tablet (1 mg total) by mouth Daily    gabapentin  (NEURONTIN ) 600 mg Oral Tablet Take 1 Tablet (600 mg total) by mouth Three times a day    hydroCHLOROthiazide  (HYDRODIURIL ) 25 mg Oral Tablet Take 1 tablet by mouth once daily    ketoconazole  (NIZORAL ) 2 % Shampoo USE 1/2 TO 1 (ONE-HALF TO ONE) OUNCE OF SHAMPOO TOPICALLY THREE TIMES A WEEK    Lactobac no.41/Bifidobact no.7 (PROBIOTIC-10 ORAL) Take by mouth Once a day    Lancets (ACCU-CHEK SOFTCLIX LANCETS) Misc 1 Each Four times a day    levothyroxine  (SYNTHROID) 175 mcg Oral Tablet Take 1 Tablet (175 mcg total) by mouth Every morning    lisinopriL  (PRINIVIL ) 20 mg Oral Tablet Take 1 Tablet (20 mg total) by mouth Daily  magnesium  Oxide 420 mg Oral Tablet Take 1 Tablet (420 mg total) by mouth Daily    meloxicam  (MOBIC ) 15 mg Oral Tablet Take 1 Tablet (15 mg total) by mouth Once per day as needed for Pain    MILK THISTLE ORAL Take by mouth    nystatin  (NYSTOP ) 100,000 unit/gram Powder by Apply Topically route Three times a day as needed    pediatric multivitamins Oral Tablet, Chewable Chew 1 Tablet Daily    pravastatin  (PRAVACHOL ) 10 mg Oral Tablet Take 1 Tablet (10 mg total) by mouth Every evening     sertraline  (ZOLOFT ) 100 mg Oral Tablet Take 1 Tablet (100 mg total) by mouth Daily    tiotropium bromide  (SPIRIVA  HANDIHALER) 18 mcg Inhalation Capsule, w/Inhalation Device Take 1 Capsule (18 mcg total) by inhalation Once a day    tirzepatide  (MOUNJARO ) 12.5 mg/0.5 mL Subcutaneous Pen Injector Inject 0.5 mL (12.5 mg total) under the skin Every 7 days    triamcinolone acetonide 0.1 % Cream Apply topically    turmeric/turmeric ext/pepr ext (TURMERIC-TURMERIC EXT-PEPPER) 500-3 mg Oral Capsule Take by mouth    vitamin E 400 unit Oral Capsule Take 1 Capsule (400 Units total) by mouth Daily     Immunization History:  Immunization History   Administered Date(s) Administered    Covid-19 Vaccine,Moderna,12 Years+ 08/05/2020    Covid-19 Vaccine,Pfizer-BioNTech,Purple Top,66yrs+ 09/22/2019, 10/13/2019    DIPTH,PERTUSSIS-ACEL,TETANUS >10 YRS OLD 06/13/2012    Flu Family 05/28/2013    High-Dose Influenza Vaccine, 65+ 06/01/2011    Influenza Vaccine, 6 month-adult 06/05/2012, 05/21/2014, 05/28/2015, 05/17/2017, 05/14/2019, 06/01/2023    Tetanus Toxoid/Diphtheria Toxoid/Acellular Pertussis Vaccine, Adsorbed 10/02/2023     Problem List:  Patient Active Problem List    Diagnosis Date Noted    Hepatic cirrhosis, unspecified hepatic cirrhosis type, unspecified whether ascites present (CMS HCC) 06/01/2023    Diabetic polyneuropathy associated with type 2 diabetes mellitus (CMS HCC) 05/01/2023    Obesity 03/10/2023    Benign neoplasm of ascending colon 12/02/2022    Type 2 diabetes mellitus with circulatory disorder 12/02/2022    Polyp of sigmoid colon 12/02/2022    Stenosis of carotid artery 12/02/2022    Left hip pain 11/15/2022    Varicose veins of both lower extremities 09/03/2022    Depression screening negative 03/02/2022    Osteoarthritis of left shoulder 02/26/2022    Family history of kidney cancer 10/21/2021    PMB (postmenopausal bleeding) 04/01/2021    Full thickness rotator cuff tear 01/09/2021    Abdominal aortic  atherosclerosis (CMS HCC) 07/09/2020    Mixed stress and urge urinary incontinence 02/28/2020    Essential hypertension 02/20/2020    Hypothyroidism due to Hashimoto's thyroiditis 02/20/2020    Mild intermittent asthma without complication 02/20/2020    Moderate major depression (CMS HCC) 02/20/2020    Gastroesophageal reflux disease without esophagitis 02/20/2020    Mixed hyperlipidemia 02/20/2020    Primary insomnia 02/20/2020    OSA (obstructive sleep apnea) 03/07/2018    Carpal tunnel syndrome, bilateral 11/22/2017    Chronic neck pain 01/24/2017    Osteoarthritis of multiple joints 10/12/2016    Family history of vitamin B12 deficiency 10/01/2016    GAD (generalized anxiety disorder) 10/01/2016    Recurrent major depressive episodes, moderate (CMS HCC) 10/01/2016    Arthropathy 06/16/2016    History of colonic polyps 06/16/2016    Rectal polyp 06/16/2016    Allergic rhinitis, seasonal 02/09/2016    At risk for injury 02/09/2016    Compulsive tobacco user syndrome  02/09/2016    FOM (frequency of micturition) 02/09/2016    Mononeuropathy of upper extremity 02/09/2016    RLS (restless legs syndrome) 02/09/2016    Centriacinar emphysema (CMS HCC) 05/28/2015    Chronic fatigue 05/28/2015    Neuropathy (CMS HCC) 05/28/2015    Incomplete emptying of bladder 11/11/2014    Fixation hardware in lower extremity 04/23/2014    History of restless legs syndrome 04/22/2014    Ex-smoker 08/13/2013    Cubital tunnel syndrome 08/20/2011    Incomplete uterovaginal prolapse 06/08/2011    Accessory skin tags 06/01/2011       Review of Systems:  ROS neg except as mentioned in HPI.    Objective   Vitals:  Vitals:    12/20/23 1121   BP: 110/62   Pulse: 92   Resp: 18   Temp: 36 C (96.8 F)   SpO2: 94%   Weight: 83 kg (183 lb)   Height: 1.626 m (5' 4)   BMI: 31.41      Height: 162.6 cm (5' 4)  Wt Readings from Last 5 Encounters:   12/20/23 83 kg (183 lb)   11/24/23 83 kg (183 lb)   10/18/23 85.7 kg (189 lb)   10/12/23 85.3 kg (188  lb)   10/01/23 86.2 kg (190 lb)         Physical Exam:  General Appearance: appears in good health, comfortable, mildly obese, appears stated age, no distress, and vital signs reviewed  Head: NCAT  Eyes: lids and lashes normal, conjunctivae and sclerae normal and pupils equal, EOM intact  Neck: No lymphadenopathy. No thyromegaly or thyroid  mass.  Cardiovascular: Regular rate and rhythm with no murmurs, gallops or rubs.  Pulmonary: Lungs clear to auscultation in all fields bilaterally. Good air movement.  Abdomen: Soft, non-tender, non-distended, no massses or organomegaly  Extremities: No clubbing, cyanosis, or edema. and  No joint effusion.  Skin: Skin warm and dry and No rashes  Musculoskeletal: inspection is normal  Lymphatics: No lymphadenopathy of cervical, axillary or inguinal lymph nodes  Psychiatric: Normal affect, behavior, memory, thought content, judgement, and speech.  Neurology: normal without focal findings  mental status, speech normal, alert and oriented x iii    Breasts: Bilateral Inspection negative. Symmetrical. No nipple discharge or bleeding. No masses or nodularity palpable, no palpable axillary lymphadenopathy, No fibrous tissue.    Pelvic: Vulva without lesions, pink, NT   Urethra without lesions, NT   Bladder NT  Vaginal pink, atrophic, dry, no lesions, NT   Cervix smooth, no lesions, no d/c, NT, pink   Uterus normal size, mobile, nontender   Adenexa normal size, NT, no masses  Anus without lesions, good tone  Pelvis without masses  Rectal: Deferred.           Laboratory Studies/Data Reviewed    Nursing Notes:   Eden Goodpasture  12/20/23 1130  Signed  Body mass index is 31.41 kg/m.    Review Flowsheet  More data exists         12/20/2023   FUNCTIONAL HEALTH SCREENING   Because we are aware of abuse and domestic violence today, we ask all patients: Are you being hurt, hit, or frightened by anyone at your home or in your life?  N   Do you have any basic needs within your home that are  not being met? (such as Food, Shelter, [employer] fast streamer, Tranportation, paying for bills and/or medications) N     Fall Risk Assessment  Do you feel unsteady  when standing or walking?: No  Do you worry about falling?: No  Have you fallen in the past year?: No    PHQ Questionnaire  Little interest or pleasure in doing things.: Not at all  Feeling down, depressed, or hopeless: Not at all  PHQ 2 Total: 0         Travel Screening       Question Response    Have you been in contact with someone who was sick? --    Do you have any of the following new or worsening symptoms? None of these    Have you traveled internationally in the last month? No          Travel History   Travel since 11/19/23    No documented travel since 11/19/23             09/03/2022     2:57 PM   GAD-7 Questionnaire   Feeling nervous,anxious,on edge 0   Not being able to stop or control worrying 2   Worrying too much about different things 2   Trouble relaxing 0   Being so restless that it is hard to sit still 0   Becoming easily annoyed or irritable 0   Feeling afraid as if something awful might happen 0   How difficult have these problems made it for you to work, take care of things at home, or get along with other people? Somewhat difficult   Gad-7 Score Total 4   Interpretation 0-4, normal            No data to display                Eden Goodpasture  12/20/2023, 11:29   Travel Screening       Question Response    Have you been in contact with someone who was sick? --    Do you have any of the following new or worsening symptoms? None of these    Have you traveled internationally in the last month? No          Travel History   Travel since 11/19/23    No documented travel since 11/19/23             Results for orders placed or performed in visit on 12/20/23 (from the past 4 weeks)   THIN PREP PAP REFLEX TO HPV MRNA IF ASCUS ([facility])    Collection Time: 12/20/23  5:27 PM   Result Value Ref Range    REPORT STATUS: SEE BELOW     SOURCE: SEE BELOW      CLINICAL INFORMATION: SEE BELOW     LMP: SEE BELOW     INFECTION: SEE BELOW     STATEMENT OF ADEQUACY: SEE BELOW     INTERPRETATION/RESULT: SEE BELOW     COMMENT: NO RESULT     CYTOTECHNOLOGIST: SEE BELOW     PREV. PAP: NO RESULT     PREV. BX: NO RESULT     REVIEW CYTOTECHNOLOGIST: NO RESULT     PATHOLOGIST: NO RESULT     GENERAL CATEGORIZATION: SEE BELOW         No results found for this or any previous visit (from the past 720 hours).     Assessment/Plan   Diagnosis    ICD-10-CM    1. PMB (postmenopausal bleeding)  N95.0 US  PELVIS NON OB TRANSVAGINAL      2. Screening for vaginal cancer  Z12.72 THIN PREP PAP REFLEX TO  HPV MRNA IF ASCUS ([facility])      3. Vaginal atrophy  N95.2           Plan  Pap performed  US  ordered    Return for Follow up after testing.      Daliya Parchment E Dalyn Kjos, DO         [1]   Allergies  Allergen Reactions    Pseudoephedrine Hcl Hives/ Urticaria    Doxylamin-Pse-Dm-Acetaminophen   Other Adverse Reaction (Add comment), Hives/ Urticaria and Rash    Clindamycin Phosphate     Guaifenesin   Other Adverse Reaction (Add comment)    Cleocin [Clindamycin] Hives/ Urticaria    Tessalon [Benzonatate] Hives/ Urticaria

## 2023-12-20 NOTE — Nursing Note (Signed)
 Body mass index is 31.41 kg/m.    Review Flowsheet  More data exists         12/20/2023   FUNCTIONAL HEALTH SCREENING   Because we are aware of abuse and domestic violence today, we ask all patients: Are you being hurt, hit, or frightened by anyone at your home or in your life?  N   Do you have any basic needs within your home that are not being met? (such as Food, Shelter, [employer] fast streamer, Tranportation, paying for bills and/or medications) N     Fall Risk Assessment  Do you feel unsteady when standing or walking?: No  Do you worry about falling?: No  Have you fallen in the past year?: No    PHQ Questionnaire  Little interest or pleasure in doing things.: Not at all  Feeling down, depressed, or hopeless: Not at all  PHQ 2 Total: 0         Travel Screening       Question Response    Have you been in contact with someone who was sick? --    Do you have any of the following new or worsening symptoms? None of these    Have you traveled internationally in the last month? No          Travel History   Travel since 11/19/23    No documented travel since 11/19/23             09/03/2022     2:57 PM   GAD-7 Questionnaire   Feeling nervous,anxious,on edge 0   Not being able to stop or control worrying 2   Worrying too much about different things 2   Trouble relaxing 0   Being so restless that it is hard to sit still 0   Becoming easily annoyed or irritable 0   Feeling afraid as if something awful might happen 0   How difficult have these problems made it for you to work, take care of things at home, or get along with other people? Somewhat difficult   Gad-7 Score Total 4   Interpretation 0-4, normal            No data to display                Eden Goodpasture  12/20/2023, 11:29   Travel Screening       Question Response    Have you been in contact with someone who was sick? --    Do you have any of the following new or worsening symptoms? None of these    Have you traveled internationally in the last month? No          Travel  History   Travel since 11/19/23    No documented travel since 11/19/23

## 2023-12-24 LAB — THIN PREP PAP REFLEX TO HPV MRNA IF ASCUS (QUEST)

## 2023-12-25 ENCOUNTER — Other Ambulatory Visit (INDEPENDENT_AMBULATORY_CARE_PROVIDER_SITE_OTHER): Payer: Self-pay | Admitting: FAMILY MEDICINE

## 2023-12-25 DIAGNOSIS — I10 Essential (primary) hypertension: Secondary | ICD-10-CM

## 2023-12-26 ENCOUNTER — Other Ambulatory Visit: Payer: Self-pay

## 2023-12-26 NOTE — Telephone Encounter (Signed)
 Derrick at [facility] and stated they received a script for Kianna today but she is not allowed to fill her medication there anymore.      [name], [address]

## 2023-12-27 ENCOUNTER — Ambulatory Visit (INDEPENDENT_AMBULATORY_CARE_PROVIDER_SITE_OTHER): Payer: Self-pay | Admitting: FAMILY MEDICINE

## 2023-12-27 ENCOUNTER — Ambulatory Visit
Admission: RE | Admit: 2023-12-27 | Discharge: 2023-12-27 | Disposition: A | Discharge status: Home / Self Care | Payer: Self-pay | Source: Ambulatory Visit | Attending: Obstetrics & Gynecology | Admitting: Obstetrics & Gynecology

## 2023-12-27 ENCOUNTER — Ambulatory Visit (HOSPITAL_COMMUNITY)

## 2023-12-27 ENCOUNTER — Other Ambulatory Visit (INDEPENDENT_AMBULATORY_CARE_PROVIDER_SITE_OTHER): Payer: Self-pay | Admitting: FAMILY MEDICINE

## 2023-12-27 DIAGNOSIS — N390 Urinary tract infection, site not specified: Secondary | ICD-10-CM

## 2023-12-27 DIAGNOSIS — N950 Postmenopausal bleeding: Secondary | ICD-10-CM | POA: Insufficient documentation

## 2023-12-27 DIAGNOSIS — R300 Dysuria: Secondary | ICD-10-CM | POA: Insufficient documentation

## 2023-12-27 LAB — URINALYSIS, MICROSCOPIC

## 2023-12-27 LAB — URINALYSIS, MACRO/MICRO
BILIRUBIN: NEGATIVE mg/dL
BLOOD: NEGATIVE mg/dL
GLUCOSE: NEGATIVE mg/dL
KETONES: NEGATIVE mg/dL
NITRITE: POSITIVE — AB
PH: 7 (ref 4.6–8.0)
PROTEIN: NEGATIVE mg/dL
SPECIFIC GRAVITY: 1.015 (ref 1.003–1.035)
UROBILINOGEN: 0.2 mg/dL (ref 0.2–1.0)

## 2023-12-27 MED ORDER — SULFAMETHOXAZOLE 800 MG-TRIMETHOPRIM 160 MG TABLET
1.0000 | ORAL_TABLET | Freq: Two times a day (BID) | ORAL | 0 refills | Status: DC
Start: 2023-12-27 — End: 2024-02-06

## 2023-12-29 LAB — URINE CULTURE,ROUTINE: URINE CULTURE: >100000 — AB

## 2023-12-30 ENCOUNTER — Telehealth (INDEPENDENT_AMBULATORY_CARE_PROVIDER_SITE_OTHER): Payer: Self-pay | Admitting: FAMILY MEDICINE

## 2023-12-30 ENCOUNTER — Other Ambulatory Visit (INDEPENDENT_AMBULATORY_CARE_PROVIDER_SITE_OTHER): Payer: Self-pay | Admitting: FAMILY MEDICINE

## 2023-12-30 DIAGNOSIS — E1159 Type 2 diabetes mellitus with other circulatory complications: Secondary | ICD-10-CM

## 2023-12-30 DIAGNOSIS — M25552 Pain in left hip: Secondary | ICD-10-CM

## 2023-12-30 DIAGNOSIS — G629 Polyneuropathy, unspecified: Secondary | ICD-10-CM

## 2023-12-30 MED ORDER — GABAPENTIN 600 MG TABLET: 600.0000 mg | ORAL_TABLET | Freq: Three times a day (TID) | ORAL | 1 refills | Status: DC

## 2023-12-30 NOTE — Telephone Encounter (Signed)
 Pt called in and asked for PCP to order her labs for her to complete including her A1c. Pt also asked a script for gabapentin  to be sent to [facility] pharmacy because the last one was sent to [facility]. Pt also stated that the pharmacy filled the wrong lancets and advised her that's what PCP sent in. Pt is asking if PCP cand send in a script for the Accu-Chek FastClix lancets to [facility].   Pt also asked if PCP can send in 90 day scripts to [facility] pharmacy, that way she doesn't have to pick up every month. Pt asked for a return call back.    Comments added by Boyd Barrier on 12/30/23 at 08:22.

## 2023-12-30 NOTE — Telephone Encounter (Signed)
 Called pt and advised pt of order being placed and scripts sent to [facility] pharmacy per PCP.    Comments added by Boyd Barrier on 12/30/23 at 11:18.

## 2024-01-09 ENCOUNTER — Telehealth (HOSPITAL_BASED_OUTPATIENT_CLINIC_OR_DEPARTMENT_OTHER): Payer: Self-pay | Admitting: Obstetrics & Gynecology

## 2024-01-09 NOTE — Telephone Encounter (Signed)
 I called patients insurance at [phone] on 01/09/2024 and they stated that member is active and no PA is needed for CPT Code 41899.       Ref Number: [id number]  Ginger Harsh, MA

## 2024-01-10 ENCOUNTER — Other Ambulatory Visit: Payer: Self-pay

## 2024-01-10 ENCOUNTER — Ambulatory Visit (HOSPITAL_BASED_OUTPATIENT_CLINIC_OR_DEPARTMENT_OTHER): Payer: Self-pay | Admitting: Obstetrics & Gynecology

## 2024-01-13 ENCOUNTER — Encounter (INDEPENDENT_AMBULATORY_CARE_PROVIDER_SITE_OTHER): Payer: Self-pay | Admitting: FAMILY MEDICINE

## 2024-01-14 ENCOUNTER — Other Ambulatory Visit (INDEPENDENT_AMBULATORY_CARE_PROVIDER_SITE_OTHER): Payer: Self-pay | Admitting: FAMILY MEDICINE

## 2024-01-14 DIAGNOSIS — E1159 Type 2 diabetes mellitus with other circulatory complications: Secondary | ICD-10-CM

## 2024-01-14 DIAGNOSIS — I10 Essential (primary) hypertension: Secondary | ICD-10-CM

## 2024-01-17 ENCOUNTER — Ambulatory Visit (INDEPENDENT_AMBULATORY_CARE_PROVIDER_SITE_OTHER): Payer: Self-pay | Admitting: FAMILY MEDICINE

## 2024-01-18 ENCOUNTER — Ambulatory Visit (HOSPITAL_BASED_OUTPATIENT_CLINIC_OR_DEPARTMENT_OTHER): Payer: Self-pay | Admitting: Obstetrics & Gynecology

## 2024-01-19 ENCOUNTER — Other Ambulatory Visit (INDEPENDENT_AMBULATORY_CARE_PROVIDER_SITE_OTHER): Payer: Self-pay | Admitting: FAMILY MEDICINE

## 2024-01-19 DIAGNOSIS — K219 Gastro-esophageal reflux disease without esophagitis: Secondary | ICD-10-CM

## 2024-01-19 MED ORDER — ESOMEPRAZOLE MAGNESIUM 40 MG CAPSULE,DELAYED RELEASE
DELAYED_RELEASE_CAPSULE | ORAL | 0 refills | Status: DC
Start: 2024-01-19 — End: 2024-02-06

## 2024-01-19 NOTE — Telephone Encounter (Signed)
 Refill Request    Last Appt.       Future Appt.       11/24/23 05/25/24

## 2024-01-21 ENCOUNTER — Other Ambulatory Visit (HOSPITAL_COMMUNITY): Payer: Self-pay

## 2024-01-21 DIAGNOSIS — E1142 Type 2 diabetes mellitus with diabetic polyneuropathy: Secondary | ICD-10-CM

## 2024-01-21 NOTE — Progress Notes (Unsigned)
 Population Health    Reason for Encounter: Medication Adherence Quality Review    I reviewed Angela Frey chart and medication dispensing report for compliance with tirzepatide  (MOUNJARO ) 12.5 mg/0.5 ml Subcutaneous Pen Injector and upon review the patient may benefit from the conversion to an extended day supply of their maintenance medications. Please review the pended prescriptions for appropriateness. Thank you for everything that you do for your patients and for your collaboration. Please feel free to reach out with any questions, concerns or assistance that I can provide.      Thanks!  Timm Bonenberger, NURSE NAVIGATOR

## 2024-01-22 MED ORDER — MOUNJARO 12.5 MG/0.5 ML SUBCUTANEOUS PEN INJECTOR
12.5000 mg | PEN_INJECTOR | SUBCUTANEOUS | 3 refills | Status: DC
Start: 2024-01-22 — End: 2024-04-12

## 2024-01-23 ENCOUNTER — Other Ambulatory Visit: Payer: Self-pay

## 2024-01-23 ENCOUNTER — Ambulatory Visit: Attending: FAMILY MEDICINE

## 2024-01-23 DIAGNOSIS — I10 Essential (primary) hypertension: Secondary | ICD-10-CM | POA: Insufficient documentation

## 2024-01-23 DIAGNOSIS — E1159 Type 2 diabetes mellitus with other circulatory complications: Secondary | ICD-10-CM | POA: Insufficient documentation

## 2024-01-24 ENCOUNTER — Other Ambulatory Visit (INDEPENDENT_AMBULATORY_CARE_PROVIDER_SITE_OTHER): Payer: Self-pay | Admitting: FAMILY MEDICINE

## 2024-01-24 ENCOUNTER — Ambulatory Visit (INDEPENDENT_AMBULATORY_CARE_PROVIDER_SITE_OTHER): Payer: Self-pay | Admitting: FAMILY MEDICINE

## 2024-01-24 LAB — COMPREHENSIVE METABOLIC PNL, FASTING
ALBUMIN: 4 g/dL (ref 3.4–4.8)
ALKALINE PHOSPHATASE: 70 U/L (ref 50–130)
ALT (SGPT): 14 U/L (ref <31)
ANION GAP: 7 mmol/L (ref 4–13)
AST (SGOT): 24 U/L (ref 11–34)
BILIRUBIN TOTAL: 0.3 mg/dL (ref 0.3–1.3)
BUN/CREA RATIO: 20 (ref 6–22)
BUN: 17 mg/dL (ref 8–25)
CALCIUM: 9.8 mg/dL (ref 8.6–10.3)
CHLORIDE: 107 mmol/L (ref 96–111)
CO2 TOTAL: 27 mmol/L (ref 23–31)
CREATININE: 0.87 mg/dL (ref 0.60–1.05)
ESTIMATED GFR - FEMALE: 75 mL/min/BSA (ref >=60)
GLUCOSE: 72 mg/dL (ref 70–99)
POTASSIUM: 4.6 mmol/L (ref 3.5–5.1)
PROTEIN TOTAL: 7.3 g/dL (ref 6.0–8.0)
SODIUM: 141 mmol/L (ref 136–145)

## 2024-01-24 LAB — HGA1C (HEMOGLOBIN A1C WITH EST AVG GLUCOSE)
ESTIMATED AVERAGE GLUCOSE: 94 mg/dL
HEMOGLOBIN A1C: 4.9 % (ref <=5.7)

## 2024-02-01 ENCOUNTER — Ambulatory Visit (HOSPITAL_COMMUNITY)

## 2024-02-01 ENCOUNTER — Telehealth (HOSPITAL_BASED_OUTPATIENT_CLINIC_OR_DEPARTMENT_OTHER): Payer: Self-pay | Admitting: Obstetrics & Gynecology

## 2024-02-01 ENCOUNTER — Encounter (HOSPITAL_BASED_OUTPATIENT_CLINIC_OR_DEPARTMENT_OTHER): Payer: Self-pay | Admitting: Obstetrics & Gynecology

## 2024-02-01 ENCOUNTER — Other Ambulatory Visit: Payer: Self-pay

## 2024-02-01 ENCOUNTER — Other Ambulatory Visit (HOSPITAL_BASED_OUTPATIENT_CLINIC_OR_DEPARTMENT_OTHER): Payer: Self-pay | Admitting: Obstetrics & Gynecology

## 2024-02-01 ENCOUNTER — Ambulatory Visit: Attending: Obstetrics & Gynecology | Admitting: Obstetrics & Gynecology

## 2024-02-01 VITALS — HR 78 | Temp 97.0°F | Resp 18 | Ht 64.0 in | Wt 183.0 lb

## 2024-02-01 DIAGNOSIS — R300 Dysuria: Secondary | ICD-10-CM

## 2024-02-01 DIAGNOSIS — N950 Postmenopausal bleeding: Secondary | ICD-10-CM

## 2024-02-01 DIAGNOSIS — R52 Pain, unspecified: Secondary | ICD-10-CM | POA: Insufficient documentation

## 2024-02-01 DIAGNOSIS — L280 Lichen simplex chronicus: Secondary | ICD-10-CM | POA: Insufficient documentation

## 2024-02-01 LAB — URINALYSIS, MACROSCOPIC
BILIRUBIN: NEGATIVE mg/dL
BLOOD: NEGATIVE mg/dL
GLUCOSE: NEGATIVE mg/dL
KETONES: NEGATIVE mg/dL
NITRITE: NEGATIVE
PH: 7.5 (ref 4.6–8.0)
PROTEIN: NEGATIVE mg/dL
SPECIFIC GRAVITY: 1.01 (ref 1.003–1.035)
UROBILINOGEN: 0.2 mg/dL (ref 0.2–1.0)

## 2024-02-01 LAB — URINALYSIS, MICROSCOPIC

## 2024-02-01 MED ORDER — CLOBETASOL 0.05 % TOPICAL OINTMENT
TOPICAL_OINTMENT | CUTANEOUS | 1 refills | Status: DC
Start: 2024-02-02 — End: 2024-03-15

## 2024-02-01 MED ORDER — NORETHINDRONE (CONTRACEPTIVE) 0.35 MG TABLET
1.0000 | ORAL_TABLET | Freq: Every day | ORAL | 3 refills | Status: DC
Start: 2024-02-01 — End: 2024-05-02

## 2024-02-01 NOTE — Progress Notes (Signed)
 MEDICAL OFFICE BUILDING 3 [facility], MEDICAL OFFICE BUILDING 3  [address]  [place] [address]  [id number]    [name] 02/01/2024   [id number] 1961/06/25     Chief Complaint: Endometrial Biopsy (Patient is uncertain why the biopsy is needed. Wants to discuss before proceeding)      Subjective: Angela Frey is a 63 y.o. female presenting for f/u testing. US  did not reveal any abnormalities. EM 5mm.   Recommend endometrial biopsy.   Previous work up in 2022 was benign.     Pt c/o severe pain in gluteal cleft. Hurts to sit. Onset months ago.       Medical History   I have reviewed and updated as appropriate the past medical, family and social history today:    Medical History/Surgical History/Family History  Past Medical History:   Diagnosis Date    Asthma     Centrilobular emphysema (CMS HCC)     Chronic obstructive airway disease     Chronic respiratory failure with hypoxia (CMS HCC)     Hypertension     Hypothyroidism     OSA (obstructive sleep apnea)     SOB (shortness of breath)     Type 2 diabetes mellitus           Past Surgical History:   Procedure Laterality Date    HX PELVIC LAPAROSCOPY      HX TONSILLECTOMY      HX TUBAL LIGATION      LEG SURGERY      2-3 surgeries    NECK SURGERY      TUBOPLASTY / TUBOTUBAL ANASTOMOSIS      ULNAR NERVE REPAIR Bilateral           Family Medical History:       Problem Relation (Age of Onset)    Arthritis-osteo Mother, Father    Asthma Mother    Blood Clots Father    Congestive Heart Failure Father    Coronary Artery Disease Father    Heart Attack Father    High Cholesterol Father    Hypertension (High Blood Pressure) Mother, Father    Migraines Mother    Sleep disorders Father    Thyroid  Disease Mother             Menstrual History    Total time span for birth control pills      Use of HRT (how long total use)      Use of Infertility Treatments      Age of Menarche      Avg. frequency (days)      Duration (days)      Flow      Pattern      Age  of Menopause         OB History   Gravida Para Term Preterm AB Living   2 2 2   2    SAB IAB Ectopic Multiple Live Births             # Outcome Date GA Lbr Len/2nd Weight Sex Type Anes PTL Lv   2 Term            1 Term              Social History     Socioeconomic History    Marital status: Widowed   Tobacco Use    Smoking status: Former     Current packs/day: 0.00     Average packs/day: 1.3 packs/day for 40.0  years (50.0 ttl pk-yrs)     Types: Cigarettes     Start date: 49     Quit date: 2015     Years since quitting: 10.5    Smokeless tobacco: Never   Vaping Use    Vaping status: Never Used   Substance and Sexual Activity    Alcohol use: Never    Drug use: Never    Sexual activity: Not Currently     Birth control/protection: Post-menopausal     Social Determinants of Health     Financial Resource Strain: Low Risk  (11/24/2023)    Financial Resource Strain     SDOH Financial: No   Transportation Needs: Low Risk  (11/24/2023)    Transportation Needs     SDOH Transportation: No   Social Connections: Low Risk  (11/24/2023)    Social Connections     SDOH Social Isolation: 5 or more times a week   Intimate Partner Violence: Low Risk  (11/24/2023)    Intimate Partner Violence     SDOH Domestic Violence: No   Housing Stability: Low Risk  (11/24/2023)    Housing Stability     SDOH Housing Situation: I have housing.     SDOH Housing Worry: No     Expanded Substance History       Allergies:  Allergies[1]  Medications:  Current Outpatient Medications   Medication Sig    albuterol  sulfate (PROVENTIL  OR VENTOLIN  OR PROAIR ) 90 mcg/actuation Inhalation oral inhaler Take 1-2 Puffs by inhalation Every 4 hours as needed    albuterol  sulfate (PROVENTIL ) 2.5 mg /3 mL (0.083 %) Inhalation nebulizer solution Take 3 mL (2.5 mg total) by nebulization Every 4 hours as needed for Wheezing    amitriptyline  (ELAVIL ) 25 mg Oral Tablet Take 1 Tablet (25 mg total) by mouth Every night    aspirin 81 mg Oral Tablet, Chewable Chew 1 Tablet (81 mg  total) Daily    baclofen  (LIORESAL ) 10 mg Oral Tablet Take 1 Tablet (10 mg total) by mouth Four times a day Indications: muscle spasms caused by a spinal disease    Blood Sugar Diagnostic (ACCU-CHEK GUIDE TEST STRIPS) Does not apply Strip 1 Strip Four times a day    Blood-Glucose Meter Misc 4 times per day    budesonide -formoteroL  (SYMBICORT ) 160-4.5 mcg/actuation Inhalation oral inhaler Take 2 Puffs by inhalation Twice daily    calcium carbonate-vitamin D3 (CALCIUM 600 WITH VITAMIN D3) 600 mg-12.5 mcg (500 unit) Oral Capsule Take by mouth    cholecalciferol, Vitamin D3, 125 mcg (5,000 unit) Oral Tablet Take by oral route.    cyanocobalamin (VITAMIN B 12) 1,000 mcg Oral Tablet Take 1 Tablet (1,000 mcg total) by mouth Daily    esomeprazole  magnesium  (NEXIUM ) 40 mg Oral Capsule, Delayed Release(E.C.) TAKE 1 CAPSULE BY MOUTH IN THE MORNING BEFORE BREAKFAST    fluorouraciL (EFUDEX) 5 % Cream Apply topically    folic acid (FOLVITE) 1 mg Oral Tablet Take 1 Tablet (1 mg total) by mouth Daily    gabapentin  (NEURONTIN ) 600 mg Oral Tablet Take 1 Tablet (600 mg total) by mouth Three times a day    hydroCHLOROthiazide  (HYDRODIURIL ) 25 mg Oral Tablet Take 1 tablet by mouth once daily    ketoconazole  (NIZORAL ) 2 % Shampoo USE 1/2 TO 1 (ONE-HALF TO ONE) OUNCE OF SHAMPOO TOPICALLY THREE TIMES A WEEK    Lactobac no.41/Bifidobact no.7 (PROBIOTIC-10 ORAL) Take by mouth Once a day    Lancets (ACCU-CHEK SOFTCLIX LANCETS) Misc 1 Each Four times  a day    levothyroxine  (SYNTHROID) 175 mcg Oral Tablet Take 1 Tablet (175 mcg total) by mouth Every morning    lisinopriL  (PRINIVIL ) 20 mg Oral Tablet Take 1 Tablet (20 mg total) by mouth Daily    magnesium  Oxide 420 mg Oral Tablet Take 1 Tablet (420 mg total) by mouth Daily    meloxicam  (MOBIC ) 15 mg Oral Tablet Take 1 Tablet (15 mg total) by mouth Once per day as needed for Pain    MILK THISTLE ORAL Take by mouth    nystatin  (NYSTOP ) 100,000 unit/gram Powder by Apply Topically route Three times  a day as needed    pediatric multivitamins Oral Tablet, Chewable Chew 1 Tablet Daily    pravastatin  (PRAVACHOL ) 10 mg Oral Tablet Take 1 Tablet (10 mg total) by mouth Every evening    sertraline  (ZOLOFT ) 100 mg Oral Tablet Take 1 Tablet (100 mg total) by mouth Daily    tiotropium bromide  (SPIRIVA  HANDIHALER) 18 mcg Inhalation Capsule, w/Inhalation Device Take 1 Capsule (18 mcg total) by inhalation Once a day    tirzepatide  (MOUNJARO ) 12.5 mg/0.5 mL Subcutaneous Pen Injector Inject 0.5 mL (12.5 mg total) under the skin Every 7 days    triamcinolone acetonide 0.1 % Cream Apply topically    trimethoprim -sulfamethoxazole  (BACTRIM  DS) 160-800mg  per tablet Take 1 Tablet (160 mg total) by mouth Every 12 hours    turmeric/turmeric ext/pepr ext (TURMERIC-TURMERIC EXT-PEPPER) 500-3 mg Oral Capsule Take by mouth    vitamin E 400 unit Oral Capsule Take 1 Capsule (400 Units total) by mouth Daily     Immunization History:  Immunization History   Administered Date(s) Administered    Covid-19 Vaccine,Moderna,12 Years+ 08/05/2020    Covid-19 Vaccine,Pfizer-BioNTech,Purple Top,3yrs+ 09/22/2019, 10/13/2019    DIPTH,PERTUSSIS-ACEL,TETANUS >10 YRS OLD 06/13/2012    Flu Family 05/28/2013    High-Dose Influenza Vaccine, 65+ 06/01/2011    Influenza Vaccine, 6 month-adult 06/05/2012, 05/21/2014, 05/28/2015, 05/17/2017, 05/14/2019, 06/01/2023    Tetanus Toxoid/Diphtheria Toxoid/Acellular Pertussis Vaccine, Adsorbed 10/02/2023     Problem List:  Patient Active Problem List    Diagnosis Date Noted    Hepatic cirrhosis, unspecified hepatic cirrhosis type, unspecified whether ascites present (CMS HCC) 06/01/2023    Diabetic polyneuropathy associated with type 2 diabetes mellitus (CMS HCC) 05/01/2023    Obesity 03/10/2023    Benign neoplasm of ascending colon 12/02/2022    Type 2 diabetes mellitus with circulatory disorder 12/02/2022    Polyp of sigmoid colon 12/02/2022    Stenosis of carotid artery 12/02/2022    Left hip pain 11/15/2022     Varicose veins of both lower extremities 09/03/2022    Depression screening negative 03/02/2022    Osteoarthritis of left shoulder 02/26/2022    Family history of kidney cancer 10/21/2021    PMB (postmenopausal bleeding) 04/01/2021    Full thickness rotator cuff tear 01/09/2021    Abdominal aortic atherosclerosis (CMS HCC) 07/09/2020    Mixed stress and urge urinary incontinence 02/28/2020    Essential hypertension 02/20/2020    Hypothyroidism due to Hashimoto's thyroiditis 02/20/2020    Mild intermittent asthma without complication 02/20/2020    Moderate major depression (CMS HCC) 02/20/2020    Gastroesophageal reflux disease without esophagitis 02/20/2020    Mixed hyperlipidemia 02/20/2020    Primary insomnia 02/20/2020    OSA (obstructive sleep apnea) 03/07/2018    Carpal tunnel syndrome, bilateral 11/22/2017    Chronic neck pain 01/24/2017    Osteoarthritis of multiple joints 10/12/2016    Family history of vitamin B12  deficiency 10/01/2016    GAD (generalized anxiety disorder) 10/01/2016    Recurrent major depressive episodes, moderate (CMS HCC) 10/01/2016    Arthropathy 06/16/2016    History of colonic polyps 06/16/2016    Rectal polyp 06/16/2016    Allergic rhinitis, seasonal 02/09/2016    At risk for injury 02/09/2016    Compulsive tobacco user syndrome 02/09/2016    FOM (frequency of micturition) 02/09/2016    Mononeuropathy of upper extremity 02/09/2016    RLS (restless legs syndrome) 02/09/2016    Centriacinar emphysema (CMS HCC) 05/28/2015    Chronic fatigue 05/28/2015    Neuropathy (CMS HCC) 05/28/2015    Incomplete emptying of bladder 11/11/2014    Fixation hardware in lower extremity 04/23/2014    History of restless legs syndrome 04/22/2014    Ex-smoker 08/13/2013    Cubital tunnel syndrome 08/20/2011    Incomplete uterovaginal prolapse 06/08/2011    Accessory skin tags 06/01/2011       Review of Systems:  ROS neg except as mentioned in HPI.    Objective   Vitals:  Vitals:    02/01/24 1043   Pulse:  78   Resp: 18   Temp: 36.1 C (97 F)   SpO2: 97%   Weight: 83 kg (183 lb)   Height: 1.626 m (5' 4)   BMI: 31.41      Height: 162.6 cm (5' 4)  Wt Readings from Last 5 Encounters:   02/01/24 83 kg (183 lb)   12/20/23 83 kg (183 lb)   11/24/23 83 kg (183 lb)   10/18/23 85.7 kg (189 lb)   10/12/23 85.3 kg (188 lb)         Physical Exam:  General Appearance: appears chronically ill, appears stated age, no distress, and vital signs reviewed  Head: NCAT  Eyes: lids and lashes normal, conjunctivae and sclerae normal and pupils equal, EOM intact  Psychiatric: Normal affect, behavior, memory, thought content, judgement, and speech.  Neurology: normal without focal findings  mental status, speech normal, alert and oriented x iii      Gluteal cleft with dry papery shiny tissue and paper cut like fissure. Tender.          Laboratory Studies/Data Reviewed    Nursing Notes:   Gandee, Krista, LPN  92/76/74 8950  Signed   Travel Screening       Question Response    Have you been in contact with someone who was sick? --    Do you have any of the following new or worsening symptoms? None of these    Have you traveled internationally in the last month? No          Travel History   Travel since 01/02/24    No documented travel since 01/02/24       Krista Gandee, LPN         Results for orders placed or performed in visit on 01/23/24 (from the past 4 weeks)   HGA1C (HEMOGLOBIN A1C WITH EST AVG GLUCOSE)    Collection Time: 01/23/24 11:52 PM   Result Value Ref Range    HEMOGLOBIN A1C 4.9 <=5.7 %    ESTIMATED AVERAGE GLUCOSE 94 mg/dL    Narrative    ADA endorsed glycated hemoglobin decision points:  5.7-6.4% Pre-Diabetes   >6.5% Diabetes      COMPREHENSIVE METABOLIC PNL, FASTING    Collection Time: 01/23/24 11:52 PM   Result Value Ref Range    SODIUM 141 136 - 145 mmol/L  POTASSIUM 4.6 3.5 - 5.1 mmol/L    CHLORIDE 107 96 - 111 mmol/L    CO2 TOTAL 27 23 - 31 mmol/L    ANION GAP 7 4 - 13 mmol/L    BUN 17 8 - 25 mg/dL    CREATININE 9.12  9.39 - 1.05 mg/dL    BUN/CREA RATIO 20 6 - 22    ESTIMATED GFR - FEMALE 75 >=60 mL/min/BSA    ALBUMIN 4.0 3.4 - 4.8 g/dL    CALCIUM 9.8 8.6 - 89.6 mg/dL    GLUCOSE 72 70 - 99 mg/dL    ALKALINE PHOSPHATASE 70 50 - 130 U/L    ALT (SGPT) 14 <31 U/L    AST (SGOT)  24 11 - 34 U/L    BILIRUBIN TOTAL 0.3 0.3 - 1.3 mg/dL    PROTEIN TOTAL 7.3 6.0 - 8.0 g/dL        No results found for this or any previous visit (from the past 720 hours).     Assessment/Plan   Diagnosis    ICD-10-CM    1. PMB (postmenopausal bleeding)  N95.0 58100 - ENDOMETRIAL SAMPLING W/ OR W/O ENDOCERVICAL SAMPLING, W/O CERVICAL DILATION (AMB ONLY)     SURGICAL PATHOLOGY SPECIMEN      2. Dysuria  R30.0 URINALYSIS, MACROSCOPIC AND MICROSCOPIC W/CULTURE REFLEX      3. 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 Pain  R52           Discussed plan with patient.       Plan  Endometrial biopsy, pt agrees to procedure  Trial of norethindrone , counseled on use  Recommend coconut oil daily  Clobetasol  rx given  Avoid baby wipes        Return in about 3 weeks (around 02/22/2024).      Mikie Misner E Preethi Scantlebury, DO         [1]   Allergies  Allergen Reactions    Pseudoephedrine Hcl Hives/ Urticaria    Doxylamin-Pse-Dm-Acetaminophen   Other Adverse Reaction (Add comment), Hives/ Urticaria and Rash    Clindamycin Phosphate     Guaifenesin   Other Adverse Reaction (Add comment)    Cleocin [Clindamycin] Hives/ Urticaria    Tessalon [Benzonatate] Hives/ Urticaria

## 2024-02-01 NOTE — Telephone Encounter (Signed)
 Patient called and said they were no medications to pick up from the pharmacy. there were no new medication in meds list but the patient states Dr.Kessler was calling in meds.        Glee Lashomb [place], [address] [place]

## 2024-02-01 NOTE — Procedures (Signed)
 GYNECOLOGY, MEDICAL OFFICE BUILDING 3  [address]  [place] [address]  Operated by [facility]  Procedure Note    Name: Marquelle Balow MRN:  [id number]   Date: 02/01/2024 DOB:  05-30-1961 (63 y.o.)         58100 - ENDOMETRIAL SAMPLING W/ OR W/O ENDOCERVICAL SAMPLING, W/O CERVICAL DILATION (AMB ONLY)    Performed by: Cathryne Sherryle BRAVO, DO  Authorized by: Cathryne Sherryle BRAVO, DO    Time Out:     Patient verified:  Yes    Procedure Verified:  Yes    Site Verified:  Yes  Documentation:      PROCEDURE:  Endometrial Biopsy    The vagina and cervix were prepped with betadine.  The cervix was grasped horizontally on the posterior lip.  The uterus sounded to 6cm.    Endometrial biopsy was performed.  Patient tolerated procedure with anxiety and demonstrative discomfort.  Tissue was passed off for pathology.

## 2024-02-01 NOTE — Addendum Note (Signed)
 Addended by: CATHRYNE SHERRYLE BRAVO on: 02/01/2024 04:34 PM     Modules accepted: Orders

## 2024-02-01 NOTE — Nursing Note (Signed)
 Travel Screening       Question Response    Have you been in contact with someone who was sick? --    Do you have any of the following new or worsening symptoms? None of these    Have you traveled internationally in the last month? No          Travel History   Travel since 01/02/24    No documented travel since 01/02/24       Bogdan Vivona Gandee, LPN

## 2024-02-03 LAB — URINE CULTURE,ROUTINE: URINE CULTURE: >100000 — AB

## 2024-02-04 ENCOUNTER — Other Ambulatory Visit (INDEPENDENT_AMBULATORY_CARE_PROVIDER_SITE_OTHER): Payer: Self-pay | Admitting: FAMILY MEDICINE

## 2024-02-04 DIAGNOSIS — N390 Urinary tract infection, site not specified: Secondary | ICD-10-CM

## 2024-02-05 NOTE — Telephone Encounter (Signed)
 Acute medication

## 2024-02-06 ENCOUNTER — Telehealth (INDEPENDENT_AMBULATORY_CARE_PROVIDER_SITE_OTHER): Payer: Self-pay | Admitting: FAMILY MEDICINE

## 2024-02-06 ENCOUNTER — Other Ambulatory Visit (INDEPENDENT_AMBULATORY_CARE_PROVIDER_SITE_OTHER): Payer: Self-pay | Admitting: FAMILY MEDICINE

## 2024-02-06 ENCOUNTER — Other Ambulatory Visit (INDEPENDENT_AMBULATORY_CARE_PROVIDER_SITE_OTHER): Payer: Self-pay | Admitting: NURSE PRACTITIONER

## 2024-02-06 DIAGNOSIS — I10 Essential (primary) hypertension: Secondary | ICD-10-CM

## 2024-02-06 DIAGNOSIS — K219 Gastro-esophageal reflux disease without esophagitis: Secondary | ICD-10-CM

## 2024-02-06 DIAGNOSIS — F321 Major depressive disorder, single episode, moderate: Secondary | ICD-10-CM

## 2024-02-06 DIAGNOSIS — N390 Urinary tract infection, site not specified: Secondary | ICD-10-CM

## 2024-02-06 DIAGNOSIS — N950 Postmenopausal bleeding: Secondary | ICD-10-CM

## 2024-02-06 DIAGNOSIS — L219 Seborrheic dermatitis, unspecified: Secondary | ICD-10-CM

## 2024-02-06 LAB — SURGICAL PATHOLOGY SPECIMEN

## 2024-02-06 MED ORDER — KETOCONAZOLE 2 % SHAMPOO
MEDICATED_SHAMPOO | CUTANEOUS | 0 refills | Status: AC
Start: 2024-02-06 — End: ?

## 2024-02-06 MED ORDER — NITROFURANTOIN MONOHYDRATE/MACROCRYSTALS 100 MG CAPSULE
100.0000 mg | ORAL_CAPSULE | Freq: Two times a day (BID) | ORAL | 0 refills | Status: DC
Start: 2024-02-06 — End: 2024-04-12

## 2024-02-06 MED ORDER — ESOMEPRAZOLE MAGNESIUM 40 MG CAPSULE,DELAYED RELEASE: DELAYED_RELEASE_CAPSULE | ORAL | 0 refills | Status: DC

## 2024-02-06 MED ORDER — SERTRALINE 100 MG TABLET
100.0000 mg | ORAL_TABLET | Freq: Every day | ORAL | 0 refills | Status: DC
Start: 2024-02-06 — End: 2024-02-16

## 2024-02-06 MED ORDER — HYDROCHLOROTHIAZIDE 25 MG TABLET
25.0000 mg | ORAL_TABLET | Freq: Every day | ORAL | 0 refills | Status: AC
Start: 2024-02-06 — End: ?

## 2024-02-06 NOTE — Telephone Encounter (Signed)
 I called Angela Frey to discuss her symptoms. She said she doesn't have typical UTI symptoms, but started with bleeding early this morning. She had done a urine with [name] and tried to follow up with [name] since Dr. Cathryne was off. She has had 3-4 UTI's in the past year and is unsure if the last antibiotic completely treated the last UTI or if she has a new one. She said her allergies are up-to-date in the chart. She's unsure if the antibiotic from last time was helpful.     Allergies: Doxylamin-pse-dm-acetaminophen    Clindamycin Phosphate   Guaifenesin    Clecin (Clindamycin)   Tessalon(Benzonatate)    02/01/2024 Urine culture [name] uses [facility] in [place] and has someone available to pick up her prescription if we can sent it soon.     Please advise,   Thank you,   Lauraine Pouch, CMA

## 2024-02-06 NOTE — Telephone Encounter (Signed)
 Rain is requesting a refill of Sertraline  to [facility] she only has one tablet left.     Please advise,   Thank you,   Lauraine Pouch, CMA

## 2024-02-06 NOTE — Telephone Encounter (Signed)
 This was sent earlier today.

## 2024-02-06 NOTE — Telephone Encounter (Signed)
 Patient is aware the script has already been sent.   Lauraine Pouch, CMA

## 2024-02-06 NOTE — Telephone Encounter (Signed)
 I called Rain to discuss her symptoms. She said she doesn't have typical UTI symptoms, but started with bleeding early this morning. She had done a urine with [name] and tried to follow up with [name] since Dr. Cathryne was off. She has had 3-4 UTI's in the past year and is unsure if the last antibiotic completely treated the last UTI or if she has a new one. She said her allergies are up-to-date in the chart. She's unsure if the antibiotic from last time was helpful.     Allergies: Doxylamin-pse-dm-acetaminophen    Clindamycin Phosphate   Guaifenesin    Clecin (Clindamycin)   Tessalon(Benzonatate)    02/01/2024 Urine culture [name] uses [facility] in [place] and has someone available to pick up her prescription if we can sent it soon.     Please advise,   Thank you,   Lauraine Pouch, CMA

## 2024-02-07 ENCOUNTER — Other Ambulatory Visit (INDEPENDENT_AMBULATORY_CARE_PROVIDER_SITE_OTHER): Payer: Self-pay | Admitting: FAMILY MEDICINE

## 2024-02-07 DIAGNOSIS — I10 Essential (primary) hypertension: Secondary | ICD-10-CM

## 2024-02-08 ENCOUNTER — Ambulatory Visit (HOSPITAL_BASED_OUTPATIENT_CLINIC_OR_DEPARTMENT_OTHER): Payer: Self-pay | Admitting: Obstetrics & Gynecology

## 2024-02-16 ENCOUNTER — Other Ambulatory Visit (INDEPENDENT_AMBULATORY_CARE_PROVIDER_SITE_OTHER): Payer: Self-pay | Admitting: FAMILY MEDICINE

## 2024-02-16 ENCOUNTER — Telehealth (INDEPENDENT_AMBULATORY_CARE_PROVIDER_SITE_OTHER): Payer: Self-pay | Admitting: FAMILY MEDICINE

## 2024-02-16 DIAGNOSIS — F321 Major depressive disorder, single episode, moderate: Secondary | ICD-10-CM

## 2024-02-16 MED ORDER — SERTRALINE 100 MG TABLET: 100.0000 mg | ORAL_TABLET | Freq: Every day | ORAL | 1 refills | Status: DC

## 2024-02-27 ENCOUNTER — Ambulatory Visit (HOSPITAL_BASED_OUTPATIENT_CLINIC_OR_DEPARTMENT_OTHER): Payer: Self-pay | Admitting: Obstetrics & Gynecology

## 2024-02-28 ENCOUNTER — Ambulatory Visit: Payer: Self-pay | Attending: Obstetrics & Gynecology | Admitting: Obstetrics & Gynecology

## 2024-02-28 ENCOUNTER — Ambulatory Visit (HOSPITAL_COMMUNITY)

## 2024-02-28 ENCOUNTER — Other Ambulatory Visit: Payer: Self-pay

## 2024-02-28 ENCOUNTER — Encounter (HOSPITAL_BASED_OUTPATIENT_CLINIC_OR_DEPARTMENT_OTHER): Payer: Self-pay | Admitting: Obstetrics & Gynecology

## 2024-02-28 VITALS — HR 95 | Temp 97.2°F | Resp 18 | Ht 64.0 in | Wt 183.0 lb

## 2024-02-28 DIAGNOSIS — K760 Fatty (change of) liver, not elsewhere classified: Secondary | ICD-10-CM

## 2024-02-28 DIAGNOSIS — R945 Abnormal results of liver function studies: Secondary | ICD-10-CM

## 2024-02-28 DIAGNOSIS — Z01810 Encounter for preprocedural cardiovascular examination: Secondary | ICD-10-CM | POA: Insufficient documentation

## 2024-02-28 DIAGNOSIS — N950 Postmenopausal bleeding: Secondary | ICD-10-CM | POA: Insufficient documentation

## 2024-02-28 LAB — CBC WITH DIFF
BASOPHIL #: <0.1 x10ˆ3/uL (ref <=0.20)
BASOPHIL %: 0.2 %
EOSINOPHIL #: 0.3 x10ˆ3/uL (ref <=0.50)
EOSINOPHIL %: 6.6 %
HCT: 31.2 % — ABNORMAL LOW (ref 34.8–46.0)
HGB: 10 g/dL — ABNORMAL LOW (ref 11.5–16.0)
IMMATURE GRANULOCYTE #: <0.1 x10ˆ3/uL (ref <0.10)
IMMATURE GRANULOCYTE %: 0.2 % (ref 0.0–1.0)
LYMPHOCYTE #: 1.24 x10ˆ3/uL (ref 1.00–4.80)
LYMPHOCYTE %: 27.4 %
MCH: 26.8 pg (ref 26.0–32.0)
MCHC: 32.1 g/dL (ref 31.0–35.5)
MCV: 83.6 fL (ref 78.0–100.0)
MONOCYTE #: 0.36 x10ˆ3/uL (ref 0.20–1.10)
MONOCYTE %: 7.9 %
MPV: 11.8 fL (ref 8.7–12.5)
NEUTROPHIL #: 2.61 x10ˆ3/uL (ref 1.50–7.70)
NEUTROPHIL %: 57.7 %
PLATELETS: 110 x10ˆ3/uL — ABNORMAL LOW (ref 150–400)
RBC: 3.73 x10ˆ6/uL — ABNORMAL LOW (ref 3.85–5.22)
RDW-CV: 14.6 % (ref 11.5–15.5)
WBC: 4.5 x10ˆ3/uL (ref 3.7–11.0)

## 2024-02-28 LAB — COMPREHENSIVE METABOLIC PANEL, NON-FASTING
ALBUMIN: 3.8 g/dL (ref 3.4–4.8)
ALKALINE PHOSPHATASE: 71 U/L (ref 50–130)
ALT (SGPT): 12 U/L (ref <31)
ANION GAP: 5 mmol/L (ref 4–13)
AST (SGOT): 26 U/L (ref 11–34)
BILIRUBIN TOTAL: 0.3 mg/dL (ref 0.3–1.3)
BUN/CREA RATIO: 22 (ref 6–22)
BUN: 21 mg/dL (ref 8–25)
CALCIUM: 9.5 mg/dL (ref 8.6–10.3)
CHLORIDE: 109 mmol/L (ref 96–111)
CO2 TOTAL: 27 mmol/L (ref 23–31)
CREATININE: 0.96 mg/dL (ref 0.60–1.05)
ESTIMATED GFR - FEMALE: 66 mL/min/BSA (ref >=60)
GLUCOSE: 90 mg/dL (ref 65–125)
POTASSIUM: 4.5 mmol/L (ref 3.5–5.1)
PROTEIN TOTAL: 7 g/dL (ref 6.0–8.0)
SODIUM: 141 mmol/L (ref 136–145)

## 2024-02-28 LAB — PT/INR
INR: 1.1 (ref 0.94–1.14)
PROTHROMBIN TIME: 12.7 s (ref 10.5–12.7)

## 2024-02-28 NOTE — Progress Notes (Signed)
 MEDICAL OFF[facility]ILDING [facility]ICAL OFF[facility] 3  [address]  [place] [address]  [phone]    Angela Frey 02/28/2024   [id number] 07-02-1961     Chief Complaint: Follow Up (Follow up on norethindrone )      Subjective: Angela Frey is a 63 y.o. female presenting for f/u on norethindrone . Pt still has bleeding. She would like hysterectomy.     Medical History   I have reviewed and updated as appropriate the past medical, family and social history today:    Medical History/Surgical History/Family History  Past Medical History:   Diagnosis Date    Asthma     Centrilobular emphysema (CMS HCC)     Chronic obstructive airway disease     Chronic respiratory failure with hypoxia (CMS HCC)     Hypertension     Hypothyroidism     OSA (obstructive sleep apnea)     SOB (shortness of breath)     Type 2 diabetes mellitus           Past Surgical History:   Procedure Laterality Date    HX PELVIC LAPAROSCOPY      HX TONSILLECTOMY      HX TUBAL LIGATION      LEG SURGERY      2-3 surgeries    NECK SURGERY      TUBOPLASTY / TUBOTUBAL ANASTOMOSIS      ULNAR NERVE REPAIR Bilateral           Family Medical History:       Problem Relation (Age of Onset)    Arthritis-osteo Mother, Father    Asthma Mother    Blood Clots Father    Congestive Heart Failure Father    Coronary Artery Disease Father    Heart Attack Father    High Cholesterol Father    Hypertension (High Blood Pressure) Mother, Father    Migraines Mother    Sleep disorders Father    Thyroid  Disease Mother             Menstrual History    Total time span for birth control pills      Use of HRT (how long total use)      Use of Infertility Treatments      Age of Menarche      Avg. frequency (days)      Duration (days)      Flow      Pattern      Age of Menopause         OB History   Gravida Para Term Preterm AB Living   2 2 2   2    SAB IAB Ectopic Multiple Live Births             # Outcome Date GA Lbr Len/2nd Weight Sex Type Anes PTL Lv   2  Term            1 Term              Social History     Socioeconomic History    Marital status: Widowed   Tobacco Use    Smoking status: Former     Current packs/day: 0.00     Average packs/day: 1.3 packs/day for 40.0 years (50.0 ttl pk-yrs)     Types: Cigarettes     Start date: 40     Quit date: 2015     Years since quitting: 10.6    Smokeless tobacco: Never   Vaping  Use    Vaping status: Never Used   Substance and Sexual Activity    Alcohol use: Never    Drug use: Never    Sexual activity: Not Currently     Birth control/protection: Post-menopausal     Social Determinants of Health     Financial Resource Strain: Low Risk  (11/24/2023)    Financial Resource Strain     SDOH Financial: No   Transportation Needs: Low Risk  (11/24/2023)    Transportation Needs     SDOH Transportation: No   Social Connections: Low Risk  (11/24/2023)    Social Connections     SDOH Social Isolation: 5 or more times a week   Intimate Partner Violence: Low Risk  (11/24/2023)    Intimate Partner Violence     SDOH Domestic Violence: No   Housing Stability: Low Risk  (11/24/2023)    Housing Stability     SDOH Housing Situation: I have housing.     SDOH Housing Worry: No     Expanded Substance History       Allergies:  Allergies[1]  Medications:  Current Outpatient Medications   Medication Sig    albuterol  sulfate (PROVENTIL  OR VENTOLIN  OR PROAIR ) 90 mcg/actuation Inhalation oral inhaler Take 1-2 Puffs by inhalation Every 4 hours as needed    albuterol  sulfate (PROVENTIL ) 2.5 mg /3 mL (0.083 %) Inhalation nebulizer solution Take 3 mL (2.5 mg total) by nebulization Every 4 hours as needed for Wheezing    amitriptyline  (ELAVIL ) 25 mg Oral Tablet Take 1 Tablet (25 mg total) by mouth Every night    aspirin 81 mg Oral Tablet, Chewable Chew 1 Tablet (81 mg total) Daily    baclofen  (LIORESAL ) 10 mg Oral Tablet Take 1 Tablet (10 mg total) by mouth Four times a day Indications: muscle spasms caused by a spinal disease    Blood Sugar Diagnostic (ACCU-CHEK  GUIDE TEST STRIPS) Does not apply Strip 1 Strip Four times a day    Blood-Glucose Meter Misc 4 times per day    budesonide -formoteroL  (SYMBICORT ) 160-4.5 mcg/actuation Inhalation oral inhaler Take 2 Puffs by inhalation Twice daily    calcium carbonate-vitamin D3 (CALCIUM 600 WITH VITAMIN D3) 600 mg-12.5 mcg (500 unit) Oral Capsule Take by mouth    cholecalciferol, Vitamin D3, 125 mcg (5,000 unit) Oral Tablet Take by oral route.    clobetasoL  (TEMOVATE ) 0.05 % Ointment Apply topically Every MON and THURS Pea sized dose to gluteal cleft    cyanocobalamin (VITAMIN B 12) 1,000 mcg Oral Tablet Take 1 Tablet (1,000 mcg total) by mouth Daily    esomeprazole  magnesium  (NEXIUM ) 40 mg Oral Capsule, Delayed Release(E.C.) TAKE 1 CAPSULE BY MOUTH IN THE MORNING BEFORE BREAKFAST    fluorouraciL (EFUDEX) 5 % Cream Apply topically    folic acid (FOLVITE) 1 mg Oral Tablet Take 1 Tablet (1 mg total) by mouth Daily    gabapentin  (NEURONTIN ) 600 mg Oral Tablet Take 1 Tablet (600 mg total) by mouth Three times a day    hydroCHLOROthiazide  (HYDRODIURIL ) 25 mg Oral Tablet Take 1 Tablet (25 mg total) by mouth Daily    ketoconazole  (NIZORAL ) 2 % Shampoo USE 1/2 TO 1 (ONE-HALF TO ONE) OUNCE OF SHAMPOO TOPICALLY THREE TIMES A WEEK    Lactobac no.41/Bifidobact no.7 (PROBIOTIC-10 ORAL) Take by mouth Once a day    Lancets (ACCU-CHEK SOFTCLIX LANCETS) Misc 1 Each Four times a day    levothyroxine  (SYNTHROID) 175 mcg Oral Tablet Take 1 Tablet (175 mcg total) by mouth  Every morning    lisinopriL  (PRINIVIL ) 20 mg Oral Tablet TAKE 1 TABLET(20 MG) BY MOUTH DAILY    magnesium  Oxide 420 mg Oral Tablet Take 1 Tablet (420 mg total) by mouth Daily    meloxicam  (MOBIC ) 15 mg Oral Tablet Take 1 Tablet (15 mg total) by mouth Once per day as needed for Pain    MILK THISTLE ORAL Take by mouth    nitrofurantoin  monohyd/m-cryst (MACROBID ) 100 mg Oral Capsule Take 1 Capsule (100 mg total) by mouth Twice daily    Norethindrone , Contraceptive, (equiv to: ORTHO  MICRONOR ) 0.35 mg Oral tablet Take 1 Tablet (0.35 mg total) by mouth Daily    nystatin  (NYSTOP ) 100,000 unit/gram Powder by Apply Topically route Three times a day as needed    pediatric multivitamins Oral Tablet, Chewable Chew 1 Tablet Daily    pravastatin  (PRAVACHOL ) 10 mg Oral Tablet Take 1 Tablet (10 mg total) by mouth Every evening    sertraline  (ZOLOFT ) 100 mg Oral Tablet Take 1 Tablet (100 mg total) by mouth Daily    tiotropium bromide  (SPIRIVA  HANDIHALER) 18 mcg Inhalation Capsule, w/Inhalation Device Take 1 Capsule (18 mcg total) by inhalation Once a day    tirzepatide  (MOUNJARO ) 12.5 mg/0.5 mL Subcutaneous Pen Injector Inject 0.5 mL (12.5 mg total) under the skin Every 7 days    triamcinolone acetonide 0.1 % Cream Apply topically    turmeric/turmeric ext/pepr ext (TURMERIC-TURMERIC EXT-PEPPER) 500-3 mg Oral Capsule Take by mouth    vitamin E 400 unit Oral Capsule Take 1 Capsule (400 Units total) by mouth Daily     Immunization History:  Immunization History   Administered Date(s) Administered    Covid-19 Vaccine,Moderna,12 Years+ 08/05/2020    Covid-19 Vaccine,Pfizer-BioNTech,Purple Top,79yrs+ 09/22/2019, 10/13/2019    DIPTH,PERTUSSIS-ACEL,TETANUS >10 YRS OLD 06/13/2012    Flu Family 05/28/2013    High-Dose Influenza Vaccine, 65+ 06/01/2011    Influenza Vaccine, 6 month-adult 06/05/2012, 05/21/2014, 05/28/2015, 05/17/2017, 05/14/2019, 06/01/2023    Tetanus Toxoid/Diphtheria Toxoid/Acellular Pertussis Vaccine, Adsorbed 10/02/2023     Problem List:  Patient Active Problem List    Diagnosis Date Noted    Hepatic cirrhosis, unspecified hepatic cirrhosis type, unspecified whether ascites present (CMS HCC) 06/01/2023    Diabetic polyneuropathy associated with type 2 diabetes mellitus (CMS HCC) 05/01/2023    Obesity 03/10/2023    Benign neoplasm of ascending colon 12/02/2022    Type 2 diabetes mellitus with circulatory disorder 12/02/2022    Polyp of sigmoid colon 12/02/2022    Stenosis of carotid artery  12/02/2022    Left hip pain 11/15/2022    Varicose veins of both lower extremities 09/03/2022    Depression screening negative 03/02/2022    Osteoarthritis of left shoulder 02/26/2022    Family history of kidney cancer 10/21/2021    PMB (postmenopausal bleeding) 04/01/2021    Full thickness rotator cuff tear 01/09/2021    Abdominal aortic atherosclerosis (CMS HCC) 07/09/2020    Mixed stress and urge urinary incontinence 02/28/2020    Essential hypertension 02/20/2020    Hypothyroidism due to Hashimoto's thyroiditis 02/20/2020    Mild intermittent asthma without complication 02/20/2020    Moderate major depression (CMS HCC) 02/20/2020    Gastroesophageal reflux disease without esophagitis 02/20/2020    Mixed hyperlipidemia 02/20/2020    Primary insomnia 02/20/2020    OSA (obstructive sleep apnea) 03/07/2018    Carpal tunnel syndrome, bilateral 11/22/2017    Chronic neck pain 01/24/2017    Osteoarthritis of multiple joints 10/12/2016    Family history of vitamin  B12 deficiency 10/01/2016    GAD (generalized anxiety disorder) 10/01/2016    Recurrent major depressive episodes, moderate (CMS HCC) 10/01/2016    Arthropathy 06/16/2016    History of colonic polyps 06/16/2016    Rectal polyp 06/16/2016    Allergic rhinitis, seasonal 02/09/2016    At risk for injury 02/09/2016    Compulsive tobacco user syndrome 02/09/2016    FOM (frequency of micturition) 02/09/2016    Mononeuropathy of upper extremity 02/09/2016    RLS (restless legs syndrome) 02/09/2016    Centriacinar emphysema (CMS HCC) 05/28/2015    Chronic fatigue 05/28/2015    Neuropathy (CMS HCC) 05/28/2015    Incomplete emptying of bladder 11/11/2014    Fixation hardware in lower extremity 04/23/2014    History of restless legs syndrome 04/22/2014    Ex-smoker 08/13/2013    Cubital tunnel syndrome 08/20/2011    Incomplete uterovaginal prolapse 06/08/2011    Accessory skin tags 06/01/2011       Review of Systems:  ROS neg except as mentioned in HPI.    Objective    Vitals:  Vitals:    02/28/24 1516   Pulse: 95   Resp: 18   Temp: 36.2 C (97.2 F)   SpO2: 95%   Weight: 83 kg (183 lb)   Height: 1.626 m (5' 4)   BMI: 31.41      Height: 162.6 cm (5' 4)  Wt Readings from Last 5 Encounters:   02/28/24 83 kg (183 lb)   02/01/24 83 kg (183 lb)   12/20/23 83 kg (183 lb)   11/24/23 83 kg (183 lb)   10/18/23 85.7 kg (189 lb)         Physical Exam:  General Appearance: appears in good health, comfortable, appears stated age, no distress, and vital signs reviewed  Head: NCAT  Eyes: lids and lashes normal, conjunctivae and sclerae normal and pupils equal, EOM intact  Psychiatric: Normal affect, behavior, memory, thought content, judgement, and speech.  Neurology: normal without focal findings  mental status, speech normal, alert and oriented x iii             Laboratory Studies/Data Reviewed    Nursing Notes:   Gandee, Krista, LPN  91/80/74 [id number]  Signed   Travel Screening       Question Response    Have you been in contact with someone who was sick? --    Do you have any of the following new or worsening symptoms? None of these    Have you traveled internationally in the last month? No          Travel History   Travel since 01/28/24    No documented travel since 01/28/24       Krista Gandee, LPN       Results for orders placed or performed in visit on 02/28/24 (from the past 4 weeks)   CBC/DIFF    Collection Time: 02/28/24  6:16 PM    Narrative    The following orders were created for panel order CBC/DIFF.  Procedure                               Abnormality         Status                     ---------                               -----------         ------  CBC WITH IPQQ[[id number]]                Abnormal            Final result                 Please view results for these tests on the individual orders.   COMPREHENSIVE METABOLIC PANEL, NON-FASTING    Collection Time: 02/28/24  6:16 PM   Result Value Ref Range    SODIUM 141 136 - 145 mmol/L    POTASSIUM 4.5 3.5 -  5.1 mmol/L    CHLORIDE 109 96 - 111 mmol/L    CO2 TOTAL 27 23 - 31 mmol/L    ANION GAP 5 4 - 13 mmol/L    BUN 21 8 - 25 mg/dL    CREATININE 9.03 9.39 - 1.05 mg/dL    BUN/CREA RATIO 22 6 - 22    ESTIMATED GFR - FEMALE 66 >=60 mL/min/BSA    ALBUMIN 3.8 3.4 - 4.8 g/dL    CALCIUM 9.5 8.6 - 89.6 mg/dL    GLUCOSE 90 65 - 874 mg/dL    ALKALINE PHOSPHATASE 71 50 - 130 U/L    ALT (SGPT) 12 <31 U/L    AST (SGOT)  26 11 - 34 U/L    BILIRUBIN TOTAL 0.3 0.3 - 1.3 mg/dL    PROTEIN TOTAL 7.0 6.0 - 8.0 g/dL   PT/INR    Collection Time: 02/28/24  6:16 PM   Result Value Ref Range    PROTHROMBIN TIME 12.7 10.5 - 12.7 seconds    INR 1.10 0.94 - 1.14    Narrative    Coumadin therapy INR range for Conventional Anticoagulation is 2.0 to 3.0 and for Intensive Anticoagulation 2.5 to 3.5.   CBC WITH DIFF    Collection Time: 02/28/24  6:16 PM   Result Value Ref Range    WBC 4.5 3.7 - 11.0 x10^3/uL    RBC 3.73 (L) 3.85 - 5.22 x10^6/uL    HGB 10.0 (L) 11.5 - 16.0 g/dL    HCT 68.7 (L) 65.1 - 46.0 %    MCV 83.6 78.0 - 100.0 fL    MCH 26.8 26.0 - 32.0 pg    MCHC 32.1 31.0 - 35.5 g/dL    RDW-CV 85.3 88.4 - 84.4 %    PLATELETS 110 (L) 150 - 400 x10^3/uL    MPV 11.8 8.7 - 12.5 fL    NEUTROPHIL % 57.7 %    LYMPHOCYTE % 27.4 %    MONOCYTE % 7.9 %    EOSINOPHIL % 6.6 %    BASOPHIL % 0.2 %    NEUTROPHIL # 2.61 1.50 - 7.70 x10^3/uL    LYMPHOCYTE # 1.24 1.00 - 4.80 x10^3/uL    MONOCYTE # 0.36 0.20 - 1.10 x10^3/uL    EOSINOPHIL # 0.30 <=0.50 x10^3/uL    BASOPHIL # <0.10 <=0.20 x10^3/uL    IMMATURE GRANULOCYTE % 0.2 0.0 - 1.0 %    IMMATURE GRANULOCYTE # <0.10 <0.10 x10^3/uL        No results found for this or any previous visit (from the past 720 hours).     Assessment/Plan   Diagnosis    ICD-10-CM    1. PMB (postmenopausal bleeding)  N95.0       2. Preop cardiovascular exam  Z01.810 Referral to Cardiology - [facility] - [facility] - LEWIS, STEPHEN     XR CHEST AP            Discussed options  for treatment. Pt desires hysterectomy      Plan  Plan for RATLH,  possible BSO  Possible AR/PR  Pelvic exam next visit  Send for cardiac clearance    Return for Follow up after testing.      Abrielle Finck E Khylee Algeo, DO         [1]   Allergies  Allergen Reactions    Pseudoephedrine Hcl Hives/ Urticaria    Doxylamin-Pse-Dm-Acetaminophen   Other Adverse Reaction (Add comment), Hives/ Urticaria and Rash    Clindamycin Phosphate     Guaifenesin   Other Adverse Reaction (Add comment)    Cleocin [Clindamycin] Hives/ Urticaria    Tessalon [Benzonatate] Hives/ Urticaria

## 2024-02-28 NOTE — Nursing Note (Signed)
 Travel Screening       Question Response    Have you been in contact with someone who was sick? --    Do you have any of the following new or worsening symptoms? None of these    Have you traveled internationally in the last month? No          Travel History   Travel since 01/28/24    No documented travel since 01/28/24       Shanyiah Conde Gandee, LPN

## 2024-03-01 ENCOUNTER — Other Ambulatory Visit (HOSPITAL_COMMUNITY): Payer: Self-pay | Admitting: INTERNAL MEDICINE

## 2024-03-01 ENCOUNTER — Telehealth (INDEPENDENT_AMBULATORY_CARE_PROVIDER_SITE_OTHER): Payer: Self-pay | Admitting: FAMILY MEDICINE

## 2024-03-01 ENCOUNTER — Other Ambulatory Visit (INDEPENDENT_AMBULATORY_CARE_PROVIDER_SITE_OTHER): Payer: Self-pay | Admitting: FAMILY MEDICINE

## 2024-03-01 DIAGNOSIS — M545 Low back pain, unspecified: Secondary | ICD-10-CM

## 2024-03-01 DIAGNOSIS — R945 Abnormal results of liver function studies: Secondary | ICD-10-CM

## 2024-03-01 MED ORDER — MELOXICAM 15 MG TABLET
15.0000 mg | ORAL_TABLET | Freq: Every day | ORAL | 1 refills | Status: AC | PRN
Start: 2024-03-01 — End: ?

## 2024-03-01 NOTE — Telephone Encounter (Signed)
 Pt called in needing a Reticulocyte test.

## 2024-03-01 NOTE — Telephone Encounter (Signed)
 Pt called in and would also like an xray of her right hand. She thinks she broke her pinky finger after a fall, two weeks ago.

## 2024-03-02 ENCOUNTER — Telehealth (INDEPENDENT_AMBULATORY_CARE_PROVIDER_SITE_OTHER): Payer: Self-pay | Admitting: FAMILY MEDICINE

## 2024-03-02 NOTE — Telephone Encounter (Signed)
 Pt called in office and stated she called in yesterday and spoke with someone twice about her requests. She said she hadnt heard anything back from the office yet and wanted to follow up. Pt is asking if PCP can put in for her to complete a Reticulocyte test. Pt is also asking if PCP can order a  xray of her right hand. She thinks she broke her pinky finger after a fall, two weeks ago.   Pt is asking if someone from PCP office can return her call and let her know what PCP states.     Comments added by Boyd Barrier on 03/02/24 at 11:54.

## 2024-03-04 ENCOUNTER — Other Ambulatory Visit: Payer: Self-pay

## 2024-03-05 ENCOUNTER — Ambulatory Visit (HOSPITAL_COMMUNITY)
Admission: RE | Admit: 2024-03-05 | Discharge: 2024-03-05 | Discharge status: Home / Self Care | Payer: Self-pay | Source: Ambulatory Visit | Attending: INTERNAL MEDICINE

## 2024-03-05 DIAGNOSIS — K8020 Calculus of gallbladder without cholecystitis without obstruction: Secondary | ICD-10-CM | POA: Insufficient documentation

## 2024-03-05 DIAGNOSIS — K838 Other specified diseases of biliary tract: Secondary | ICD-10-CM | POA: Insufficient documentation

## 2024-03-05 DIAGNOSIS — R945 Abnormal results of liver function studies: Secondary | ICD-10-CM | POA: Insufficient documentation

## 2024-03-05 NOTE — Telephone Encounter (Signed)
 Was unsure what to advise pt because I could not see PCP reply.

## 2024-03-06 ENCOUNTER — Telehealth (INDEPENDENT_AMBULATORY_CARE_PROVIDER_SITE_OTHER): Payer: Self-pay | Admitting: FAMILY MEDICINE

## 2024-03-06 NOTE — Telephone Encounter (Signed)
 Bertine called in and wanted to know about the test she had requested, I see there are messages from [facility] in the chart but I can not see your response to them.  She wanted to know if you could order the right hand xray and the reticuloycte testing.    Harlene DELENA Bach, [address] [place]

## 2024-03-06 NOTE — Telephone Encounter (Signed)
 I called Angela Frey and left her a voicemail to call us  back in regards to her requests.    I will try to reach her back tomorrow.     Lauraine Pouch, CMA

## 2024-03-07 NOTE — Telephone Encounter (Signed)
 I called Angela Frey to let her know she can go to the walk in clinic for the xray of her hand as well as the lab work.     I let her know she needs to make an appointment for the x ray and the lab work. She was advised [name] will be out of the office and the scheduled appointment opening are in October.     I booked her for 04/12/2024 @ 2:00 pm to discuss the lab and xray.     Thank you,   Lauraine Pouch, CMA

## 2024-03-11 ENCOUNTER — Other Ambulatory Visit (HOSPITAL_BASED_OUTPATIENT_CLINIC_OR_DEPARTMENT_OTHER): Payer: Self-pay | Admitting: Obstetrics & Gynecology

## 2024-03-25 ENCOUNTER — Ambulatory Visit: Attending: INTERNAL MEDICINE

## 2024-03-25 ENCOUNTER — Other Ambulatory Visit: Payer: Self-pay

## 2024-03-25 DIAGNOSIS — D509 Iron deficiency anemia, unspecified: Secondary | ICD-10-CM | POA: Insufficient documentation

## 2024-03-25 DIAGNOSIS — K760 Fatty (change of) liver, not elsewhere classified: Secondary | ICD-10-CM | POA: Insufficient documentation

## 2024-03-25 LAB — RETICULOCYTE COUNT
IMMATURE RETIC FRACTION: 8 % (ref 2.7–15.9)
RETICULOCYTE % AUTOMATED: 1.53 % (ref 0.50–2.20)
RETICULOCYTE HEMOGLOBIN EQUIVALENT: 29.6 pg (ref 28.0–38.0)
RETICULOCYTES COUNT # AUTOMATED: 57.5 x10ˆ3/uL (ref 17.0–100.0)

## 2024-04-04 ENCOUNTER — Other Ambulatory Visit: Payer: Self-pay

## 2024-04-04 ENCOUNTER — Ambulatory Visit (HOSPITAL_BASED_OUTPATIENT_CLINIC_OR_DEPARTMENT_OTHER): Payer: Self-pay | Admitting: Obstetrics & Gynecology

## 2024-04-05 ENCOUNTER — Encounter (INDEPENDENT_AMBULATORY_CARE_PROVIDER_SITE_OTHER): Payer: Self-pay | Admitting: NURSE PRACTITIONER

## 2024-04-05 ENCOUNTER — Other Ambulatory Visit: Payer: Self-pay

## 2024-04-05 ENCOUNTER — Ambulatory Visit: Payer: Self-pay | Attending: NURSE PRACTITIONER | Admitting: NURSE PRACTITIONER

## 2024-04-05 VITALS — BP 115/65 | HR 84 | Temp 97.5°F | Resp 16 | Ht 64.0 in | Wt 174.0 lb

## 2024-04-05 DIAGNOSIS — G2581 Restless legs syndrome: Secondary | ICD-10-CM | POA: Insufficient documentation

## 2024-04-05 DIAGNOSIS — Z87891 Personal history of nicotine dependence: Secondary | ICD-10-CM | POA: Insufficient documentation

## 2024-04-05 DIAGNOSIS — J4520 Mild intermittent asthma, uncomplicated: Secondary | ICD-10-CM | POA: Insufficient documentation

## 2024-04-05 DIAGNOSIS — G4733 Obstructive sleep apnea (adult) (pediatric): Secondary | ICD-10-CM | POA: Insufficient documentation

## 2024-04-05 DIAGNOSIS — J439 Emphysema, unspecified: Secondary | ICD-10-CM

## 2024-04-05 DIAGNOSIS — J302 Other seasonal allergic rhinitis: Secondary | ICD-10-CM | POA: Insufficient documentation

## 2024-04-05 DIAGNOSIS — J432 Centrilobular emphysema: Secondary | ICD-10-CM | POA: Insufficient documentation

## 2024-04-05 MED ORDER — BUDESONIDE-FORMOTEROL HFA 160 MCG-4.5 MCG/ACTUATION AEROSOL INHALER
2.0000 | INHALATION_SPRAY | Freq: Two times a day (BID) | RESPIRATORY_TRACT | 11 refills | Status: AC
Start: 2024-04-05 — End: ?
  Filled 2024-04-05 – 2024-06-05 (×2): qty 10.2, 30d supply, fill #0

## 2024-04-05 MED ORDER — ALBUTEROL SULFATE HFA 90 MCG/ACTUATION AEROSOL INHALER
1.0000 | INHALATION_SPRAY | RESPIRATORY_TRACT | 11 refills | Status: AC | PRN
Start: 2024-04-05 — End: ?
  Filled 2024-04-05 – 2024-06-05 (×2): qty 8.5, 17d supply, fill #0

## 2024-04-05 MED ORDER — TIOTROPIUM BROMIDE 18 MCG CAPSULE WITH INHALATION DEVICE
18.0000 ug | ORAL_CAPSULE | Freq: Every day | RESPIRATORY_TRACT | 11 refills | Status: AC
Start: 2024-04-05 — End: ?
  Filled 2024-04-05: qty 30, 30d supply, fill #0

## 2024-04-05 NOTE — Nursing Note (Signed)
 Is the patient currently on oxygen therapy? No  What Liter Flow is the patient on? N/A  What DME company does the patient utilize for oxygen therapy? N/A  What is the patient on? n/a  What DME company does the patient utilize for CPAP/BiPAP/Trilogy? N/A  Smoking History:    Tobacco Use History[1]   Have you received a flu shot? Yes  Have you received a pneumonia shot? NOT SURE  Has the patient had any tests performed since the last visit? None  If so, where were the test(s) performed? N/A  Has the patient had any recent hospitalizations? No  Does the patient have any other associated symptoms or modifying factors? Shortness of breath, Wheezing, and Coughing             [1]   Social History  Tobacco Use   Smoking Status Former    Current packs/day: 0.00    Average packs/day: 1.3 packs/day for 40.0 years (50.0 ttl pk-yrs)    Types: Cigarettes    Start date: 59    Quit date: 2015    Years since quitting: 10.7   Smokeless Tobacco Never
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
## 2024-04-05 NOTE — Progress Notes (Deleted)
 PULMONARY, [facility] PULMONOLOGY  [address]  [place] [address]  Operated by [facility]     Follow up/Progress Note    Patient Name: Angela Frey  Date: 04/05/2024  Department:  PULMONARY, [facility] PULMONOLOGY  MRN: [id number]  DOB: June 03, 1961  Primary Care Provider:  Dorian MARLA Dama, DO  Referring Provider:  Dorian MARLA Dama, DO      Chief Complaint:   Chief Complaint   Patient presents with    Follow Up     Nursing Notes:   Sammy Browning, [place]  04/05/24 1353  Signed  Is the patient currently on oxygen therapy? No  What Liter Flow is the patient on? N/A  What DME company does the patient utilize for oxygen therapy? N/A  What is the patient on? n/a  What DME company does the patient utilize for CPAP/BiPAP/Trilogy? N/A  Smoking History:    Tobacco Use History[1]   Have you received a flu shot? Yes  Have you received a pneumonia shot? NOT SURE  Has the patient had any tests performed since the last visit? None  If so, where were the test(s) performed? N/A  Has the patient had any recent hospitalizations? No  Does the patient have any other associated symptoms or modifying factors? Shortness of breath, Wheezing, and Coughing          HPI:   ***          Past Medical History:  Past Medical History:   Diagnosis Date    Asthma     Centrilobular emphysema (CMS HCC)     Chronic obstructive airway disease     Chronic respiratory failure with hypoxia (CMS HCC)     Hypertension     Hypothyroidism     OSA (obstructive sleep apnea)     SOB (shortness of breath)     Type 2 diabetes mellitus       Past Surgical History  Past Surgical History:   Procedure Laterality Date    HX PELVIC LAPAROSCOPY      HX TONSILLECTOMY      HX TUBAL LIGATION      LEG SURGERY      2-3 surgeries    NECK SURGERY      TUBOPLASTY / TUBOTUBAL ANASTOMOSIS      ULNAR NERVE REPAIR Bilateral       Medication List  Current Outpatient Medications   Medication Sig    albuterol  sulfate (PROVENTIL  OR VENTOLIN  OR  PROAIR ) 90 mcg/actuation Inhalation oral inhaler Take 1-2 Puffs by inhalation Every 4 hours as needed    albuterol  sulfate (PROVENTIL ) 2.5 mg /3 mL (0.083 %) Inhalation nebulizer solution Take 3 mL (2.5 mg total) by nebulization Every 4 hours as needed for Wheezing    amitriptyline  (ELAVIL ) 25 mg Oral Tablet Take 1 Tablet (25 mg total) by mouth Every night    aspirin 81 mg Oral Tablet, Chewable Chew 1 Tablet (81 mg total) Daily    baclofen  (LIORESAL ) 10 mg Oral Tablet Take 1 Tablet (10 mg total) by mouth Four times a day Indications: muscle spasms caused by a spinal disease    Blood Sugar Diagnostic (ACCU-CHEK GUIDE TEST STRIPS) Does not apply Strip 1 Strip Four times a day    Blood-Glucose Meter Misc 4 times per day    budesonide -formoteroL  (SYMBICORT ) 160-4.5 mcg/actuation Inhalation oral inhaler Take 2 Puffs by inhalation Twice daily    calcium carbonate-vitamin D3 (CALCIUM 600 WITH VITAMIN D3) 600 mg-12.5 mcg (500 unit)  Oral Capsule Take by mouth    cholecalciferol, Vitamin D3, 125 mcg (5,000 unit) Oral Tablet Take by oral route.    clobetasoL  (TEMOVATE ) 0.05 % Ointment APPLY A PEA SIZED AMOUNT TO GLUTEAL CLEFT EVERY MONDAY AND THURSDAY AS DIRECTED    cyanocobalamin (VITAMIN B 12) 1,000 mcg Oral Tablet Take 1 Tablet (1,000 mcg total) by mouth Daily    esomeprazole  magnesium  (NEXIUM ) 40 mg Oral Capsule, Delayed Release(E.C.) TAKE 1 CAPSULE BY MOUTH IN THE MORNING BEFORE BREAKFAST    fluorouraciL (EFUDEX) 5 % Cream Apply topically    folic acid (FOLVITE) 1 mg Oral Tablet Take 1 Tablet (1 mg total) by mouth Daily    gabapentin  (NEURONTIN ) 600 mg Oral Tablet Take 1 Tablet (600 mg total) by mouth Three times a day    hydroCHLOROthiazide  (HYDRODIURIL ) 25 mg Oral Tablet Take 1 Tablet (25 mg total) by mouth Daily    ketoconazole  (NIZORAL ) 2 % Shampoo USE 1/2 TO 1 (ONE-HALF TO ONE) OUNCE OF SHAMPOO TOPICALLY THREE TIMES A WEEK    Lactobac no.41/Bifidobact no.7 (PROBIOTIC-10 ORAL) Take by mouth Once a day    Lancets  (ACCU-CHEK SOFTCLIX LANCETS) Misc 1 Each Four times a day    levothyroxine  (SYNTHROID) 175 mcg Oral Tablet Take 1 Tablet (175 mcg total) by mouth Every morning    lisinopriL  (PRINIVIL ) 20 mg Oral Tablet TAKE 1 TABLET(20 MG) BY MOUTH DAILY    magnesium  Oxide 420 mg Oral Tablet Take 1 Tablet (420 mg total) by mouth Daily    meloxicam  (MOBIC ) 15 mg Oral Tablet Take 1 Tablet (15 mg total) by mouth Once per day as needed for Pain    MILK THISTLE ORAL Take by mouth    nitrofurantoin  monohyd/m-cryst (MACROBID ) 100 mg Oral Capsule Take 1 Capsule (100 mg total) by mouth Twice daily    Norethindrone , Contraceptive, (equiv to: ORTHO MICRONOR ) 0.35 mg Oral tablet Take 1 Tablet (0.35 mg total) by mouth Daily    nystatin  (NYSTOP ) 100,000 unit/gram Powder by Apply Topically route Three times a day as needed    pediatric multivitamins Oral Tablet, Chewable Chew 1 Tablet Daily    pravastatin  (PRAVACHOL ) 10 mg Oral Tablet Take 1 Tablet (10 mg total) by mouth Every evening    sertraline  (ZOLOFT ) 100 mg Oral Tablet Take 1 Tablet (100 mg total) by mouth Daily    tiotropium bromide  (SPIRIVA  HANDIHALER) 18 mcg Inhalation Capsule, w/Inhalation Device Take 1 Capsule (18 mcg total) by inhalation Once a day    tirzepatide  (MOUNJARO ) 12.5 mg/0.5 mL Subcutaneous Pen Injector Inject 0.5 mL (12.5 mg total) under the skin Every 7 days    tolterodine (DETROL LA) 4 mg Oral Capsule, Sust. Release 24 hr     triamcinolone acetonide 0.1 % Cream Apply topically    turmeric/turmeric ext/pepr ext (TURMERIC-TURMERIC EXT-PEPPER) 500-3 mg Oral Capsule Take by mouth    vitamin E 400 unit Oral Capsule Take 1 Capsule (400 Units total) by mouth Daily     Allergy List  Allergy History as of 04/05/24       CLINDAMYCIN         Noted Status Severity Type Reaction    09/10/19 1855 Johnny Richerd CROME, NP 09/10/19 Active Low  Hives/ Urticaria              DOXYLAMIN-PSE-DM-ACETAMINOPHEN          Noted Status Severity Type Reaction    06/01/23 0849 Louise Jenny, CMA  09/10/19 Active Medium   Other Adverse Reaction (Add comment), Hives/ Urticaria,  Rash    07/09/20 0731 Clayborne Daphne SAUNDERS, LPN 96/98/78 Active Low  Hives/ Urticaria,  Other Adverse Reaction (Add comment)    09/10/19 1857 Johnny Richerd CROME, NP 09/10/19 Active Low  Hives/ Urticaria              BENZONATATE         Noted Status Severity Type Reaction    02/20/20 1313 Clayborne Daphne SAUNDERS, LPN 91/88/78 Active Low  Hives/ Urticaria              CLINDAMYCIN PHOSPHATE         Noted Status Severity Type Reaction    11/04/20 1619 Overmiller, Lupita CROME, MD 11/04/20 Active                 PSEUDOEPHEDRINE HCL         Noted Status Severity Type Reaction    04/23/22 1357 Plumley, Johnye, CMA 11/11/14 Active High  Hives/ Urticaria              GUAIFENESIN          Noted Status Severity Type Reaction    04/23/22 1357 Plumley, Johnye, CMA 05/05/18 Active    Other Adverse Reaction (Add comment)                  Family History   Family Medical History:       Problem Relation (Age of Onset)    Arthritis-osteo Mother, Father    Asthma Mother    Blood Clots Father    Congestive Heart Failure Father    Coronary Artery Disease Father    Heart Attack Father    High Cholesterol Father    Hypertension (High Blood Pressure) Mother, Father    Migraines Mother    Sleep disorders Father    Thyroid  Disease Mother            Social History  Social History     Socioeconomic History    Marital status: Widowed   Tobacco Use    Smoking status: Former     Current packs/day: 0.00     Average packs/day: 1.3 packs/day for 40.0 years (50.0 ttl pk-yrs)     Types: Cigarettes     Start date: 74     Quit date: 2015     Years since quitting: 10.7    Smokeless tobacco: Never   Vaping Use    Vaping status: Never Used   Substance and Sexual Activity    Alcohol use: Never    Drug use: Never    Sexual activity: Not Currently     Birth control/protection: Post-menopausal     Social Determinants of Health     Financial Resource Strain: Low Risk (11/24/2023)    Financial Resource  Strain     SDOH Financial: No   Transportation Needs: Low Risk (11/24/2023)    Transportation Needs     SDOH Transportation: No   Social Connections: Low Risk (11/24/2023)    Social Connections     SDOH Social Isolation: 5 or more times a week   Intimate Partner Violence: Low Risk (11/24/2023)    Intimate Partner Violence     SDOH Domestic Violence: No   Housing Stability: Low Risk (11/24/2023)    Housing Stability     SDOH Housing Situation: I have housing.     SDOH Housing Worry: No        Review of system  All negative other than listed in HPI.    Objective:  Vital  Signs  Vitals:    04/05/24 1343   BP: 115/65   Pulse: 84   Resp: 16   Temp: 36.4 C (97.5 F)   SpO2: 94%   Weight: 78.9 kg (174 lb)   Height: 1.626 m (5' 4)   BMI: 29.87      PHYSICAL EXAMINATION:   Constitutional:  Vital signs stable.  General appearance of the patient:  Alert, no acute distress.  Normal appearance, well nourished.  Neck: Supple with trachea midline, non tender, no nodules, no masses, gland position midline.  Respiratory:  Auscultation of lungs with normal breath sounds, no rales, no rhonchi, no wheezing.  Respiratory effort with no tractions, breathing regular and unlabored.  Cardiovascular:  Regular rhythm and regular rate.  No murmur, no peripheral edema.  Gastrointestinal: Abdomen non-tender, no masses, no hepatomegaly present.  Musculoskeletal:  Normal gait and station, normal digits, no digital cyanosis or clubbing.  Mental Status/Psychiatric:  Alert, grossly oriented to person, place, and time.  Appropriate and normal mood.        Assessment  No diagnosis found.        Plan              {wwdxcode:52355}    Elsie Marsa Gully, MD  04/05/2024 14:00    Instructions  Continue medications as prescribed/directed unless changed by provider.  Plan of care discussed with patient.    No follow-ups on file.    The patient was given the opportunity to ask questions and those questions were answered to the patient's satisfaction. The  patient was encouraged to call with any additional questions or concerns. Discussed with the patient effects and side effects of medications. Medication safety was discussed.  The patient was informed to contact the office within 7 business days if a message/lab results/referral/imaging results have not been conveyed to the patient.    Electronically signed by Elsie Marsa Gully, MD      This note may have been partially generated using MModal Fluency Direct system, and there may be some incorrect words, spellings, and punctuation that were not noted in checking the note before saving.       [1]   Social History  Tobacco Use   Smoking Status Former    Current packs/day: 0.00    Average packs/day: 1.3 packs/day for 40.0 years (50.0 ttl pk-yrs)    Types: Cigarettes    Start date: 18    Quit date: 2015    Years since quitting: 10.7   Smokeless Tobacco Never
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
## 2024-04-09 ENCOUNTER — Ambulatory Visit: Payer: Self-pay | Attending: INTERVENTIONAL CARDIOLOGY | Admitting: INTERVENTIONAL CARDIOLOGY

## 2024-04-09 ENCOUNTER — Other Ambulatory Visit: Payer: Self-pay

## 2024-04-09 ENCOUNTER — Encounter (HOSPITAL_BASED_OUTPATIENT_CLINIC_OR_DEPARTMENT_OTHER): Payer: Self-pay | Admitting: INTERVENTIONAL CARDIOLOGY

## 2024-04-09 DIAGNOSIS — I10 Essential (primary) hypertension: Secondary | ICD-10-CM | POA: Insufficient documentation

## 2024-04-09 DIAGNOSIS — Z01810 Encounter for preprocedural cardiovascular examination: Secondary | ICD-10-CM | POA: Insufficient documentation

## 2024-04-09 DIAGNOSIS — Z87891 Personal history of nicotine dependence: Secondary | ICD-10-CM | POA: Insufficient documentation
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
## 2024-04-12 ENCOUNTER — Ambulatory Visit (HOSPITAL_COMMUNITY)
Admission: RE | Admit: 2024-04-12 | Discharge: 2024-04-12 | Disposition: A | Discharge status: Home / Self Care | Source: Ambulatory Visit | Attending: INTERVENTIONAL CARDIOLOGY | Admitting: INTERVENTIONAL CARDIOLOGY

## 2024-04-12 ENCOUNTER — Ambulatory Visit: Payer: Self-pay | Attending: FAMILY MEDICINE | Admitting: FAMILY MEDICINE

## 2024-04-12 ENCOUNTER — Other Ambulatory Visit (INDEPENDENT_AMBULATORY_CARE_PROVIDER_SITE_OTHER): Payer: Self-pay | Admitting: FAMILY MEDICINE

## 2024-04-12 ENCOUNTER — Encounter (HOSPITAL_BASED_OUTPATIENT_CLINIC_OR_DEPARTMENT_OTHER): Payer: Self-pay | Admitting: Obstetrics & Gynecology

## 2024-04-12 ENCOUNTER — Ambulatory Visit (HOSPITAL_BASED_OUTPATIENT_CLINIC_OR_DEPARTMENT_OTHER): Admitting: Obstetrics & Gynecology

## 2024-04-12 ENCOUNTER — Other Ambulatory Visit: Payer: Self-pay

## 2024-04-12 ENCOUNTER — Encounter (INDEPENDENT_AMBULATORY_CARE_PROVIDER_SITE_OTHER): Payer: Self-pay | Admitting: FAMILY MEDICINE

## 2024-04-12 VITALS — BP 108/72 | HR 89 | Temp 97.5°F | Ht 64.0 in | Wt 174.0 lb

## 2024-04-12 VITALS — BP 110/66 | HR 85 | Temp 97.1°F | Resp 18 | Ht 64.0 in | Wt 174.4 lb

## 2024-04-12 DIAGNOSIS — Z01810 Encounter for preprocedural cardiovascular examination: Secondary | ICD-10-CM

## 2024-04-12 DIAGNOSIS — E1029 Type 1 diabetes mellitus with other diabetic kidney complication: Secondary | ICD-10-CM

## 2024-04-12 DIAGNOSIS — N950 Postmenopausal bleeding: Secondary | ICD-10-CM

## 2024-04-12 DIAGNOSIS — R102 Pelvic and perineal pain unspecified side: Secondary | ICD-10-CM | POA: Insufficient documentation

## 2024-04-12 DIAGNOSIS — M549 Dorsalgia, unspecified: Secondary | ICD-10-CM | POA: Insufficient documentation

## 2024-04-12 DIAGNOSIS — R0789 Other chest pain: Secondary | ICD-10-CM | POA: Insufficient documentation

## 2024-04-12 DIAGNOSIS — N816 Rectocele: Secondary | ICD-10-CM | POA: Insufficient documentation

## 2024-04-12 DIAGNOSIS — Z7985 Long-term (current) use of injectable non-insulin antidiabetic drugs: Secondary | ICD-10-CM | POA: Insufficient documentation

## 2024-04-12 DIAGNOSIS — W19XXXS Unspecified fall, sequela: Secondary | ICD-10-CM

## 2024-04-12 DIAGNOSIS — E1165 Type 2 diabetes mellitus with hyperglycemia: Secondary | ICD-10-CM | POA: Insufficient documentation

## 2024-04-12 DIAGNOSIS — M542 Cervicalgia: Secondary | ICD-10-CM | POA: Insufficient documentation

## 2024-04-12 DIAGNOSIS — Z01818 Encounter for other preprocedural examination: Secondary | ICD-10-CM | POA: Insufficient documentation

## 2024-04-12 DIAGNOSIS — D649 Anemia, unspecified: Secondary | ICD-10-CM | POA: Insufficient documentation

## 2024-04-12 DIAGNOSIS — M79644 Pain in right finger(s): Secondary | ICD-10-CM | POA: Insufficient documentation

## 2024-04-12 DIAGNOSIS — Z794 Long term (current) use of insulin: Secondary | ICD-10-CM | POA: Insufficient documentation

## 2024-04-12 DIAGNOSIS — M5030 Other cervical disc degeneration, unspecified cervical region: Secondary | ICD-10-CM | POA: Insufficient documentation

## 2024-04-12 DIAGNOSIS — E1142 Type 2 diabetes mellitus with diabetic polyneuropathy: Secondary | ICD-10-CM

## 2024-04-12 DIAGNOSIS — E1129 Type 2 diabetes mellitus with other diabetic kidney complication: Secondary | ICD-10-CM

## 2024-04-12 DIAGNOSIS — E782 Mixed hyperlipidemia: Secondary | ICD-10-CM | POA: Insufficient documentation

## 2024-04-12 DIAGNOSIS — R5383 Other fatigue: Secondary | ICD-10-CM | POA: Insufficient documentation

## 2024-04-12 MED ORDER — PRAVASTATIN 10 MG TABLET: 10.0000 mg | ORAL_TABLET | Freq: Every evening | ORAL | 1 refills | Status: DC

## 2024-04-12 MED ORDER — MOUNJARO 12.5 MG/0.5 ML SUBCUTANEOUS PEN INJECTOR
12.5000 mg | PEN_INJECTOR | SUBCUTANEOUS | 3 refills | Status: AC
Start: 2024-04-12 — End: ?
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
## 2024-04-12 NOTE — Progress Notes (Signed)
 FAMILY MEDICINE, MEDICAL OFFICE BUILDING [address]  [address]  [place] [address]      Operated by [facility]     Name: Angela Frey MRN:  [id number]   Date: 04/12/2024 Age: 63 y.o.      Chief Complaint   Patient presents with    Back Pain    Finger Pain    Neck Pain    Breast Reduction    Vaccination     PT refuses flu shot today       SUBJECTIVE    Patient is 63 y.o. she  presents today for complaint of back pain, finger pain, neck pain and she wants a breast reduction.   She saw Dr.Stapleton she says she has not heard back from him.  She has a follow-up in February per patient.she is complaining of fatigue and she thinks her anemia is worse she tired all the time. She denies any blood in urine or stool.   She is complaining back pain I discussed her previous x-ray her lumbar x-rays while the cervical x-ray as well as an ankle x-ray in May.  She reports she went to the orthopedic doctor [place] for her foot I want her to call them again to schedule follow-up.  When she went to the orthopedic doctor she also told him she wanted them do check her back but they told her that at the different type of doctor and she would need to see that type of doctor.  I will refer her to ortho neuro for evaluation along with x-rays of her cervical and lumbar spine.  I advise her with the hamstring tear physical therapy would be suggested.  She says her right foot is numb and she has had surgery on the right foot in the past her last [place] was [place].  She has been to ortho in [place] for her right foot and shoulder she needs to call.   She has neck pain.  She says her neck cracks and pops all the time it hurts all the time.  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  She wants to see the next specialist I have made that referral today.  Advised her physical therapy will help.  Will add she also wants the skin on her stomach removed I advised  her that I do not all who would do that she would need to let me know you want refer to but also her insurance will probably not pay for it.  Refuse flu shot  She reports she fell and she thinks she broke her finger pinky finger on the right.  She reports that is why sizes other fingers she can not bend it will order x-ray today to see if there is a fracture.  I advised her that really nothing to be done for most fractures but I will call her with the results.  She reports she is going to get a hysterectomy. She says she is also going to get Botox injections in her bladder again and a bladder lift.    ROS:      ROS - pertinent for presenting problem.  Systems otherwise negative than what has been noted.    PAST MEDICAL HISTORY  I have reviewed and updated as appropriate the past medical, surgical, family, and social history today:    Medical History/Surgical History/Family History/Social History  Past Medical History:   Diagnosis Date    Asthma  Centrilobular emphysema     Chronic obstructive airway disease     Chronic respiratory failure with hypoxia (CMS HCC)     Hypertension     Hypothyroidism     OSA (obstructive sleep apnea)     SOB (shortness of breath)     Type 2 diabetes mellitus          Past Surgical History:   Procedure Laterality Date    HX PELVIC LAPAROSCOPY      HX TONSILLECTOMY      HX TUBAL LIGATION      HYSTEROSCOPY WITH DILATION AND CURETTAGE N/A 04/09/2021    Performed by Cathryne Sherryle BRAVO, DO at [facility]    LEG SURGERY      2-3 surgeries    NECK SURGERY      TUBOPLASTY / TUBOTUBAL ANASTOMOSIS      ULNAR NERVE REPAIR Bilateral      Family Medical History:       Problem Relation (Age of Onset)    Arthritis-osteo Mother, Father    Asthma Mother    Blood Clots Father    Congestive Heart Failure Father    Coronary Artery Disease Father    Heart Attack Father    High Cholesterol Father    Hypertension (High Blood Pressure) Mother, Father    Migraines Mother    Sleep disorders Father    Thyroid   Disease Mother             Social History     Socioeconomic History    Marital status: Widowed   Tobacco Use    Smoking status: Former     Current packs/day: 0.00     Average packs/day: 1.3 packs/day for 40.0 years (50.0 ttl pk-yrs)     Types: Cigarettes     Start date: 50     Quit date: 2015     Years since quitting: 10.7    Smokeless tobacco: Never   Vaping Use    Vaping status: Never Used   Substance and Sexual Activity    Alcohol use: Never    Drug use: Never    Sexual activity: Not Currently     Birth control/protection: Post-menopausal     Social Determinants of Health     Financial Resource Strain: Low Risk (11/24/2023)    Financial Resource Strain     SDOH Financial: No   Transportation Needs: Low Risk (11/24/2023)    Transportation Needs     SDOH Transportation: No   Social Connections: Low Risk (11/24/2023)    Social Connections     SDOH Social Isolation: 5 or more times a week   Intimate Partner Violence: Low Risk (11/24/2023)    Intimate Partner Violence     SDOH Domestic Violence: No   Housing Stability: Low Risk (11/24/2023)    Housing Stability     SDOH Housing Situation: I have housing.     SDOH Housing Worry: No       Allergies:  Allergies   Allergen Reactions    Pseudoephedrine Hcl Hives/ Urticaria    Doxylamin-Pse-Dm-Acetaminophen   Other Adverse Reaction (Add comment), Hives/ Urticaria and Rash    Clindamycin Phosphate     Guaifenesin   Other Adverse Reaction (Add comment)    Cleocin [Clindamycin] Hives/ Urticaria    Tessalon [Benzonatate] Hives/ Urticaria       Medications:  Current Outpatient Medications   Medication Sig    albuterol  sulfate (PROVENTIL  OR VENTOLIN  OR PROAIR ) 90 mcg/actuation Inhalation oral inhaler Take 1-2 Puffs  by inhalation Every 4 hours as needed    albuterol  sulfate (PROVENTIL ) 2.5 mg /3 mL (0.083 %) Inhalation nebulizer solution Take 3 mL (2.5 mg total) by nebulization Every 4 hours as needed for Wheezing    amitriptyline  (ELAVIL ) 25 mg Oral Tablet Take 1 Tablet (25 mg total)  by mouth Every night    aspirin 81 mg Oral Tablet, Chewable Chew 1 Tablet (81 mg total) Daily    baclofen  (LIORESAL ) 10 mg Oral Tablet Take 1 Tablet (10 mg total) by mouth Four times a day Indications: muscle spasms caused by a spinal disease    Blood Sugar Diagnostic (ACCU-CHEK GUIDE TEST STRIPS) Does not apply Strip 1 Strip Four times a day    Blood-Glucose Meter Misc 4 times per day    budesonide -formoteroL  (SYMBICORT ) 160-4.5 mcg/actuation Inhalation oral inhaler Take 2 Puffs by inhalation Twice daily    calcium carbonate-vitamin D3 (CALCIUM 600 WITH VITAMIN D3) 600 mg-12.5 mcg (500 unit) Oral Capsule Take by mouth    cefuroxime (CEFTIN) 500 mg Oral Tablet Take 1 Tablet (500 mg total) by mouth Twice daily    cholecalciferol, Vitamin D3, 125 mcg (5,000 unit) Oral Tablet Take by oral route.    clobetasoL  (TEMOVATE ) 0.05 % Ointment APPLY A PEA SIZED AMOUNT TO GLUTEAL CLEFT EVERY MONDAY AND THURSDAY AS DIRECTED    cyanocobalamin (VITAMIN B 12) 1,000 mcg Oral Tablet Take 1 Tablet (1,000 mcg total) by mouth Daily    esomeprazole  magnesium  (NEXIUM ) 40 mg Oral Capsule, Delayed Release(E.C.) TAKE 1 CAPSULE BY MOUTH IN THE MORNING BEFORE BREAKFAST    folic acid (FOLVITE) 1 mg Oral Tablet Take 1 Tablet (1 mg total) by mouth Daily    gabapentin  (NEURONTIN ) 600 mg Oral Tablet Take 1 Tablet (600 mg total) by mouth Three times a day    hydroCHLOROthiazide  (HYDRODIURIL ) 25 mg Oral Tablet Take 1 Tablet (25 mg total) by mouth Daily    ketoconazole  (NIZORAL ) 2 % Shampoo USE 1/2 TO 1 (ONE-HALF TO ONE) OUNCE OF SHAMPOO TOPICALLY THREE TIMES A WEEK    Lactobac no.41/Bifidobact no.7 (PROBIOTIC-10 ORAL) Take by mouth Once a day    Lancets (ACCU-CHEK SOFTCLIX LANCETS) Misc 1 Each Four times a day    levothyroxine  (SYNTHROID ) 175 mcg Oral Tablet Take 1 Tablet (175 mcg total) by mouth Every morning    lisinopriL  (PRINIVIL ) 20 mg Oral Tablet TAKE 1 TABLET(20 MG) BY MOUTH DAILY    magnesium  Oxide 420 mg Oral Tablet Take 1 Tablet (420 mg  total) by mouth Daily    meloxicam  (MOBIC ) 15 mg Oral Tablet Take 1 Tablet (15 mg total) by mouth Once per day as needed for Pain    MILK THISTLE ORAL Take by mouth    Norethindrone , Contraceptive, (equiv to: ORTHO MICRONOR ) 0.35 mg Oral tablet Take 1 Tablet (0.35 mg total) by mouth Daily    nystatin  (NYSTOP ) 100,000 unit/gram Powder by Apply Topically route Three times a day as needed    pediatric multivitamins Oral Tablet, Chewable Chew 1 Tablet Daily    pravastatin  (PRAVACHOL ) 10 mg Oral Tablet Take 1 Tablet (10 mg total) by mouth Every evening    sertraline  (ZOLOFT ) 100 mg Oral Tablet Take 1 Tablet (100 mg total) by mouth Daily    tiotropium bromide  (SPIRIVA  HANDIHALER) 18 mcg Inhalation Capsule, w/Inhalation Device Inhale 1 Capsule (18 mcg total) by inhalation via handi-haler Daily    tirzepatide  (MOUNJARO ) 12.5 mg/0.5 mL Subcutaneous Pen Injector Inject 0.5 mL (12.5 mg total) under the skin Every 7  days    tolterodine (DETROL LA) 4 mg Oral Capsule, Sust. Release 24 hr     triamcinolone acetonide 0.1 % Cream Apply topically    turmeric/turmeric ext/pepr ext (TURMERIC-TURMERIC EXT-PEPPER) 500-3 mg Oral Capsule Take by mouth    vitamin E 400 unit Oral Capsule Take 1 Capsule (400 Units total) by mouth Daily    VITAMIN E ORAL Take by mouth       Immunizations:  Immunization History   Administered Date(s) Administered    Covid-19 Vaccine,Moderna,12 Years+ 08/05/2020    Covid-19 Vaccine,Pfizer-BioNTech,Purple Top,26yrs+ 09/22/2019, 10/13/2019    DIPTH,PERTUSSIS-ACEL,TETANUS >10 YRS OLD 06/13/2012    Flu Family 05/28/2013    High-Dose Influenza Vaccine, 65+ 06/01/2011    Influenza Vaccine, 6 month-adult 06/05/2012, 05/21/2014, 05/28/2015, 05/17/2017, 05/14/2019, 06/01/2023    Tetanus Toxoid/Diphtheria Toxoid/Acellular Pertussis Vaccine, Adsorbed 10/02/2023       OBJECTIVE    BP 108/72 (Patient Position: Sitting)   Pulse 89   Temp 36.4 C (97.5 F)   Ht 1.626 m (5' 4)   Wt 78.9 kg (174 lb)   LMP  (LMP Unknown)  Comment: stopped having periods at aget 44-46  SpO2 98%   BMI 29.87 kg/m       Physical Exam:  General: Pleasant, WNWD, NAD, AAO  Head: Normocephalic, AT,no lesions. Bruise on right check.    Eyes: PERL, EOM's full, conjunctivae clear, fundi grossly normal.   Nose:  no rhinnorhea noted, no lesions  Throat: Clear, no exudates, no lesions MMM and pink.  Neck: Supple, no masses, no thyromegaly, no bruits.   Chest: Lungs clear, no rales, no rhonchi, no wheezes.   Heart: RRR, no murmurs, no rubs, no gallops.   Abdomen: Soft, no tenderness, no masses, BS normal.    Extremities: Normal gait, no deformities, 5th digit right hand edema.    Neuro: Physiological, no localizing findings.   Skin:  Normal PWD,  warts on finger and dry skin otherwise  Psych: Mood and Affect normal      ASSESSMENT:        ICD-10-CM    1. Fatigue, unspecified type  R53.83 VITAMIN B12     IRON     CBC/DIFF      2. Anemia  D64.9 IRON     CBC/DIFF      3. Neck pain  M54.2 Refer to External Provider     Refer to External Provider      4. DDD (degenerative disc disease), cervical  M50.30 Refer to External Provider     Refer to External Provider      5. Back pain, unspecified back location, unspecified back pain laterality, unspecified chronicity  M54.9 Refer to External Provider      6. Pain of finger of right hand  M79.644 XR FINGER, 5TH/SMALL RIGHT      7. Fall, sequela  W19.XXXS * XR RIBS RIGHT W PA/AP CHEST      8. Rib pain on right side  R07.89 * XR RIBS RIGHT W PA/AP CHEST      9. Uncontrolled type 2 diabetes mellitus with hyperglycemia (CMS HCC)  E11.65 pravastatin  (PRAVACHOL ) 10 mg Oral Tablet      10. Mixed hyperlipidemia  E78.2 pravastatin  (PRAVACHOL ) 10 mg Oral Tablet            Body mass index is 29.87 kg/m. BMI addressed: Advised on diet, weight loss, and exercise to reduce above normal BMI.        Laboratory studies/data reviewed  I have reviewed all  available and pertinent laboratory studies, images and health  maintenance.    PLAN:    Records reviewed today:  Refill needed meds. ATTACHED  Pt was educated/ counseled on the decisions made today with their involvement in these plans.  Immunizations:  Encourage COVID booster  Preventive counseling:  Continue to monitor blood sugars and blood pressures  Diet and exercise reviewed  See dentist and eye doctor with regular scheduled visits.  Active listening/Asked pertinent questions.    A total of (36) minutes was spent on this patient encounter including review of historical information, examination, documentation and post visit activities.  Follow-up with all specialists  Refill hydrochlorothiazide  take as directed hypertension monitor BP at home and in office   Continue levothyroxine  take as directed hypothyroidism monitor lab  Continue  Zoloft  take as directed mood continue to monitor  continue meloxicam  take as directed the right is pool follow-up back scheduled follow-up with your ankle special as will  Continue Nexium  take as directed GERD monitor for symptom control  continue Elavil  can directed week goal to monitor for symptom control  continue gabapentin  take as directed pull will monitor for symptom control  Refill pravastatin  take as directed DMII and hld continue to monitor labs fasting   Keep appointment with Dr. Stana GI as scheduled  Follow up with [name] as scheduled   Order cbc, b12 and iron will call with results history anemia and worse fatigue  Order xray 5th digit right had fall   Order right rib xrays rib pain and pain with breathing  Refer to ortho neuro [place] for neck and back pain and leg numbness   Call ortho in [place] so schedule fu for ankle evaluation right   Refer to breast center for evaluation for breast reduction   Follow-up pulmonology as scheduled  Reviewed and discussed lumbar xray, cervical xray 11/24/2023  Reviewed and discussed hip xray 10/18/2023  Reviewed and discussed right ankle xry 10/18/2023  Reviewed labs done 03/25/2024  pt INR, cbc, cmp reticulocytes   Continue all prescribed medications as directed    BMI addressed: Advised on diet, weight loss, and exercise to reduce above normal BMI.      FOLLOW UP:  Return for as scheduled .    Patient can return sooner if needed.      The patient/care give was given ample opportunity to ask questions and those questions were answered to his/her satisfaction. A good faith effort was made to reconcile the patient's medications. The patient/caregiver was counseled on any appropriate vaccinations by the provider and questions were answered. The patient/care giver was told to contact me with any additional questions or concerns, or go to the ED in an emergency.       Dorian MARLA Dama, DO. 04/12/24 15:42  [facility]  [facility]  [address]  [place], [place] [address]  Phone [phone]    This note may have been partially generated using M-Modal Fluency Direct system, and there may be some incorrect words, spellings, and punctuation that were not noted in checking the note before saving.

## 2024-04-12 NOTE — Nursing Note (Signed)
 Travel Screening       Question Response    Have you been in contact with someone who was sick? No / Unsure     Do you have any of the following new or worsening symptoms? Joint pain     Have you traveled internationally in the last month? No           Travel History   Travel since 03/13/24    No documented travel since 03/13/24       Lakeasha Petion Gandee, LPN

## 2024-04-12 NOTE — Progress Notes (Signed)
 MEDICAL OFF[facility], MEDICAL OFF[facility] BUILD[facility] 3  [address]  [place] [address]  [phone]    Lizvet Chunn 04/12/2024   [id number] 08/26/1960     Chief Complaint: Pre-op (Plan for RATLH, possible BSO/Possible AR/PR/Pt has Echo for 04/13/24. )      Subjective: Angela Frey is a 63 y.o. female presenting for preop visit. Pt has been having PMG. Endometrial sampling benign. Pt failed progesterone therapy. She would like hysterectomy.    Pt had referral to cardiology for clearance. Pt has TEE tomorrow.     Medical History   I have reviewed and updated as appropriate the past medical, family and social history today:    Medical History/Surgical History/Family History  Past Medical History:   Diagnosis Date    Asthma     Centrilobular emphysema     Chronic obstructive airway disease     Chronic respiratory failure with hypoxia (CMS HCC)     Hypertension     Hypothyroidism     OSA (obstructive sleep apnea)     SOB (shortness of breath)     Type 2 diabetes mellitus           Past Surgical History:   Procedure Laterality Date    HX PELVIC LAPAROSCOPY      HX TONSILLECTOMY      HX TUBAL LIGATION      LEG SURGERY      2-3 surgeries    NECK SURGERY      TUBOPLASTY / TUBOTUBAL ANASTOMOSIS      ULNAR NERVE REPAIR Bilateral           Family Medical History:       Problem Relation (Age of Onset)    Arthritis-osteo Mother, Father    Asthma Mother    Blood Clots Father    Congestive Heart Failure Father    Coronary Artery Disease Father    Heart Attack Father    High Cholesterol Father    Hypertension (High Blood Pressure) Mother, Father    Migraines Mother    Sleep disorders Father    Thyroid  Disease Mother             Menstrual History    Total time span for birth control pills      Use of HRT (how long total use)      Use of Infertility Treatments      Age of Menarche      Avg. frequency (days)      Duration (days)      Flow      Pattern      Age of Menopause         OB History   Gravida  Para Term Preterm AB Living   2 2 2   2    SAB IAB Ectopic Multiple Live Births             # Outcome Date GA Lbr Len/2nd Weight Sex Type Anes PTL Lv   2 Term            1 Term              Social History     Socioeconomic History    Marital status: Widowed   Tobacco Use    Smoking status: Former     Current packs/day: 0.00     Average packs/day: 1.3 packs/day for 40.0 years (50.0 ttl pk-yrs)     Types: Cigarettes     Start date:  89     Quit date: 2015     Years since quitting: 10.7    Smokeless tobacco: Never   Vaping Use    Vaping status: Never Used   Substance and Sexual Activity    Alcohol use: Never    Drug use: Never    Sexual activity: Not Currently     Birth control/protection: Post-menopausal     Social Determinants of Health     Financial Resource Strain: Low Risk (11/24/2023)    Financial Resource Strain     SDOH Financial: No   Transportation Needs: Low Risk (11/24/2023)    Transportation Needs     SDOH Transportation: No   Social Connections: Low Risk (11/24/2023)    Social Connections     SDOH Social Isolation: 5 or more times a week   Intimate Partner Violence: Low Risk (11/24/2023)    Intimate Partner Violence     SDOH Domestic Violence: No   Housing Stability: Low Risk (11/24/2023)    Housing Stability     SDOH Housing Situation: I have housing.     SDOH Housing Worry: No     Expanded Substance History       Allergies:  Allergies[1]  Medications:  Current Outpatient Medications   Medication Sig    albuterol  sulfate (PROVENTIL  OR VENTOLIN  OR PROAIR ) 90 mcg/actuation Inhalation oral inhaler Take 1-2 Puffs by inhalation Every 4 hours as needed    albuterol  sulfate (PROVENTIL ) 2.5 mg /3 mL (0.083 %) Inhalation nebulizer solution Take 3 mL (2.5 mg total) by nebulization Every 4 hours as needed for Wheezing    amitriptyline  (ELAVIL ) 25 mg Oral Tablet Take 1 Tablet (25 mg total) by mouth Every night    aspirin 81 mg Oral Tablet, Chewable Chew 1 Tablet (81 mg total) Daily    baclofen  (LIORESAL ) 10 mg Oral  Tablet Take 1 Tablet (10 mg total) by mouth Four times a day Indications: muscle spasms caused by a spinal disease    Blood Sugar Diagnostic (ACCU-CHEK GUIDE TEST STRIPS) Does not apply Strip 1 Strip Four times a day    Blood-Glucose Meter Misc 4 times per day    budesonide -formoteroL  (SYMBICORT ) 160-4.5 mcg/actuation Inhalation oral inhaler Take 2 Puffs by inhalation Twice daily    calcium carbonate-vitamin D3 (CALCIUM 600 WITH VITAMIN D3) 600 mg-12.5 mcg (500 unit) Oral Capsule Take by mouth    cefuroxime (CEFTIN) 500 mg Oral Tablet Take 1 Tablet (500 mg total) by mouth Twice daily    cholecalciferol, Vitamin D3, 125 mcg (5,000 unit) Oral Tablet Take by oral route.    clobetasoL  (TEMOVATE ) 0.05 % Ointment APPLY A PEA SIZED AMOUNT TO GLUTEAL CLEFT EVERY MONDAY AND THURSDAY AS DIRECTED    cyanocobalamin (VITAMIN B 12) 1,000 mcg Oral Tablet Take 1 Tablet (1,000 mcg total) by mouth Daily    esomeprazole  magnesium  (NEXIUM ) 40 mg Oral Capsule, Delayed Release(E.C.) TAKE 1 CAPSULE BY MOUTH IN THE MORNING BEFORE BREAKFAST    [START ON 04/19/2024] estradiol (VAGIFEM) vaginal tablet Insert 1 Tablet (10 mcg total) into the vagina Every MON and THURS    folic acid (FOLVITE) 1 mg Oral Tablet Take 1 Tablet (1 mg total) by mouth Daily    gabapentin  (NEURONTIN ) 600 mg Oral Tablet Take 1 Tablet (600 mg total) by mouth Three times a day    hydroCHLOROthiazide  (HYDRODIURIL ) 25 mg Oral Tablet Take 1 Tablet (25 mg total) by mouth Daily    ketoconazole  (NIZORAL ) 2 % Shampoo USE 1/2 TO 1 (ONE-HALF TO  ONE) OUNCE OF SHAMPOO TOPICALLY THREE TIMES A WEEK    Lactobac no.41/Bifidobact no.7 (PROBIOTIC-10 ORAL) Take by mouth Once a day    Lancets (ACCU-CHEK SOFTCLIX LANCETS) Misc 1 Each Four times a day    levothyroxine  (SYNTHROID) 175 mcg Oral Tablet Take 1 Tablet (175 mcg total) by mouth Every morning    lisinopriL  (PRINIVIL ) 20 mg Oral Tablet TAKE 1 TABLET(20 MG) BY MOUTH DAILY    magnesium  Oxide 420 mg Oral Tablet Take 1 Tablet (420 mg  total) by mouth Daily    meloxicam  (MOBIC ) 15 mg Oral Tablet Take 1 Tablet (15 mg total) by mouth Once per day as needed for Pain    MILK THISTLE ORAL Take by mouth    Norethindrone , Contraceptive, (equiv to: ORTHO MICRONOR ) 0.35 mg Oral tablet Take 1 Tablet (0.35 mg total) by mouth Daily    nystatin  (NYSTOP ) 100,000 unit/gram Powder by Apply Topically route Three times a day as needed    pediatric multivitamins Oral Tablet, Chewable Chew 1 Tablet Daily    pravastatin  (PRAVACHOL ) 10 mg Oral Tablet Take 1 Tablet (10 mg total) by mouth Every evening    sertraline  (ZOLOFT ) 100 mg Oral Tablet Take 1 Tablet (100 mg total) by mouth Daily    tiotropium bromide  (SPIRIVA  HANDIHALER) 18 mcg Inhalation Capsule, w/Inhalation Device Inhale 1 Capsule (18 mcg total) by inhalation via handi-haler Daily    tirzepatide  (MOUNJARO ) 12.5 mg/0.5 mL Subcutaneous Pen Injector Inject 0.5 mL (12.5 mg total) under the skin Every 7 days    tolterodine (DETROL LA) 4 mg Oral Capsule, Sust. Release 24 hr     triamcinolone acetonide 0.1 % Cream Apply topically    turmeric/turmeric ext/pepr ext (TURMERIC-TURMERIC EXT-PEPPER) 500-3 mg Oral Capsule Take by mouth    vitamin E 400 unit Oral Capsule Take 1 Capsule (400 Units total) by mouth Daily    VITAMIN E ORAL Take by mouth     Immunization History:  Immunization History   Administered Date(s) Administered    Covid-19 Vaccine,Moderna,12 Years+ 08/05/2020    Covid-19 Vaccine,Pfizer-BioNTech,Purple Top,70yrs+ 09/22/2019, 10/13/2019    DIPTH,PERTUSSIS-ACEL,TETANUS >10 YRS OLD 06/13/2012    Flu Family 05/28/2013    High-Dose Influenza Vaccine, 65+ 06/01/2011    Influenza Vaccine, 6 month-adult 06/05/2012, 05/21/2014, 05/28/2015, 05/17/2017, 05/14/2019, 06/01/2023    Tetanus Toxoid/Diphtheria Toxoid/Acellular Pertussis Vaccine, Adsorbed 10/02/2023     Problem List:  Patient Active Problem List    Diagnosis Date Noted    Rectocele 04/12/2024    Pelvic pain 04/12/2024    Hepatic cirrhosis, unspecified  hepatic cirrhosis type, unspecified whether ascites present (CMS HCC) 06/01/2023    Diabetic polyneuropathy associated with type 2 diabetes mellitus (CMS HCC) 05/01/2023    Obesity 03/10/2023    Benign neoplasm of ascending colon 12/02/2022    Type 2 diabetes mellitus with circulatory disorder 12/02/2022    Polyp of sigmoid colon 12/02/2022    Stenosis of carotid artery 12/02/2022    Left hip pain 11/15/2022    Varicose veins of both lower extremities 09/03/2022    Depression screening negative 03/02/2022    Osteoarthritis of left shoulder 02/26/2022    Family history of kidney cancer 10/21/2021    PMB (postmenopausal bleeding) 04/01/2021    Full thickness rotator cuff tear 01/09/2021    Abdominal aortic atherosclerosis (CMS HCC) 07/09/2020    Mixed stress and urge urinary incontinence 02/28/2020    Essential hypertension 02/20/2020    Hypothyroidism due to Hashimoto's thyroiditis 02/20/2020    Mild intermittent asthma without complication  02/20/2020    Gastroesophageal reflux disease without esophagitis 02/20/2020    Mixed hyperlipidemia 02/20/2020    Primary insomnia 02/20/2020    OSA (obstructive sleep apnea) 03/07/2018    Carpal tunnel syndrome, bilateral 11/22/2017    Chronic neck pain 01/24/2017    Osteoarthritis of multiple joints 10/12/2016    Family history of vitamin B12 deficiency 10/01/2016    GAD (generalized anxiety disorder) 10/01/2016    Arthropathy 06/16/2016    History of colonic polyps 06/16/2016    Rectal polyp 06/16/2016    Allergic rhinitis, seasonal 02/09/2016    At risk for injury 02/09/2016    Compulsive tobacco user syndrome 02/09/2016    FOM (frequency of micturition) 02/09/2016    RLS (restless legs syndrome) 02/09/2016    Centriacinar emphysema (CMS HCC) 05/28/2015    Chronic fatigue 05/28/2015    Neuropathy (CMS HCC) 05/28/2015    Incomplete emptying of bladder 11/11/2014    Fixation hardware in lower extremity 04/23/2014    History of restless legs syndrome 04/22/2014    Ex-smoker  08/13/2013    Incomplete uterovaginal prolapse 06/08/2011    Accessory skin tags 06/01/2011       Review of Systems:  ROS neg except as mentioned in HPI.    Objective   Vitals:  Vitals:    04/12/24 1258   BP: 110/66   Pulse: 85   Resp: 18   Temp: 36.2 C (97.1 F)   SpO2: 95%   Weight: 79.1 kg (174 lb 6.4 oz)   Height: 1.626 m (5' 4)   BMI: 29.94      Height: 162.6 cm (5' 4)  Wt Readings from Last 5 Encounters:   04/12/24 78.9 kg (174 lb)   04/12/24 79.1 kg (174 lb 6.4 oz)   04/09/24 78.9 kg (174 lb)   04/05/24 78.9 kg (174 lb)   02/28/24 83 kg (183 lb)         Physical Exam:  General Appearance: appears in good health, comfortable, appears stated age, no distress, and vital signs reviewed  Head: NCAT  Eyes: lids and lashes normal, conjunctivae and sclerae normal and pupils equal, EOM intact  Skin: Skin warm and dry and No rashes  Musculoskeletal: inspection is normal  Psychiatric: Normal affect, behavior, memory, thought content, judgement, and speech.  Neurology: normal without focal findings  mental status, speech normal, alert and oriented x iii    Pelvic: Vulva without lesions, pink, NT   Urethra without lesions, NT   Bladder NT  Vaginal pink, atrophic, no lesions, NT, + G3 rectocele with standing valsalva   Cervix smooth, no lesions, no d/c, NT, pink   Uterus normal size, mobile, nontender   Adenexa normal size, NT, no masses  Anus without lesions, good tone  Pelvis without masses  Rectal: Deferred.           Laboratory Studies/Data Reviewed    Nursing Notes:   Gandee, Krista, LPN  89/97/74 8688  Signed   Travel Screening       Question Response    Have you been in contact with someone who was sick? No / Unsure     Do you have any of the following new or worsening symptoms? Joint pain     Have you traveled internationally in the last month? No           Travel History   Travel since 03/13/24    No documented travel since 03/13/24       Krista Gandee, LPN  Results for orders placed or performed in visit  on 04/17/24 (from the past 4 weeks)   VITAMIN B12    Collection Time: 04/17/24  4:46 PM   Result Value Ref Range    VITAMIN B 12  1,725 (H) 200 - 900 pg/mL   CBC/DIFF    Collection Time: 04/17/24  4:46 PM    Narrative    The following orders were created for panel order CBC/DIFF.  Procedure                               Abnormality         Status                     ---------                               -----------         ------                     CBC WITH IPQQ[[id number]]                Abnormal            Final result                 Please view results for these tests on the individual orders.   CBC WITH DIFF    Collection Time: 04/17/24  4:46 PM   Result Value Ref Range    WBC 4.9 3.7 - 11.0 x10^3/uL    RBC 3.90 3.85 - 5.22 x10^6/uL    HGB 10.6 (L) 11.5 - 16.0 g/dL    HCT 67.1 (L) 65.1 - 46.0 %    MCV 84.1 78.0 - 100.0 fL    MCH 27.2 26.0 - 32.0 pg    MCHC 32.3 31.0 - 35.5 g/dL    RDW-CV 85.1 88.4 - 84.4 %    PLATELETS 112 (L) 150 - 400 x10^3/uL    MPV 11.5 8.7 - 12.5 fL    NEUTROPHIL % 55.8 %    LYMPHOCYTE % 27.1 %    MONOCYTE % 7.9 %    EOSINOPHIL % 8.4 %    BASOPHIL % 0.4 %    NEUTROPHIL # 2.74 1.50 - 7.70 x10^3/uL    LYMPHOCYTE # 1.33 1.00 - 4.80 x10^3/uL    MONOCYTE # 0.39 0.20 - 1.10 x10^3/uL    EOSINOPHIL # 0.41 <=0.50 x10^3/uL    BASOPHIL # <0.10 <=0.20 x10^3/uL    IMMATURE GRANULOCYTE % 0.4 0.0 - 1.0 %    IMMATURE GRANULOCYTE # <0.10 <0.10 x10^3/uL   Results for orders placed or performed in visit on 03/25/24 (from the past 4 weeks)   RETICULOCYTE COUNT    Collection Time: 03/25/24  9:29 PM   Result Value Ref Range    RETICULOCYTE % AUTOMATED 1.53 0.50 - 2.20 %    RETICULOCYTES COUNT # AUTOMATED 57.5 17.0 - 100.0 x10^3/uL    IMMATURE RETIC FRACTION 8.0 2.7 - 15.9 %    RETICULOCYTE HEMOGLOBIN EQUIVALENT 29.6 28.0 - 38.0 pg    Narrative    Both reticulocyte hemoglobin content(RET-He) and immature reticulocyte fraction (IRF) quantify circulating immature red blood cells (RBC). They are used to evaluate  anemic patients who may have insufficient marrow production (lowerRET-He & IRF) or rapid peripheral destruction of RBC (higher RET-He &  IRF).          No results found for this or any previous visit (from the past 720 hours).     Assessment/Plan   Diagnosis    ICD-10-CM    1. PMB (postmenopausal bleeding)  N95.0 CASE REQUEST SURGICAL: ROBOTIC HYSTERECTOMY, SALPINGECTOMY BILATERAL, REPAIR POSTERIOR      2. Preop cardiovascular exam  Z01.810 XR CHEST AP     PT/INR     PTT (PARTIAL THROMBOPLASTIN TIME)     GLUCOSE FASTING      3. Rectocele  N81.6 CASE REQUEST SURGICAL: ROBOTIC HYSTERECTOMY, SALPINGECTOMY BILATERAL, REPAIR POSTERIOR      4. Pelvic pressure in female  R10.20 CASE REQUEST SURGICAL: ROBOTIC HYSTERECTOMY, SALPINGECTOMY BILATERAL, REPAIR POSTERIOR      5. Pelvic pain  R10.20       6. Type 2 diabetes mellitus with other diabetic kidney complication  E11.29 PT/INR      7. 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 Type 2 diabetes mellitus without complication, with long-term current use of insulin  E11.9 GLUCOSE FASTING    Z79.4           Plan  RATLH, bilateral salpingectomy, posterior repair    Return for preop.      Brentlee Delage E Luisenrique Conran, DO         [1]   Allergies  Allergen Reactions    Pseudoephedrine Hcl Hives/ Urticaria    Doxylamin-Pse-Dm-Acetaminophen   Other Adverse Reaction (Add comment), Hives/ Urticaria and Rash    Clindamycin Phosphate     Guaifenesin   Other Adverse Reaction (Add comment)    Cleocin [Clindamycin] Hives/ Urticaria    Tessalon [Benzonatate] Hives/ Urticaria

## 2024-04-13 ENCOUNTER — Ambulatory Visit (HOSPITAL_COMMUNITY)

## 2024-04-16 ENCOUNTER — Other Ambulatory Visit: Payer: Self-pay

## 2024-04-17 ENCOUNTER — Ambulatory Visit (HOSPITAL_COMMUNITY)
Admission: RE | Admit: 2024-04-17 | Discharge: 2024-04-17 | Disposition: A | Source: Ambulatory Visit | Attending: INTERNAL MEDICINE | Admitting: INTERNAL MEDICINE

## 2024-04-17 ENCOUNTER — Other Ambulatory Visit: Payer: Self-pay

## 2024-04-17 ENCOUNTER — Ambulatory Visit
Admission: RE | Admit: 2024-04-17 | Discharge: 2024-04-17 | Disposition: A | Discharge status: Home / Self Care | Source: Ambulatory Visit | Attending: FAMILY MEDICINE | Admitting: FAMILY MEDICINE

## 2024-04-17 ENCOUNTER — Ambulatory Visit (HOSPITAL_COMMUNITY)

## 2024-04-17 DIAGNOSIS — R0789 Other chest pain: Secondary | ICD-10-CM | POA: Insufficient documentation

## 2024-04-17 DIAGNOSIS — W19XXXS Unspecified fall, sequela: Secondary | ICD-10-CM

## 2024-04-17 DIAGNOSIS — R5383 Other fatigue: Secondary | ICD-10-CM

## 2024-04-17 DIAGNOSIS — D649 Anemia, unspecified: Secondary | ICD-10-CM

## 2024-04-17 DIAGNOSIS — M79644 Pain in right finger(s): Secondary | ICD-10-CM | POA: Insufficient documentation

## 2024-04-17 LAB — CBC WITH DIFF
BASOPHIL #: <0.1 x10ˆ3/uL (ref <=0.20)
BASOPHIL %: 0.4 %
EOSINOPHIL #: 0.41 x10ˆ3/uL (ref <=0.50)
EOSINOPHIL %: 8.4 %
HCT: 32.8 % — ABNORMAL LOW (ref 34.8–46.0)
HGB: 10.6 g/dL — ABNORMAL LOW (ref 11.5–16.0)
IMMATURE GRANULOCYTE #: <0.1 x10ˆ3/uL (ref <0.10)
IMMATURE GRANULOCYTE %: 0.4 % (ref 0.0–1.0)
LYMPHOCYTE #: 1.33 x10ˆ3/uL (ref 1.00–4.80)
LYMPHOCYTE %: 27.1 %
MCH: 27.2 pg (ref 26.0–32.0)
MCHC: 32.3 g/dL (ref 31.0–35.5)
MCV: 84.1 fL (ref 78.0–100.0)
MONOCYTE #: 0.39 x10ˆ3/uL (ref 0.20–1.10)
MONOCYTE %: 7.9 %
MPV: 11.5 fL (ref 8.7–12.5)
NEUTROPHIL #: 2.74 x10ˆ3/uL (ref 1.50–7.70)
NEUTROPHIL %: 55.8 %
PLATELETS: 112 x10ˆ3/uL — ABNORMAL LOW (ref 150–400)
RBC: 3.9 x10ˆ6/uL (ref 3.85–5.22)
RDW-CV: 14.8 % (ref 11.5–15.5)
WBC: 4.9 x10ˆ3/uL (ref 3.7–11.0)

## 2024-04-17 LAB — VITAMIN B12: VITAMIN B 12: 1725 pg/mL — ABNORMAL HIGH (ref 200–900)

## 2024-04-18 ENCOUNTER — Ambulatory Visit (INDEPENDENT_AMBULATORY_CARE_PROVIDER_SITE_OTHER): Payer: Self-pay | Admitting: FAMILY MEDICINE

## 2024-04-18 ENCOUNTER — Other Ambulatory Visit (HOSPITAL_BASED_OUTPATIENT_CLINIC_OR_DEPARTMENT_OTHER): Payer: Self-pay | Admitting: Obstetrics & Gynecology

## 2024-04-18 LAB — IRON: IRON: 93 ug/dL (ref 45–170)

## 2024-04-18 MED ORDER — ESTRADIOL 10 MCG VAGINAL TABLET
10.0000 ug | ORAL_TABLET | VAGINAL | 11 refills | Status: DC
Start: 2024-04-19 — End: 2024-05-25

## 2024-04-26 ENCOUNTER — Ambulatory Visit (HOSPITAL_COMMUNITY): Payer: Self-pay

## 2024-04-27 ENCOUNTER — Ambulatory Visit (HOSPITAL_COMMUNITY)

## 2024-05-01 ENCOUNTER — Telehealth (INDEPENDENT_AMBULATORY_CARE_PROVIDER_SITE_OTHER): Payer: Self-pay | Admitting: PULMONARY DISEASE

## 2024-05-01 DIAGNOSIS — J9611 Chronic respiratory failure with hypoxia: Secondary | ICD-10-CM

## 2024-05-01 DIAGNOSIS — J432 Centrilobular emphysema: Secondary | ICD-10-CM

## 2024-05-01 MED ORDER — PREDNISONE 20 MG TABLET
ORAL_TABLET | ORAL | 0 refills | Status: DC
Start: 2024-05-01 — End: 2024-05-17

## 2024-05-01 NOTE — Telephone Encounter (Signed)
 Patient called in asking to be seen by the office for what she believes may be pluerisy. Patient says she is not SOB nor wheezing but is having pain on inhalation, it feels like a Tiny sledgehammer or Thousands of little knives    Spoke with Dr.Keith and he would like to treat with prednisone .

## 2024-05-02 ENCOUNTER — Other Ambulatory Visit (HOSPITAL_BASED_OUTPATIENT_CLINIC_OR_DEPARTMENT_OTHER): Payer: Self-pay | Admitting: Obstetrics & Gynecology

## 2024-05-03 ENCOUNTER — Telehealth (INDEPENDENT_AMBULATORY_CARE_PROVIDER_SITE_OTHER): Payer: Self-pay | Admitting: FAMILY MEDICINE

## 2024-05-03 ENCOUNTER — Other Ambulatory Visit (INDEPENDENT_AMBULATORY_CARE_PROVIDER_SITE_OTHER): Payer: Self-pay | Admitting: FAMILY MEDICINE

## 2024-05-03 DIAGNOSIS — I10 Essential (primary) hypertension: Secondary | ICD-10-CM

## 2024-05-03 MED ORDER — LISINOPRIL 20 MG TABLET
20.0000 mg | ORAL_TABLET | Freq: Every day | ORAL | 0 refills | Status: AC
Start: 2024-05-03 — End: ?

## 2024-05-03 NOTE — Telephone Encounter (Signed)
 Pt called in and needs a refill on her Lisinopril  and wants to know if this can cause her to have Pleurisy? Or if any of her meds that she is on can cause this. She stated that if you don't have time to do this, let her know and she will see if her Pulmonologist will do it.      Johnye Lovings, CMA

## 2024-05-04 ENCOUNTER — Ambulatory Visit

## 2024-05-04 ENCOUNTER — Other Ambulatory Visit: Payer: Self-pay

## 2024-05-04 DIAGNOSIS — N3281 Overactive bladder: Secondary | ICD-10-CM | POA: Insufficient documentation

## 2024-05-04 LAB — URINALYSIS, MACROSCOPIC
BILIRUBIN: NEGATIVE mg/dL
BLOOD: NEGATIVE mg/dL
GLUCOSE: NEGATIVE mg/dL
KETONES: NEGATIVE mg/dL
LEUKOCYTES: NEGATIVE WBCs/uL
NITRITE: NEGATIVE
PH: 7 (ref 4.6–8.0)
PROTEIN: NEGATIVE mg/dL
SPECIFIC GRAVITY: <=1.005 (ref 1.003–1.035)
UROBILINOGEN: 0.2 mg/dL (ref 0.2–1.0)

## 2024-05-04 LAB — URINALYSIS, MICROSCOPIC

## 2024-05-06 ENCOUNTER — Other Ambulatory Visit: Payer: Self-pay

## 2024-05-07 ENCOUNTER — Other Ambulatory Visit: Payer: Self-pay

## 2024-05-07 ENCOUNTER — Ambulatory Visit
Admission: RE | Admit: 2024-05-07 | Discharge: 2024-05-07 | Disposition: A | Discharge status: Home / Self Care | Source: Ambulatory Visit | Attending: INTERVENTIONAL CARDIOLOGY | Admitting: INTERVENTIONAL CARDIOLOGY

## 2024-05-07 DIAGNOSIS — Z01810 Encounter for preprocedural cardiovascular examination: Secondary | ICD-10-CM | POA: Insufficient documentation

## 2024-05-07 LAB — TRANSTHORACIC ECHOCARDIOGRAM - ADULT: EF VISUAL ESTIMATE: 60

## 2024-05-11 ENCOUNTER — Other Ambulatory Visit (INDEPENDENT_AMBULATORY_CARE_PROVIDER_SITE_OTHER): Payer: Self-pay | Admitting: FAMILY MEDICINE

## 2024-05-11 ENCOUNTER — Other Ambulatory Visit (INDEPENDENT_AMBULATORY_CARE_PROVIDER_SITE_OTHER): Payer: Self-pay | Admitting: NURSE PRACTITIONER

## 2024-05-11 DIAGNOSIS — I10 Essential (primary) hypertension: Secondary | ICD-10-CM

## 2024-05-11 DIAGNOSIS — E063 Autoimmune thyroiditis: Secondary | ICD-10-CM

## 2024-05-15 ENCOUNTER — Telehealth (INDEPENDENT_AMBULATORY_CARE_PROVIDER_SITE_OTHER): Payer: Self-pay | Admitting: FAMILY MEDICINE

## 2024-05-15 ENCOUNTER — Other Ambulatory Visit (INDEPENDENT_AMBULATORY_CARE_PROVIDER_SITE_OTHER): Payer: Self-pay | Admitting: NURSE PRACTITIONER

## 2024-05-15 DIAGNOSIS — L219 Seborrheic dermatitis, unspecified: Secondary | ICD-10-CM

## 2024-05-15 NOTE — Telephone Encounter (Signed)
 Pt states that she is due for her Mammogram on Nov 7th and wants to know if you can put in an order for it?    Johnye Lovings, CMA

## 2024-05-16 ENCOUNTER — Telehealth (INDEPENDENT_AMBULATORY_CARE_PROVIDER_SITE_OTHER): Payer: Self-pay | Admitting: PULMONARY DISEASE

## 2024-05-16 ENCOUNTER — Other Ambulatory Visit (INDEPENDENT_AMBULATORY_CARE_PROVIDER_SITE_OTHER): Payer: Self-pay | Admitting: FAMILY MEDICINE

## 2024-05-16 ENCOUNTER — Other Ambulatory Visit: Payer: Self-pay

## 2024-05-16 DIAGNOSIS — J432 Centrilobular emphysema: Secondary | ICD-10-CM

## 2024-05-16 DIAGNOSIS — Z1231 Encounter for screening mammogram for malignant neoplasm of breast: Secondary | ICD-10-CM

## 2024-05-16 NOTE — Telephone Encounter (Addendum)
 Spoke with Scott-PA about the worsening pain and now trouble breathing, The patient stated that the prednisone  did not work. (10/21 encounter) He also advised the ER.    Patient was not pleased with that, I did discuss with her the capabilities of the clinic versus an ER.    She stated that she would think about it. I again advised that we wanted her to get evaluated.    She verbalized understanding and thanked us  for our time.

## 2024-05-16 NOTE — Telephone Encounter (Signed)
 Pt called back to speak with Stacie. I asked if it was something I could help with or any of the nurses. She states she is still having the same symptoms she was having a few weeks ago when we prescribed her prednisone . Spoke with Anthony,RN and he suggested the ER. I informed pt and pt did not want to go to the ER. She wants to see Dr. Desiderio. I explained we will have to see what availability he has, but we strongly encourage the ER. Transferred pt to nursing line to assist.

## 2024-05-16 NOTE — Telephone Encounter (Signed)
 The patient called in and left a message asking that someone in our office give her a call back. I called the patient no answer. LM for a call back.

## 2024-05-17 ENCOUNTER — Encounter (HOSPITAL_BASED_OUTPATIENT_CLINIC_OR_DEPARTMENT_OTHER): Payer: Self-pay | Admitting: Obstetrics & Gynecology

## 2024-05-17 ENCOUNTER — Ambulatory Visit: Payer: Self-pay | Attending: Obstetrics & Gynecology | Admitting: Obstetrics & Gynecology

## 2024-05-17 VITALS — HR 95 | Temp 98.4°F | Resp 17 | Ht 64.0 in | Wt 186.2 lb

## 2024-05-17 DIAGNOSIS — Z01810 Encounter for preprocedural cardiovascular examination: Secondary | ICD-10-CM | POA: Insufficient documentation

## 2024-05-17 DIAGNOSIS — N816 Rectocele: Secondary | ICD-10-CM | POA: Insufficient documentation

## 2024-05-17 DIAGNOSIS — N950 Postmenopausal bleeding: Secondary | ICD-10-CM | POA: Insufficient documentation

## 2024-05-17 DIAGNOSIS — R102 Pelvic and perineal pain unspecified side: Secondary | ICD-10-CM | POA: Insufficient documentation

## 2024-05-17 NOTE — Nursing Note (Signed)
 Travel Screening       Question Response    Have you been in contact with someone who was sick? No / Unsure     Do you have any of the following new or worsening symptoms? None of these     Have you traveled internationally in the last month? No           Travel History   Travel since 04/16/24    No documented travel since 04/16/24       Shariya Gaster Gandee, LPN

## 2024-05-17 NOTE — Progress Notes (Signed)
 MEDICAL OFFICE BUILDING 3 [facility], MEDICAL OFFICE BUILDING 3  [address]  [place] [address]  [id number]    Angela Frey 05/17/2024   [id number] December 19, 1960     Chief Complaint: Pre-op (Pt scheduled for surgery 11/13. She wants to know when she should stop taking her vitamin E. Pt reports last mounjaro  shot on 05/13/24.)      Subjective: Angela Frey is a 63 y.o. female presenting for preop evaluation.  Pt is having persistent PMB, rectocele, pelvic pain and failed progesterone therapy. She would like hysterectomy, bso and posterior repair.    Cardiac clearance done.     Medical History   I have reviewed and updated as appropriate the past medical, family and social history today:    Medical History/Surgical History/Family History  Past Medical History:   Diagnosis Date    Asthma     Centrilobular emphysema     Chronic obstructive airway disease     Chronic respiratory failure with hypoxia (CMS HCC)     Hypertension     Hypothyroidism     OSA (obstructive sleep apnea)     SOB (shortness of breath)     Type 2 diabetes mellitus           Past Surgical History:   Procedure Laterality Date    HX PELVIC LAPAROSCOPY      HX TONSILLECTOMY      HX TUBAL LIGATION      LEG SURGERY      2-3 surgeries    NECK SURGERY      TUBOPLASTY / TUBOTUBAL ANASTOMOSIS      ULNAR NERVE REPAIR Bilateral           Family Medical History:       Problem Relation (Age of Onset)    Arthritis-osteo Mother, Father    Asthma Mother    Blood Clots Father    Congestive Heart Failure Father    Coronary Artery Disease Father    Heart Attack Father    High Cholesterol Father    Hypertension (High Blood Pressure) Mother, Father    Migraines Mother    Sleep disorders Father    Thyroid  Disease Mother             Menstrual History    Total time span for birth control pills      Use of HRT (how long total use)      Use of Infertility Treatments      Age of Menarche      Avg. frequency (days)      Duration (days)      Flow       Pattern      Age of Menopause         OB History   Gravida Para Term Preterm AB Living   2 2 2   2    SAB IAB Ectopic Multiple Live Births             # Outcome Date GA Lbr Len/2nd Weight Sex Type Anes PTL Lv   2 Term            1 Term              Social History     Socioeconomic History    Marital status: Widowed   Tobacco Use    Smoking status: Former     Current packs/day: 0.00     Average packs/day: 1.3 packs/day for 40.0 years (50.0 ttl pk-yrs)  Types: Cigarettes     Start date: 61     Quit date: 2015     Years since quitting: 10.8    Smokeless tobacco: Never   Vaping Use    Vaping status: Never Used   Substance and Sexual Activity    Alcohol use: Never    Drug use: Never    Sexual activity: Not Currently     Birth control/protection: Post-menopausal     Social Determinants of Health     Financial Resource Strain: Low Risk (11/24/2023)    Financial Resource Strain     SDOH Financial: No   Transportation Needs: Low Risk (11/24/2023)    Transportation Needs     SDOH Transportation: No   Social Connections: Low Risk (11/24/2023)    Social Connections     SDOH Social Isolation: 5 or more times a week   Intimate Partner Violence: Low Risk (11/24/2023)    Intimate Partner Violence     SDOH Domestic Violence: No   Housing Stability: Low Risk (11/24/2023)    Housing Stability     SDOH Housing Situation: I have housing.     SDOH Housing Worry: No     Expanded Substance History       Allergies:  Allergies[1]  Medications:  Current Outpatient Medications   Medication Sig    albuterol  sulfate (PROVENTIL  OR VENTOLIN  OR PROAIR ) 90 mcg/actuation Inhalation oral inhaler Take 1-2 Puffs by inhalation Every 4 hours as needed    amitriptyline  (ELAVIL ) 25 mg Oral Tablet Take 1 Tablet (25 mg total) by mouth Every night    aspirin 81 mg Oral Tablet, Chewable Chew 1 Tablet (81 mg total) Daily    baclofen  (LIORESAL ) 10 mg Oral Tablet Take 1 Tablet (10 mg total) by mouth Four times a day Indications: muscle spasms caused by a spinal  disease    Blood Sugar Diagnostic (ACCU-CHEK GUIDE TEST STRIPS) Does not apply Strip 1 Strip Four times a day    Blood-Glucose Meter Misc 4 times per day    budesonide -formoteroL  (SYMBICORT ) 160-4.5 mcg/actuation Inhalation oral inhaler Take 2 Puffs by inhalation Twice daily    calcium carbonate-vitamin D3 (CALCIUM 600 WITH VITAMIN D3) 600 mg-12.5 mcg (500 unit) Oral Capsule Take by mouth    cholecalciferol, Vitamin D3, 125 mcg (5,000 unit) Oral Tablet Take by oral route.    clobetasoL  (TEMOVATE ) 0.05 % Ointment APPLY A PEA SIZED AMOUNT TO GLUTEAL CLEFT EVERY MONDAY AND THURSDAY AS DIRECTED    cyanocobalamin (VITAMIN B 12) 1,000 mcg Oral Tablet Take 1 Tablet (1,000 mcg total) by mouth Daily    esomeprazole  magnesium  (NEXIUM ) 40 mg Oral Capsule, Delayed Release(E.C.) TAKE 1 CAPSULE BY MOUTH IN THE MORNING BEFORE BREAKFAST    estradiol (VAGIFEM) vaginal tablet Insert 1 Tablet (10 mcg total) into the vagina Every MON and THURS    folic acid (FOLVITE) 1 mg Oral Tablet Take 1 Tablet (1 mg total) by mouth Daily    gabapentin  (NEURONTIN ) 600 mg Oral Tablet Take 1 Tablet (600 mg total) by mouth Three times a day    hydroCHLOROthiazide  (HYDRODIURIL ) 25 mg Oral Tablet TAKE 1 TABLET(25 MG) BY MOUTH DAILY    ketoconazole  (NIZORAL ) 2 % Shampoo APPLY OUNCE TOPICALLY 3 TIMES A WEEK    Lactobac no.41/Bifidobact no.7 (PROBIOTIC-10 ORAL) Take by mouth Once a day    Lancets (ACCU-CHEK SOFTCLIX LANCETS) Misc 1 Each Four times a day    levothyroxine  (SYNTHROID) 175 mcg Oral Tablet TAKE 1 TABLET(175 MCG) BY MOUTH EVERY MORNING  lisinopriL  (PRINIVIL ) 20 mg Oral Tablet Take 1 Tablet (20 mg total) by mouth Daily    magnesium  Oxide 420 mg Oral Tablet Take 1 Tablet (420 mg total) by mouth Daily    meloxicam  (MOBIC ) 15 mg Oral Tablet Take 1 Tablet (15 mg total) by mouth Once per day as needed for Pain    MILK THISTLE ORAL Take by mouth    nystatin  (NYSTOP ) 100,000 unit/gram Powder by Apply Topically route Three times a day as needed     pediatric multivitamins Oral Tablet, Chewable Chew 1 Tablet Daily    pravastatin  (PRAVACHOL ) 10 mg Oral Tablet Take 1 Tablet (10 mg total) by mouth Every evening    sertraline  (ZOLOFT ) 100 mg Oral Tablet Take 1 Tablet (100 mg total) by mouth Daily    tiotropium bromide  (SPIRIVA  HANDIHALER) 18 mcg Inhalation Capsule, w/Inhalation Device Inhale 1 Capsule (18 mcg total) by inhalation via handi-haler Daily    tirzepatide  (MOUNJARO ) 12.5 mg/0.5 mL Subcutaneous Pen Injector Inject 0.5 mL (12.5 mg total) under the skin Every 7 days    tolterodine (DETROL LA) 4 mg Oral Capsule, Sust. Release 24 hr     triamcinolone acetonide 0.1 % Cream Apply topically    turmeric/turmeric ext/pepr ext (TURMERIC-TURMERIC EXT-PEPPER) 500-3 mg Oral Capsule Take by mouth    vitamin E 400 unit Oral Capsule Take 1 Capsule (400 Units total) by mouth Daily    VITAMIN E ORAL Take by mouth     Immunization History:  Immunization History   Administered Date(s) Administered    Covid-19 Vaccine,Moderna,12 Years+ 08/05/2020    Covid-19 Vaccine,Pfizer-BioNTech,Purple Top,2yrs+ 09/22/2019, 10/13/2019    DIPTH,PERTUSSIS-ACEL,TETANUS >10 YRS OLD 06/13/2012    Flu Family 05/28/2013    High-Dose Influenza Vaccine, 65+ (FLUZONE HD) 06/01/2011    Influenza Vaccine, 6 month-adult 06/05/2012, 05/21/2014, 05/28/2015, 05/17/2017, 05/14/2019, 06/01/2023    Tetanus Toxoid/Diphtheria Toxoid/Acellular Pertussis Vaccine, Adsorbed 10/02/2023     Problem List:  Patient Active Problem List    Diagnosis Date Noted    Rectocele 04/12/2024    Pelvic pain 04/12/2024    Hepatic cirrhosis, unspecified hepatic cirrhosis type, unspecified whether ascites present (CMS HCC) 06/01/2023    Diabetic polyneuropathy associated with type 2 diabetes mellitus (CMS HCC) 05/01/2023    Obesity 03/10/2023    Benign neoplasm of ascending colon 12/02/2022    Type 2 diabetes mellitus with circulatory disorder 12/02/2022    Polyp of sigmoid colon 12/02/2022    Stenosis of carotid artery 12/02/2022     Left hip pain 11/15/2022    Varicose veins of both lower extremities 09/03/2022    Depression screening negative 03/02/2022    Osteoarthritis of left shoulder 02/26/2022    Family history of kidney cancer 10/21/2021    PMB (postmenopausal bleeding) 04/01/2021    Full thickness rotator cuff tear 01/09/2021    Abdominal aortic atherosclerosis (CMS HCC) 07/09/2020    Mixed stress and urge urinary incontinence 02/28/2020    Essential hypertension 02/20/2020    Hypothyroidism due to Hashimoto's thyroiditis 02/20/2020    Mild intermittent asthma without complication 02/20/2020    Gastroesophageal reflux disease without esophagitis 02/20/2020    Mixed hyperlipidemia 02/20/2020    Primary insomnia 02/20/2020    OSA (obstructive sleep apnea) 03/07/2018    Carpal tunnel syndrome, bilateral 11/22/2017    Chronic neck pain 01/24/2017    Osteoarthritis of multiple joints 10/12/2016    Family history of vitamin B12 deficiency 10/01/2016    GAD (generalized anxiety disorder) 10/01/2016    Arthropathy 06/16/2016  History of colonic polyps 06/16/2016    Rectal polyp 06/16/2016    Allergic rhinitis, seasonal 02/09/2016    At risk for injury 02/09/2016    Compulsive tobacco user syndrome 02/09/2016    FOM (frequency of micturition) 02/09/2016    RLS (restless legs syndrome) 02/09/2016    Centriacinar emphysema (CMS HCC) 05/28/2015    Chronic fatigue 05/28/2015    Neuropathy (CMS HCC) 05/28/2015    Incomplete emptying of bladder 11/11/2014    Fixation hardware in lower extremity 04/23/2014    History of restless legs syndrome 04/22/2014    Ex-smoker 08/13/2013    Incomplete uterovaginal prolapse 06/08/2011    Accessory skin tags 06/01/2011       Review of Systems:  ROS neg except as mentioned in HPI.    Objective   Vitals:  Vitals:    05/17/24 1316   Pulse: 95   Resp: 17   Temp: 36.9 C (98.4 F)   SpO2: 96%   Weight: 84.5 kg (186 lb 3.2 oz)   Height: 1.626 m (5' 4)   BMI: 31.96      Height: 162.6 cm (5' 4)  Wt Readings from  Last 5 Encounters:   05/17/24 84.5 kg (186 lb 3.2 oz)   04/12/24 78.9 kg (174 lb)   04/12/24 79.1 kg (174 lb 6.4 oz)   04/09/24 78.9 kg (174 lb)   04/05/24 78.9 kg (174 lb)         Physical Exam:  General Appearance: appears in good health, comfortable, mildly obese, appears stated age, no distress, and vital signs reviewed  Head: NCAT  Skin: Skin warm and dry and No rashes  Psychiatric: Normal affect, behavior, memory, thought content, judgement, and speech.  Neurology: normal without focal findings  mental status, speech normal, alert and oriented x iii             Laboratory Studies/Data Reviewed    Nursing Notes:   Gandee, Krista, LPN  88/93/74 8675  Signed   Travel Screening       Question Response    Have you been in contact with someone who was sick? No / Unsure     Do you have any of the following new or worsening symptoms? None of these     Have you traveled internationally in the last month? No           Travel History   Travel since 04/16/24    No documented travel since 04/16/24       Krista Gandee, LPN         Results for orders placed or performed in visit on 05/04/24 (from the past 4 weeks)   URINALYSIS, MACROSCOPIC AND MICROSCOPIC W/CULTURE REFLEX    Collection Time: 05/04/24  9:09 PM    Specimen: Urine, Clean Catch    Narrative    The following orders were created for panel order URINALYSIS, MACROSCOPIC AND MICROSCOPIC W/CULTURE REFLEX.  Procedure                               Abnormality         Status                     ---------                               -----------         ------  URINALYSIS, MACROSCOPIC[[id number]]      Normal              Final result               URINALYSIS, MICROSCOPIC[[id number]]      Normal              Final result                 Please view results for these tests on the individual orders.   URINALYSIS, MACROSCOPIC    Collection Time: 05/04/24  9:09 PM   Result Value Ref Range    SPECIFIC GRAVITY <=1.005 1.003 - 1.035    GLUCOSE Negative Negative  mg/dL    PROTEIN Negative Negative mg/dL    BILIRUBIN Negative Negative mg/dL    UROBILINOGEN 0.2 0.2 - 1.0 mg/dL    PH 7.0 4.6 - 8.0    BLOOD Negative Negative mg/dL    KETONES Negative Negative mg/dL    NITRITE Negative Negative    LEUKOCYTES Negative Negative WBCs/uL    APPEARANCE Clear Clear    COLOR Yellow Light Yellow, Straw, Yellow   URINALYSIS, MICROSCOPIC    Collection Time: 05/04/24  9:09 PM   Result Value Ref Range    RBCS 0-2 None, Occasional, 0-2, Rare /hpf    WBCS 0-2 None, Occasional, 0-2, 3-5 /hpf    BACTERIA Trace Trace, None /hpf    SQUAMOUS EPITHELIAL CELLS 3-5 None, Occasional, 0-2, 3-5, 11-15, Rare /lpf        No results found for this or any previous visit (from the past 720 hours).     Assessment/Plan   Diagnosis    ICD-10-CM    1. PMB (postmenopausal bleeding)  N95.0       2. Preop cardiovascular exam  Z01.810 CBC     COMPREHENSIVE METABOLIC PNL, FASTING     TYPE AND SCREEN      3. Rectocele  N81.6       4. Pelvic pressure in female  R10.20           Plan  RATLH/BSO/Posterior Colporrhaphy  Pt will stop tumeric, vit E, ASA, mobic  and all supplements/vitamins today  Last dose of GLP1 was Monday. She will not take the next dose Monday before surgery  AM of surgery she will take her lisinipril, Spiriva  and thyroid  medication only  Instructed on fasting, bowel prep and shower prep before surgery    Repeat cbc, cmp and new type and screen day before surgery    Return for 1 week post op.      Dynastie Knoop E Sherlonda Flater, DO         [1]   Allergies  Allergen Reactions    Pseudoephedrine Hcl Hives/ Urticaria    Doxylamin-Pse-Dm-Acetaminophen   Other Adverse Reaction (Add comment), Hives/ Urticaria and Rash    Clindamycin Phosphate     Guaifenesin   Other Adverse Reaction (Add comment)    Cleocin [Clindamycin] Hives/ Urticaria    Tessalon [Benzonatate] Hives/ Urticaria

## 2024-05-18 ENCOUNTER — Ambulatory Visit
Admission: RE | Admit: 2024-05-18 | Discharge: 2024-05-18 | Disposition: A | Discharge status: Home / Self Care | Source: Ambulatory Visit | Attending: FAMILY MEDICINE | Admitting: FAMILY MEDICINE

## 2024-05-18 ENCOUNTER — Other Ambulatory Visit: Payer: Self-pay

## 2024-05-18 ENCOUNTER — Ambulatory Visit (INDEPENDENT_AMBULATORY_CARE_PROVIDER_SITE_OTHER): Payer: Self-pay | Admitting: FAMILY MEDICINE

## 2024-05-18 DIAGNOSIS — Z1231 Encounter for screening mammogram for malignant neoplasm of breast: Secondary | ICD-10-CM | POA: Insufficient documentation

## 2024-05-21 NOTE — Addendum Note (Signed)
 Addended by: MARCINE FADEN on: 05/21/2024 02:49 PM     Modules accepted: Orders

## 2024-05-21 NOTE — Telephone Encounter (Signed)
 Patient called asking for a CXR, she did not go the ER on the 5th.

## 2024-05-22 ENCOUNTER — Other Ambulatory Visit: Payer: Self-pay

## 2024-05-22 ENCOUNTER — Encounter (HOSPITAL_COMMUNITY): Payer: Self-pay | Admitting: Obstetrics & Gynecology

## 2024-05-22 ENCOUNTER — Ambulatory Visit
Admission: RE | Admit: 2024-05-22 | Discharge: 2024-05-22 | Disposition: A | Discharge status: Home / Self Care | Source: Ambulatory Visit | Attending: PULMONARY DISEASE | Admitting: PULMONARY DISEASE

## 2024-05-22 DIAGNOSIS — J432 Centrilobular emphysema: Secondary | ICD-10-CM | POA: Insufficient documentation

## 2024-05-22 NOTE — Telephone Encounter (Signed)
 CXR came back clear. Patient still complaining of pain when breathing in. She says it is better.     Patient would like a sooner appt to be seen.     Patient did again refuse to go to ER for this pain.    Per Scott-PA ok to be seen in a week or two.

## 2024-05-23 ENCOUNTER — Ambulatory Visit: Attending: Obstetrics & Gynecology

## 2024-05-23 DIAGNOSIS — E1029 Type 1 diabetes mellitus with other diabetic kidney complication: Secondary | ICD-10-CM | POA: Insufficient documentation

## 2024-05-23 DIAGNOSIS — Z01810 Encounter for preprocedural cardiovascular examination: Secondary | ICD-10-CM | POA: Insufficient documentation

## 2024-05-23 DIAGNOSIS — E1129 Type 2 diabetes mellitus with other diabetic kidney complication: Secondary | ICD-10-CM | POA: Insufficient documentation

## 2024-05-23 LAB — COMPREHENSIVE METABOLIC PNL, FASTING
ALBUMIN: 3.6 g/dL (ref 3.4–4.8)
ALKALINE PHOSPHATASE: 103 U/L (ref 50–130)
ALT (SGPT): 15 U/L (ref <31)
ANION GAP: 9 mmol/L (ref 4–13)
AST (SGOT): 26 U/L (ref 11–34)
BILIRUBIN TOTAL: 0.3 mg/dL (ref 0.3–1.3)
BUN/CREA RATIO: 29 — ABNORMAL HIGH (ref 6–22)
BUN: 25 mg/dL (ref 8–25)
CALCIUM: 9.2 mg/dL (ref 8.6–10.3)
CHLORIDE: 108 mmol/L (ref 96–111)
CO2 TOTAL: 24 mmol/L (ref 23–31)
CREATININE: 0.87 mg/dL (ref 0.60–1.05)
GLUCOSE: 174 mg/dL — ABNORMAL HIGH (ref 70–99)
POTASSIUM: 4.3 mmol/L (ref 3.5–5.1)
PROTEIN TOTAL: 6.8 g/dL (ref 6.0–8.0)
SODIUM: 141 mmol/L (ref 136–145)
eGFRcr - FEMALE: 75 mL/min/1.73mˆ2 (ref >=60)

## 2024-05-23 LAB — TYPE AND SCREEN
ABO/RH(D): A NEG
ANTIBODY SCREEN: NEGATIVE

## 2024-05-23 LAB — CBC
HCT: 31.8 % — ABNORMAL LOW (ref 34.8–46.0)
HGB: 10.1 g/dL — ABNORMAL LOW (ref 11.5–16.0)
MCH: 27.2 pg (ref 26.0–32.0)
MCHC: 31.8 g/dL (ref 31.0–35.5)
MCV: 85.5 fL (ref 78.0–100.0)
MPV: 11.1 fL (ref 8.7–12.5)
PLATELETS: 114 x10ˆ3/uL — ABNORMAL LOW (ref 150–400)
RBC: 3.72 x10ˆ6/uL — ABNORMAL LOW (ref 3.85–5.22)
RDW-CV: 15.4 % (ref 11.5–15.5)
WBC: 4.4 x10ˆ3/uL (ref 3.7–11.0)

## 2024-05-23 LAB — PTT (PARTIAL THROMBOPLASTIN TIME): APTT: 32 s (ref 26.5–34.6)

## 2024-05-23 LAB — PT/INR
INR: 1.15 — ABNORMAL HIGH (ref 0.94–1.14)
PROTHROMBIN TIME: 13.3 s — ABNORMAL HIGH (ref 10.5–12.7)

## 2024-05-23 NOTE — Telephone Encounter (Signed)
 Holding for cancellation week of 11/24 for Ret Image appt with Wade/Scott/Bridget.

## 2024-05-23 NOTE — Anesthesia Preprocedure Evaluation (Signed)
 ANESTHESIA PRE-OP EVALUATION  Planned Procedure: ROBOTIC HYSTERECTOMY  SALPINGECTOMY BILATERAL (Bilateral)  REPAIR POSTERIOR  Review of Systems     anesthesia history negative     patient summary reviewed          Pulmonary   COPD, asthma, shortness of breath, sleep apnea and past history of smoking ,   Cardiovascular    Hypertension, ECG reviewed, Echo- EF 60, trace TR, AR and hyperlipidemia ,No peripheral edema,        GI/Hepatic/Renal    Colon polyps, GERD and liver disease        Endo/Other    Postmenopausal bleeding  Pelvic pain  Rectocele  , hypothyroidism, anemia, osteoarthritis and obesity,   type 2 diabetes    Neuro/Psych/MS    Restless leg syndrome  Chronic pain    , anxiety, Neck problems    peripheral neuropathy,  Cancer    negative hematology/oncology ROS,                     Physical Assessment      Airway       Mallampati: III    TM distance: 3 FB    Neck ROM: limited  Mouth Opening: fair.            Dental                    Pulmonary      (+) decreased breath sounds present        Cardiovascular    Rhythm: regular  Rate: Normal  (-) no friction rub, carotid bruit is not present, no peripheral edema and no murmur     Other findings              Plan  ASA 3     Planned anesthesia type: general     general anesthesia with endotracheal tube intubation      PONV Plan:  I plan to administer pharmcologic prophalaxis antiemetics              Intravenous induction     Anesthesia issues/risks discussed are: Dental Injuries, Post-op Pain Management, Post-op Agitation/Tantrum, Stroke, Aspiration, Sore Throat, Intraoperative Awareness/ Recall, Cardiac Events/MI, Post-op Cognitive Dysfunction, PONV and Blood Loss.  Anesthetic plan and risks discussed with patient  signed consent obtained          Patient's NPO status is appropriate for Anesthesia.           Plan discussed with surgeon.

## 2024-05-24 ENCOUNTER — Other Ambulatory Visit: Payer: Self-pay

## 2024-05-24 ENCOUNTER — Observation Stay
Admission: RE | Admit: 2024-05-24 | Discharge: 2024-05-25 | Disposition: A | Discharge status: Home / Self Care | Payer: Self-pay | Attending: Obstetrics & Gynecology | Admitting: Obstetrics & Gynecology

## 2024-05-24 ENCOUNTER — Inpatient Hospital Stay (HOSPITAL_COMMUNITY): Admitting: Certified Registered"

## 2024-05-24 ENCOUNTER — Encounter (HOSPITAL_COMMUNITY)
Admission: RE | Disposition: A | Discharge status: Home / Self Care | Payer: Self-pay | Source: Home / Self Care | Attending: Obstetrics & Gynecology

## 2024-05-24 ENCOUNTER — Encounter (HOSPITAL_COMMUNITY): Admitting: Certified Registered"

## 2024-05-24 ENCOUNTER — Encounter (HOSPITAL_COMMUNITY): Payer: Self-pay | Admitting: Obstetrics & Gynecology

## 2024-05-24 DIAGNOSIS — N840 Polyp of corpus uteri: Principal | ICD-10-CM | POA: Insufficient documentation

## 2024-05-24 DIAGNOSIS — R069 Unspecified abnormalities of breathing: Secondary | ICD-10-CM | POA: Insufficient documentation

## 2024-05-24 DIAGNOSIS — Z794 Long term (current) use of insulin: Secondary | ICD-10-CM | POA: Insufficient documentation

## 2024-05-24 DIAGNOSIS — R102 Pelvic and perineal pain unspecified side: Secondary | ICD-10-CM | POA: Diagnosis present

## 2024-05-24 DIAGNOSIS — Z9889 Other specified postprocedural states: Secondary | ICD-10-CM | POA: Insufficient documentation

## 2024-05-24 DIAGNOSIS — Z87891 Personal history of nicotine dependence: Secondary | ICD-10-CM | POA: Insufficient documentation

## 2024-05-24 DIAGNOSIS — N736 Female pelvic peritoneal adhesions (postinfective): Secondary | ICD-10-CM | POA: Insufficient documentation

## 2024-05-24 DIAGNOSIS — N816 Rectocele: Secondary | ICD-10-CM

## 2024-05-24 DIAGNOSIS — N80322 Deep endometriosis of the posterior cul-de-sac: Secondary | ICD-10-CM | POA: Insufficient documentation

## 2024-05-24 DIAGNOSIS — J432 Centrilobular emphysema: Secondary | ICD-10-CM | POA: Insufficient documentation

## 2024-05-24 DIAGNOSIS — Z90710 Acquired absence of both cervix and uterus: Principal | ICD-10-CM | POA: Insufficient documentation

## 2024-05-24 DIAGNOSIS — E1142 Type 2 diabetes mellitus with diabetic polyneuropathy: Secondary | ICD-10-CM | POA: Insufficient documentation

## 2024-05-24 DIAGNOSIS — E1129 Type 2 diabetes mellitus with other diabetic kidney complication: Secondary | ICD-10-CM | POA: Insufficient documentation

## 2024-05-24 DIAGNOSIS — D251 Intramural leiomyoma of uterus: Secondary | ICD-10-CM | POA: Insufficient documentation

## 2024-05-24 DIAGNOSIS — N950 Postmenopausal bleeding: Secondary | ICD-10-CM

## 2024-05-24 LAB — POC BLOOD GLUCOSE (RESULTS)
GLUCOSE, POC: 101 mg/dL (ref 60–110)
GLUCOSE, POC: 145 mg/dL — ABNORMAL HIGH (ref 60–110)
GLUCOSE, POC: 125 mg/dL — ABNORMAL HIGH (ref 60–110)
GLUCOSE, POC: 153 mg/dL — ABNORMAL HIGH (ref 60–110)

## 2024-05-24 SURGERY — ROBOTIC HYSTERECTOMY
Anesthesia: General | Wound class: Clean Contaminated Wounds-The respiratory, GI, Genital, or urinary

## 2024-05-24 MED ORDER — INSULIN LISPRO 100 UNIT/ML SUB-Q SSIP VIAL
1.0000 [IU] | INJECTION | Freq: Four times a day (QID) | SUBCUTANEOUS | Status: DC
Start: 2024-05-24 — End: 2024-05-24
  Administered 2024-05-24 (×2): 0 [IU] via SUBCUTANEOUS

## 2024-05-24 MED ORDER — SERTRALINE 50 MG TABLET
100.0000 mg | ORAL_TABLET | Freq: Every day | ORAL | Status: DC
Start: 2024-05-25 — End: 2024-05-25
  Administered 2024-05-25: 0 mg via ORAL
  Filled 2024-05-24: qty 2

## 2024-05-24 MED ORDER — ALBUTEROL SULFATE 2.5 MG/3 ML (0.083 %) SOLUTION FOR NEBULIZATION
2.5000 mg | INHALATION_SOLUTION | RESPIRATORY_TRACT | Status: DC | PRN
Start: 2024-05-24 — End: 2024-05-25

## 2024-05-24 MED ORDER — ESTRADIOL 0.01% (0.1 MG/GRAM) VAGINAL CREAM
TOPICAL_CREAM | Freq: Once | VAGINAL | Status: DC | PRN
Start: 2024-05-24 — End: 2024-05-24
  Administered 2024-05-24: 2 g via VAGINAL

## 2024-05-24 MED ORDER — SODIUM CHLORIDE 0.9 % (FLUSH) INJECTION SYRINGE
10.0000 mL | INJECTION | INTRAMUSCULAR | Status: DC | PRN
Start: 2024-05-24 — End: 2024-05-25
  Administered 2024-05-24: 10 mL

## 2024-05-24 MED ORDER — SCOPOLAMINE 1 MG OVER 3 DAYS TRANSDERMAL PATCH
MEDICATED_PATCH | Freq: Once | TRANSDERMAL | Status: DC | PRN
Start: 2024-05-24 — End: 2024-05-24
  Administered 2024-05-24: 1 via TRANSDERMAL

## 2024-05-24 MED ORDER — SODIUM CHLORIDE 0.9 % (FLUSH) INJECTION SYRINGE
10.0000 mL | INJECTION | Freq: Three times a day (TID) | INTRAMUSCULAR | Status: DC
Start: 2024-05-24 — End: 2024-05-24

## 2024-05-24 MED ORDER — CEFAZOLIN 2 GRAM SOLUTION FOR INJECTION
INTRAMUSCULAR | Status: AC
Start: 2024-05-24 — End: 2024-05-24
  Filled 2024-05-24: qty 6.06

## 2024-05-24 MED ORDER — BUDESONIDE 0.5 MG/2 ML SUSPENSION FOR NEBULIZATION
0.5000 mg | INHALATION_SUSPENSION | Freq: Two times a day (BID) | RESPIRATORY_TRACT | Status: DC
Start: 2024-05-24 — End: 2024-05-25
  Administered 2024-05-24 – 2024-05-25 (×2): 0.5 mg via RESPIRATORY_TRACT
  Filled 2024-05-24 (×2): qty 2

## 2024-05-24 MED ORDER — HYDROMORPHONE (PF) 0.5 MG/0.5 ML INJECTION SYRINGE
0.2000 mg | INJECTION | INTRAMUSCULAR | Status: DC | PRN
Start: 2024-05-24 — End: 2024-05-25
  Administered 2024-05-24 (×2): 0.2 mg via INTRAVENOUS
  Filled 2024-05-24 (×2): qty 0.5

## 2024-05-24 MED ORDER — OXYCODONE-ACETAMINOPHEN 5 MG-325 MG TABLET
1.0000 | ORAL_TABLET | ORAL | Status: DC | PRN
Start: 2024-05-24 — End: 2024-05-25
  Administered 2024-05-24 – 2024-05-25 (×3): 1 via ORAL
  Filled 2024-05-24 (×3): qty 1

## 2024-05-24 MED ORDER — SUGAMMADEX 100 MG/ML INTRAVENOUS SOLUTION
Freq: Once | INTRAVENOUS | Status: DC | PRN
Start: 2024-05-24 — End: 2024-05-24
  Administered 2024-05-24: 200 mg via INTRAVENOUS

## 2024-05-24 MED ORDER — MIDAZOLAM 1 MG/ML INJECTION WRAPPER
INTRAMUSCULAR | Status: AC
Start: 2024-05-24 — End: 2024-05-24
  Filled 2024-05-24: qty 2

## 2024-05-24 MED ORDER — PHENYLEPHRINE 10 MG/ML INJECTION SOLUTION
Freq: Once | INTRAMUSCULAR | Status: DC | PRN
Start: 2024-05-24 — End: 2024-05-24
  Administered 2024-05-24: 100 ug via INTRAVENOUS
  Administered 2024-05-24: 200 ug via INTRAVENOUS

## 2024-05-24 MED ORDER — PHENYLEPHRINE 10 MG/ML INJECTION SOLUTION
INTRAMUSCULAR | Status: AC
Start: 2024-05-24 — End: 2024-05-24
  Filled 2024-05-24: qty 1

## 2024-05-24 MED ORDER — PROPOFOL 10 MG/ML IV BOLUS
INJECTION | Freq: Once | INTRAVENOUS | Status: DC | PRN
Start: 2024-05-24 — End: 2024-05-24
  Administered 2024-05-24: 150 mg via INTRAVENOUS

## 2024-05-24 MED ORDER — DEXMEDETOMIDINE 10 MCG/ML IV DILUTION - ANES
Freq: Once | INTRAMUSCULAR | Status: DC | PRN
Start: 2024-05-24 — End: 2024-05-24
  Administered 2024-05-24: 30 ug via INTRAVENOUS
  Administered 2024-05-24 (×2): 20 ug via INTRAVENOUS

## 2024-05-24 MED ORDER — SODIUM CHLORIDE 0.9 % INTRAVENOUS SOLUTION
2.0000 g | Freq: Once | INTRAVENOUS | Status: AC
Start: 2024-05-24 — End: 2024-05-24
  Administered 2024-05-24 (×2): 2 g via INTRAVENOUS

## 2024-05-24 MED ORDER — ALBUTEROL SULFATE HFA 90 MCG/ACTUATION AEROSOL INHALER
INHALATION_SPRAY | Freq: Once | RESPIRATORY_TRACT | Status: DC | PRN
Start: 2024-05-24 — End: 2024-05-24
  Administered 2024-05-24 (×2): 2 via RESPIRATORY_TRACT

## 2024-05-24 MED ORDER — CEFAZOLIN 1 GRAM SOLUTION FOR INJECTION
Freq: Once | INTRAMUSCULAR | Status: DC | PRN
Start: 2024-05-24 — End: 2024-05-24
  Administered 2024-05-24: 2000 mg via INTRAVENOUS

## 2024-05-24 MED ORDER — SUGAMMADEX 100 MG/ML INTRAVENOUS SOLUTION
INTRAVENOUS | Status: AC
Start: 2024-05-24 — End: 2024-05-24
  Filled 2024-05-24: qty 2

## 2024-05-24 MED ORDER — VASOPRESSIN 20 UNIT/ML INTRAVENOUS SOLUTION
INTRAVENOUS | Status: AC
Start: 2024-05-24 — End: 2024-05-24
  Filled 2024-05-24: qty 1

## 2024-05-24 MED ORDER — KETAMINE 10 MG/ML INJECTION SOLUTION
Freq: Once | INTRAMUSCULAR | Status: DC | PRN
Start: 2024-05-24 — End: 2024-05-24
  Administered 2024-05-24: 20 mg via INTRAVENOUS

## 2024-05-24 MED ORDER — VASOPRESSIN 20 UNIT/ML INTRAVENOUS SOLUTION
Freq: Once | INTRAVENOUS | Status: DC | PRN
Start: 2024-05-24 — End: 2024-05-24
  Administered 2024-05-24: 20 [IU] via INTRAVENOUS

## 2024-05-24 MED ORDER — DEXTROSE 50 % IN WATER (D50W) INTRAVENOUS SYRINGE
12.5000 g | INJECTION | INTRAVENOUS | Status: DC | PRN
Start: 2024-05-24 — End: 2024-05-25

## 2024-05-24 MED ORDER — GLUCAGON 1 MG SOLUTION FOR INJECTION
1.0000 mg | Freq: Once | INTRAMUSCULAR | Status: DC | PRN
Start: 2024-05-24 — End: 2024-05-25

## 2024-05-24 MED ORDER — KETOROLAC 30 MG/ML (1 ML) INJECTION SOLUTION
Freq: Once | INTRAMUSCULAR | Status: DC | PRN
Start: 2024-05-24 — End: 2024-05-24
  Administered 2024-05-24: 30 mg via INTRAVENOUS

## 2024-05-24 MED ORDER — SCOPOLAMINE 1 MG OVER 3 DAYS TRANSDERMAL PATCH
MEDICATED_PATCH | TRANSDERMAL | Status: AC
Start: 2024-05-24 — End: 2024-05-24
  Filled 2024-05-24: qty 1

## 2024-05-24 MED ORDER — DEXMEDETOMIDINE 100 MCG/ML INTRAVENOUS SOLUTION
INTRAVENOUS | Status: AC
Start: 2024-05-24 — End: 2024-05-24
  Filled 2024-05-24: qty 2

## 2024-05-24 MED ORDER — BUPIVACAINE (PF) 0.5 % (5 MG/ML) INJECTION SOLUTION
INTRAMUSCULAR | Status: AC
Start: 2024-05-24 — End: 2024-05-24
  Filled 2024-05-24: qty 30

## 2024-05-24 MED ORDER — ONDANSETRON HCL (PF) 4 MG/2 ML INJECTION SOLUTION
INTRAMUSCULAR | Status: AC
Start: 2024-05-24 — End: 2024-05-24
  Filled 2024-05-24: qty 2

## 2024-05-24 MED ORDER — ACETAMINOPHEN 1,000 MG/100 ML (10 MG/ML) INTRAVENOUS SOLUTION
INTRAVENOUS | Status: AC
Start: 2024-05-24 — End: 2024-05-24
  Filled 2024-05-24: qty 100

## 2024-05-24 MED ORDER — LEVOTHYROXINE 125 MCG TABLET
125.0000 ug | ORAL_TABLET | Freq: Every morning | ORAL | Status: DC
Start: 2024-05-25 — End: 2024-05-25
  Administered 2024-05-25: 0 ug via ORAL
  Filled 2024-05-24: qty 1

## 2024-05-24 MED ORDER — HYDROCHLOROTHIAZIDE 25 MG TABLET
25.0000 mg | ORAL_TABLET | Freq: Every day | ORAL | Status: DC
Start: 2024-05-25 — End: 2024-05-25
  Administered 2024-05-25: 0 mg via ORAL
  Filled 2024-05-24: qty 1

## 2024-05-24 MED ORDER — LACTATED RINGERS INTRAVENOUS SOLUTION
INTRAVENOUS | Status: DC | PRN
Start: 2024-05-24 — End: 2024-05-24
  Administered 2024-05-24: 0 via INTRAVENOUS

## 2024-05-24 MED ORDER — MIDAZOLAM (PF) 1 MG/ML INJECTION SOLUTION
Freq: Once | INTRAMUSCULAR | Status: DC | PRN
Start: 2024-05-24 — End: 2024-05-24
  Administered 2024-05-24: 2 mg via INTRAVENOUS

## 2024-05-24 MED ORDER — ONDANSETRON HCL (PF) 4 MG/2 ML INJECTION SOLUTION
4.0000 mg | Freq: Four times a day (QID) | INTRAMUSCULAR | Status: DC | PRN
Start: 2024-05-24 — End: 2024-05-25

## 2024-05-24 MED ORDER — FENTANYL (PF) 50 MCG/ML INJECTION SOLUTION
INTRAMUSCULAR | Status: AC
Start: 2024-05-24 — End: 2024-05-24
  Filled 2024-05-24: qty 2

## 2024-05-24 MED ORDER — IBUPROFEN 400 MG TABLET
400.0000 mg | ORAL_TABLET | Freq: Four times a day (QID) | ORAL | Status: DC | PRN
Start: 2024-05-24 — End: 2024-05-25

## 2024-05-24 MED ORDER — ESTRADIOL 0.01% (0.1 MG/GRAM) VAGINAL CREAM
TOPICAL_CREAM | VAGINAL | Status: AC
Start: 2024-05-24 — End: 2024-05-24
  Filled 2024-05-24: qty 42.5

## 2024-05-24 MED ORDER — PANTOPRAZOLE 40 MG TABLET,DELAYED RELEASE
40.0000 mg | DELAYED_RELEASE_TABLET | Freq: Every evening | ORAL | Status: DC
Start: 2024-05-24 — End: 2024-05-25
  Administered 2024-05-24: 0 mg via ORAL
  Filled 2024-05-24: qty 1

## 2024-05-24 MED ORDER — SODIUM CHLORIDE 0.9 % (FLUSH) INJECTION SYRINGE
10.0000 mL | INJECTION | Freq: Three times a day (TID) | INTRAMUSCULAR | Status: DC
Start: 2024-05-24 — End: 2024-05-25
  Administered 2024-05-24: 10 mL
  Administered 2024-05-24 – 2024-05-25 (×2): 0 mL

## 2024-05-24 MED ORDER — BLOOD SUGAR DIAGNOSTIC STRIPS
1.0000 | ORAL_STRIP | Freq: Four times a day (QID) | Status: DC
Start: 2024-05-24 — End: 2024-05-24

## 2024-05-24 MED ORDER — LISINOPRIL 20 MG TABLET
20.0000 mg | ORAL_TABLET | Freq: Every day | ORAL | Status: DC
Start: 2024-05-25 — End: 2024-05-25
  Administered 2024-05-25: 0 mg via ORAL
  Filled 2024-05-24: qty 1

## 2024-05-24 MED ORDER — ALUMINUM-MAG HYDROXIDE-SIMETHICONE 400 MG-400 MG-40 MG/5 ML ORAL SUSP
30.0000 mL | ORAL | Status: DC | PRN
Start: 2024-05-24 — End: 2024-05-25

## 2024-05-24 MED ORDER — CEFAZOLIN 1 GRAM SOLUTION FOR INJECTION
INTRAMUSCULAR | Status: AC
Start: 2024-05-24 — End: 2024-05-24
  Filled 2024-05-24: qty 20

## 2024-05-24 MED ORDER — INSULIN LISPRO 100 UNIT/ML SUB-Q SSIP PEN
2.0000 [IU] | INJECTION | Freq: Four times a day (QID) | SUBCUTANEOUS | Status: DC
Start: 2024-05-24 — End: 2024-05-25
  Administered 2024-05-24 – 2024-05-25 (×3): 0 [IU] via SUBCUTANEOUS

## 2024-05-24 MED ORDER — BUPIVACAINE-EPINEPHRINE 0.25 %-1:200,000 INJECTION SOLUTION
INTRAMUSCULAR | Status: AC
Start: 2024-05-24 — End: 2024-05-24
  Filled 2024-05-24: qty 50

## 2024-05-24 MED ORDER — ALBUTEROL SULFATE HFA 90 MCG/ACTUATION AEROSOL INHALER
INHALATION_SPRAY | RESPIRATORY_TRACT | Status: AC
Start: 2024-05-24 — End: 2024-05-24
  Filled 2024-05-24: qty 6.7

## 2024-05-24 MED ORDER — PROPOFOL 10 MG/ML IV BOLUS
INJECTION | INTRAVENOUS | Status: AC
Start: 2024-05-24 — End: 2024-05-24
  Filled 2024-05-24: qty 20

## 2024-05-24 MED ORDER — BUPIVACAINE (PF) 0.5 % (5 MG/ML) INJECTION SOLUTION
Freq: Once | INTRAMUSCULAR | Status: DC | PRN
Start: 2024-05-24 — End: 2024-05-24
  Administered 2024-05-24: 30 mL via INTRAMUSCULAR

## 2024-05-24 MED ORDER — LACTATED RINGERS INTRAVENOUS SOLUTION
INTRAVENOUS | Status: DC
Start: 2024-05-24 — End: 2024-05-24

## 2024-05-24 MED ORDER — BUPIVACAINE-EPINEPHRINE 0.25 %-1:200,000 INJECTION SOLUTION
Freq: Once | INTRAMUSCULAR | Status: DC | PRN
Start: 2024-05-24 — End: 2024-05-24
  Administered 2024-05-24: 30 mL via INTRAMUSCULAR

## 2024-05-24 MED ORDER — SODIUM CHLORIDE 0.9 % (FLUSH) INJECTION SYRINGE
10.0000 mL | INJECTION | INTRAMUSCULAR | Status: DC | PRN
Start: 2024-05-24 — End: 2024-05-24

## 2024-05-24 MED ORDER — SIMETHICONE 80 MG CHEWABLE TABLET
80.0000 mg | CHEWABLE_TABLET | Freq: Four times a day (QID) | ORAL | Status: DC | PRN
Start: 2024-05-24 — End: 2024-05-25
  Administered 2024-05-24: 80 mg via ORAL
  Filled 2024-05-24: qty 1

## 2024-05-24 MED ORDER — ONDANSETRON HCL (PF) 4 MG/2 ML INJECTION SOLUTION
4.0000 mg | Freq: Once | INTRAMUSCULAR | Status: DC | PRN
Start: 2024-05-24 — End: 2024-05-24

## 2024-05-24 MED ORDER — REVEFENACIN 175 MCG/3 ML SOLUTION FOR NEBULIZATION
175.0000 ug | INHALATION_SOLUTION | Freq: Every day | RESPIRATORY_TRACT | Status: DC
Start: 2024-05-25 — End: 2024-05-25
  Administered 2024-05-25: 175 ug via RESPIRATORY_TRACT
  Filled 2024-05-24: qty 3

## 2024-05-24 MED ORDER — AMITRIPTYLINE 25 MG TABLET
25.0000 mg | ORAL_TABLET | Freq: Every evening | ORAL | Status: DC
Start: 2024-05-24 — End: 2024-05-25
  Administered 2024-05-24: 0 mg via ORAL
  Filled 2024-05-24: qty 1

## 2024-05-24 MED ORDER — FENTANYL (PF) 50 MCG/ML INJECTION SOLUTION
Freq: Once | INTRAMUSCULAR | Status: DC | PRN
Start: 2024-05-24 — End: 2024-05-24
  Administered 2024-05-24 (×2): 100 ug via INTRAVENOUS

## 2024-05-24 MED ORDER — ALBUTEROL SULFATE 2.5 MG/3 ML (0.083 %) SOLUTION FOR NEBULIZATION
5.0000 mg | INHALATION_SOLUTION | Freq: Once | RESPIRATORY_TRACT | Status: DC | PRN
Start: 2024-05-24 — End: 2024-05-24

## 2024-05-24 MED ORDER — ROCURONIUM 10 MG/ML INTRAVENOUS SOLUTION
Freq: Once | INTRAVENOUS | Status: DC | PRN
Start: 2024-05-24 — End: 2024-05-24
  Administered 2024-05-24: 20 mg via INTRAVENOUS
  Administered 2024-05-24 (×2): 50 mg via INTRAVENOUS

## 2024-05-24 MED ORDER — KETAMINE 20 MG/2 ML (10 MG/ML) IN 0.9 % SODIUM CHLORIDE IV SYRINGE
INJECTION | INTRAVENOUS | Status: AC
Start: 2024-05-24 — End: 2024-05-24
  Filled 2024-05-24: qty 2

## 2024-05-24 MED ORDER — OXYCODONE-ACETAMINOPHEN 5 MG-325 MG TABLET
1.0000 | ORAL_TABLET | Freq: Once | ORAL | Status: DC | PRN
Start: 2024-05-24 — End: 2024-05-24

## 2024-05-24 MED ORDER — PROMETHAZINE 25 MG RECTAL SUPPOSITORY
12.5000 mg | Freq: Four times a day (QID) | RECTAL | Status: DC | PRN
Start: 2024-05-24 — End: 2024-05-25

## 2024-05-24 MED ORDER — GABAPENTIN 300 MG CAPSULE
300.0000 mg | ORAL_CAPSULE | Freq: Two times a day (BID) | ORAL | Status: DC
Start: 2024-05-24 — End: 2024-05-25
  Administered 2024-05-24 – 2024-05-25 (×2): 0 mg via ORAL
  Filled 2024-05-24 (×2): qty 1

## 2024-05-24 MED ORDER — ARFORMOTEROL 15 MCG/2 ML SOLUTION FOR NEBULIZATION
15.0000 ug | INHALATION_SOLUTION | Freq: Two times a day (BID) | RESPIRATORY_TRACT | Status: DC
Start: 2024-05-24 — End: 2024-05-25
  Administered 2024-05-24 – 2024-05-25 (×2): 15 ug via RESPIRATORY_TRACT
  Filled 2024-05-24 (×2): qty 1

## 2024-05-24 MED ORDER — DEXTROSE 40 % ORAL GEL
15.0000 g | ORAL | Status: DC | PRN
Start: 2024-05-24 — End: 2024-05-24

## 2024-05-24 MED ORDER — LIDOCAINE HCL 10 MG/ML (1 %) INJECTION SOLUTION
INTRAMUSCULAR | Status: AC
Start: 2024-05-24 — End: 2024-05-24
  Filled 2024-05-24: qty 20

## 2024-05-24 MED ORDER — GLUCAGON 1 MG SOLUTION FOR INJECTION
1.0000 mg | Freq: Once | INTRAMUSCULAR | Status: DC | PRN
Start: 2024-05-24 — End: 2024-05-24

## 2024-05-24 MED ORDER — KETOROLAC 30 MG/ML (1 ML) INJECTION SOLUTION
INTRAMUSCULAR | Status: AC
Start: 2024-05-24 — End: 2024-05-24
  Filled 2024-05-24: qty 1

## 2024-05-24 MED ORDER — DEXAMETHASONE SODIUM PHOSPHATE 4 MG/ML INJECTION SOLUTION
INTRAMUSCULAR | Status: AC
Start: 2024-05-24 — End: 2024-05-24
  Filled 2024-05-24: qty 1

## 2024-05-24 MED ORDER — SENNOSIDES 8.6 MG-DOCUSATE SODIUM 50 MG TABLET
1.0000 | ORAL_TABLET | Freq: Two times a day (BID) | ORAL | Status: DC
Start: 2024-05-24 — End: 2024-05-25
  Administered 2024-05-24 – 2024-05-25 (×2): 1 via ORAL
  Filled 2024-05-24 (×2): qty 1

## 2024-05-24 MED ORDER — ACETAMINOPHEN 1,000 MG/100 ML (10 MG/ML) INTRAVENOUS SOLUTION
Freq: Once | INTRAVENOUS | Status: DC | PRN
Start: 2024-05-24 — End: 2024-05-24
  Administered 2024-05-24: 1000 mg via INTRAVENOUS

## 2024-05-24 MED ORDER — BACLOFEN 10 MG TABLET
10.0000 mg | ORAL_TABLET | Freq: Four times a day (QID) | ORAL | Status: DC
Start: 2024-05-24 — End: 2024-05-25
  Administered 2024-05-24 – 2024-05-25 (×4): 0 mg via ORAL
  Filled 2024-05-24 (×2): qty 1

## 2024-05-24 MED ORDER — DEXTROSE 50 % IN WATER (D50W) INTRAVENOUS SYRINGE
12.5000 g | INJECTION | INTRAVENOUS | Status: DC | PRN
Start: 2024-05-24 — End: 2024-05-24

## 2024-05-24 MED ORDER — SODIUM CHLORIDE 0.9 % INTRAVENOUS SOLUTION
INTRAVENOUS | Status: DC
Start: 2024-05-24 — End: 2024-05-25

## 2024-05-24 MED ORDER — DEXTROSE 40 % ORAL GEL
15.0000 g | ORAL | Status: DC | PRN
Start: 2024-05-24 — End: 2024-05-25

## 2024-05-24 MED ORDER — ONDANSETRON HCL (PF) 4 MG/2 ML INJECTION SOLUTION
Freq: Once | INTRAMUSCULAR | Status: DC | PRN
Start: 2024-05-24 — End: 2024-05-24
  Administered 2024-05-24: 4 mg via INTRAVENOUS

## 2024-05-24 SURGICAL SUPPLY — 49 items
ADH SKNCLS CYNCRLT EXOFIN HVSC STRL TISS LF  DISP 1G (MED SURG SUPPLIES) IMPLANT
APPL 70% ISPRP 2% CHG 26ML CHLRPRP HI-LT ORNG PREP STRL LF  DISP CLR (MED SURG SUPPLIES) IMPLANT
BAG ICE STAY DRY LARGE WHITE DISP LF (MED SURG SUPPLIES) IMPLANT
BANDAGE 4.1YDX4.5IN 6 PLY HYPOALL COTTON LRG GAUZE WHT STRL LF  DISP (WOUND CARE SUPPLY) IMPLANT
BLADE 15 BD RB-BCK CBNSTL SURG TISS STRL LF  DISP (SURGICAL CUTTING SUPPLIES) IMPLANT
BLANKET MISTRAL-AIR ADULT UPR BODY 79X29.9IN FRC AIR HI VOL BLWR INTUITIVE CONTROL PNL LRG LED (MED SURG SUPPLIES) IMPLANT
COUNTER 40 CNT MAG DVN BOXLOCKS STRL LF  BLK SHARP 1840 PLASTIC FOAM XL DISP (MED SURG SUPPLIES) ×2 IMPLANT
COVER FLXB STRL LF  LIGHT HNDL PLASTIC DISP GRN (MED SURG SUPPLIES) IMPLANT
CURETTE DIAG MCKESSON ARGENT 3MM ENDMTRL POLYPROP FLXB SNGL END HNDL STRL LF  DISP (MED SURG SUPPLIES) ×2 IMPLANT
DCNTR VIAL DISPENSR VLJT DISP STRL LF  IV ASPT TRANSF (IV TUBING & ACCESSORIES) IMPLANT
DRAPE ARM 21X19X10.5IN DAVINCI XI EQP 21LB (DRAPE/PACKS/SHEETS/OR TOWEL) ×8 IMPLANT
DRAPE CLMN DAVINCI XI EQP (DRAPE/PACKS/SHEETS/OR TOWEL) IMPLANT
DRAPE OB LITH UNDR BUTT FLUID COLLECT PCH SUCT PORT GRAD MRK ADH CLSR 44X40IN STRL SURG POLY CLR (DRAPE/PACKS/SHEETS/OR TOWEL) IMPLANT
ELECTRODE PATIENT RTN 9FT VLAB C30- LB RM PHSV ACRL FOAM CORD NONIRRITATE NONSENSITIZE ADH STRP (SURGICAL CUTTING SUPPLIES) IMPLANT
GARMENT COMPRESS FOOT NYL FBRC VASOPRESS LTWT ADJ PUL TAB INTRR BLADDER CONN BLU 13- IN (MED SURG SUPPLIES) IMPLANT
GLOVE SURG 6 LF  PF BEAD CUF STRL LIGHT BRN 11.1IN PROTEXIS NPRN NTR THK6.7 MIL INDPD THB (GLOVES AND ACCESSORIES) IMPLANT
GLOVE SURG 6.5 LF  PF BEAD CUF STRL LIGHT BRN 11.1IN PROTEXIS NPRN NTR THK6.7 MIL INDPD THB (GLOVES AND ACCESSORIES) IMPLANT
GLOVE SURG 7 LF  PF BEAD CUF INDPD THB INTLK STRL LIGHT BRN 11.7IN PROTEXIS NPRN NTR THK6.7 MIL (GLOVES AND ACCESSORIES) IMPLANT
GLOVE SURG 7.5 LF  PF INTLK BEAD CUF INDPD THB STRL LIGHT BRN 11.7IN PROTEXIS NPRN NTR THK6.7 MIL (GLOVES AND ACCESSORIES) IMPLANT
GOWN SURG XL STD LGTH L3 NONREINFORCE HKLP CLSR TWL STRL LF DISP BLU SPECTRUM SMS (DRAPE/PACKS/SHEETS/OR TOWEL) IMPLANT
GRASPER LAPSCP GRAB ATRAUMA PAD SFT INSRT 22.6MM SATR PLMR DISP (ENDOSCOPIC SUPPLIES) IMPLANT
HANDLE SUCT PL GNTL SUMP STRL LF  DISP (MED SURG SUPPLIES) IMPLANT
IRR SUCT 32.48CM ENDOWRIST 1.2CM (MED SURG SUPPLIES) IMPLANT
IRR SUCT STRKFL2 TIP STRL LF  DISP (ENDOSCOPIC SUPPLIES) IMPLANT
LEGGINGS SURG 48X31IN CUF PRXM SMS 6IN STRL LF  DISP (DRAPE/PACKS/SHEETS/OR TOWEL) IMPLANT
NEEDLE HYPO  21GA 1.5IN TW SFGLD POLYPROP REG BVL LL SHIELD MECH DEHP-FR IM GRN STRL LF  DISP (MED SURG SUPPLIES) IMPLANT
NEEDLE SPINAL BLK 3.5IN 22GA QUINCKE REG WL POLYPROP QUINCKE TIP STRL LF  DISP (MED SURG SUPPLIES) IMPLANT
OBTURATOR LAPSCP 8MM WECK VST BLDLS STRL LF  DISP (ROBOTIC SUPPLIES) ×2 IMPLANT
PACK SURG ULTRA MAYO BASIC VI UTIL DRP TAPE BCK TBL CVR STRL DISP 90X44IN 26X15IN LF (CUSTOM TRAYS & PACK) IMPLANT
PEN SURG MRKNG SKIN RLR LRG RESERVOIR REG TIP LBL VIOL STRL LF  6IN (MED SURG SUPPLIES) IMPLANT
POSITION SURGYPAD 36IN BUIL IN ARM PRTC ANTISKID STRP ADJ BODY STRAP VELCRO SYSTEM LF (MED SURG SUPPLIES) IMPLANT
SEAL CANNULA 5-12 MM UNIVERSAL DISPOSABLE (ROBOTIC SUPPLIES) IMPLANT
SEALER TISS DAVINCI SLIM JAW XTD DISP (SURGICAL CUTTING SUPPLIES) IMPLANT
SOL ANFG DFGR ISOPRPNL PAD OVAL BTL NABRSV ADH STRL LF  DISP (ENDOSCOPIC SUPPLIES) IMPLANT
SPONGE LAP 18X4IN 4 PLY RADOPQ PYRONEMA FREE ABS COTTON STRL LF  DISP 7IN PED (MED SURG SUPPLIES) IMPLANT
SPONGE SURG 4X4IN 16 PLY XRY DETECT COTTON STRL LF  DISP (WOUND CARE SUPPLY) ×4 IMPLANT
STOCKING COMPRESS XL REG THG LGTH CAP NYL 29-31IN 25-30IN 16-19IN LF  ASST (MED SURG SUPPLIES) IMPLANT
STRIP 3X.25IN STRSTRP PLSTR REINF SKNCLS WHT STRL LF (WOUND CARE SUPPLY) IMPLANT
SUTURE 0 CT2 VICRYL+ 27IN VIOL BRD ANBCTRL COAT ABS (SUTURE/WOUND CLOSURE) IMPLANT
SUTURE 0 GS-21 VLOC 180 9IN GRN TAPER PNT ABS 2 ANG CUT BARB ABS (SUTURE AIDS) ×2 IMPLANT
SUTURE 4-0 PS1 VICRYL MTPS 27IN UNDYED BRD COAT ABS (SUTURE/WOUND CLOSURE) IMPLANT
SYRINGE 60ML LF  STRL LID RIGID PAD BULB TIP IRRG (MED SURG SUPPLIES) ×2 IMPLANT
SYRINGE LL 10ML LF  STRL CONTROL CONCEN TIP PRGN FREE DEHP-FR MED DISP (MED SURG SUPPLIES) IMPLANT
SYRINGE LL 30ML LF  STRL CONCEN TIP GRAD N-PYRG DEHP-FR MED DISP (MED SURG SUPPLIES) IMPLANT
TIP CAUT HOT SHR DAVINCI ENDOWRIST 8MM STD CAUT MONOPOL DISP (ROBOTIC SUPPLIES) ×2 IMPLANT
TRAY CATH 16FR FOLEY ANTIREFLUX PVP SWBSTK SIL ADULT 10ML STRL LF (UROLOGICAL SUPPLIES) IMPLANT
TRAY GENERAL PACK - JACKSON GEN HOSP (CUSTOM TRAYS & PACK) IMPLANT
TRAY SKIN SCRUB VNYL WET SM WNG SPONGE STICK CTNTP APPL LF (MED SURG SUPPLIES) ×2 IMPLANT
TUBING INSFL PNEUMOCLEAR SET HT HIFLO (MED SURG SUPPLIES) IMPLANT

## 2024-05-24 NOTE — Interval H&P Note (Signed)
 [facility]      H&P UPDATE FORM                                                                                  Angela Frey, Angela Frey, 63 y.o. female  Date of Admission:  05/24/2024  Date of Birth:  1961/04/13    05/24/2024    STOP: IF H&P IS GREATER THAN 30 DAYS FROM SURGICAL DAY COMPLETE NEW H&P IS REQUIRED.     H & P updated the day of the procedure.  1.  H&P completed within 30 days of surgical procedure and has been reviewed within 24 hours of admission but prior to surgery or a procedure requiring anesthesia services. The patient denies any ill signs or symptoms. The patient has been examined, and no change has occured in the patients condition since the H&P was completed.       Change in medications: No        No LMP recorded (lmp unknown). Patient is postmenopausal.      Comments:     2.  Patient continues to be appropriate candidate for planned surgical procedure. YES      Angela Frey E Angela Umana, DO

## 2024-05-24 NOTE — OR Nursing (Signed)
 Uterus, tubes, and ovaries - 52 grams in weight

## 2024-05-24 NOTE — Nurses Notes (Signed)
 Pt. Arouses to voice but remains lethargic. Reports no pain. Incisions clean dry intact, foley in place draining clear yellow urine. IV fluids infusing. All safety measures in place with ice pack placed on abdomen.

## 2024-05-24 NOTE — OR PreOp (Signed)
 Spoke with patient in pre-op to inquire about allergies and metal in her body. She states that she can not wear her compression stocking on her right foot and asks for it to be removed because it is hurting too much.

## 2024-05-24 NOTE — Nursing Note (Signed)
 THERE ARE 4 SMALL INCISIONS TO LOWER ABDOMEN.  1 ABOVE UMBILICUS, 1 TO LOWER RIGHT ABDOMEN, 2 TO LEFT LOWER ABDOMEN.  ALL ARE CLEAN, DRY AND INTACT COVERED WITH STERI STRIPS AND EXOFIN.

## 2024-05-24 NOTE — OR PostOp (Signed)
 PT CONTINUES TO DO WELL.  PT IS RESTING WITH EYES CLOSED.  CANNOT GIVE A NUMBER FOR PAIN ASSUMING She IS NOT HAVING PAIN AT THIS TIME.  PT WILL BE TAKEN TO MED/SURG FLOOR SOON.

## 2024-05-24 NOTE — Care Plan (Signed)
 IPOC Initiated   Problem: Adult Inpatient Plan of Care  Goal: Absence of Hospital-Acquired Illness or Injury  Outcome: Ongoing (see interventions/notes)  Goal: Optimal Comfort and Wellbeing  Outcome: Ongoing (see interventions/notes)  Goal: Rounds/Family Conference  Outcome: Ongoing (see interventions/notes)     Problem: Wound  Goal: Optimal Coping  Outcome: Ongoing (see interventions/notes)  Goal: Optimal Functional Ability  Outcome: Ongoing (see interventions/notes)  Goal: Absence of Infection Signs and Symptoms  Outcome: Ongoing (see interventions/notes)  Goal: Improved Oral Intake  Outcome: Ongoing (see interventions/notes)  Goal: Optimal Pain Control and Function  Outcome: Ongoing (see interventions/notes)  Goal: Skin Health and Integrity  Outcome: Ongoing (see interventions/notes)  Goal: Optimal Wound Healing  Outcome: Ongoing (see interventions/notes)     Problem: Wound  Goal: Optimal Coping  05/24/2024 1324 by Carlyon DEL, RN  Outcome: Ongoing (see interventions/notes)  05/24/2024 1324 by Carlyon DEL, RN  Outcome: Ongoing (see interventions/notes)  Goal: Optimal Functional Ability  05/24/2024 1324 by Carlyon DEL, RN  Outcome: Ongoing (see interventions/notes)  05/24/2024 1324 by Carlyon DEL, RN  Outcome: Ongoing (see interventions/notes)  Goal: Absence of Infection Signs and Symptoms  05/24/2024 1324 by Carlyon DEL, RN  Outcome: Ongoing (see interventions/notes)  05/24/2024 1324 by Carlyon DEL, RN  Outcome: Ongoing (see interventions/notes)  Goal: Improved Oral Intake  05/24/2024 1324 by Carlyon DEL, RN  Outcome: Ongoing (see interventions/notes)  05/24/2024 1324 by Carlyon DEL, RN  Outcome: Ongoing (see interventions/notes)  Goal: Optimal Pain Control and Function  05/24/2024 1324 by Carlyon DEL, RN  Outcome: Ongoing (see interventions/notes)  05/24/2024 1324 by Carlyon DEL, RN  Outcome: Ongoing (see interventions/notes)  Goal: Skin Health and Integrity  05/24/2024 1324 by Carlyon DEL, RN  Outcome: Ongoing (see interventions/notes)  05/24/2024 1324  by Carlyon DEL, RN  Outcome: Ongoing (see interventions/notes)  Goal: Optimal Wound Healing  05/24/2024 1324 by Carlyon DEL, RN  Outcome: Ongoing (see interventions/notes)  05/24/2024 1324 by Carlyon DEL, RN  Outcome: Ongoing (see interventions/notes)     Problem: Hysterectomy Total or Partial  Goal: Absence of Bleeding  Outcome: Ongoing (see interventions/notes)  Goal: Effective Bowel Elimination  Outcome: Ongoing (see interventions/notes)  Goal: Fluid and Electrolyte Balance  Outcome: Ongoing (see interventions/notes)  Goal: Absence of Infection Signs and Symptoms  Outcome: Ongoing (see interventions/notes)  Goal: Anesthesia/Sedation Recovery  Outcome: Ongoing (see interventions/notes)  Goal: Optimal Pain Control and Function  Outcome: Ongoing (see interventions/notes)  Goal: Nausea and Vomiting Relief  Outcome: Ongoing (see interventions/notes)  Goal: Effective Urinary Elimination  Outcome: Ongoing (see interventions/notes)  Goal: Effective Oxygenation and Ventilation  Outcome: Ongoing (see interventions/notes)     Problem: Pain Acute  Goal: Optimal Pain Control and Function  Outcome: Ongoing (see interventions/notes)

## 2024-05-24 NOTE — Anesthesia Postprocedure Evaluation (Signed)
 Anesthesia Post Op Evaluation    Patient: Angela Frey  Procedure(s):  ROBOTIC HYSTERECTOMY WITH LYSIS OF ADHESIONS  SALPINOOPHERECTOMY BILATERAL  REPAIR POSTERIOR    Last Vitals:Temperature: 36.4 C (97.6 F) (05/24/24 1149)  Heart Rate: 80 (05/24/24 1149)  BP (Non-Invasive): (!) 87/40 (05/24/24 1149)  Respiratory Rate: 17 (05/24/24 1149)  SpO2: 94 % (05/24/24 1149)    No notable events documented.    Patient is sufficiently recovered from the effects of anesthesia to participate in the evaluation and has returned to their pre-procedure level.  Patient location during evaluation: PACU       Patient participation: complete - patient participated  Level of consciousness: responsive to verbal stimuli and sleepy but conscious    Pain score: 0  Pain management: adequate  Airway patency: patent    Anesthetic complications: no  Cardiovascular status: acceptable  Respiratory status: acceptable  Hydration status: acceptable  Patient post-procedure temperature: Pt Normothermic   PONV Status: Absent

## 2024-05-24 NOTE — OR PostOp (Signed)
 PT CONTINUES TO DO WELL.  IS WAKING UP MORE INTERACTING WITH STAFF.  PT STATES MILD PAIN TO LOWER ABDOMEN 2-3/10.   ICE PACK TO LOWER ABDOMEN.  PT WILL BE TAKEN TO MED/SURG FLOOR IN STABLE CONDITION.

## 2024-05-24 NOTE — Anesthesia Transfer of Care (Signed)
 ANESTHESIA TRANSFER OF CARE   Angela Frey is a 63 y.o. ,female,     had Procedure(s):  ROBOTIC HYSTERECTOMY WITH LYSIS OF ADHESIONS  SALPINOOPHERECTOMY BILATERAL  REPAIR POSTERIOR  performed  05/24/2024   Primary Service: Sherryle FORBES Risser,*    Past Medical History:   Diagnosis Date    Asthma     Centrilobular emphysema     Chronic obstructive airway disease     Chronic respiratory failure with hypoxia (CMS HCC)     Hypertension     Hypothyroidism     OSA (obstructive sleep apnea)     SOB (shortness of breath)     Type 2 diabetes mellitus       Allergy History as of 05/24/24       CLINDAMYCIN         Noted Status Severity Type Reaction    09/10/19 1855 Johnny Richerd CROME, NP 09/10/19 Active Low  Hives/ Urticaria              DOXYLAMIN-PSE-DM-ACETAMINOPHEN          Noted Status Severity Type Reaction    06/01/23 0849 Louise Jenny, CMA 09/10/19 Active Medium   Other Adverse Reaction (Add comment), Hives/ Urticaria, Rash    07/09/20 0731 Clayborne Daphne SAUNDERS, LPN 96/98/78 Active Low  Hives/ Urticaria,  Other Adverse Reaction (Add comment)    09/10/19 1857 Johnny Richerd CROME, NP 09/10/19 Active Low  Hives/ Urticaria              BENZONATATE         Noted Status Severity Type Reaction    02/20/20 1313 Clayborne Daphne SAUNDERS, LPN 91/88/78 Active Low  Hives/ Urticaria              CLINDAMYCIN PHOSPHATE         Noted Status Severity Type Reaction    11/04/20 1619 Overmiller, Lupita CROME, MD 11/04/20 Active                 PSEUDOEPHEDRINE HCL         Noted Status Severity Type Reaction    04/23/22 1357 Plumley, Jenny, CMA 11/11/14 Active High  Hives/ Urticaria              GUAIFENESIN          Noted Status Severity Type Reaction    04/23/22 1357 Plumley, [name], CMA 05/05/18 Active    Other Adverse Reaction (Add comment)                  I completed my transfer of care / handoff to the receiving personnel during which we discussed:  Access, Airway, All key/critical aspects of case discussed, Antibiotics, Analgesia,  Expectation of post procedure, Fluids/Product and Gave opportunity for questions and acknowledgement of understanding  Report given to: Adine Oneil LABOR, RN    Post Location: PACU                                                           Last OR Temp: Temperature: 36.4 C (97.6 F)      Airway:* No LDAs found *  Blood pressure (!) 87/40, pulse 80, temperature 36.4 C (97.6 F), resp. rate 17, SpO2 94%, not currently breastfeeding.

## 2024-05-24 NOTE — OR PostOp (Signed)
 PT ARRIVED IN PACU ON MED/SURG BED WITH RN AND CRNA PRESENT.  NO ACUTE DISTRESS NOTED AS PT IS SLEEPING AT THIS TIME.  PT DOES MINIMALLY RESPOND TO VERBAL STIMULI.  WE WILL CONTINUE TO MONITOR.  PT HAS FOLEY CATHETER PRESENT WITH APPROXIMATELY 550CC OUT.  IV LR RUNNING AT THIS TIME.  ABDOMINAL INCISIONS ASSESSED AND DOCUMENTED.

## 2024-05-24 NOTE — H&P (Signed)
 Posted from clinic visit    [facility], [facility] OFF[facility]  [address]  [place] [address]  [phone]     Angela Frey 04/12/2024   [id number] 11/27/1960      Chief Complaint: Pre-op (Plan for RATLH, possible BSO/Possible AR/PR/Pt has Echo for 04/13/24. )        Subjective: Angela Frey is a 63 y.o. female presenting for preop visit. Pt has been having PMG. Endometrial sampling benign. Pt failed progesterone therapy. She would like hysterectomy.     Pt had referral to cardiology for clearance. Pt has TEE tomorrow.      Medical History   I have reviewed and updated as appropriate the past medical, family and social history today:     Medical History/Surgical History/Family History       Past Medical History:   Diagnosis Date    Asthma      Centrilobular emphysema      Chronic obstructive airway disease      Chronic respiratory failure with hypoxia (CMS HCC)      Hypertension      Hypothyroidism      OSA (obstructive sleep apnea)      SOB (shortness of breath)      Type 2 diabetes mellitus                    Past Surgical History:   Procedure Laterality Date    HX PELVIC LAPAROSCOPY        HX TONSILLECTOMY        HX TUBAL LIGATION        LEG SURGERY         2-3 surgeries    NECK SURGERY        TUBOPLASTY / TUBOTUBAL ANASTOMOSIS        ULNAR NERVE REPAIR Bilateral              Family Medical History:            Problem Relation (Age of Onset)     Arthritis-osteo Mother, Father     Asthma Mother     Blood Clots Father     Congestive Heart Failure Father     Coronary Artery Disease Father     Heart Attack Father     High Cholesterol Father     Hypertension (High Blood Pressure) Mother, Father     Migraines Mother     Sleep disorders Father     Thyroid  Disease Mother                         Menstrual History    Total time span for birth control pills        Use of HRT (how long total use)        Use of Infertility Treatments        Age of Menarche        Avg.  frequency (days)        Duration (days)        Flow        Pattern        Age of Menopause                             OB History   Gravida Para Term Preterm AB Living    2 2 2      2  SAB IAB Ectopic Multiple Live Births                           # Outcome Date GA Lbr Len/2nd Weight Sex Type Anes PTL Lv   2 Term                     1 Term                        Social history on file   Social History            Socioeconomic History    Marital status: Widowed   Tobacco Use    Smoking status: Former       Current packs/day: 0.00       Average packs/day: 1.3 packs/day for 40.0 years (50.0 ttl pk-yrs)       Types: Cigarettes       Start date: 89       Quit date: 2015       Years since quitting: 10.7    Smokeless tobacco: Never   Vaping Use    Vaping status: Never Used   Substance and Sexual Activity    Alcohol use: Never    Drug use: Never    Sexual activity: Not Currently       Birth control/protection: Post-menopausal      Social Determinants of Health           Financial Resource Strain: Low Risk (11/24/2023)     Financial Resource Strain      SDOH Financial: No   Transportation Needs: Low Risk (11/24/2023)     Transportation Needs      SDOH Transportation: No   Social Connections: Low Risk (11/24/2023)     Social Connections      SDOH Social Isolation: 5 or more times a week   Intimate Partner Violence: Low Risk (11/24/2023)     Intimate Partner Violence      SDOH Domestic Violence: No   Housing Stability: Low Risk (11/24/2023)     Housing Stability      SDOH Housing Situation: I have housing.      SDOH Housing Worry: No         Expanded Substance History         Allergies:  [Allergies]    [Allergies]       Allergen Reactions    Pseudoephedrine Hcl Hives/ Urticaria    Doxylamin-Pse-Dm-Acetaminophen   Other Adverse Reaction (Add comment), Hives/ Urticaria and Rash    Clindamycin Phosphate      Guaifenesin   Other Adverse Reaction (Add comment)    Cleocin [Clindamycin] Hives/ Urticaria    Tessalon [Benzonatate] Hives/  Urticaria     Medications:       Current Outpatient Medications   Medication Sig    albuterol  sulfate (PROVENTIL  OR VENTOLIN  OR PROAIR ) 90 mcg/actuation Inhalation oral inhaler Take 1-2 Puffs by inhalation Every 4 hours as needed    albuterol  sulfate (PROVENTIL ) 2.5 mg /3 mL (0.083 %) Inhalation nebulizer solution Take 3 mL (2.5 mg total) by nebulization Every 4 hours as needed for Wheezing    amitriptyline  (ELAVIL ) 25 mg Oral Tablet Take 1 Tablet (25 mg total) by mouth Every night    aspirin 81 mg Oral Tablet, Chewable Chew 1 Tablet (81 mg total) Daily    baclofen  (LIORESAL ) 10 mg Oral Tablet Take 1 Tablet (10 mg total) by mouth Four  times a day Indications: muscle spasms caused by a spinal disease    Blood Sugar Diagnostic (ACCU-CHEK GUIDE TEST STRIPS) Does not apply Strip 1 Strip Four times a day    Blood-Glucose Meter Misc 4 times per day    budesonide -formoteroL  (SYMBICORT ) 160-4.5 mcg/actuation Inhalation oral inhaler Take 2 Puffs by inhalation Twice daily    calcium carbonate-vitamin D3 (CALCIUM 600 WITH VITAMIN D3) 600 mg-12.5 mcg (500 unit) Oral Capsule Take by mouth    cefuroxime (CEFTIN) 500 mg Oral Tablet Take 1 Tablet (500 mg total) by mouth Twice daily    cholecalciferol, Vitamin D3, 125 mcg (5,000 unit) Oral Tablet Take by oral route.    clobetasoL  (TEMOVATE ) 0.05 % Ointment APPLY A PEA SIZED AMOUNT TO GLUTEAL CLEFT EVERY MONDAY AND THURSDAY AS DIRECTED    cyanocobalamin (VITAMIN B 12) 1,000 mcg Oral Tablet Take 1 Tablet (1,000 mcg total) by mouth Daily    esomeprazole  magnesium  (NEXIUM ) 40 mg Oral Capsule, Delayed Release(E.C.) TAKE 1 CAPSULE BY MOUTH IN THE MORNING BEFORE BREAKFAST    [START ON 04/19/2024] estradiol  (VAGIFEM ) vaginal tablet Insert 1 Tablet (10 mcg total) into the vagina Every MON and THURS    folic acid (FOLVITE) 1 mg Oral Tablet Take 1 Tablet (1 mg total) by mouth Daily    gabapentin  (NEURONTIN ) 600 mg Oral Tablet Take 1 Tablet (600 mg total) by mouth Three times a day     hydroCHLOROthiazide  (HYDRODIURIL ) 25 mg Oral Tablet Take 1 Tablet (25 mg total) by mouth Daily    ketoconazole  (NIZORAL ) 2 % Shampoo USE 1/2 TO 1 (ONE-HALF TO ONE) OUNCE OF SHAMPOO TOPICALLY THREE TIMES A WEEK    Lactobac no.41/Bifidobact no.7 (PROBIOTIC-10 ORAL) Take by mouth Once a day    Lancets (ACCU-CHEK SOFTCLIX LANCETS) Misc 1 Each Four times a day    levothyroxine  (SYNTHROID ) 175 mcg Oral Tablet Take 1 Tablet (175 mcg total) by mouth Every morning    lisinopriL  (PRINIVIL ) 20 mg Oral Tablet TAKE 1 TABLET(20 MG) BY MOUTH DAILY    magnesium  Oxide 420 mg Oral Tablet Take 1 Tablet (420 mg total) by mouth Daily    meloxicam  (MOBIC ) 15 mg Oral Tablet Take 1 Tablet (15 mg total) by mouth Once per day as needed for Pain    MILK THISTLE ORAL Take by mouth    Norethindrone , Contraceptive, (equiv to: ORTHO MICRONOR ) 0.35 mg Oral tablet Take 1 Tablet (0.35 mg total) by mouth Daily    nystatin  (NYSTOP ) 100,000 unit/gram Powder by Apply Topically route Three times a day as needed    pediatric multivitamins Oral Tablet, Chewable Chew 1 Tablet Daily    pravastatin  (PRAVACHOL ) 10 mg Oral Tablet Take 1 Tablet (10 mg total) by mouth Every evening    sertraline  (ZOLOFT ) 100 mg Oral Tablet Take 1 Tablet (100 mg total) by mouth Daily    tiotropium bromide  (SPIRIVA  HANDIHALER) 18 mcg Inhalation Capsule, w/Inhalation Device Inhale 1 Capsule (18 mcg total) by inhalation via handi-haler Daily    tirzepatide  (MOUNJARO ) 12.5 mg/0.5 mL Subcutaneous Pen Injector Inject 0.5 mL (12.5 mg total) under the skin Every 7 days    tolterodine (DETROL LA) 4 mg Oral Capsule, Sust. Release 24 hr      triamcinolone acetonide 0.1 % Cream Apply topically    turmeric/turmeric ext/pepr ext (TURMERIC-TURMERIC EXT-PEPPER) 500-3 mg Oral Capsule Take by mouth    vitamin E 400 unit Oral Capsule Take 1 Capsule (400 Units total) by mouth Daily    VITAMIN E ORAL Take by  mouth      Immunization History:       Immunization History   Administered Date(s)  Administered    Covid-19 Vaccine,Moderna,12 Years+ 08/05/2020    Covid-19 Vaccine,Pfizer-BioNTech,Purple Top,53yrs+ 09/22/2019, 10/13/2019    DIPTH,PERTUSSIS-ACEL,TETANUS >10 YRS OLD 06/13/2012    Flu Family 05/28/2013    High-Dose Influenza Vaccine, 65+ 06/01/2011    Influenza Vaccine, 6 month-adult 06/05/2012, 05/21/2014, 05/28/2015, 05/17/2017, 05/14/2019, 06/01/2023    Tetanus Toxoid/Diphtheria Toxoid/Acellular Pertussis Vaccine, Adsorbed 10/02/2023      Problem List:       Patient Active Problem List     Diagnosis Date Noted    Rectocele 04/12/2024    Pelvic pain 04/12/2024    Hepatic cirrhosis, unspecified hepatic cirrhosis type, unspecified whether ascites present (CMS HCC) 06/01/2023    Diabetic polyneuropathy associated with type 2 diabetes mellitus (CMS HCC) 05/01/2023    Obesity 03/10/2023    Benign neoplasm of ascending colon 12/02/2022    Type 2 diabetes mellitus with circulatory disorder 12/02/2022    Polyp of sigmoid colon 12/02/2022    Stenosis of carotid artery 12/02/2022    Left hip pain 11/15/2022    Varicose veins of both lower extremities 09/03/2022    Depression screening negative 03/02/2022    Osteoarthritis of left shoulder 02/26/2022    Family history of kidney cancer 10/21/2021    PMB (postmenopausal bleeding) 04/01/2021    Full thickness rotator cuff tear 01/09/2021    Abdominal aortic atherosclerosis (CMS HCC) 07/09/2020    Mixed stress and urge urinary incontinence 02/28/2020    Essential hypertension 02/20/2020    Hypothyroidism due to Hashimoto's thyroiditis 02/20/2020    Mild intermittent asthma without complication 02/20/2020    Gastroesophageal reflux disease without esophagitis 02/20/2020    Mixed hyperlipidemia 02/20/2020    Primary insomnia 02/20/2020    OSA (obstructive sleep apnea) 03/07/2018    Carpal tunnel syndrome, bilateral 11/22/2017    Chronic neck pain 01/24/2017    Osteoarthritis of multiple joints 10/12/2016    Family history of vitamin B12 deficiency 10/01/2016     GAD (generalized anxiety disorder) 10/01/2016    Arthropathy 06/16/2016    History of colonic polyps 06/16/2016    Rectal polyp 06/16/2016    Allergic rhinitis, seasonal 02/09/2016    At risk for injury 02/09/2016    Compulsive tobacco user syndrome 02/09/2016    FOM (frequency of micturition) 02/09/2016    RLS (restless legs syndrome) 02/09/2016    Centriacinar emphysema (CMS HCC) 05/28/2015    Chronic fatigue 05/28/2015    Neuropathy (CMS HCC) 05/28/2015    Incomplete emptying of bladder 11/11/2014    Fixation hardware in lower extremity 04/23/2014    History of restless legs syndrome 04/22/2014    Ex-smoker 08/13/2013    Incomplete uterovaginal prolapse 06/08/2011    Accessory skin tags 06/01/2011         Review of Systems:  ROS neg except as mentioned in HPI.     Objective   Vitals:      Vitals:     04/12/24 1258   BP: 110/66   Pulse: 85   Resp: 18   Temp: 36.2 C (97.1 F)   SpO2: 95%   Weight: 79.1 kg (174 lb 6.4 oz)   Height: 1.626 m (5' 4)   BMI: 29.94      Height: 162.6 cm (5' 4)      Wt Readings from Last 5 Encounters:   04/12/24 78.9 kg (174 lb)   04/12/24 79.1 kg (174 lb  6.4 oz)   04/09/24 78.9 kg (174 lb)   04/05/24 78.9 kg (174 lb)   02/28/24 83 kg (183 lb)            Physical Exam:  General Appearance: appears in good health, comfortable, appears stated age, no distress, and vital signs reviewed  Head: NCAT  Eyes: lids and lashes normal, conjunctivae and sclerae normal and pupils equal, EOM intact  Skin: Skin warm and dry and No rashes  Musculoskeletal: inspection is normal  Psychiatric: Normal affect, behavior, memory, thought content, judgement, and speech.  Neurology: normal without focal findings  mental status, speech normal, alert and oriented x iii     Pelvic: Vulva without lesions, pink, NT              Urethra without lesions, NT              Bladder NT  Vaginal pink, atrophic, no lesions, NT, + G3 rectocele with standing valsalva   Cervix smooth, no lesions, no d/c, NT, pink   Uterus  normal size, mobile, nontender   Adenexa normal size, NT, no masses  Anus without lesions, good tone  Pelvis without masses  Rectal: Deferred.             Laboratory Studies/Data Reviewed    Nursing Notes:   Gandee, Krista, LPN  89/97/74 8688  Signed   Travel Screening         Question Response     Have you been in contact with someone who was sick? No / Unsure      Do you have any of the following new or worsening symptoms? Joint pain      Have you traveled internationally in the last month? No              Travel History   Travel since 03/13/24    No documented travel since 03/13/24         Krista Gandee, LPN              Results for orders placed or performed in visit on 04/17/24 (from the past 4 weeks)   VITAMIN B12     Collection Time: 04/17/24  4:46 PM   Result Value Ref Range     VITAMIN B 12  1,725 (H) 200 - 900 pg/mL   CBC/DIFF     Collection Time: 04/17/24  4:46 PM     Narrative     The following orders were created for panel order CBC/DIFF.  Procedure                               Abnormality         Status                     ---------                               -----------         ------                     CBC WITH IPQQ[[id number]]                Abnormal            Final result  Please view results for these tests on the individual orders.   CBC WITH DIFF     Collection Time: 04/17/24  4:46 PM   Result Value Ref Range     WBC 4.9 3.7 - 11.0 x103/uL     RBC 3.90 3.85 - 5.22 x106/uL     HGB 10.6 (L) 11.5 - 16.0 g/dL     HCT 67.1 (L) 65.1 - 46.0 %     MCV 84.1 78.0 - 100.0 fL     MCH 27.2 26.0 - 32.0 pg     MCHC 32.3 31.0 - 35.5 g/dL     RDW-CV 85.1 88.4 - 15.5 %     PLATELETS 112 (L) 150 - 400 x103/uL     MPV 11.5 8.7 - 12.5 fL     NEUTROPHIL % 55.8 %     LYMPHOCYTE % 27.1 %     MONOCYTE % 7.9 %     EOSINOPHIL % 8.4 %     BASOPHIL % 0.4 %     NEUTROPHIL # 2.74 1.50 - 7.70 x103/uL     LYMPHOCYTE # 1.33 1.00 - 4.80 x103/uL     MONOCYTE # 0.39 0.20 - 1.10 x103/uL     EOSINOPHIL #  0.41 <=0.50 x103/uL     BASOPHIL # <0.10 <=0.20 x103/uL     IMMATURE GRANULOCYTE % 0.4 0.0 - 1.0 %     IMMATURE GRANULOCYTE # <0.10 <0.10 x103/uL   Results for orders placed or performed in visit on 03/25/24 (from the past 4 weeks)   RETICULOCYTE COUNT     Collection Time: 03/25/24  9:29 PM   Result Value Ref Range     RETICULOCYTE % AUTOMATED 1.53 0.50 - 2.20 %     RETICULOCYTES COUNT # AUTOMATED 57.5 17.0 - 100.0 x103/uL     IMMATURE RETIC FRACTION 8.0 2.7 - 15.9 %     RETICULOCYTE HEMOGLOBIN EQUIVALENT 29.6 28.0 - 38.0 pg     Narrative     Both reticulocyte hemoglobin content(RET-He) and immature reticulocyte fraction (IRF) quantify circulating immature red blood cells (RBC). They are used to evaluate anemic patients who may have insufficient marrow production (lowerRET-He & IRF) or rapid peripheral destruction of RBC (higher RET-He &IRF).            No results found for this or any previous visit (from the past 720 hours).      Assessment/Plan   Diagnosis      ICD-10-CM     1. PMB (postmenopausal bleeding)  N95.0 CASE REQUEST SURGICAL: ROBOTIC HYSTERECTOMY, SALPINGECTOMY BILATERAL, REPAIR POSTERIOR       2. Preop cardiovascular exam  Z01.810 XR CHEST AP       PT/INR       PTT (PARTIAL THROMBOPLASTIN TIME)       GLUCOSE FASTING       3. Rectocele  N81.6 CASE REQUEST SURGICAL: ROBOTIC HYSTERECTOMY, SALPINGECTOMY BILATERAL, REPAIR POSTERIOR       4. Pelvic pressure in female  R10.20 CASE REQUEST SURGICAL: ROBOTIC HYSTERECTOMY, SALPINGECTOMY BILATERAL, REPAIR POSTERIOR       5. Pelvic pain  R10.20         6. Type 2 diabetes mellitus with other diabetic kidney complication  E11.29 PT/INR       7. 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 Type 2 diabetes mellitus without complication, with long-term current use of insulin   E11.9 GLUCOSE  FASTING     Z79.4               Plan  RATLH, bilateral salpingectomy, posterior repair     Return for  preop.        Sherryle FORBES Risser, DO             Electronically signed by Risser Sherryle FORBES, DO at 04/18/24 [phone]

## 2024-05-25 ENCOUNTER — Ambulatory Visit (INDEPENDENT_AMBULATORY_CARE_PROVIDER_SITE_OTHER): Payer: Self-pay | Admitting: FAMILY MEDICINE

## 2024-05-25 LAB — BASIC METABOLIC PANEL
ANION GAP: 4 mmol/L (ref 4–13)
BUN/CREA RATIO: 24 — ABNORMAL HIGH (ref 6–22)
BUN: 23 mg/dL (ref 8–25)
CALCIUM: 8.7 mg/dL (ref 8.6–10.3)
CHLORIDE: 110 mmol/L (ref 96–111)
CO2 TOTAL: 27 mmol/L (ref 23–31)
CREATININE: 0.96 mg/dL (ref 0.60–1.05)
GLUCOSE: 134 mg/dL — ABNORMAL HIGH (ref 65–125)
POTASSIUM: 4 mmol/L (ref 3.5–5.1)
SODIUM: 141 mmol/L (ref 136–145)
eGFRcr - FEMALE: 66 mL/min/1.73mˆ2 (ref >=60)

## 2024-05-25 LAB — CBC WITH DIFF
BASOPHIL #: <0.1 x10ˆ3/uL (ref <=0.20)
BASOPHIL %: 0.2 %
EOSINOPHIL #: <0.1 x10ˆ3/uL (ref <=0.50)
EOSINOPHIL %: 1.8 %
HCT: 26.2 % — ABNORMAL LOW (ref 34.8–46.0)
HGB: 8.4 g/dL — ABNORMAL LOW (ref 11.5–16.0)
IMMATURE GRANULOCYTE #: <0.1 x10ˆ3/uL (ref <0.10)
IMMATURE GRANULOCYTE %: 0.6 % (ref 0.0–1.0)
LYMPHOCYTE #: 0.93 x10ˆ3/uL — ABNORMAL LOW (ref 1.00–4.80)
LYMPHOCYTE %: 18.1 %
MCH: 27.3 pg (ref 26.0–32.0)
MCHC: 32.1 g/dL (ref 31.0–35.5)
MCV: 85.1 fL (ref 78.0–100.0)
MONOCYTE #: 0.34 x10ˆ3/uL (ref 0.20–1.10)
MONOCYTE %: 6.6 %
MPV: 11.1 fL (ref 8.7–12.5)
NEUTROPHIL #: 3.74 x10ˆ3/uL (ref 1.50–7.70)
NEUTROPHIL %: 72.7 %
PLATELETS: 96 x10ˆ3/uL — ABNORMAL LOW (ref 150–400)
RBC: 3.08 x10ˆ6/uL — ABNORMAL LOW (ref 3.85–5.22)
RDW-CV: 15.2 % (ref 11.5–15.5)
WBC: 5.1 x10ˆ3/uL (ref 3.7–11.0)

## 2024-05-25 LAB — POC BLOOD GLUCOSE (RESULTS)
GLUCOSE, POC: 135 mg/dL — ABNORMAL HIGH (ref 60–110)
GLUCOSE, POC: 127 mg/dL — ABNORMAL HIGH (ref 60–110)

## 2024-05-25 MED ORDER — SENNOSIDES 8.6 MG-DOCUSATE SODIUM 50 MG TABLET
1.0000 | ORAL_TABLET | Freq: Two times a day (BID) | ORAL | 2 refills | Status: AC
Start: 2024-05-25 — End: ?

## 2024-05-25 MED ORDER — TRAMADOL 50 MG TABLET
1.0000 | ORAL_TABLET | Freq: Three times a day (TID) | ORAL | 0 refills | Status: DC | PRN
Start: 2024-05-25 — End: 2024-06-05

## 2024-05-25 NOTE — Nurses Notes (Signed)
 Pt ambulated entirety of med/surg floor with x1 assist of staff member and walker. Pt tolerated activity, no c/c noted. Vaginal bleeding noted with vaginal packing saturated with bright red blood.

## 2024-05-28 ENCOUNTER — Telehealth (INDEPENDENT_AMBULATORY_CARE_PROVIDER_SITE_OTHER): Payer: Self-pay | Admitting: PULMONARY DISEASE

## 2024-05-28 ENCOUNTER — Telehealth (HOSPITAL_COMMUNITY): Payer: Self-pay | Admitting: FAMILY MEDICINE

## 2024-05-28 NOTE — Telephone Encounter (Signed)
 Called patient to discuss xray results. She reports she is still wanting to come in for her appointment on 11/24. Call routed to scheduling to get patient appointment scheduled.

## 2024-05-28 NOTE — Telephone Encounter (Signed)
 Called pt to schedule CXR and Ret. Image appt. Pt said that she just had surgery on 11/13 and they took a CXR so she does not think she needs another one. Can her records be checked to see if she needs the CXR for her appt on 11/24? Thank you!

## 2024-05-28 NOTE — Telephone Encounter (Signed)
 CXR from 05/22/24 was normal please advise

## 2024-05-28 NOTE — Telephone Encounter (Signed)
 Transition of Care Contact Information  Discharge Date: 05/25/2024  Transition Facility Type--Hospital (Inpatient or Observation)  Facility [facility]  Interactive Contact(s): Completed or attempted contact indicated by Date/Time  First Attempt Call: 05/28/2024  9:58 AM  Second Attempted Contact: 05/28/2024 10:02 AM  Contact Method(s)-- Patient/Caregiver Telephone, MyChart Patient Portal  Clinical Staff Name/Role who Alyce Satterfield, RN  Transition Note:Unable to leave VM, MyChart message sent.      Logan Satterfield, RN

## 2024-05-28 NOTE — Telephone Encounter (Signed)
 Pt called and said she is supposed to have an appointment on 06/04/24 with you to get XRAY results that she said she got done at [facility], but she just had surgery and won't be able to make it in. She would like to get the results over the phone once images are reviewed if able.     Please advise!

## 2024-05-28 NOTE — Telephone Encounter (Signed)
 Holding for cancellation in [place]

## 2024-05-28 NOTE — Telephone Encounter (Addendum)
 She had a hysterectomy so please schedule sometime in the next 2-3 weeks

## 2024-05-28 NOTE — Telephone Encounter (Signed)
 Pt was scheduled for 11/25 but cancelled her appt. Notes say that she just had surgery and cannot come in. When would you like scheduling to attempt to get pt in due to recently having surgery?

## 2024-05-29 ENCOUNTER — Telehealth (INDEPENDENT_AMBULATORY_CARE_PROVIDER_SITE_OTHER): Payer: Self-pay | Admitting: FAMILY MEDICINE

## 2024-05-29 NOTE — Telephone Encounter (Signed)
 Pt called in stating that she has a Hysterectomy done last week and stated that her liver is bad. She wants to know if you can refer her to see Dr Seldon Marty Hock, Gastro for this.    Johnye Lovings, CMA

## 2024-05-30 DIAGNOSIS — N950 Postmenopausal bleeding: Secondary | ICD-10-CM

## 2024-05-30 DIAGNOSIS — R102 Pelvic and perineal pain unspecified side: Secondary | ICD-10-CM

## 2024-05-30 DIAGNOSIS — N816 Rectocele: Secondary | ICD-10-CM

## 2024-05-30 LAB — SURGICAL PATHOLOGY SPECIMEN

## 2024-05-31 ENCOUNTER — Encounter (HOSPITAL_BASED_OUTPATIENT_CLINIC_OR_DEPARTMENT_OTHER): Payer: Self-pay | Admitting: Obstetrics & Gynecology

## 2024-05-31 ENCOUNTER — Ambulatory Visit (HOSPITAL_COMMUNITY)

## 2024-05-31 ENCOUNTER — Other Ambulatory Visit: Payer: Self-pay

## 2024-05-31 ENCOUNTER — Ambulatory Visit (HOSPITAL_COMMUNITY): Payer: Self-pay | Admitting: Obstetrics & Gynecology

## 2024-05-31 ENCOUNTER — Ambulatory Visit: Payer: Self-pay | Attending: Obstetrics & Gynecology | Admitting: Obstetrics & Gynecology

## 2024-05-31 VITALS — HR 93 | Temp 97.3°F | Resp 18 | Ht 64.0 in | Wt 183.6 lb

## 2024-05-31 DIAGNOSIS — K7689 Other specified diseases of liver: Secondary | ICD-10-CM | POA: Insufficient documentation

## 2024-05-31 DIAGNOSIS — R58 Hemorrhage, not elsewhere classified: Secondary | ICD-10-CM | POA: Insufficient documentation

## 2024-05-31 DIAGNOSIS — K7581 Nonalcoholic steatohepatitis (NASH): Secondary | ICD-10-CM

## 2024-05-31 DIAGNOSIS — T148XXA Other injury of unspecified body region, initial encounter: Secondary | ICD-10-CM

## 2024-05-31 DIAGNOSIS — Z9889 Other specified postprocedural states: Secondary | ICD-10-CM

## 2024-05-31 DIAGNOSIS — R160 Hepatomegaly, not elsewhere classified: Secondary | ICD-10-CM | POA: Insufficient documentation

## 2024-05-31 LAB — CBC
HCT: 29.2 % — ABNORMAL LOW (ref 34.8–46.0)
HGB: 9.3 g/dL — ABNORMAL LOW (ref 11.5–16.0)
MCH: 27.1 pg (ref 26.0–32.0)
MCHC: 31.8 g/dL (ref 31.0–35.5)
MCV: 85.1 fL (ref 78.0–100.0)
MPV: 11.3 fL (ref 8.7–12.5)
PLATELETS: 146 x10ˆ3/uL — ABNORMAL LOW (ref 150–400)
RBC: 3.43 x10ˆ6/uL — ABNORMAL LOW (ref 3.85–5.22)
RDW-CV: 15.3 % (ref 11.5–15.5)
WBC: 5.5 x10ˆ3/uL (ref 3.7–11.0)

## 2024-05-31 LAB — PT/INR
INR: 1.09 (ref 0.94–1.14)
PROTHROMBIN TIME: 12.6 s (ref 10.5–12.7)

## 2024-05-31 NOTE — Nursing Note (Signed)
 Travel Screening       Question Response    Have you been in contact with someone who was sick? No / Unsure     Do you have any of the following new or worsening symptoms? Abdominal pain;Bruising or bleeding     Have you traveled internationally in the last month? No           Travel History   Travel since 04/30/24    No documented travel since 04/30/24       Fayrene Towner Gandee, LPN

## 2024-05-31 NOTE — Progress Notes (Signed)
 MEDICAL OFFICE [facility]ILDING [facility], MEDICAL OFFICE BUILDING 3  [address]  [place] [address]  [phone]    Angela Frey 05/31/2024   [id number] 03-17-1961     Chief Complaint: Post Op (1 wk out hysterectomy)      Subjective: Angela Frey is a 63 y.o. female presenting for post op visit.  Pt dong well. Her sister would like a referral to [employer] DO. He is a NASH specialist.    Medical History   I have reviewed and updated as appropriate the past medical, family and social history today:    Medical History/Surgical History/Family History  Past Medical History:   Diagnosis Date    Asthma     Centrilobular emphysema     Chronic obstructive airway disease     Chronic respiratory failure with hypoxia (CMS HCC)     Hypertension     Hypothyroidism     OSA (obstructive sleep apnea)     SOB (shortness of breath)     Type 2 diabetes mellitus           Past Surgical History:   Procedure Laterality Date    HX PELVIC LAPAROSCOPY      3 or 4    HX TONSILLECTOMY      HX TUBAL LIGATION      LEG SURGERY      2-3 surgeries    NECK SURGERY      TUBOPLASTY / TUBOTUBAL ANASTOMOSIS      ULNAR NERVE REPAIR Left     2 surgeries          Family Medical History:       Problem Relation (Age of Onset)    Arthritis-osteo Mother, Father    Asthma Mother    Blood Clots Father    Congestive Heart Failure Father    Coronary Artery Disease Father    Heart Attack Father    High Cholesterol Father    Hypertension (High Blood Pressure) Mother, Father    Migraines Mother    Sleep disorders Father    Thyroid  Disease Mother             Menstrual History    Total time span for birth control pills      Use of HRT (how long total use)      Use of Infertility Treatments      Age of Menarche      Avg. frequency (days)      Duration (days)      Flow      Pattern      Age of Menopause         OB History   Gravida Para Term Preterm AB Living   2 2 2   2    SAB IAB Ectopic Multiple Live Births             # Outcome Date GA Lbr  Len/2nd Weight Sex Type Anes PTL Lv   2 Term            1 Term              Social History     Socioeconomic History    Marital status: Widowed   Tobacco Use    Smoking status: Former     Current packs/day: 0.00     Average packs/day: 1.3 packs/day for 40.0 years (50.0 ttl pk-yrs)     Types: Cigarettes     Start date: 77     Quit  date: 2015     Years since quitting: 10.9    Smokeless tobacco: Never   Vaping Use    Vaping status: Never Used   Substance and Sexual Activity    Alcohol use: Never    Drug use: Never    Sexual activity: Not Currently     Birth control/protection: Post-menopausal     Social Determinants of Health     Financial Resource Strain: Low Risk (11/24/2023)    Financial Resource Strain     SDOH Financial: No   Transportation Needs: Low Risk (11/24/2023)    Transportation Needs     SDOH Transportation: No   Social Connections: Low Risk (05/24/2024)    Social Connections     SDOH Social Isolation: 5 or more times a week   Intimate Partner Violence: Low Risk (11/24/2023)    Intimate Partner Violence     SDOH Domestic Violence: No   Housing Stability: Low Risk (11/24/2023)    Housing Stability     SDOH Housing Situation: I have housing.     SDOH Housing Worry: No     Expanded Substance History       Allergies:  Allergies[1]  Medications:  Current Outpatient Medications   Medication Sig    albuterol  sulfate (PROVENTIL  OR VENTOLIN  OR PROAIR ) 90 mcg/actuation Inhalation oral inhaler Take 1-2 Puffs by inhalation Every 4 hours as needed    amitriptyline  (ELAVIL ) 25 mg Oral Tablet Take 1 Tablet (25 mg total) by mouth Every night    baclofen  (LIORESAL ) 10 mg Oral Tablet Take 1 Tablet (10 mg total) by mouth Four times a day Indications: muscle spasms caused by a spinal disease    Blood Sugar Diagnostic (ACCU-CHEK GUIDE TEST STRIPS) Does not apply Strip 1 Strip Four times a day    Blood-Glucose Meter Misc 4 times per day    budesonide -formoteroL  (SYMBICORT ) 160-4.5 mcg/actuation Inhalation oral inhaler Take 2  Puffs by inhalation Twice daily    calcium carbonate-vitamin D3 (CALCIUM 600 WITH VITAMIN D3) 600 mg-12.5 mcg (500 unit) Oral Capsule Take by mouth    cholecalciferol, Vitamin D3, 125 mcg (5,000 unit) Oral Tablet Take by oral route. (Patient not taking: Reported on 06/05/2024)    esomeprazole  magnesium  (NEXIUM ) 40 mg Oral Capsule, Delayed Release(E.C.) TAKE 1 CAPSULE BY MOUTH IN THE MORNING BEFORE BREAKFAST    folic acid (FOLVITE) 1 mg Oral Tablet Take 1 Tablet (1 mg total) by mouth Daily (Patient not taking: Reported on 06/05/2024)    gabapentin  (NEURONTIN ) 600 mg Oral Tablet Take 1 Tablet (600 mg total) by mouth Three times a day    hydroCHLOROthiazide  (HYDRODIURIL ) 25 mg Oral Tablet TAKE 1 TABLET(25 MG) BY MOUTH DAILY    Lactobac no.41/Bifidobact no.7 (PROBIOTIC-10 ORAL) Take by mouth Once a day    Lancets (ACCU-CHEK SOFTCLIX LANCETS) Misc 1 Each Four times a day    levothyroxine  (SYNTHROID ) 175 mcg Oral Tablet TAKE 1 TABLET(175 MCG) BY MOUTH EVERY MORNING    lisinopriL  (PRINIVIL ) 20 mg Oral Tablet Take 1 Tablet (20 mg total) by mouth Daily    magnesium  Oxide 420 mg Oral Tablet Take 1 Tablet (420 mg total) by mouth Daily (Patient not taking: Reported on 06/05/2024)    meloxicam  (MOBIC ) 15 mg Oral Tablet Take 1 Tablet (15 mg total) by mouth Once per day as needed for Pain (Patient not taking: Reported on 06/05/2024)    MILK THISTLE ORAL Take by mouth    multivit-min-iron-FA-vit K-lut (CENTRUM SILVER WOMEN) 8 mg iron-400 mcg-50 mcg Oral  Tablet Take 1 Tablet by mouth Once a day    Non-Formulary/Special Preparation (MEDICATION HELP) Trace minerals 1 tablet daily orally (Patient not taking: Reported on 06/05/2024)    nystatin  (NYSTOP ) 100,000 unit/gram Powder by Apply Topically route Three times a day as needed    PHOSPHATIDYL SERINE-HERB ORAL Take 300 mg by mouth Twice daily    pravastatin  (PRAVACHOL ) 10 mg Oral Tablet Take 1 Tablet (10 mg total) by mouth Every evening    sennosides-docusate sodium  (SENOKOT-S) 8.6-50  mg Oral Tablet Take 1 Tablet by mouth Twice daily    sertraline  (ZOLOFT ) 100 mg Oral Tablet Take 1 Tablet (100 mg total) by mouth Daily    tiotropium bromide  (SPIRIVA  HANDIHALER) 18 mcg Inhalation Capsule, w/Inhalation Device Inhale 1 Capsule (18 mcg total) by inhalation via handi-haler Daily    tirzepatide  (MOUNJARO ) 12.5 mg/0.5 mL Subcutaneous Pen Injector Inject 0.5 mL (12.5 mg total) under the skin Every 7 days    tolterodine (DETROL LA) 4 mg Oral Capsule, Sust. Release 24 hr  (Patient not taking: Reported on 06/05/2024)    VITAMIN E ORAL Take 180 mg by mouth Once a day (Patient not taking: Reported on 06/05/2024)     Immunization History:  Immunization History   Administered Date(s) Administered    Covid-19 Vaccine,Moderna,12 Years+ 08/05/2020    Covid-19 Vaccine,Pfizer-BioNTech,Purple Top,47yrs+ 09/22/2019, 10/13/2019    DIPTH,PERTUSSIS-ACEL,TETANUS >10 YRS OLD 06/13/2012    Flu Family 05/28/2013    High-Dose Influenza Vaccine, 65+ (FLUZONE HD) 06/01/2011    Influenza Vaccine, 6 month-adult 06/05/2012, 05/21/2014, 05/28/2015, 05/17/2017, 05/14/2019, 06/01/2023    Tetanus Toxoid/Diphtheria Toxoid/Acellular Pertussis Vaccine, Adsorbed 10/02/2023     Problem List:  Patient Active Problem List    Diagnosis Date Noted    Chest pain 06/11/2024    S/P hysterectomy 05/24/2024    Hepatic cirrhosis, unspecified hepatic cirrhosis type, unspecified whether ascites present (CMS HCC) 06/01/2023    Diabetic polyneuropathy associated with type 2 diabetes mellitus (CMS HCC) 05/01/2023    Obesity 03/10/2023    Benign neoplasm of ascending colon 12/02/2022    Type 2 diabetes mellitus with circulatory disorder 12/02/2022    Polyp of sigmoid colon 12/02/2022    Stenosis of carotid artery 12/02/2022    Left hip pain 11/15/2022    Varicose veins of both lower extremities 09/03/2022    Depression screening negative 03/02/2022    Osteoarthritis of left shoulder 02/26/2022    Family history of kidney cancer 10/21/2021    Full thickness  rotator cuff tear 01/09/2021    Abdominal aortic atherosclerosis (CMS HCC) 07/09/2020    Mixed stress and urge urinary incontinence 02/28/2020    Essential hypertension 02/20/2020    Hypothyroidism due to Hashimoto's thyroiditis 02/20/2020    Mild intermittent asthma without complication 02/20/2020    Gastroesophageal reflux disease without esophagitis 02/20/2020    Mixed hyperlipidemia 02/20/2020    Primary insomnia 02/20/2020    OSA (obstructive sleep apnea) 03/07/2018    Carpal tunnel syndrome, bilateral 11/22/2017    Chronic neck pain 01/24/2017    Osteoarthritis of multiple joints 10/12/2016    Family history of vitamin B12 deficiency 10/01/2016    GAD (generalized anxiety disorder) 10/01/2016    Arthropathy 06/16/2016    History of colonic polyps 06/16/2016    Rectal polyp 06/16/2016    Allergic rhinitis, seasonal 02/09/2016    At risk for injury 02/09/2016    Compulsive tobacco user syndrome 02/09/2016    FOM (frequency of micturition) 02/09/2016    Shortness of breath 02/09/2016  RLS (restless legs syndrome) 02/09/2016    Centriacinar emphysema (CMS HCC) 05/28/2015    Chronic fatigue 05/28/2015    Neuropathy (CMS HCC) 05/28/2015    Incomplete emptying of bladder 11/11/2014    Fixation hardware in lower extremity 04/23/2014    History of restless legs syndrome 04/22/2014    Ex-smoker 08/13/2013    Incomplete uterovaginal prolapse 06/08/2011    Accessory skin tags 06/01/2011       Review of Systems:  ROS neg except as mentioned in HPI.    Objective   Vitals:  Vitals:    05/31/24 1311   Pulse: 93   Resp: 18   Temp: 36.3 C (97.3 F)   SpO2: 93%   Weight: 83.3 kg (183 lb 9.6 oz)   Height: 1.626 m (5' 4)   BMI: 31.51      Height: 162.6 cm (5' 4)  Wt Readings from Last 5 Encounters:   06/05/24 81.2 kg (179 lb)   05/31/24 83.3 kg (183 lb 9.6 oz)   05/24/24 79.1 kg (174 lb 6.4 oz)   05/17/24 84.5 kg (186 lb 3.2 oz)   04/12/24 78.9 kg (174 lb)         Physical Exam:  General Appearance: appears in good health,  comfortable, moderately obese, appears stated age, no distress, and vital signs reviewed  Head: NCAT  Eyes: lids and lashes normal, conjunctivae and sclerae normal and pupils equal, EOM intact  Abdomen: Soft, non-tender, non-distended, no massses or organomegaly  Extremities: No clubbing, cyanosis, or edema. and  No joint effusion.  Skin: Skin warm and dry and No rashes  Musculoskeletal: inspection is normal  Lymphatics: No lymphadenopathy of cervical, axillary or inguinal lymph nodes  Psychiatric: Normal affect, behavior, memory, thought content, judgement, and speech.  Neurology: normal without focal findings  mental status, speech normal, alert and oriented x iii             Laboratory Studies/Data Reviewed    Nursing Notes:   Gandee, Krista, LPN  88/79/74 [id number]  Signed   Travel Screening       Question Response    Have you been in contact with someone who was sick? No / Unsure     Do you have any of the following new or worsening symptoms? Abdominal pain;Bruising or bleeding     Have you traveled internationally in the last month? No           Travel History   Travel since 04/30/24    No documented travel since 04/30/24       Bruno Sayres, LPN         Results for orders placed or performed in visit on 06/13/24 (from the past 4 weeks)   CBC    Collection Time: 06/13/24  8:51 AM   Result Value Ref Range    WBC 8.5 3.7 - 11.0 x10^3/uL    RBC 3.92 3.85 - 5.22 x10^6/uL    HGB 10.7 (L) 11.5 - 16.0 g/dL    HCT 67.2 (L) 65.1 - 46.0 %    MCV 83.4 78.0 - 100.0 fL    MCH 27.3 26.0 - 32.0 pg    MCHC 32.7 31.0 - 35.5 g/dL    RDW-CV 85.0 88.4 - 84.4 %    PLATELETS 162 150 - 400 x10^3/uL    MPV 10.6 8.7 - 12.5 fL   COMPREHENSIVE METABOLIC PANEL, NON-FASTING    Collection Time: 06/13/24  8:51 AM   Result Value Ref Range  SODIUM 140 136 - 145 mmol/L    POTASSIUM 4.5 3.5 - 5.1 mmol/L    CHLORIDE 105 96 - 111 mmol/L    CO2 TOTAL 26 23 - 31 mmol/L    ANION GAP 9 4 - 13 mmol/L    BUN 34 (H) 8 - 25 mg/dL    CREATININE 8.82 (H) 0.60  - 1.05 mg/dL    BUN/CREA RATIO 29 (H) 6 - 22    eGFRcr - FEMALE 52 (L) >=60 mL/min/1.78m^2    ALBUMIN 3.8 3.4 - 4.8 g/dL    CALCIUM 9.9 8.6 - 89.6 mg/dL    GLUCOSE 97 65 - 874 mg/dL    ALKALINE PHOSPHATASE 100 50 - 130 U/L    ALT (SGPT) 11 <31 U/L    AST (SGOT)  28 11 - 34 U/L    BILIRUBIN TOTAL 0.4 0.3 - 1.3 mg/dL    PROTEIN TOTAL 7.5 6.0 - 8.0 g/dL   C-REACTIVE PROTEIN(CRP),INFLAMMATION    Collection Time: 06/13/24  8:51 AM   Result Value Ref Range    CRP INFLAMMATION 21.5 (H) <8.0 mg/L   SEDIMENTATION RATE    Collection Time: 06/13/24  8:51 AM   Result Value Ref Range    ERYTHROCYTE SEDIMENTATION RATE (ESR) 16 0 - 30 mm/hr   BILIRUBIN, CONJUGATED (DIRECT)    Collection Time: 06/13/24  8:51 AM   Result Value Ref Range    BILIRUBIN DIRECT 0.2 0.1 - 0.4 mg/dL   Results for orders placed or performed in visit on 05/31/24 (from the past 4 weeks)   CBC    Collection Time: 05/31/24  3:00 PM   Result Value Ref Range    WBC 5.5 3.7 - 11.0 x10^3/uL    RBC 3.43 (L) 3.85 - 5.22 x10^6/uL    HGB 9.3 (L) 11.5 - 16.0 g/dL    HCT 70.7 (L) 65.1 - 46.0 %    MCV 85.1 78.0 - 100.0 fL    MCH 27.1 26.0 - 32.0 pg    MCHC 31.8 31.0 - 35.5 g/dL    RDW-CV 84.6 88.4 - 84.4 %    PLATELETS 146 (L) 150 - 400 x10^3/uL    MPV 11.3 8.7 - 12.5 fL   PT/INR    Collection Time: 05/31/24  3:00 PM   Result Value Ref Range    PROTHROMBIN TIME 12.6 10.5 - 12.7 seconds    INR 1.09 0.94 - 1.14    Narrative    Coumadin therapy INR range for Conventional Anticoagulation is 2.0 to 3.0 and for Intensive Anticoagulation 2.5 to 3.5.   Results for orders placed or performed during the hospital encounter of 05/24/24 (from the past 4 weeks)   POC BLOOD GLUCOSE (RESULTS)    Collection Time: 05/24/24  7:02 AM   Result Value Ref Range    GLUCOSE, POC 101 60 - 110 mg/dl   SURGICAL PATHOLOGY SPECIMEN    Collection Time: 05/24/24  9:56 AM   Result Value Ref Range    Final Diagnosis       A. UTERUS, CERVIX, FALLOPIAN TUBES, OVARIES, HYSTERECTOMY, BILATERAL  SALPINGO-OOPHORECTOMY  - BENIGN ENDOMETRIUM, NO EVIDENCE OF HYPERPLASIA OR MALIGNANCY  - ENDOMETRIAL POLYP  - LEIOMYOMATA (UP TO 1.3 CM)  - BILATERAL LARGEST WITH PHYSIOLOGIC CHANGE  - CERVIX AND BILATERAL FALLOPIAN TUBES WITHOUT SIGNIFICANT HISTOPATHOLOGIC ABNORMALITIES      Gross Description       A: Uterus with Fallopian Tubes and Ovaries  Received in formalin, labeled with patient identifiers, and designated "  uterus with bilateral fallopian tubes and ovaries,  is a uterus and cervix (51 g, 6.5 cm fundus to cervix, 4 cm cornu to cornu, 3 cm AP diameter, 2 cm cervical diameter, 0.8 cm external os).  A 1.3 cm intramural leiomyoma is identified anteriorly in the lower uterine segment and corpus. A possible 0.5 cm submucosal leiomyoma versus endometrial polyp is identified on the left posterior fundus. The cervix, endometrium, myometrium, and serosa are otherwise unremarkable without invasive lesions concerning for malignancy  The fallopian tubes are unremarkable and the fimbriae are full and lush. The ovaries surface and cut sections are unremarkable with no mass lesions concerning for malignancy.  The right and left ovaries measure 3.3 and 2 cm in greatest dimension respectively.  Representative sections are submitted in 6 cassettes.      Addendum Report       Two small deep leiomyoma are positive for SMA by immunohistochemistry and negative for CD10. Ki67 labeled 0% of neoplastic cells consistent with the diagnosis of benign leiomyoma.     POC BLOOD GLUCOSE (RESULTS)    Collection Time: 05/24/24 12:20 PM   Result Value Ref Range    GLUCOSE, POC 145 (H) 60 - 110 mg/dl   POC BLOOD GLUCOSE (RESULTS)    Collection Time: 05/24/24  4:17 PM   Result Value Ref Range    GLUCOSE, POC 125 (H) 60 - 110 mg/dl   POC BLOOD GLUCOSE (RESULTS)    Collection Time: 05/24/24  8:15 PM   Result Value Ref Range    GLUCOSE, POC 153 (H) 60 - 110 mg/dl   CBC/DIFF - TOMORROW    Collection Time: 05/25/24  5:32 AM    Narrative    The  following orders were created for panel order CBC/DIFF - TOMORROW.  Procedure                               Abnormality         Status                     ---------                               -----------         ------                     CBC WITH DIFF[[id number]]                Abnormal            Final result                 Please view results for these tests on the individual orders.   BASIC METABOLIC PANEL - TOMORROW    Collection Time: 05/25/24  5:32 AM   Result Value Ref Range    SODIUM 141 136 - 145 mmol/L    POTASSIUM 4.0 3.5 - 5.1 mmol/L    CHLORIDE 110 96 - 111 mmol/L    CO2 TOTAL 27 23 - 31 mmol/L    ANION GAP 4 4 - 13 mmol/L    CALCIUM 8.7 8.6 - 10.3 mg/dL    GLUCOSE 865 (H) 65 - 125 mg/dL    BUN 23 8 - 25 mg/dL    CREATININE 9.03 9.39 - 1.05 mg/dL    eGFRcr - FEMALE 66 >=39  mL/min/1.57m^2    BUN/CREA RATIO 24 (H) 6 - 22   CBC WITH DIFF    Collection Time: 05/25/24  5:32 AM   Result Value Ref Range    WBC 5.1 3.7 - 11.0 x10^3/uL    RBC 3.08 (L) 3.85 - 5.22 x10^6/uL    HGB 8.4 (L) 11.5 - 16.0 g/dL    HCT 73.7 (L) 65.1 - 46.0 %    MCV 85.1 78.0 - 100.0 fL    MCH 27.3 26.0 - 32.0 pg    MCHC 32.1 31.0 - 35.5 g/dL    RDW-CV 84.7 88.4 - 84.4 %    PLATELETS 96 (L) 150 - 400 x10^3/uL    MPV 11.1 8.7 - 12.5 fL    NEUTROPHIL % 72.7 %    LYMPHOCYTE % 18.1 %    MONOCYTE % 6.6 %    EOSINOPHIL % 1.8 %    BASOPHIL % 0.2 %    NEUTROPHIL # 3.74 1.50 - 7.70 x10^3/uL    LYMPHOCYTE # 0.93 (L) 1.00 - 4.80 x10^3/uL    MONOCYTE # 0.34 0.20 - 1.10 x10^3/uL    EOSINOPHIL # <0.10 <=0.50 x10^3/uL    BASOPHIL # <0.10 <=0.20 x10^3/uL    IMMATURE GRANULOCYTE % 0.6 0.0 - 1.0 %    IMMATURE GRANULOCYTE # <0.10 <0.10 x10^3/uL   POC BLOOD GLUCOSE (RESULTS)    Collection Time: 05/25/24  5:32 AM   Result Value Ref Range    GLUCOSE, POC 135 (H) 60 - 110 mg/dl   POC BLOOD GLUCOSE (RESULTS)    Collection Time: 05/25/24 11:20 AM   Result Value Ref Range    GLUCOSE, POC 127 (H) 60 - 110 mg/dl   Results for orders placed or performed in visit  on 05/23/24 (from the past 4 weeks)   PT/INR    Collection Time: 05/23/24 12:38 PM   Result Value Ref Range    PROTHROMBIN TIME 13.3 (H) 10.5 - 12.7 seconds    INR 1.15 (H) 0.94 - 1.14    Narrative    Coumadin therapy INR range for Conventional Anticoagulation is 2.0 to 3.0 and for Intensive Anticoagulation 2.5 to 3.5.   PTT (PARTIAL THROMBOPLASTIN TIME)    Collection Time: 05/23/24 12:38 PM   Result Value Ref Range    APTT 32.0 26.5 - 34.6 seconds   CBC    Collection Time: 05/23/24 12:38 PM   Result Value Ref Range    WBC 4.4 3.7 - 11.0 x10^3/uL    RBC 3.72 (L) 3.85 - 5.22 x10^6/uL    HGB 10.1 (L) 11.5 - 16.0 g/dL    HCT 68.1 (L) 65.1 - 46.0 %    MCV 85.5 78.0 - 100.0 fL    MCH 27.2 26.0 - 32.0 pg    MCHC 31.8 31.0 - 35.5 g/dL    RDW-CV 84.5 88.4 - 84.4 %    PLATELETS 114 (L) 150 - 400 x10^3/uL    MPV 11.1 8.7 - 12.5 fL   COMPREHENSIVE METABOLIC PNL, FASTING    Collection Time: 05/23/24 12:38 PM   Result Value Ref Range    SODIUM 141 136 - 145 mmol/L    POTASSIUM 4.3 3.5 - 5.1 mmol/L    CHLORIDE 108 96 - 111 mmol/L    CO2 TOTAL 24 23 - 31 mmol/L    ANION GAP 9 4 - 13 mmol/L    BUN 25 8 - 25 mg/dL    CREATININE 9.12 9.39 - 1.05 mg/dL    BUN/CREA RATIO  29 (H) 6 - 22    eGFRcr - FEMALE 75 >=60 mL/min/1.35m^2    ALBUMIN 3.6 3.4 - 4.8 g/dL    CALCIUM 9.2 8.6 - 89.6 mg/dL    GLUCOSE 825 (H) 70 - 99 mg/dL    ALKALINE PHOSPHATASE 103 50 - 130 U/L    ALT (SGPT) 15 <31 U/L    AST (SGOT)  26 11 - 34 U/L    BILIRUBIN TOTAL 0.3 0.3 - 1.3 mg/dL    PROTEIN TOTAL 6.8 6.0 - 8.0 g/dL   TYPE AND SCREEN    Collection Time: 05/23/24 12:38 PM   Result Value Ref Range    UNITS ORDERED NOT STATED     ABO/RH(D) A NEGATIVE     ANTIBODY SCREEN NEGATIVE     SPECIMEN EXPIRATION DATE 05/26/2024,2359         No results found for this or any previous visit (from the past 720 hours).     Assessment/Plan   Diagnosis    ICD-10-CM    1. Post-operative state  Z98.890       2. NASH (nonalcoholic steatohepatitis)  K75.81 Refer to External Provider       3. Enlarged liver  R16.0 Refer to External Provider      4. Bleeding  R58 CBC     PT/INR      5. Bruising  T14.8XXA CBC     PT/INR      6. Liver dysfunction  K76.89 CBC     PT/INR          Plan  Refer to dr Germaine  Labs ordered  Continue incision care and good glycemic control    Return in about 4 weeks (around 06/28/2024).      Yorley Buch E Danell Vazquez, DO         [1]   Allergies  Allergen Reactions    Pseudoephedrine Hcl Hives/ Urticaria    Doxylamin-Pse-Dm-Acetaminophen   Other Adverse Reaction (Add comment), Hives/ Urticaria and Rash    Clindamycin Phosphate     Guaifenesin   Other Adverse Reaction (Add comment)    Cleocin [Clindamycin] Hives/ Urticaria    Tessalon [Benzonatate] Hives/ Urticaria

## 2024-06-01 NOTE — Telephone Encounter (Signed)
 Patient called acute line to get rescheduled.

## 2024-06-01 NOTE — Telephone Encounter (Signed)
 First attempt to schedule pt, lft vm for her to call due to availability for [facility] appt week of 12/1

## 2024-06-04 ENCOUNTER — Other Ambulatory Visit: Payer: Self-pay

## 2024-06-04 ENCOUNTER — Ambulatory Visit (INDEPENDENT_AMBULATORY_CARE_PROVIDER_SITE_OTHER): Payer: Self-pay

## 2024-06-04 ENCOUNTER — Ambulatory Visit (HOSPITAL_BASED_OUTPATIENT_CLINIC_OR_DEPARTMENT_OTHER): Payer: Self-pay | Admitting: Obstetrics & Gynecology

## 2024-06-04 NOTE — Telephone Encounter (Signed)
 Pt called back, call forwarded to clinic, she is speaking with Lyle L[facility] now.    [facility]

## 2024-06-04 NOTE — Telephone Encounter (Signed)
 When I called to schedule pt was very upset they have been wanting to talk to a nurse at our office and states they've left multi messages everyday with no call back. She said they weren't wanting an appt they were wanting to talk about what was found on recent CXR. I informed he I had openings today and tomorrow I could offer and she could talk to someone then or I could transfer her to acute line. She agreed to schedule follow up tomorrow 11.25.25.

## 2024-06-05 ENCOUNTER — Other Ambulatory Visit: Payer: Self-pay

## 2024-06-05 ENCOUNTER — Encounter (INDEPENDENT_AMBULATORY_CARE_PROVIDER_SITE_OTHER): Payer: Self-pay | Admitting: PULMONARY DISEASE

## 2024-06-05 ENCOUNTER — Ambulatory Visit: Attending: PULMONARY DISEASE | Admitting: PULMONARY DISEASE

## 2024-06-05 VITALS — BP 116/62 | HR 90 | Temp 98.3°F | Resp 16 | Ht 64.0 in | Wt 179.0 lb

## 2024-06-05 DIAGNOSIS — G4733 Obstructive sleep apnea (adult) (pediatric): Secondary | ICD-10-CM | POA: Insufficient documentation

## 2024-06-05 DIAGNOSIS — R1011 Right upper quadrant pain: Secondary | ICD-10-CM

## 2024-06-05 DIAGNOSIS — J4520 Mild intermittent asthma, uncomplicated: Secondary | ICD-10-CM | POA: Insufficient documentation

## 2024-06-05 DIAGNOSIS — J432 Centrilobular emphysema: Secondary | ICD-10-CM | POA: Insufficient documentation

## 2024-06-05 DIAGNOSIS — Z87891 Personal history of nicotine dependence: Secondary | ICD-10-CM | POA: Insufficient documentation

## 2024-06-05 DIAGNOSIS — R079 Chest pain, unspecified: Secondary | ICD-10-CM | POA: Insufficient documentation

## 2024-06-05 NOTE — Nursing Note (Signed)
 Is the patient currently on oxygen therapy? No  What Liter Flow is the patient on? N/A  What DME company does the patient utilize for oxygen therapy? N/A  What is the patient on? n/a  What DME company does the patient utilize for CPAP/BiPAP/Trilogy? N/A  Smoking History:    Tobacco Use History[1]   Have you received a flu shot? No  Have you received a pneumonia shot? No  Has the patient had any tests performed since the last visit? Chest Xray  If so, where were the test(s) performed? [facility]  Has the patient had any recent hospitalizations? Yes  Does the patient have any other associated symptoms or modifying factors? Shortness of breath      Angela Cid, MA         [1]   Social History  Tobacco Use   Smoking Status Former    Current packs/day: 0.00    Average packs/day: 1.3 packs/day for 40.0 years (50.0 ttl pk-yrs)    Types: Cigarettes    Start date: 19    Quit date: 2015    Years since quitting: 10.9   Smokeless Tobacco Never
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
## 2024-06-11 ENCOUNTER — Telehealth (INDEPENDENT_AMBULATORY_CARE_PROVIDER_SITE_OTHER): Payer: Self-pay | Admitting: PULMONARY DISEASE

## 2024-06-11 DIAGNOSIS — R079 Chest pain, unspecified: Secondary | ICD-10-CM | POA: Insufficient documentation

## 2024-06-11 NOTE — Telephone Encounter (Signed)
 This patients V/Q is pre-denial status asking why patient can not have a CTA for the issue. I can either set up a P2P or we can change to CTA and I will get that authorized and scheduled. I have scanned the pre-denial in the chart. Please advise, thanks

## 2024-06-11 NOTE — Telephone Encounter (Signed)
 I will order a CTA of the chest toilet PE then.

## 2024-06-12 ENCOUNTER — Other Ambulatory Visit: Payer: Self-pay

## 2024-06-13 ENCOUNTER — Ambulatory Visit (HOSPITAL_COMMUNITY)

## 2024-06-13 ENCOUNTER — Ambulatory Visit: Admission: RE | Admit: 2024-06-13

## 2024-06-13 ENCOUNTER — Encounter: Payer: Self-pay | Admitting: INTERNAL MEDICINE

## 2024-06-13 DIAGNOSIS — R945 Abnormal results of liver function studies: Secondary | ICD-10-CM

## 2024-06-13 DIAGNOSIS — Z01810 Encounter for preprocedural cardiovascular examination: Secondary | ICD-10-CM

## 2024-06-13 DIAGNOSIS — R079 Chest pain, unspecified: Secondary | ICD-10-CM

## 2024-06-13 DIAGNOSIS — Z794 Long term (current) use of insulin: Secondary | ICD-10-CM

## 2024-06-13 LAB — COMPREHENSIVE METABOLIC PANEL, NON-FASTING
ALBUMIN: 3.8 g/dL (ref 3.4–4.8)
ALKALINE PHOSPHATASE: 100 U/L (ref 50–130)
ALT (SGPT): 11 U/L (ref <31)
ANION GAP: 9 mmol/L (ref 4–13)
AST (SGOT): 28 U/L (ref 11–34)
BILIRUBIN TOTAL: 0.4 mg/dL (ref 0.3–1.3)
BUN/CREA RATIO: 29 — ABNORMAL HIGH (ref 6–22)
BUN: 34 mg/dL — ABNORMAL HIGH (ref 8–25)
CALCIUM: 9.9 mg/dL (ref 8.6–10.3)
CHLORIDE: 105 mmol/L (ref 96–111)
CO2 TOTAL: 26 mmol/L (ref 23–31)
CREATININE: 1.17 mg/dL — ABNORMAL HIGH (ref 0.60–1.05)
GLUCOSE: 97 mg/dL (ref 65–125)
POTASSIUM: 4.5 mmol/L (ref 3.5–5.1)
PROTEIN TOTAL: 7.5 g/dL (ref 6.0–8.0)
SODIUM: 140 mmol/L (ref 136–145)
eGFRcr - FEMALE: 52 mL/min/1.73mˆ2 — ABNORMAL LOW (ref >=60)

## 2024-06-13 LAB — BILIRUBIN, CONJUGATED (DIRECT): BILIRUBIN DIRECT: 0.2 mg/dL (ref 0.1–0.4)

## 2024-06-13 LAB — C-REACTIVE PROTEIN(CRP),INFLAMMATION: CRP INFLAMMATION: 21.5 mg/L — ABNORMAL HIGH (ref <8.0)

## 2024-06-13 LAB — CBC
HCT: 32.7 % — ABNORMAL LOW (ref 34.8–46.0)
HGB: 10.7 g/dL — ABNORMAL LOW (ref 11.5–16.0)
MCH: 27.3 pg (ref 26.0–32.0)
MCHC: 32.7 g/dL (ref 31.0–35.5)
MCV: 83.4 fL (ref 78.0–100.0)
MPV: 10.6 fL (ref 8.7–12.5)
PLATELETS: 162 x10ˆ3/uL (ref 150–400)
RBC: 3.92 x10ˆ6/uL (ref 3.85–5.22)
RDW-CV: 14.9 % (ref 11.5–15.5)
WBC: 8.5 x10ˆ3/uL (ref 3.7–11.0)

## 2024-06-13 LAB — SEDIMENTATION RATE: ERYTHROCYTE SEDIMENTATION RATE (ESR): 16 mm/h (ref 0–30)

## 2024-06-13 MED ORDER — IOPAMIDOL 370 MG IODINE/ML (76 %) INTRAVENOUS SOLUTION
60.0000 mL | INTRAVENOUS | Status: AC
  Administered 2024-06-13: 60 mL via INTRAVENOUS

## 2024-06-14 ENCOUNTER — Ambulatory Visit (INDEPENDENT_AMBULATORY_CARE_PROVIDER_SITE_OTHER)

## 2024-06-15 ENCOUNTER — Other Ambulatory Visit (HOSPITAL_COMMUNITY): Payer: Self-pay | Admitting: INTERNAL MEDICINE

## 2024-06-15 DIAGNOSIS — K7581 Nonalcoholic steatohepatitis (NASH): Secondary | ICD-10-CM

## 2024-06-15 DIAGNOSIS — R109 Unspecified abdominal pain: Secondary | ICD-10-CM

## 2024-06-15 DIAGNOSIS — K7460 Unspecified cirrhosis of liver: Secondary | ICD-10-CM

## 2024-06-18 ENCOUNTER — Ambulatory Visit (INDEPENDENT_AMBULATORY_CARE_PROVIDER_SITE_OTHER): Payer: Self-pay | Admitting: PULMONARY DISEASE

## 2024-06-18 ENCOUNTER — Other Ambulatory Visit: Payer: Self-pay

## 2024-06-18 NOTE — Brief Op Note (Addendum)
 [facility]  BRIEF OPERATIVE NOTE  Patient:  Angela Frey  Age:  63 y.o.  Sex:  female  MRN:  [id number]  CSN:  [id number]  Date of Service:  05/24/2024  Date of Birth:  1960-09-01    Procedure Performed:  Procedure(s):  ROBOTIC HYSTERECTOMY WITH LYSIS OF ADHESIONS  SALPINOOPHERECTOMY BILATERAL  REPAIR POSTERIOR      Preoperative Diagnosis  PMB (postmenopausal bleeding) [N95.0]  Rectocele [N81.6]  Pelvic pain [R10.20]      Postoperative Diagnosis  Same with pelvic adhesions and endometriosis stage 3.       Surgeon Name:    Sherryle Risser, DO      Anesthesia Type:    General      Estimated Blood Loss  50cc    Specimen(s):  Uterus, cervix, bilateral fallopian tubes.    Complications:   None    Disposition: PACU - hemodynamically stable.  Condition: stable    Osborne Serio E Jleigh Striplin, DO

## 2024-06-18 NOTE — Result Encounter Note (Signed)
 Could 70 please call and discuss the results of the CT scan with the patient let them know about the non pulmonary findings.

## 2024-06-18 NOTE — OR Surgeon (Signed)
 Procedure Performed:  ROBOTIC HYSTERECTOMY  SALPINGECTOMY BILATERAL        Preoperative Diagnosis        Postoperative Diagnosis       Attending Surgeon: Sherryle FORBES Risser, DO  Assistant(s):  BJ Filyaw RN     Anesthesia Type: General      Complications:  None.     Estimated Blood Loss: 20 mls     Specimen(s):   uterus, cervix, bilateral tubes and ovaries           Description of Procedure:  The patient was taken to the operating room, prepped and draped in normal sterile fashion and placed in the dorsal lithotomy position.  A speculum was placed in the patient's posterior vagina, the anterior lip of the cervix was grasped with a single toothed tenaculum, the uterus sounded to 9 and a Vcare intrauterine manipulating device was placed.  Moving to the head of the bed and following sterile technique, the umbilical skin was grasped with Allis clamps and injected with 0.25% Marcaine  and an infraumbilical incision was made.  The incision was carried down to the fascia, the fascia was incised in the abdominal cavity was visualized.  The trocar was inserted and secured.  The abdomen was insufflated.  The camera was inserted. No evidence of injury from entry was noted. The patient was placed in gentle Trendelenburg.  The abdominal pelvic cavities were visualized.  The ureters were noted to be equal and peristalsing bilaterally. Three lateral ports were inserted in the usual manner, under direct visualization.      The robot was docked and I moved to the terminal.   There were numerous tiny clear endometrial implants of the posterior pelvis and a few chocolate implants noted.  The posterior cul de sac was covered in adhesions. Adhesiolysis was performed clearing visibility.     The round ligaments were identified, coagulated and transected with the vessel sealer.  The mesosalpinx were coagulated and then transected,  freeing the tubes and ovaries.  The broad ligaments were coagulated and transected moving down towards  the cervix.  The anterior broad ligament was developed, transected and the bladder flap was created. The tissues were skeletonized. The  uterine arteries and veins were coagulated and transected.    The ridge of the cervical cap was identified. Colpotomy was performed with bipolar scissors. The uterus, tubes and ovaries were delivered through the vagina. Tumi bulb was placed in the vagina to achieve penumatic seal.   The cuff was closed with V-Loc barbed stitch. Good hemostasis was noted. Suction irrigation was performed. Good hemostasis was noted. The ureters were peristalsing bilaterally.   The instruments were removed.  The inferior ports were removed under direct visualization.  The patient was flattened out.  The abdomen was deflated and the camera was removed.  The umbilical fascia was identified elevated up with Kocher's and closed with 0 Vicryl in a running fashion.  The skin incisions were closed with 3 0 Vicryl in a running subcutaneous fashion.  Dermabond and Steri-Strips were applied.     Moving to the bottom of the table. The legs were raised in dorsal lithotomy position.     A dilute vasopressin /NS solution was infiltrated under the posterior vaginal mucosa midline and into the perineal body. An inverted triangle of skin / transverse incision was cut in the perineum. The posterior vaginal wall was opened vertically and midline up to the apex of the rectocele. The cut edges were held and splayed laterally with  a series of T-clamps. The open vaginal mucosa was then dissected laterally with a combination of sharp and blunt dissection, exposing the perirectal fascia. The perirectal fascia was then reapproximated with interrupted 0 Vicryl sutures to draw the lateral folds together and tuck the rectocele back. Deep interrupted sutures of 0 Vicryl were used to reapproximate the fibers of the levator ani muscles. The excess vaginal mucosa was trimmed. The posterior vaginal wall was closed with a running locked  0 Vicryl to the hymenal tags. The superficial perineal muscles were closed with running unlocked 0 Vicryl and the perineal skin was closed with running subcuticular 0 Vicryl. Good hemostasis was noted.   The vagina was packed with 2gm EE cream and 4inch Kerlix.   Pt tol procedure well. Was transported to PACU in stable condition.      The hardware was removed from the vagina.  Patient tolerated procedure well.  Uterus size < 250 gm                  Josph Norfleet E Anaid Haney, DO

## 2024-06-19 ENCOUNTER — Telehealth (INDEPENDENT_AMBULATORY_CARE_PROVIDER_SITE_OTHER): Payer: Self-pay | Admitting: NURSE PRACTITIONER

## 2024-06-19 ENCOUNTER — Ambulatory Visit (HOSPITAL_COMMUNITY)

## 2024-06-19 NOTE — Result Encounter Note (Signed)
 Patient notified of CTA results showing no sign of PE but emphysema, cirrhosis, and splenomegaly.  She follows with Dr. Stana but denies known history of liver disease.  Will forward copy of report to Dr. Stana and to PCP, [name].

## 2024-06-19 NOTE — Telephone Encounter (Signed)
 Done

## 2024-06-19 NOTE — Telephone Encounter (Signed)
 Please send copy of CTA report to PCP, Dr. Dorian Dama, and to Dr. Stana, GI.  Thanks!

## 2024-06-20 ENCOUNTER — Telehealth (HOSPITAL_BASED_OUTPATIENT_CLINIC_OR_DEPARTMENT_OTHER): Payer: Self-pay | Admitting: Obstetrics & Gynecology

## 2024-06-20 ENCOUNTER — Other Ambulatory Visit: Payer: Self-pay

## 2024-06-20 ENCOUNTER — Ambulatory Visit: Admission: RE | Admit: 2024-06-20 | Discharge: 2024-06-20 | Attending: INTERNAL MEDICINE

## 2024-06-20 DIAGNOSIS — K7460 Unspecified cirrhosis of liver: Secondary | ICD-10-CM | POA: Insufficient documentation

## 2024-06-20 DIAGNOSIS — N390 Urinary tract infection, site not specified: Secondary | ICD-10-CM

## 2024-06-20 DIAGNOSIS — R109 Unspecified abdominal pain: Secondary | ICD-10-CM | POA: Insufficient documentation

## 2024-06-20 DIAGNOSIS — K7581 Nonalcoholic steatohepatitis (NASH): Secondary | ICD-10-CM | POA: Insufficient documentation

## 2024-06-20 NOTE — Telephone Encounter (Signed)
 Pt thinks she has a UTI and would like an order for a urine test. She has an ultrasound appt today 12/10 at 12:00 and would like to do it while she is over there.  Thank you  Wells Perry, PRN [facility]  06/20/2024 11:03

## 2024-06-22 ENCOUNTER — Other Ambulatory Visit (INDEPENDENT_AMBULATORY_CARE_PROVIDER_SITE_OTHER): Payer: Self-pay | Admitting: FAMILY MEDICINE

## 2024-06-22 DIAGNOSIS — E1142 Type 2 diabetes mellitus with diabetic polyneuropathy: Secondary | ICD-10-CM

## 2024-06-29 ENCOUNTER — Other Ambulatory Visit (INDEPENDENT_AMBULATORY_CARE_PROVIDER_SITE_OTHER): Payer: Self-pay | Admitting: FAMILY MEDICINE

## 2024-06-29 DIAGNOSIS — F5101 Primary insomnia: Secondary | ICD-10-CM

## 2024-06-29 DIAGNOSIS — M792 Neuralgia and neuritis, unspecified: Secondary | ICD-10-CM

## 2024-06-29 MED ORDER — AMITRIPTYLINE 25 MG TABLET: 25.0000 mg | ORAL_TABLET | Freq: Every evening | ORAL | 2 refills | Status: AC

## 2024-06-30 ENCOUNTER — Other Ambulatory Visit (INDEPENDENT_AMBULATORY_CARE_PROVIDER_SITE_OTHER): Payer: Self-pay | Admitting: FAMILY MEDICINE

## 2024-06-30 DIAGNOSIS — E782 Mixed hyperlipidemia: Secondary | ICD-10-CM

## 2024-06-30 DIAGNOSIS — E1165 Type 2 diabetes mellitus with hyperglycemia: Secondary | ICD-10-CM

## 2024-07-08 ENCOUNTER — Other Ambulatory Visit: Payer: Self-pay

## 2024-07-09 ENCOUNTER — Ambulatory Visit: Payer: Self-pay | Attending: Obstetrics & Gynecology | Admitting: Obstetrics & Gynecology

## 2024-07-09 ENCOUNTER — Encounter (HOSPITAL_BASED_OUTPATIENT_CLINIC_OR_DEPARTMENT_OTHER): Payer: Self-pay | Admitting: Obstetrics & Gynecology

## 2024-07-09 VITALS — Temp 96.9°F | Ht 64.0 in | Wt 179.0 lb

## 2024-07-09 DIAGNOSIS — Z9889 Other specified postprocedural states: Secondary | ICD-10-CM | POA: Insufficient documentation

## 2024-07-09 MED ORDER — PROGESTERONE MICRONIZED 100 MG CAPSULE: 100.0000 mg | ORAL_CAPSULE | Freq: Every evening | ORAL | 1 refills | Status: DC

## 2024-07-09 NOTE — Progress Notes (Signed)
 MEDICAL OFFICE BUILDING 3 [facility], MEDICAL OFFICE BUILDING 3  [address]  [place] [address]  [id number]    Angela Frey 07/09/2024   [id number] 05/09/1961     Chief Complaint: Post Op (6 wks out hysterectomy)      Subjective:   History of Present Illness  Angela Frey is a 63 year old female who presents for a follow-up visit after a hysterectomy.    Postoperative recovery  - Recovering well following recent hysterectomy with removal of polyp and fibroid. Pathology benign.   - No postoperative bleeding or spotting    Menopausal symptoms  - Prior hot flashes and mood swings have resolved  - Currently using vaginal estradiol     Diabetes mellitus  - Blood sugars are well controlled on Mounjaro   - Not using insulin     Gynecologic surgical history  - History of severe cervical dysplasia as a teenager, treated conservatively  - Cervix, uterus, and ovaries have been removed       Medical History   I have reviewed and updated as appropriate the past medical, family and social history today:    Medical History/Surgical History/Family History  Past Medical History:   Diagnosis Date    Asthma     Centrilobular emphysema     Chronic obstructive airway disease     Chronic respiratory failure with hypoxia (CMS HCC)     Hypertension     Hypothyroidism     OSA (obstructive sleep apnea)     Severe cervical dysplasia     remote    SOB (shortness of breath)     Type 2 diabetes mellitus           Past Surgical History:   Procedure Laterality Date    HX HYSTERECTOMY      HX PELVIC LAPAROSCOPY      3 or 4    HX TONSILLECTOMY      HX TUBAL LIGATION      LEG SURGERY      2-3 surgeries    NECK SURGERY      TUBOPLASTY / TUBOTUBAL ANASTOMOSIS      ULNAR NERVE REPAIR Left     2 surgeries          Family Medical History:       Problem Relation (Age of Onset)    Arthritis-osteo Mother, Father    Asthma Mother    Blood Clots Father    Congestive Heart Failure Father    Coronary Artery Disease Father    Heart Attack  Father    High Cholesterol Father    Hypertension (High Blood Pressure) Mother, Father    Migraines Mother    Sleep disorders Father    Thyroid  Disease Mother             Menstrual History    Total time span for birth control pills      Use of HRT (how long total use)      Use of Infertility Treatments      Age of Menarche      Avg. frequency (days)      Duration (days)      Flow      Pattern      Age of Menopause         OB History   Gravida Para Term Preterm AB Living   2 2 2   2    SAB IAB Ectopic Multiple Live Births             #  Outcome Date GA Lbr Len/2nd Weight Sex Type Anes PTL Lv   2 Term            1 Term              Social History     Socioeconomic History    Marital status: Widowed   Tobacco Use    Smoking status: Former     Current packs/day: 0.00     Average packs/day: 1.3 packs/day for 40.0 years (50.0 ttl pk-yrs)     Types: Cigarettes     Start date: 23     Quit date: 2015     Years since quitting: 11.0    Smokeless tobacco: Never   Vaping Use    Vaping status: Never Used   Substance and Sexual Activity    Alcohol use: Never    Drug use: Never    Sexual activity: Not Currently     Birth control/protection: Post-menopausal     Social Determinants of Health     Financial Resource Strain: Low Risk (11/24/2023)    Financial Resource Strain     SDOH Financial: No   Transportation Needs: Low Risk (11/24/2023)    Transportation Needs     SDOH Transportation: No   Social Connections: Low Risk (05/24/2024)    Social Connections     SDOH Social Isolation: 5 or more times a week   Intimate Partner Violence: Low Risk (11/24/2023)    Intimate Partner Violence     SDOH Domestic Violence: No   Housing Stability: Low Risk (11/24/2023)    Housing Stability     SDOH Housing Situation: I have housing.     SDOH Housing Worry: No     Expanded Substance History       Allergies:  Allergies[1]  Medications:  Current Outpatient Medications   Medication Sig    albuterol  sulfate (PROVENTIL  OR VENTOLIN  OR PROAIR ) 90  mcg/actuation Inhalation oral inhaler Take 1-2 Puffs by inhalation Every 4 hours as needed    amitriptyline  (ELAVIL ) 25 mg Oral Tablet Take 1 Tablet (25 mg total) by mouth Every night    baclofen  (LIORESAL ) 10 mg Oral Tablet Take 1 Tablet (10 mg total) by mouth Four times a day Indications: muscle spasms caused by a spinal disease    Blood Sugar Diagnostic (ACCU-CHEK GUIDE TEST STRIPS) Does not apply Strip 1 Strip Four times a day    Blood-Glucose Meter Misc 4 times per day    budesonide -formoteroL  (SYMBICORT ) 160-4.5 mcg/actuation Inhalation oral inhaler Take 2 Puffs by inhalation Twice daily    calcium carbonate-vitamin D3 (CALCIUM 600 WITH VITAMIN D3) 600 mg-12.5 mcg (500 unit) Oral Capsule Take by mouth    cholecalciferol, Vitamin D3, 125 mcg (5,000 unit) Oral Tablet Take by oral route. (Patient not taking: Reported on 06/05/2024)    esomeprazole  magnesium  (NEXIUM ) 40 mg Oral Capsule, Delayed Release(E.C.) TAKE 1 CAPSULE BY MOUTH IN THE MORNING BEFORE BREAKFAST    folic acid (FOLVITE) 1 mg Oral Tablet Take 1 Tablet (1 mg total) by mouth Daily (Patient not taking: Reported on 06/05/2024)    gabapentin  (NEURONTIN ) 600 mg Oral Tablet Take 1 Tablet (600 mg total) by mouth Three times a day    hydroCHLOROthiazide  (HYDRODIURIL ) 25 mg Oral Tablet TAKE 1 TABLET(25 MG) BY MOUTH DAILY    Lactobac no.41/Bifidobact no.7 (PROBIOTIC-10 ORAL) Take by mouth Once a day    Lancets (ACCU-CHEK SOFTCLIX LANCETS) Misc 1 Each Four times a day    levothyroxine  (SYNTHROID ) 175 mcg Oral  Tablet TAKE 1 TABLET(175 MCG) BY MOUTH EVERY MORNING    lisinopriL  (PRINIVIL ) 20 mg Oral Tablet Take 1 Tablet (20 mg total) by mouth Daily    magnesium  Oxide 420 mg Oral Tablet Take 1 Tablet (420 mg total) by mouth Daily (Patient not taking: Reported on 06/05/2024)    meloxicam  (MOBIC ) 15 mg Oral Tablet Take 1 Tablet (15 mg total) by mouth Once per day as needed for Pain (Patient not taking: Reported on 06/05/2024)    MILK THISTLE ORAL Take by mouth     multivit-min-iron-FA-vit K-lut (CENTRUM SILVER WOMEN) 8 mg iron-400 mcg-50 mcg Oral Tablet Take 1 Tablet by mouth Once a day    Non-Formulary/Special Preparation (MEDICATION HELP) Trace minerals 1 tablet daily orally (Patient not taking: Reported on 06/05/2024)    nystatin  (NYSTOP ) 100,000 unit/gram Powder by Apply Topically route Three times a day as needed    PHOSPHATIDYL SERINE-HERB ORAL Take 300 mg by mouth Twice daily    pravastatin  (PRAVACHOL ) 10 mg Oral Tablet TAKE 1 TABLET(10 MG) BY MOUTH EVERY EVENING    progesterone  micronized (PROMETRIUM ) 100 mg Oral Capsule Take 1 Capsule (100 mg total) by mouth Every night    sennosides-docusate sodium  (SENOKOT-S) 8.6-50 mg Oral Tablet Take 1 Tablet by mouth Twice daily    sertraline  (ZOLOFT ) 100 mg Oral Tablet Take 1 Tablet (100 mg total) by mouth Daily    tiotropium bromide  (SPIRIVA  HANDIHALER) 18 mcg Inhalation Capsule, w/Inhalation Device Inhale 1 Capsule (18 mcg total) by inhalation via handi-haler Daily    tirzepatide  (MOUNJARO ) 12.5 mg/0.5 mL Subcutaneous Pen Injector Inject 0.5 mL (12.5 mg total) under the skin Every 7 days    tolterodine (DETROL LA) 4 mg Oral Capsule, Sust. Release 24 hr  (Patient not taking: Reported on 06/05/2024)    VITAMIN E ORAL Take 180 mg by mouth Once a day (Patient not taking: Reported on 06/05/2024)     Immunization History:  Immunization History   Administered Date(s) Administered    Covid-19 Vaccine,Moderna,12 Years+ 08/05/2020    Covid-19 Vaccine,Pfizer-BioNTech,Purple Top,83yrs+ 09/22/2019, 10/13/2019    DIPTH,PERTUSSIS-ACEL,TETANUS >10 YRS OLD 06/13/2012    Flu Family 05/28/2013    High-Dose Influenza Vaccine, 65+ (FLUZONE HD) 06/01/2011    Influenza Vaccine, 6 month-adult 06/05/2012, 05/21/2014, 05/28/2015, 05/17/2017, 05/14/2019, 06/01/2023    Tetanus Toxoid/Diphtheria Toxoid/Acellular Pertussis Vaccine, Adsorbed 10/02/2023       Review of Systems:  ROS neg except as mentioned in HPI.    Objective   Vitals:  Vitals:    07/09/24  1339   Temp: 36.1 C (96.9 F)   Weight: 81.2 kg (179 lb)   Height: 1.626 m (5' 4)   BMI: 30.73      Height: 162.6 cm (5' 4)  Wt Readings from Last 5 Encounters:   07/09/24 81.2 kg (179 lb)   06/05/24 81.2 kg (179 lb)   05/31/24 83.3 kg (183 lb 9.6 oz)   05/24/24 79.1 kg (174 lb 6.4 oz)   05/17/24 84.5 kg (186 lb 3.2 oz)       Physical Exam:  General Appearance: appears in good health, comfortable, mildly obese, appears stated age, no distress, and vital signs reviewed  Head: NCAT  Eyes: lids and lashes normal, conjunctivae and sclerae normal and pupils equal, EOM intact  Neck: No lymphadenopathy. No thyromegaly or thyroid  mass.  Abdomen: Soft, non-tender, non-distended, no massses or organomegaly  Extremities: No clubbing, cyanosis, or edema. and  No joint effusion.  Skin: Skin warm and dry and No rashes  Musculoskeletal: inspection is normal  Lymphatics: No lymphadenopathy of cervical, axillary or inguinal lymph nodes  Psychiatric: Normal affect, behavior, memory, thought content, judgement, and speech.  Neurology: normal without focal findings  mental status, speech normal, alert and oriented x iii    Abdominal scars well healed.      Pelvic: Vulva without lesions, pink,atrophic,  NT   Urethra without lesions, NT   Bladder NT  Vaginal pink, moist, mod atrophy, no lesions, NT, cuff well healed   Anus without lesions, good tone  Pelvis without masses  Rectal: Deferred.           Laboratory Studies/Data Reviewed   There are no exam notes on file for this visit.       Results for orders placed or performed in visit on 06/13/24 (from the past 4 weeks)   CBC    Collection Time: 06/13/24  8:51 AM   Result Value Ref Range    WBC 8.5 3.7 - 11.0 x103/uL    RBC 3.92 3.85 - 5.22 x106/uL    HGB 10.7 (L) 11.5 - 16.0 g/dL    HCT 67.2 (L) 65.1 - 46.0 %    MCV 83.4 78.0 - 100.0 fL    MCH 27.3 26.0 - 32.0 pg    MCHC 32.7 31.0 - 35.5 g/dL    RDW-CV 85.0 88.4 - 84.4 %    PLATELETS 162 150 - 400 x103/uL    MPV 10.6 8.7 - 12.5 fL    COMPREHENSIVE METABOLIC PANEL, NON-FASTING    Collection Time: 06/13/24  8:51 AM   Result Value Ref Range    SODIUM 140 136 - 145 mmol/L    POTASSIUM 4.5 3.5 - 5.1 mmol/L    CHLORIDE 105 96 - 111 mmol/L    CO2 TOTAL 26 23 - 31 mmol/L    ANION GAP 9 4 - 13 mmol/L    BUN 34 (H) 8 - 25 mg/dL    CREATININE 8.82 (H) 0.60 - 1.05 mg/dL    BUN/CREA RATIO 29 (H) 6 - 22    eGFRcr - FEMALE 52 (L) >=60 mL/min/1.101m2    ALBUMIN 3.8 3.4 - 4.8 g/dL    CALCIUM 9.9 8.6 - 89.6 mg/dL    GLUCOSE 97 65 - 874 mg/dL    ALKALINE PHOSPHATASE 100 50 - 130 U/L    ALT (SGPT) 11 <31 U/L    AST (SGOT)  28 11 - 34 U/L    BILIRUBIN TOTAL 0.4 0.3 - 1.3 mg/dL    PROTEIN TOTAL 7.5 6.0 - 8.0 g/dL   C-REACTIVE PROTEIN(CRP),INFLAMMATION    Collection Time: 06/13/24  8:51 AM   Result Value Ref Range    CRP INFLAMMATION 21.5 (H) <8.0 mg/L   SEDIMENTATION RATE    Collection Time: 06/13/24  8:51 AM   Result Value Ref Range    ERYTHROCYTE SEDIMENTATION RATE (ESR) 16 0 - 30 mm/hr   BILIRUBIN, CONJUGATED (DIRECT)    Collection Time: 06/13/24  8:51 AM   Result Value Ref Range    BILIRUBIN DIRECT 0.2 0.1 - 0.4 mg/dL        No results found for this or any previous visit (from the past 720 hours).     Assessment/Plan       ICD-10-CM    1. Post-operative state  Z98.890          Assessment & Plan  Woman's Wellness Visit  Post-operative state with good healing. Blood sugar levels well-controlled. No current bleeding issues. Discussed pelvic  floor and yoga ball exercises to prevent hernia and bone loss due to diabetes.  - Continue current blood sugar management regimen.  - Perform pelvic floor exercises regularly.  - Use a yoga ball for exercises to prevent bone loss.    Menopausal Hormone Therapy  Discussed benefits and risks of Prometrium  for menopausal symptoms, including estrogen dominance and blood sugar control. Provided educational materials on hormone replacement therapy and bioidentical hormones. Pt requests trial of prometrium .   - Prescribed Prometrium   100 mg at bedtime.  - Provided educational materials on hormone replacement therapy.  - Schedule video follow-up to assess response to Prometrium .    Type 2 Diabetes Mellitus  Blood sugar levels well-controlled. Discussed Mounjaro 's role in insulin  sensitivity and preventing complications.  - Continue current diabetes management regimen, including Mounjaro .    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 Emphasized regular Pap smears due to abnormal history and recurrence risk. Highlighted vaginal cancer risk and screening importance.  - Schedule Pap smears every 2-3 years, or annually if sexually active.  - Continue regular gynecological follow-ups.    Postmenopausal Atrophic Vaginitis  Discussed Vagifem  use to maintain vaginal health and prevent bacterial vaginosis. Emphasized healthy vaginal environment to prevent infections and dysplasia.  - Continue using Vagifem  (vaginal estradiol ) regularly.       Return in about 4 weeks (around 08/06/2024) for f/u on prometrium .    This note was created with assistance from [employer] via capture of conversational audio. Consent was obtained from the patient and all parties present prior to recording.      Delainey Winstanley E Francesa Eugenio, DO         [1]   Allergies  Allergen Reactions    Pseudoephedrine Hcl Hives/ Urticaria    Doxylamin-Pse-Dm-Acetaminophen   Other Adverse Reaction (Add comment), Hives/ Urticaria and Rash    Clindamycin Phosphate     Guaifenesin   Other Adverse Reaction (Add comment)    Cleocin [Clindamycin] Hives/ Urticaria    Tessalon [Benzonatate] Hives/ Urticaria

## 2024-07-13 ENCOUNTER — Other Ambulatory Visit (INDEPENDENT_AMBULATORY_CARE_PROVIDER_SITE_OTHER): Payer: Self-pay

## 2024-07-13 DIAGNOSIS — Z7189 Other specified counseling: Secondary | ICD-10-CM

## 2024-07-13 NOTE — Progress Notes (Signed)
 Reason for Encounter: Medication Adherence Quality Review    I reviewed Atiyana Baruch's chart and medication dispensing report for compliance with Pravastatin  10 mg.  Upon review of the patient's chart and dispensing report, the patient last filled a 90 days supply on 07/02/24.   The patient is adherent to their medication.    Medication Barriers -: NA*    Thank you,    Derick Lee, PHARMD

## 2024-07-26 ENCOUNTER — Other Ambulatory Visit (INDEPENDENT_AMBULATORY_CARE_PROVIDER_SITE_OTHER): Payer: Self-pay | Admitting: FAMILY MEDICINE

## 2024-07-26 DIAGNOSIS — M25552 Pain in left hip: Secondary | ICD-10-CM

## 2024-07-26 DIAGNOSIS — G629 Polyneuropathy, unspecified: Secondary | ICD-10-CM

## 2024-07-26 DIAGNOSIS — I10 Essential (primary) hypertension: Secondary | ICD-10-CM

## 2024-07-27 ENCOUNTER — Other Ambulatory Visit (INDEPENDENT_AMBULATORY_CARE_PROVIDER_SITE_OTHER): Payer: Self-pay | Admitting: NURSE PRACTITIONER

## 2024-07-27 DIAGNOSIS — K219 Gastro-esophageal reflux disease without esophagitis: Secondary | ICD-10-CM

## 2024-07-30 ENCOUNTER — Other Ambulatory Visit (INDEPENDENT_AMBULATORY_CARE_PROVIDER_SITE_OTHER): Payer: Self-pay | Admitting: FAMILY MEDICINE

## 2024-07-30 DIAGNOSIS — F321 Major depressive disorder, single episode, moderate: Secondary | ICD-10-CM

## 2024-07-31 ENCOUNTER — Telehealth (INDEPENDENT_AMBULATORY_CARE_PROVIDER_SITE_OTHER): Payer: Self-pay | Admitting: FAMILY MEDICINE

## 2024-07-31 NOTE — Telephone Encounter (Signed)
 Needing a referral to Dr. Angeline for ortho at [facility]. Pt is also needing an MRI of her right ankle before seeing this provider.

## 2024-08-02 ENCOUNTER — Ambulatory Visit: Attending: FAMILY MEDICINE | Admitting: FAMILY MEDICINE

## 2024-08-02 ENCOUNTER — Ambulatory Visit (INDEPENDENT_AMBULATORY_CARE_PROVIDER_SITE_OTHER): Payer: Self-pay | Admitting: FAMILY MEDICINE

## 2024-08-02 DIAGNOSIS — M545 Low back pain, unspecified: Secondary | ICD-10-CM | POA: Insufficient documentation

## 2024-08-02 DIAGNOSIS — M25571 Pain in right ankle and joints of right foot: Secondary | ICD-10-CM | POA: Insufficient documentation

## 2024-08-02 DIAGNOSIS — G8929 Other chronic pain: Secondary | ICD-10-CM | POA: Insufficient documentation

## 2024-08-02 NOTE — Progress Notes (Signed)
 FAMILY MEDICINE, MEDICAL OFFICE BUILDING 5  [address]  [place] [address]  Operated by [facility]  E-Visit Note      Reason for E-Visit:   Visit Diagnosis with associated orders:    ICD-10-CM    1. Chronic low back pain  M54.50 * MRI ANKLE RIGHT WO CONTRAST    G89.29       2. Right ankle pain  M25.571 * MRI ANKLE RIGHT WO CONTRAST          The patient (or their delegate) initiated a request for e-visit using MyChart patient portal.  See MyChart messages documented in this encounter for details of online digital evaluation and management provided.     I have reviewed your back imaging your back xray results do not warrant a referral to Dr. Thurmon you need to do PT for at least 6 weeks to see if this improves your back pain this is a guideline a the reasonable course of treatment for back pain. After physical therapy and seeing their notes at time I will be able to determine if additional imaging is needed.     I have reviewed the imaging of your ankle. It showed old healed fracture on the distal fibula and chronic arthritis changes.   You would have to see a podiatrist for this I can refer you to one.   I can order the MRI of the right ankle which I will order today   I will have to wait to see if your insurance requires you to go to physical therapy to get it approved.     I personally spent 13 minutes of cumulative time performing this service (within 7 days since e-visit initiated).    Dorian POUR Angela Faro, DO  08/02/2024, 10:37

## 2024-08-03 ENCOUNTER — Telehealth (INDEPENDENT_AMBULATORY_CARE_PROVIDER_SITE_OTHER): Payer: Self-pay | Admitting: FAMILY MEDICINE

## 2024-08-03 NOTE — Telephone Encounter (Signed)
 Pt called and stated she lvm for Angela Frey. She stated Angela Frey had not called her back yet. She stated it was in regards to a ref for Dr. Thurmon. She stated she had already left a message for Angela Frey and hadnt got to speak with her, so she was going to try to make it to the office before we closed.    Comments added by Boyd Barrier on 08/03/2024 at 16:26.

## 2024-08-04 LAB — ECG W INTERP (AMB USE ONLY)(MUSE,IN CLINIC)
Atrial Rate: 88 {beats}/min
Calculated P Axis: 70 degrees
Calculated R Axis: 25 degrees
Calculated T Axis: 56 degrees
PR Interval: 146 ms
QRS Duration: 92 ms
QT Interval: 376 ms
QTC Calculation: 454 ms
Ventricular rate: 88 {beats}/min

## 2024-08-13 ENCOUNTER — Telehealth (INDEPENDENT_AMBULATORY_CARE_PROVIDER_SITE_OTHER): Payer: Self-pay | Admitting: FAMILY MEDICINE

## 2024-08-13 NOTE — Telephone Encounter (Signed)
 Ellaree called in and stated she has a MRI scheduled for 02/05 but it was ordered wrong, it needs to be for a tib and fib.   I told her I would let you know and see if you can change the order. I tried explaining it would more than likely need to be authorized again but she stated it would not. So I let her know I would send you a message about it.    Harlene DELENA Bach, [address] [place]

## 2024-08-15 ENCOUNTER — Other Ambulatory Visit: Payer: Self-pay

## 2024-08-15 ENCOUNTER — Ambulatory Visit: Admitting: Obstetrics & Gynecology

## 2024-08-15 ENCOUNTER — Encounter (HOSPITAL_BASED_OUTPATIENT_CLINIC_OR_DEPARTMENT_OTHER): Payer: Self-pay | Admitting: Obstetrics & Gynecology

## 2024-08-15 VITALS — Ht 64.0 in | Wt 179.0 lb

## 2024-08-15 DIAGNOSIS — K7581 Nonalcoholic steatohepatitis (NASH): Secondary | ICD-10-CM

## 2024-08-15 DIAGNOSIS — Z79890 Hormone replacement therapy (postmenopausal): Secondary | ICD-10-CM

## 2024-08-15 DIAGNOSIS — E559 Vitamin D deficiency, unspecified: Secondary | ICD-10-CM

## 2024-08-15 MED ORDER — PROGESTERONE MICRONIZED 100 MG CAPSULE: 100.0000 mg | ORAL_CAPSULE | Freq: Every evening | ORAL | 2 refills | Status: AC

## 2024-08-15 MED ORDER — PROGESTERONE MICRONIZED 100 MG CAPSULE: 100.0000 mg | ORAL_CAPSULE | Freq: Every evening | ORAL | 1 refills | Status: DC

## 2024-08-15 NOTE — Progress Notes (Signed)
 GYNECOLOGY, MEDICAL OFFICE BUILDING 3  [address]  [place] [address]  Operated by [facility]  Video Visit     Name: Angela Frey  MRN: [id number]    Date: 08/15/2024  Age: 64 y.o.                            Patient's location: Home Angela [place] [address]   Patient/family aware of provider location: Yes  Patient/family consent for video visit: Yes  Interview and observation performed by: Sherryle FORBES Risser, DO    Chief Complaint: Follow Up (Patient presents today virtual MyChart visit to follow up on prometrium .)    History of Present Illness:  History of Present Illness  Angela Frey is a 64 year old female with cirrhosis of the liver who presents for follow-up after a hysterectomy and to discuss ongoing symptoms and medication management.    Postoperative recovery following hysterectomy  - Recovery ongoing after recent hysterectomy.  - Improved overall well-being; required approximately two weeks to feel more normal.  - Experienced abdominal swelling and bruising postoperatively, attributed to thrombocytopenia.  - Transient mental fogginess resolved, returned to baseline mental status.    Sleep disturbance  - Improved sleep quality since initiating Prometrium  at bedtime.  - No other changes noted with Prometrium  use.    Cirrhosis and medication management  - Cirrhosis of the liver present.  - Discontinued vitamin D  and other non-essential medications due to concern for hepatic effects.  - Uncertain of current vitamin D  level.    Gynecologic screening preferences  - History of abnormal Pap smear at age 7.  - Prefers annual Pap smears for reassurance, despite Medicare coverage limitations.    Groin mass  - Palpable, mobile knot in the groin region.  - Plans to seek evaluation if the mass persists.       Past Medical History:  She has a past medical history of Asthma, Centrilobular emphysema, Chronic obstructive airway disease, Chronic respiratory failure with hypoxia (CMS HCC),  Hypertension, Hypothyroidism, OSA (obstructive sleep apnea), Severe cervical dysplasia, SOB (shortness of breath), and Type 2 diabetes mellitus.    Past Surgical History:  She has a past surgical history that includes Leg Surgery; hx tubal ligation; hx tonsillectomy; Tuboplasty / tubotubal anastomosis; Neck surgery; hx pelvic laparoscopy; Ulnar nerve repair (Left); and hx hysterectomy.    Medications:    albuterol  sulfate    amitriptyline     baclofen     Accu-Chek Guide test strips    Blood-Glucose Meter    budesonide -formoterol     calcium carbonate-vitamin D3    esomeprazole  magnesium     gabapentin     hydroCHLOROthiazide     Lactobac no.41/Bifidobact no.7 (PROBIOTIC-10 ORAL)    Lancets    levothyroxine     lisinopril     meloxicam     MILK THISTLE ORAL    Centrum Silver Women    nystatin     PHOSPHATIDYL SERINE-HERB ORAL    pravastatin     progesterone  micronized    sennosides-docusate sodium     sertraline     tiotropium bromide     Mounjaro      Review of Systems:  Neg except as seen in HPI.    Observational Exam:   General Appearance: appears in good health, comfortable, appears stated age, no distress  Head: NCAT  Eyes: lids and lashes normal, EOM intact  Psychiatric: Normal affect, behavior, memory, thought content, judgement, and speech.  Neurology: normal without focal findings  mental status, speech  normal, alert and oriented x iii    Data Reviewed:   Previous test    Assessment/Plan:    ICD-10-CM    1. Hormone replacement therapy (HRT)  Z79.890 ESTRADIOL       2. Vitamin D  deficiency  E55.9 VITAMIN D  25 TOTAL      3. NASH (nonalcoholic steatohepatitis)  K75.81 VITAMIN D  25 TOTAL        Assessment & Plan  Menopausal Hormone Therapy  On Prometrium  for hormonal balance, sleep improvement, and management of vasomotor and mood symptoms. Sleeping better, no adverse effects.  - Continue Prometrium  100 mg oral every night.    Nonalcoholic Steatohepatitis with Cirrhosis  Cirrhosis with concerns about medication impact on liver  function.  - Ordered vitamin D  level check during next lab work.    History of Severe Cervical Dysplasia  Severe cervical dysplasia history with abnormal Pap smear at age 79. Pap smear frequency discussed.  - Scheduled annual exam in June, including breast and pelvic exam.  - Plan for Pap smear every two years unless recent abnormal result.       Follow Up:  Return for prn and annual.       This note was created with assistance from [employer] via capture of conversational audio. Consent was obtained from the patient and all parties present prior to recording.      Sherryle FORBES Risser, DO

## 2024-08-16 ENCOUNTER — Other Ambulatory Visit (INDEPENDENT_AMBULATORY_CARE_PROVIDER_SITE_OTHER): Payer: Self-pay | Admitting: FAMILY MEDICINE

## 2024-08-16 ENCOUNTER — Ambulatory Visit

## 2024-08-16 DIAGNOSIS — M79604 Pain in right leg: Secondary | ICD-10-CM

## 2024-08-16 DIAGNOSIS — M25571 Pain in right ankle and joints of right foot: Secondary | ICD-10-CM

## 2024-08-25 ENCOUNTER — Ambulatory Visit

## 2024-08-25 ENCOUNTER — Ambulatory Visit (HOSPITAL_COMMUNITY)

## 2024-09-12 ENCOUNTER — Ambulatory Visit (HOSPITAL_BASED_OUTPATIENT_CLINIC_OR_DEPARTMENT_OTHER): Payer: Self-pay | Admitting: Obstetrics & Gynecology

## 2024-09-19 ENCOUNTER — Ambulatory Visit (INDEPENDENT_AMBULATORY_CARE_PROVIDER_SITE_OTHER): Payer: Self-pay | Admitting: FAMILY MEDICINE

## 2024-10-18 ENCOUNTER — Ambulatory Visit (INDEPENDENT_AMBULATORY_CARE_PROVIDER_SITE_OTHER): Payer: Self-pay | Admitting: NURSE PRACTITIONER

## 2024-11-02 ENCOUNTER — Ambulatory Visit (INDEPENDENT_AMBULATORY_CARE_PROVIDER_SITE_OTHER): Admitting: INTERNAL MEDICINE

## 2024-12-11 ENCOUNTER — Ambulatory Visit (INDEPENDENT_AMBULATORY_CARE_PROVIDER_SITE_OTHER): Payer: Self-pay | Admitting: INTERNAL MEDICINE

## 2025-01-02 ENCOUNTER — Ambulatory Visit (HOSPITAL_BASED_OUTPATIENT_CLINIC_OR_DEPARTMENT_OTHER): Payer: Self-pay | Admitting: Obstetrics & Gynecology
